# Patient Record
Sex: Male | Born: 1935
Health system: Southern US, Community
[De-identification: ages and names within clinical notes are randomized; demographics above are authoritative.]

## PROBLEM LIST (undated history)

## (undated) DIAGNOSIS — H8110 Benign paroxysmal vertigo, unspecified ear: Secondary | ICD-10-CM

## (undated) DIAGNOSIS — Z8719 Personal history of other diseases of the digestive system: Secondary | ICD-10-CM

## (undated) DIAGNOSIS — C801 Malignant (primary) neoplasm, unspecified: Secondary | ICD-10-CM

## (undated) DIAGNOSIS — E785 Hyperlipidemia, unspecified: Secondary | ICD-10-CM

## (undated) DIAGNOSIS — I10 Essential (primary) hypertension: Secondary | ICD-10-CM

## (undated) DIAGNOSIS — E119 Type 2 diabetes mellitus without complications: Secondary | ICD-10-CM

## (undated) DIAGNOSIS — K5909 Other constipation: Secondary | ICD-10-CM

## (undated) HISTORY — DX: Other constipation: K59.09

## (undated) HISTORY — PX: EYE SURGERY: SHX253

## (undated) HISTORY — PX: COLONOSCOPY: SHX174

## (undated) HISTORY — DX: Essential (primary) hypertension: I10

## (undated) HISTORY — PX: ESOPHAGOGASTRODUODENOSCOPY: SHX1529

## (undated) HISTORY — DX: Benign paroxysmal vertigo, unspecified ear: H81.10

## (undated) HISTORY — PX: CATARACT EXTRACTION, BILATERAL: SHX1313

## (undated) HISTORY — DX: Hyperlipidemia, unspecified: E78.5

## (undated) HISTORY — DX: Type 2 diabetes mellitus without complications: E11.9

---

## 1957-01-25 DIAGNOSIS — J9819 Other pulmonary collapse: Secondary | ICD-10-CM

## 1957-01-25 HISTORY — DX: Other pulmonary collapse: J98.19

## 2004-08-27 ENCOUNTER — Ambulatory Visit: Payer: Self-pay | Admitting: Family Medicine

## 2007-02-16 ENCOUNTER — Ambulatory Visit: Payer: Self-pay | Admitting: Family Medicine

## 2007-08-08 ENCOUNTER — Emergency Department: Payer: Self-pay | Admitting: Emergency Medicine

## 2008-05-22 ENCOUNTER — Ambulatory Visit: Payer: Self-pay | Admitting: Family Medicine

## 2009-09-19 ENCOUNTER — Ambulatory Visit: Payer: Self-pay | Admitting: Family Medicine

## 2012-03-16 ENCOUNTER — Ambulatory Visit: Payer: Self-pay | Admitting: Nurse Practitioner

## 2012-05-17 ENCOUNTER — Ambulatory Visit: Payer: Self-pay | Admitting: Gastroenterology

## 2012-09-16 ENCOUNTER — Emergency Department: Payer: Self-pay | Admitting: Emergency Medicine

## 2012-09-16 LAB — URINALYSIS, COMPLETE
Bacteria: NONE SEEN
Ketone: NEGATIVE
Leukocyte Esterase: NEGATIVE
Nitrite: NEGATIVE
Ph: 7 (ref 4.5–8.0)
Protein: NEGATIVE
RBC,UR: 3 /HPF (ref 0–5)
Specific Gravity: 1.005 (ref 1.003–1.030)
Squamous Epithelial: NONE SEEN
WBC UR: <1 /HPF (ref 0–5)

## 2012-09-16 LAB — COMPREHENSIVE METABOLIC PANEL
Albumin: 4.1 g/dL (ref 3.4–5.0)
Anion Gap: 6 — ABNORMAL LOW (ref 7–16)
BUN: 17 mg/dL (ref 7–18)
Bilirubin,Total: 0.7 mg/dL (ref 0.2–1.0)
Calcium, Total: 9.6 mg/dL (ref 8.5–10.1)
Chloride: 99 mmol/L (ref 98–107)
Co2: 32 mmol/L (ref 21–32)
Creatinine: 1.1 mg/dL (ref 0.60–1.30)
EGFR (African American): >60
Glucose: 163 mg/dL — ABNORMAL HIGH (ref 65–99)
Osmolality: 279 (ref 275–301)
Potassium: 3.7 mmol/L (ref 3.5–5.1)
SGOT(AST): 19 U/L (ref 15–37)
Sodium: 137 mmol/L (ref 136–145)
Total Protein: 7.9 g/dL (ref 6.4–8.2)

## 2012-09-16 LAB — CBC
HCT: 45 % (ref 40.0–52.0)
HGB: 15.7 g/dL (ref 13.0–18.0)
MCHC: 35 g/dL (ref 32.0–36.0)
MCV: 95 fL (ref 80–100)
Platelet: 130 10*3/uL — ABNORMAL LOW (ref 150–440)
RDW: 13.2 % (ref 11.5–14.5)
WBC: 7.6 10*3/uL (ref 3.8–10.6)

## 2012-09-16 LAB — LIPASE, BLOOD: Lipase: 153 U/L (ref 73–393)

## 2014-05-06 ENCOUNTER — Emergency Department
Admit: 2014-05-06 | Disposition: A | Discharge status: Home / Self Care | Payer: Self-pay | Admitting: Emergency Medicine

## 2014-05-06 DIAGNOSIS — F419 Anxiety disorder, unspecified: Secondary | ICD-10-CM | POA: Diagnosis not present

## 2014-05-06 DIAGNOSIS — I10 Essential (primary) hypertension: Secondary | ICD-10-CM | POA: Diagnosis not present

## 2014-05-06 DIAGNOSIS — K5641 Fecal impaction: Secondary | ICD-10-CM | POA: Diagnosis not present

## 2014-05-06 DIAGNOSIS — Z87891 Personal history of nicotine dependence: Secondary | ICD-10-CM | POA: Diagnosis not present

## 2014-05-06 DIAGNOSIS — Z79899 Other long term (current) drug therapy: Secondary | ICD-10-CM | POA: Diagnosis not present

## 2014-05-06 DIAGNOSIS — R000 Tachycardia, unspecified: Secondary | ICD-10-CM | POA: Diagnosis not present

## 2014-05-14 DIAGNOSIS — K5900 Constipation, unspecified: Secondary | ICD-10-CM | POA: Diagnosis not present

## 2014-05-14 DIAGNOSIS — E785 Hyperlipidemia, unspecified: Secondary | ICD-10-CM | POA: Diagnosis not present

## 2014-05-14 DIAGNOSIS — E119 Type 2 diabetes mellitus without complications: Secondary | ICD-10-CM | POA: Diagnosis not present

## 2014-05-14 DIAGNOSIS — R42 Dizziness and giddiness: Secondary | ICD-10-CM | POA: Diagnosis not present

## 2014-05-14 DIAGNOSIS — I10 Essential (primary) hypertension: Secondary | ICD-10-CM | POA: Diagnosis not present

## 2014-09-19 DIAGNOSIS — E785 Hyperlipidemia, unspecified: Secondary | ICD-10-CM | POA: Diagnosis not present

## 2014-09-19 DIAGNOSIS — E119 Type 2 diabetes mellitus without complications: Secondary | ICD-10-CM | POA: Diagnosis not present

## 2014-09-19 DIAGNOSIS — K5909 Other constipation: Secondary | ICD-10-CM | POA: Diagnosis not present

## 2014-09-19 DIAGNOSIS — Z0000 Encounter for general adult medical examination without abnormal findings: Secondary | ICD-10-CM | POA: Diagnosis not present

## 2015-01-15 DIAGNOSIS — E119 Type 2 diabetes mellitus without complications: Secondary | ICD-10-CM | POA: Diagnosis not present

## 2015-01-15 DIAGNOSIS — E785 Hyperlipidemia, unspecified: Secondary | ICD-10-CM | POA: Diagnosis not present

## 2015-01-15 DIAGNOSIS — Z125 Encounter for screening for malignant neoplasm of prostate: Secondary | ICD-10-CM | POA: Diagnosis not present

## 2015-01-22 DIAGNOSIS — I10 Essential (primary) hypertension: Secondary | ICD-10-CM | POA: Diagnosis not present

## 2015-01-22 DIAGNOSIS — E119 Type 2 diabetes mellitus without complications: Secondary | ICD-10-CM | POA: Diagnosis not present

## 2015-01-22 DIAGNOSIS — E785 Hyperlipidemia, unspecified: Secondary | ICD-10-CM | POA: Diagnosis not present

## 2015-01-22 DIAGNOSIS — K5909 Other constipation: Secondary | ICD-10-CM | POA: Diagnosis not present

## 2015-10-09 ENCOUNTER — Encounter: Payer: Self-pay | Admitting: Physical Therapy

## 2015-10-09 ENCOUNTER — Ambulatory Visit: Payer: Medicare HMO | Attending: Family Medicine | Admitting: Physical Therapy

## 2015-10-09 DIAGNOSIS — E119 Type 2 diabetes mellitus without complications: Secondary | ICD-10-CM | POA: Insufficient documentation

## 2015-10-09 DIAGNOSIS — R42 Dizziness and giddiness: Secondary | ICD-10-CM | POA: Insufficient documentation
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
## 2015-10-16 ENCOUNTER — Encounter: Payer: Self-pay | Admitting: Physical Therapy

## 2015-10-16 ENCOUNTER — Ambulatory Visit: Payer: Medicare HMO | Admitting: Physical Therapy

## 2015-10-16 DIAGNOSIS — R42 Dizziness and giddiness: Secondary | ICD-10-CM | POA: Diagnosis not present
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
## 2015-10-17 ENCOUNTER — Ambulatory Visit: Payer: Medicare HMO | Admitting: Physical Therapy

## 2015-10-24 ENCOUNTER — Encounter: Payer: Self-pay | Admitting: Physical Therapy

## 2015-10-24 ENCOUNTER — Ambulatory Visit: Payer: Medicare HMO | Admitting: Physical Therapy

## 2015-10-24 DIAGNOSIS — R42 Dizziness and giddiness: Secondary | ICD-10-CM
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
## 2015-10-31 ENCOUNTER — Ambulatory Visit: Payer: Medicare HMO | Attending: Family Medicine | Admitting: Physical Therapy

## 2015-10-31 ENCOUNTER — Encounter: Payer: Self-pay | Admitting: Physical Therapy

## 2015-10-31 DIAGNOSIS — R42 Dizziness and giddiness: Secondary | ICD-10-CM | POA: Diagnosis not present
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
## 2015-11-04 ENCOUNTER — Encounter: Payer: Self-pay | Admitting: Physical Therapy

## 2015-11-04 ENCOUNTER — Ambulatory Visit: Payer: Medicare HMO | Admitting: Physical Therapy

## 2015-11-04 DIAGNOSIS — R42 Dizziness and giddiness: Secondary | ICD-10-CM | POA: Diagnosis not present
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
## 2015-11-11 ENCOUNTER — Encounter: Payer: Medicare HMO | Admitting: Physical Therapy

## 2015-11-18 ENCOUNTER — Encounter: Payer: Medicare HMO | Admitting: Physical Therapy

## 2015-11-25 ENCOUNTER — Encounter: Payer: Medicare HMO | Admitting: Physical Therapy

## 2018-08-22 ENCOUNTER — Emergency Department
Admission: EM | Admit: 2018-08-22 | Discharge: 2018-08-22 | Disposition: A | Discharge status: Home / Self Care | Payer: Medicare HMO | Attending: Emergency Medicine | Admitting: Emergency Medicine

## 2018-08-22 ENCOUNTER — Encounter: Payer: Self-pay | Admitting: *Deleted

## 2018-08-22 ENCOUNTER — Other Ambulatory Visit: Payer: Self-pay

## 2018-08-22 DIAGNOSIS — Z87891 Personal history of nicotine dependence: Secondary | ICD-10-CM | POA: Diagnosis not present

## 2018-08-22 DIAGNOSIS — I10 Essential (primary) hypertension: Secondary | ICD-10-CM | POA: Diagnosis not present

## 2018-08-22 DIAGNOSIS — Z7982 Long term (current) use of aspirin: Secondary | ICD-10-CM | POA: Insufficient documentation

## 2018-08-22 DIAGNOSIS — R410 Disorientation, unspecified: Secondary | ICD-10-CM | POA: Diagnosis not present

## 2018-08-22 DIAGNOSIS — Z79899 Other long term (current) drug therapy: Secondary | ICD-10-CM | POA: Insufficient documentation

## 2018-08-22 DIAGNOSIS — E119 Type 2 diabetes mellitus without complications: Secondary | ICD-10-CM | POA: Diagnosis not present

## 2018-08-22 LAB — URINALYSIS, COMPLETE (UACMP) WITH MICROSCOPIC
Bacteria, UA: NONE SEEN
Bilirubin Urine: NEGATIVE
Glucose, UA: NEGATIVE mg/dL
Ketones, ur: NEGATIVE mg/dL
Leukocytes,Ua: NEGATIVE
Nitrite: NEGATIVE
Protein, ur: NEGATIVE mg/dL
Specific Gravity, Urine: 1.012 (ref 1.005–1.030)
Squamous Epithelial / HPF: NONE SEEN (ref 0–5)
pH: 6 (ref 5.0–8.0)

## 2018-08-22 LAB — COMPREHENSIVE METABOLIC PANEL
ALT: 19 U/L (ref 0–44)
AST: 21 U/L (ref 15–41)
Albumin: 4.2 g/dL (ref 3.5–5.0)
Alkaline Phosphatase: 71 U/L (ref 38–126)
Anion gap: 11 (ref 5–15)
BUN: 26 mg/dL — ABNORMAL HIGH (ref 8–23)
CO2: 29 mmol/L (ref 22–32)
Calcium: 9.3 mg/dL (ref 8.9–10.3)
Chloride: 98 mmol/L (ref 98–111)
Creatinine, Ser: 1.47 mg/dL — ABNORMAL HIGH (ref 0.61–1.24)
GFR calc Af Amer: 50 mL/min — ABNORMAL LOW (ref >60)
GFR calc non Af Amer: 43 mL/min — ABNORMAL LOW (ref >60)
Glucose, Bld: 128 mg/dL — ABNORMAL HIGH (ref 70–99)
Potassium: 3.7 mmol/L (ref 3.5–5.1)
Sodium: 138 mmol/L (ref 135–145)
Total Bilirubin: 0.5 mg/dL (ref 0.3–1.2)
Total Protein: 8 g/dL (ref 6.5–8.1)

## 2018-08-22 LAB — CBC
HCT: 43.8 % (ref 39.0–52.0)
Hemoglobin: 14.7 g/dL (ref 13.0–17.0)
MCH: 31.3 pg (ref 26.0–34.0)
MCHC: 33.6 g/dL (ref 30.0–36.0)
MCV: 93.2 fL (ref 80.0–100.0)
Platelets: 177 10*3/uL (ref 150–400)
RBC: 4.7 MIL/uL (ref 4.22–5.81)
RDW: 12.1 % (ref 11.5–15.5)
WBC: 6.9 10*3/uL (ref 4.0–10.5)
nRBC: 0 % (ref 0.0–0.2)

## 2018-08-22 LAB — LIPASE, BLOOD: Lipase: 24 U/L (ref 11–51)

## 2018-08-22 NOTE — ED Notes (Signed)
md at bedside to discuss discharge plan and follow up care.

## 2018-08-22 NOTE — Discharge Instructions (Addendum)
Please seek medical attention for any high fevers, chest pain, shortness of breath, change in behavior, persistent vomiting, bloody stool or any other new or concerning symptoms.  

## 2018-08-22 NOTE — ED Triage Notes (Signed)
Pt to ED reporting increased fatigue over the past 2 weeks. Per pts wife he has been sleeping most day and not eating well. Pt has been more confused than normal and forgetting more. Pt reporting he has been feeling like it is hard to get his words out appropriately for the past 2 weeks.   Pt also reporting new onset of right sided abd and rib pain. No NVD but pt reports constipation. Pt took a suppository this morning and had a small BM.

## 2018-08-22 NOTE — ED Notes (Signed)
Patient bought into ed for change in mental status, reports patient not sleeping at night, moving a lot and saying things that are inappropriate. Patient denies pain of sob. Awaiting md eval and plan of care.

## 2018-08-22 NOTE — ED Provider Notes (Signed)
[facility] Emergency Department Provider Note   ____________________________________________   I have reviewed the triage vital signs and the nursing notes.   HISTORY  Chief Complaint Confusion   History limited by and level 5 caveat due to: Confusion   HPI Andres Hebert is a 85 y.o. male who presents to the emergency department today because of concerns for increasing confusion.  Wife states that the confusion has been going on for 4 weeks.  Is gradually gotten worse.  She states that he will recall things that have been a long time ago confused.  No trauma.  No fevers.  Did see primary care doctor yesterday with normal blood work.  Records reviewed. Per medical record review patient has a history of diabetes, hyperlipidemia, hypertension  Past Medical History:  Diagnosis Date  . BPV (benign positional vertigo)   . Chronic constipation   . Diabetes mellitus without complication ([place])   . Hyperlipidemia   . Hypertension     Patient Active Problem List   Diagnosis Date Noted  . Diabetes mellitus type 2, uncomplicated ([place]) 16/10/9602    History reviewed. No pertinent surgical history.  Prior to Admission medications   Medication Sig Start Date End Date Taking? Authorizing Provider  amLODipine (NORVASC) 5 MG tablet Take 5 mg by mouth daily.    [provider]  aspirin EC 81 MG tablet Take 81 mg by mouth daily.    [provider]  potassium chloride (K-DUR,KLOR-CON) 10 MEQ tablet Take 10 mEq by mouth 2 (two) times daily.    [provider]  pravastatin (PRAVACHOL) 40 MG tablet Take 40 mg by mouth daily.    [provider]  triamterene-hydrochlorothiazide (MAXZIDE-25) 37.5-25 MG tablet Take 1 tablet by mouth daily.    [provider]    Allergies Accupril [quinapril hcl]  History reviewed. No pertinent family history.  Social History Social History   Tobacco Use  . Smoking status: Former Smoker     Packs/day: 1.00    Years: 25.00    Pack years: 25.00    Types: Cigarettes    Quit date: 1976    Years since quitting: 44.6  . Smokeless tobacco: Never Used  Substance Use Topics  . Alcohol use: Not on file  . Drug use: Not on file    Review of Systems Unable to obtain reliable review of symptoms from patient given confusion. ____________________________________________   PHYSICAL EXAM:  VITAL SIGNS: ED Triage Vitals  Enc Vitals Group     BP 08/22/18 1840 (!) 142/67     Pulse Rate 08/22/18 1840 73     Resp 08/22/18 1840 16     Temp 08/22/18 1840 98.5 F (36.9 C)     Temp Source 08/22/18 1840 Oral     SpO2 08/22/18 1840 96 %     Weight 08/22/18 1837 168 lb (76.2 kg)     Height 08/22/18 1837 5\' 6"  (1.676 m)     Head Circumference --      Peak Flow --      Pain Score 08/22/18 1836 7   Constitutional: Awake and alert, not oriented to events or situation Eyes: Conjunctivae are normal.  ENT      Head: Normocephalic and atraumatic.      Nose: No congestion/rhinnorhea.      Mouth/Throat: Mucous membranes are moist.      Neck: No stridor. Hematological/Lymphatic/Immunilogical: No cervical lymphadenopathy. Cardiovascular: Normal rate, regular rhythm.  No murmurs, rubs, or gallops.  Respiratory:  Normal respiratory effort without tachypnea nor retractions. Breath sounds are clear and equal bilaterally. No wheezes/rales/rhonchi. Gastrointestinal: Soft and non tender. No rebound. No guarding.  Genitourinary: Deferred Musculoskeletal: Normal range of motion in all extremities. No lower extremity edema. Neurologic: Not oriented to events or situation.  Moving all extremities. Skin:  Skin is warm, dry and intact. No rash noted. Psychiatric: Mood and affect are normal. Speech and behavior are normal. Patient exhibits appropriate insight and judgment.  ____________________________________________    LABS (pertinent positives/negatives)  Lipase 24 CMP wnl except glu 128, bun  26, cr 1.47 CBC wbc 6.9, hgb 14.7,   ____________________________________________   EKG  I, Nance Pear, attending physician, personally viewed and interpreted this EKG  EKG Time: 1935 Rate: 68 Rhythm: normal sinus rhythm Axis: left axis deviation Intervals: qtc 433 QRS: narrow ST changes: no st elevation Impression: abnormal ekg   ____________________________________________    RADIOLOGY  None  ____________________________________________   PROCEDURES  Procedures  ____________________________________________   INITIAL IMPRESSION / ASSESSMENT AND PLAN / ED COURSE  Pertinent labs & imaging results that were available during my care of the patient were reviewed by me and considered in my medical decision making (see chart for details).   Patient presented to the emergency department today because of concerns for increased confusion over the past 4 weeks.  Blood work and urine without concerning findings here.  I did have a discussion with the wife.  At this point I would have some concerns for possible dementia over delirium.  And discuss importance of primary care follow-up.  ____________________________________________   FINAL CLINICAL IMPRESSION(S) / ED DIAGNOSES  Final diagnoses:  Confusion     Note: This dictation was prepared with Dragon dictation. Any transcriptional errors that result from this process are unintentional     Nance Pear, MD 08/22/18 2316

## 2018-08-23 LAB — TROPONIN I (HIGH SENSITIVITY): Troponin I (High Sensitivity): 5 ng/L (ref <18)

## 2018-10-05 ENCOUNTER — Encounter: Payer: Self-pay | Admitting: Emergency Medicine

## 2018-10-05 ENCOUNTER — Emergency Department: Payer: Medicare HMO

## 2018-10-05 ENCOUNTER — Other Ambulatory Visit: Payer: Self-pay

## 2018-10-05 ENCOUNTER — Inpatient Hospital Stay
Admission: EM | Admit: 2018-10-05 | Discharge: 2018-10-08 | DRG: 180 | Disposition: A | Discharge status: Home / Self Care | Payer: Medicare HMO | Attending: Internal Medicine | Admitting: Internal Medicine

## 2018-10-05 DIAGNOSIS — I10 Essential (primary) hypertension: Secondary | ICD-10-CM | POA: Diagnosis present

## 2018-10-05 DIAGNOSIS — Z9181 History of falling: Secondary | ICD-10-CM

## 2018-10-05 DIAGNOSIS — Z8639 Personal history of other endocrine, nutritional and metabolic disease: Secondary | ICD-10-CM | POA: Diagnosis not present

## 2018-10-05 DIAGNOSIS — R441 Visual hallucinations: Secondary | ICD-10-CM | POA: Diagnosis present

## 2018-10-05 DIAGNOSIS — Z20828 Contact with and (suspected) exposure to other viral communicable diseases: Secondary | ICD-10-CM | POA: Diagnosis present

## 2018-10-05 DIAGNOSIS — Z823 Family history of stroke: Secondary | ICD-10-CM | POA: Diagnosis not present

## 2018-10-05 DIAGNOSIS — J90 Pleural effusion, not elsewhere classified: Secondary | ICD-10-CM

## 2018-10-05 DIAGNOSIS — Z888 Allergy status to other drugs, medicaments and biological substances status: Secondary | ICD-10-CM | POA: Diagnosis not present

## 2018-10-05 DIAGNOSIS — J9601 Acute respiratory failure with hypoxia: Secondary | ICD-10-CM

## 2018-10-05 DIAGNOSIS — Z9889 Other specified postprocedural states: Secondary | ICD-10-CM

## 2018-10-05 DIAGNOSIS — Z7982 Long term (current) use of aspirin: Secondary | ICD-10-CM | POA: Diagnosis not present

## 2018-10-05 DIAGNOSIS — Z79899 Other long term (current) drug therapy: Secondary | ICD-10-CM | POA: Diagnosis not present

## 2018-10-05 DIAGNOSIS — Z66 Do not resuscitate: Secondary | ICD-10-CM | POA: Diagnosis not present

## 2018-10-05 DIAGNOSIS — E785 Hyperlipidemia, unspecified: Secondary | ICD-10-CM | POA: Diagnosis present

## 2018-10-05 DIAGNOSIS — H8110 Benign paroxysmal vertigo, unspecified ear: Secondary | ICD-10-CM | POA: Diagnosis present

## 2018-10-05 DIAGNOSIS — Z87891 Personal history of nicotine dependence: Secondary | ICD-10-CM

## 2018-10-05 DIAGNOSIS — C3431 Malignant neoplasm of lower lobe, right bronchus or lung: Principal | ICD-10-CM | POA: Diagnosis present

## 2018-10-05 DIAGNOSIS — J910 Malignant pleural effusion: Secondary | ICD-10-CM | POA: Diagnosis present

## 2018-10-05 DIAGNOSIS — C782 Secondary malignant neoplasm of pleura: Secondary | ICD-10-CM | POA: Diagnosis not present

## 2018-10-05 DIAGNOSIS — C7951 Secondary malignant neoplasm of bone: Secondary | ICD-10-CM | POA: Diagnosis present

## 2018-10-05 DIAGNOSIS — Z8042 Family history of malignant neoplasm of prostate: Secondary | ICD-10-CM

## 2018-10-05 DIAGNOSIS — K5909 Other constipation: Secondary | ICD-10-CM | POA: Diagnosis present

## 2018-10-05 DIAGNOSIS — I672 Cerebral atherosclerosis: Secondary | ICD-10-CM | POA: Diagnosis present

## 2018-10-05 DIAGNOSIS — E876 Hypokalemia: Secondary | ICD-10-CM | POA: Diagnosis not present

## 2018-10-05 LAB — BASIC METABOLIC PANEL
Anion gap: 12 (ref 5–15)
BUN: 24 mg/dL — ABNORMAL HIGH (ref 8–23)
CO2: 29 mmol/L (ref 22–32)
Calcium: 9.7 mg/dL (ref 8.9–10.3)
Chloride: 98 mmol/L (ref 98–111)
Creatinine, Ser: 1.16 mg/dL (ref 0.61–1.24)
GFR calc Af Amer: >60 mL/min (ref >60)
GFR calc non Af Amer: 57 mL/min — ABNORMAL LOW (ref >60)
Glucose, Bld: 156 mg/dL — ABNORMAL HIGH (ref 70–99)
Potassium: 3.7 mmol/L (ref 3.5–5.1)
Sodium: 139 mmol/L (ref 135–145)

## 2018-10-05 LAB — SARS CORONAVIRUS 2 BY RT PCR (HOSPITAL ORDER, PERFORMED IN ~~LOC~~ HOSPITAL LAB): SARS Coronavirus 2: NEGATIVE

## 2018-10-05 LAB — TROPONIN I (HIGH SENSITIVITY)
Troponin I (High Sensitivity): 6 ng/L (ref <18)
Troponin I (High Sensitivity): 6 ng/L (ref <18)

## 2018-10-05 LAB — CBC
HCT: 45.6 % (ref 39.0–52.0)
Hemoglobin: 15 g/dL (ref 13.0–17.0)
MCH: 30.7 pg (ref 26.0–34.0)
MCHC: 32.9 g/dL (ref 30.0–36.0)
MCV: 93.3 fL (ref 80.0–100.0)
Platelets: 180 10*3/uL (ref 150–400)
RBC: 4.89 MIL/uL (ref 4.22–5.81)
RDW: 12.2 % (ref 11.5–15.5)
WBC: 8.9 10*3/uL (ref 4.0–10.5)
nRBC: 0 % (ref 0.0–0.2)

## 2018-10-05 MED ORDER — ONDANSETRON HCL 4 MG PO TABS: 4.0000 mg | ORAL_TABLET | Freq: Four times a day (QID) | ORAL | Status: DC | PRN

## 2018-10-05 MED ORDER — ACETAMINOPHEN 650 MG RE SUPP: 650.0000 mg | Freq: Four times a day (QID) | RECTAL | Status: DC | PRN

## 2018-10-05 MED ORDER — SODIUM CHLORIDE 0.9% FLUSH: 3.0000 mL | Freq: Once | INTRAVENOUS | Status: DC

## 2018-10-05 MED ORDER — ONDANSETRON HCL 4 MG/2ML IJ SOLN: 4.0000 mg | Freq: Four times a day (QID) | INTRAMUSCULAR | Status: DC | PRN

## 2018-10-05 MED ORDER — FENTANYL CITRATE (PF) 100 MCG/2ML IJ SOLN
50.0000 ug | Freq: Once | INTRAMUSCULAR | Status: AC
  Administered 2018-10-05: 50 ug via INTRAVENOUS
  Filled 2018-10-05: qty 2

## 2018-10-05 MED ORDER — AMLODIPINE BESYLATE 5 MG PO TABS
5.0000 mg | ORAL_TABLET | Freq: Every day | ORAL | Status: DC
  Administered 2018-10-06 – 2018-10-08 (×3): 5 mg via ORAL
  Filled 2018-10-05 (×3): qty 1

## 2018-10-05 MED ORDER — KETOROLAC TROMETHAMINE 30 MG/ML IJ SOLN
15.0000 mg | Freq: Four times a day (QID) | INTRAMUSCULAR | Status: DC | PRN
  Administered 2018-10-06 – 2018-10-07 (×3): 15 mg via INTRAVENOUS
  Filled 2018-10-05 (×3): qty 1

## 2018-10-05 MED ORDER — PRAVASTATIN SODIUM 20 MG PO TABS
40.0000 mg | ORAL_TABLET | Freq: Every day | ORAL | Status: DC
  Administered 2018-10-06 – 2018-10-07 (×2): 40 mg via ORAL
  Filled 2018-10-05 (×2): qty 2

## 2018-10-05 MED ORDER — MORPHINE SULFATE (PF) 2 MG/ML IV SOLN
2.0000 mg | INTRAVENOUS | Status: DC | PRN
  Administered 2018-10-06 – 2018-10-08 (×5): 2 mg via INTRAVENOUS
  Filled 2018-10-05 (×5): qty 1

## 2018-10-05 MED ORDER — IOHEXOL 350 MG/ML SOLN
75.0000 mL | Freq: Once | INTRAVENOUS | Status: AC | PRN
  Administered 2018-10-05: 21:00:00 75 mL via INTRAVENOUS

## 2018-10-05 MED ORDER — ENOXAPARIN SODIUM 40 MG/0.4ML ~~LOC~~ SOLN
40.0000 mg | SUBCUTANEOUS | Status: DC
  Administered 2018-10-06 – 2018-10-07 (×2): 40 mg via SUBCUTANEOUS
  Filled 2018-10-05 (×2): qty 0.4

## 2018-10-05 MED ORDER — ACETAMINOPHEN 325 MG PO TABS: 650.0000 mg | ORAL_TABLET | Freq: Four times a day (QID) | ORAL | Status: DC | PRN

## 2018-10-05 MED ORDER — POTASSIUM CHLORIDE CRYS ER 10 MEQ PO TBCR
10.0000 meq | EXTENDED_RELEASE_TABLET | Freq: Two times a day (BID) | ORAL | Status: DC
  Administered 2018-10-06 – 2018-10-08 (×5): 10 meq via ORAL
  Filled 2018-10-05 (×5): qty 1

## 2018-10-05 NOTE — ED Notes (Signed)
primedoc in with pt for admission.  

## 2018-10-05 NOTE — ED Notes (Signed)
Family in with pt.  Pt reports falling off the porch 2 weeks ago and falling on to the ground.  Pt has pain in right rib area.  Pt states worse sx for past week.  Pt has sob.  Pt placed on 4 liters oxygen nasal cannula.  Iv started and pain meds given.

## 2018-10-05 NOTE — ED Provider Notes (Signed)
[facility] Emergency Department Provider Note  ____________________________________________   First MD Initiated Contact with Patient 10/05/18 1955     (approximate)  I have reviewed the triage vital signs and the nursing notes.   HISTORY  Chief Complaint SOB   HPI Andres Hebert is a 85 y.o. male with diabetes, HTN, HLD presents with shortness of breath.  Patient had a fall 2 weeks ago that was mechanical in nature.  Patient denies any pain initially.  However a few days ago he developed pain on his right side of his flank region and increased shortness of breath.  Shortness of breath, constant, nothing makes it better, nothing makes it worse.          Past Medical History:  Diagnosis Date  . BPV (benign positional vertigo)   . Chronic constipation   . Diabetes mellitus without complication ([place])   . Hyperlipidemia   . Hypertension     Patient Active Problem List   Diagnosis Date Noted  . Diabetes mellitus type 2, uncomplicated ([place]) 23/55/7322    History reviewed. No pertinent surgical history.  Prior to Admission medications   Medication Sig Start Date End Date Taking? Authorizing Provider  amLODipine (NORVASC) 5 MG tablet Take 5 mg by mouth daily.    [provider]  aspirin EC 81 MG tablet Take 81 mg by mouth daily.    [provider]  potassium chloride (K-DUR,KLOR-CON) 10 MEQ tablet Take 10 mEq by mouth 2 (two) times daily.    [provider]  pravastatin (PRAVACHOL) 40 MG tablet Take 40 mg by mouth daily.    [provider]  triamterene-hydrochlorothiazide (MAXZIDE-25) 37.5-25 MG tablet Take 1 tablet by mouth daily.    [provider]    Allergies Accupril [quinapril hcl]  No family history on file.  Social History Social History   Tobacco Use  . Smoking status: Former Smoker    Packs/day: 1.00    Years: 25.00    Pack years: 25.00    Types: Cigarettes    Quit date: 1976   Years since quitting: 44.7  . Smokeless tobacco: Never Used  Substance Use Topics  . Alcohol use: Not on file  . Drug use: Not on file      Review of Systems Constitutional: No fever/chills Eyes: No visual changes. ENT: No sore throat. Cardiovascular: No chest pain Respiratory: Positive for SOB, positive right rib pain Gastrointestinal: No abdominal pain.  No nausea, no vomiting.  No diarrhea.  No constipation. Genitourinary: Negative for dysuria. Musculoskeletal: Negative for back pain. Skin: Negative for rash. Neurological: Negative for headaches, focal weakness or numbness. All other ROS negative ____________________________________________   PHYSICAL EXAM:  VITAL SIGNS: ED Triage Vitals  Enc Vitals Group     BP 10/05/18 1816 123/81     Pulse Rate 10/05/18 1816 98     Resp 10/05/18 1816 (!) 24     Temp 10/05/18 1816 98.5 F (36.9 C)     Temp Source 10/05/18 1816 Oral     SpO2 10/05/18 1816 92 %     Weight --      Height 10/05/18 1816 5\' 6"  (1.676 m)     Head Circumference --      Peak Flow --      Pain Score 10/05/18 1819 8     Pain Loc --      Pain Edu? --      Excl. in [place]? --     Constitutional:  Alert and oriented. Well appearing and in no acute distress. Eyes: Conjunctivae are normal. EOMI. Head: Atraumatic. Nose: No congestion/rhinnorhea. Mouth/Throat: Mucous membranes are moist.   Neck: No stridor. Trachea Midline. FROM Cardiovascular: Normal rate, regular rhythm. Grossly normal heart sounds.  Good peripheral circulation. Respiratory: Increased work of breathing, bilateral lung sounds Gastrointestinal: Soft and nontender. No distention. No abdominal bruits.  Musculoskeletal: No lower extremity tenderness nor edema.  No joint effusions. Neurologic:  Normal speech and language. No gross focal neurologic deficits are appreciated.  Skin:  Skin is warm, dry and intact. No rash noted. Psychiatric: Mood and affect are normal. Speech and behavior are normal.  GU: Deferred   ____________________________________________   LABS (all labs ordered are listed, but only abnormal results are displayed)  Labs Reviewed  BASIC METABOLIC PANEL - Abnormal; Notable for the following components:      Result Value   Glucose, Bld 156 (*)    BUN 24 (*)    GFR calc non Af Amer 57 (*)    All other components within normal limits  SARS CORONAVIRUS 2 (HOSPITAL ORDER, [facility])  CBC  TROPONIN I (HIGH SENSITIVITY)  TROPONIN I (HIGH SENSITIVITY)   ____________________________________________   ED ECG REPORT I, Vanessa [place], the attending physician, personally viewed and interpreted this ECG.  EKG is normal sinus rate of 98, little ST elevation in aVR and V1, this looks new from prior, no t wave inversion, normal intervals. ____________________________________________  [place], personally viewed and evaluated these images (plain radiographs) as part of my medical decision making, as well as reviewing the written report by the radiologist.  ED MD interpretation: Small pleural effusion on the right  Official radiology report(s): Dg Chest 2 View  Result Date: 10/05/2018 CLINICAL DATA:  Shortness of breath EXAM: CHEST - 2 VIEW COMPARISON:  None. FINDINGS: Small right pleural effusion with lateral loculated component versus pleural nodularity. The lungs are hyperexpanded with increased interstitial markings. Heart size is normal. There is calcific aortic atherosclerosis. IMPRESSION: Small right pleural effusion with lateral loculated component versus pleural nodularity. In the setting of recent trauma, this may indicate nondisplaced or minimally displaced rib fractures with a small hemothorax. CT may be helpful for further investigation. Electronically Signed   By: Ulyses Jarred M.D.   On: 10/05/2018 19:10    ____________________________________________   PROCEDURES  Procedure(s) performed (including Critical  Care):  .Critical Care Performed by: Vanessa [place], MD Authorized by: Vanessa , MD   Critical care provider statement:    Critical care time (minutes):  30   Critical care was necessary to treat or prevent imminent or life-threatening deterioration of the following conditions:  Respiratory failure   Critical care was time spent personally by me on the following activities:  Discussions with consultants, evaluation of patient's response to treatment, examination of patient, ordering and performing treatments and interventions, ordering and review of laboratory studies, ordering and review of radiographic studies, pulse oximetry, re-evaluation of patient's condition, obtaining history from patient or surrogate and review of old charts     ____________________________________________   INITIAL IMPRESSION / ASSESSMENT AND PLAN / ED COURSE   Andres Hebert was evaluated in Emergency Department on 10/05/2018 for the symptoms described in the history of present illness. He was evaluated in the context of the global COVID-19 pandemic, which necessitated consideration that the patient might be at risk for infection with the SARS-CoV-2 virus that causes COVID-19. Institutional protocols  and algorithms that pertain to the evaluation of patients at risk for COVID-19 are in a state of rapid change based on information released by regulatory bodies including the CDC and federal and state organizations. These policies and algorithms were followed during the patient's care in the ED.     Pt presents with SOB. Differential includes: PNA-will get xray to evaluation Anemia-CBC to evaluate ACS- will get trops Arrhythmia-Will get EKG and keep on monitor.  COVID- will get testing per algorithm.  Given patient's new pleural effusion will get CT scan to evaluate for pulmonary embolism, rib fractures, hemothorax.  CT is concerning for new right lower lobe mass consistent with lung cancer with a large  right pleural effusion there is some destructive lytic lesions on the ribs.  No evidence of pulmonary embolism.  Looks more comfortable with the pain medication.  Patient is on 2 L.  We will discussed the hospital team for admission.    ____________________________________________   FINAL CLINICAL IMPRESSION(S) / ED DIAGNOSES   Final diagnoses:  Acute respiratory failure with hypoxia ([place])     MEDICATIONS GIVEN DURING THIS VISIT:  Medications  sodium chloride flush (NS) 0.9 % injection 3 mL (has no administration in time range)  fentaNYL (SUBLIMAZE) injection 50 mcg (50 mcg Intravenous Given 10/05/18 2044)  iohexol (OMNIPAQUE) 350 MG/ML injection 75 mL (75 mLs Intravenous Contrast Given 10/05/18 2052)     ED Discharge Orders    None       Note:  This document was prepared using Dragon voice recognition software and may include unintentional dictation errors.   Vanessa [place], MD 10/05/18 2124

## 2018-10-05 NOTE — Progress Notes (Signed)
   [place] [facility] Day: 0 days Andres Hebert is a 85 y.o. male  With known history of essential hypertension, hyperlipidemia, diabetes, chronic constipation, history of benign positional vertigo who presents to the hospital complaining of right-sided chest pain along with some shortness of breath.  Patient CT chest was highly suggestive of a lung mass with a right-sided pleural effusion and lytic rib lesions which is consistent with suspected malignancy.  Given patient's advanced age and multiple comorbidities I had a discussion with the patient and patient's wife at bedside about difference between full code and DNR.  Patient would like to further discuss with his wife regarding his CODE STATUS.  For now patient remains a full code.  Patient would benefit from palliative care consult.  Advance care planning discussed with patient  with additional Family at bedside. All questions in regards to overall condition and expected prognosis answered. The decision was made to continue current code status  CODE STATUS: full Time spent: 16 minutes

## 2018-10-05 NOTE — ED Notes (Signed)
Patient transported to CT 

## 2018-10-05 NOTE — H&P (Signed)
[facility] [place] NAME: Andres Hebert    MR#:  [id number]  DATE OF BIRTH:  12-09-1931  DATE OF ADMISSION:  10/05/2018  PRIMARY CARE PHYSICIAN: [name], MD   REQUESTING/REFERRING PHYSICIAN: Dr. Marjean Donna  CHIEF COMPLAINT:  No chief complaint on file. Right sided pleuritic Chest pain. Shortness of breath.   HISTORY OF PRESENT ILLNESS:  Andres Hebert  is a 85 y.o. male with a known history of essential hypertension, hyperlipidemia, diabetes, chronic constipation, history of benign positional vertigo who presents to the hospital complaining of right-sided chest pain along with some shortness of breath.  Patient says she has developed worsening pain on the right side of his chest over the past few days to a week progressively getting worse.  He also admits to worsening exertional dyspnea.  He also admits to a 20 pound weight loss over the past 3 months and admits to intermittent hemoptysis.  He presents to the hospital as his symptoms were not improving and underwent a CT of the chest which showed a right-sided lung mass, with mets to the ribs with some lytic lesions and also noted to have extensive right-sided pleural effusion. Patient denies any fevers, chills, nausea, vomiting, abdominal pain, melena, hematochezia dysuria hematuria or any other associated symptoms.  Given his abnormal CT scan findings hospital services were contacted for admission.  PAST MEDICAL HISTORY:   Past Medical History:  Diagnosis Date  . BPV (benign positional vertigo)   . Chronic constipation   . Diabetes mellitus without complication ([place])   . Hyperlipidemia   . Hypertension     PAST SURGICAL HISTORY:  History reviewed. No pertinent surgical history.  SOCIAL HISTORY:   Social History   Tobacco Use  . Smoking status: Former Smoker    Packs/day: 1.00    Years: 25.00    Pack years: 25.00    Types: Cigarettes    Quit date: 1976    Years since  quitting: 44.7  . Smokeless tobacco: Never Used  Substance Use Topics  . Alcohol use: Not Currently    FAMILY HISTORY:   Family History  Problem Relation Age of Onset  . Stroke Mother   . Prostate cancer Father     DRUG ALLERGIES:   Allergies  Allergen Reactions  . Accupril [Quinapril Hcl] Other (See Comments)    Reaction:dizziness per MR    REVIEW OF SYSTEMS:   Review of Systems  Constitutional: Positive for weight loss (20 lbs. ). Negative for fever.  HENT: Negative for congestion, nosebleeds and tinnitus.   Eyes: Negative for blurred vision, double vision and redness.  Respiratory: Positive for shortness of breath. Negative for cough and hemoptysis.   Cardiovascular: Positive for chest pain (Pleuritic on right side. ). Negative for orthopnea, leg swelling and PND.  Gastrointestinal: Negative for abdominal pain, diarrhea, melena, nausea and vomiting.  Genitourinary: Negative for dysuria, hematuria and urgency.  Musculoskeletal: Negative for falls and joint pain.  Neurological: Negative for dizziness, tingling, sensory change, focal weakness, seizures, weakness and headaches.  Endo/Heme/Allergies: Negative for polydipsia. Does not bruise/bleed easily.  Psychiatric/Behavioral: Negative for depression and memory loss. The patient is not nervous/anxious.     MEDICATIONS AT HOME:   Prior to Admission medications   Medication Sig Start Date End Date Taking? Authorizing Provider  amLODipine (NORVASC) 5 MG tablet Take 5 mg by mouth daily.    [provider]  aspirin EC 81 MG tablet Take 81 mg by mouth  daily.    [provider]  potassium chloride (K-DUR,KLOR-CON) 10 MEQ tablet Take 10 mEq by mouth 2 (two) times daily.    [provider]  pravastatin (PRAVACHOL) 40 MG tablet Take 40 mg by mouth daily.    [provider]  triamterene-hydrochlorothiazide (MAXZIDE-25) 37.5-25 MG tablet Take 1 tablet by mouth daily.    [provider]       VITAL SIGNS:  Blood pressure (!) 122/39, pulse 88, temperature 98.5 F (36.9 C), temperature source Oral, resp. rate (!) 24, height 5\' 6"  (1.676 m), SpO2 98 %.  PHYSICAL EXAMINATION:  Physical Exam  GENERAL:  85 y.o.-year-old patient lying in the bed in no acute distress.  EYES: Pupils equal, round, reactive to light and accommodation. No scleral icterus. Extraocular muscles intact.  HEENT: Head atraumatic, normocephalic. Oropharynx and nasopharynx clear. No oropharyngeal erythema, moist oral mucosa  NECK:  Supple, no jugular venous distention. No thyroid enlargement, no tenderness.  LUNGS: Prolonged inspiratory and expiratory phase, poor air entry on the right side, no rails, minimal end expiratory wheezing bilaterally, negative use of accessory muscles. CARDIOVASCULAR: S1, S2 RRR. No murmurs, rubs, gallops, clicks.  ABDOMEN: Soft, nontender, nondistended. Bowel sounds present. No organomegaly or mass.  EXTREMITIES: No pedal edema, cyanosis, or clubbing. + 2 pedal & radial pulses b/l.   NEUROLOGIC: Cranial nerves II through XII are intact. No focal Motor or sensory deficits appreciated b/l. Globally weak.  PSYCHIATRIC: The patient is alert and oriented x 3.  SKIN: No obvious rash, lesion, or ulcer.   LABORATORY PANEL:   CBC Recent Labs  Lab 10/05/18 1821  WBC 8.9  HGB 15.0  HCT 45.6  PLT 180   ------------------------------------------------------------------------------------------------------------------  Chemistries  Recent Labs  Lab 10/05/18 1821  NA 139  K 3.7  CL 98  CO2 29  GLUCOSE 156*  BUN 24*  CREATININE 1.16  CALCIUM 9.7   ------------------------------------------------------------------------------------------------------------------  Cardiac Enzymes No results for input(s): TROPONINI in the last 168 hours. ------------------------------------------------------------------------------------------------------------------  RADIOLOGY:  Dg Chest 2  View  Result Date: 10/05/2018 CLINICAL DATA:  Shortness of breath EXAM: CHEST - 2 VIEW COMPARISON:  None. FINDINGS: Small right pleural effusion with lateral loculated component versus pleural nodularity. The lungs are hyperexpanded with increased interstitial markings. Heart size is normal. There is calcific aortic atherosclerosis. IMPRESSION: Small right pleural effusion with lateral loculated component versus pleural nodularity. In the setting of recent trauma, this may indicate nondisplaced or minimally displaced rib fractures with a small hemothorax. CT may be helpful for further investigation. Electronically Signed   By: Ulyses Jarred M.D.   On: 10/05/2018 19:10   Ct Angio Chest Pe W And/or Wo Contrast  Result Date: 10/05/2018 CLINICAL DATA:  Chest pain EXAM: CT ANGIOGRAPHY CHEST WITH CONTRAST TECHNIQUE: Multidetector CT imaging of the chest was performed using the standard protocol during bolus administration of intravenous contrast. Multiplanar CT image reconstructions and MIPs were obtained to evaluate the vascular anatomy. CONTRAST:  70mL OMNIPAQUE IOHEXOL 350 MG/ML SOLN COMPARISON:  Chest x-ray today FINDINGS: Cardiovascular: No filling defects in the pulmonary arteries to suggest pulmonary emboli. Heart is normal size. Aorta is normal caliber. Coronary artery and aortic atherosclerosis. Mediastinum/Nodes: Enlarged right hilar lymph nodes with index node having a short axis diameter of 10 mm. No mediastinal, left hilar or axillary adenopathy. Lungs/Pleura: There is concern for right lower lobe mass measuring approximately 8.4 x 6.9 cm. Associated large right joint effusion with pleural nodules concerning for metastatic pleural involvement. Compressive atelectasis in  the right lower lobe. No focal opacity on the left. Moderate to advanced emphysema. Upper Abdomen: Imaging into the upper abdomen shows no acute findings. Musculoskeletal: Destructive mass noted in the lateral right 7th rib. Bone  destruction also in the lateral right 8th rib. Diffuse degenerative changes in the thoracic spine. Review of the MIP images confirms the above findings. IMPRESSION: Large right lower lobe mass measures up to 8.4 cm most compatible with lung cancer. This is associated with numerous pleural nodules, large right pleural effusion compatible with pleural metastatic disease. There also bony destructive lytic lesions in the right lateral 7th and 8th ribs. Moderate to advanced emphysema. No evidence of pulmonary embolus. Aortic Atherosclerosis (ICD10-I70.0) and Emphysema (ICD10-J43.9). Electronically Signed   By: Rolm Baptise M.D.   On: 10/05/2018 21:10     IMPRESSION AND PLAN:   85 y.o. male with a known history of essential hypertension, hyperlipidemia, diabetes, chronic constipation, history of benign positional vertigo who presents to the hospital complaining of right-sided chest pain along with some shortness of breath.  1.  Acute respiratory failure with hypoxia-secondary to the lung mass with a large right-sided pleural effusion is seen. - Continue O2 supplementation, will plan for a right-sided therapeutic and diagnostic thoracentesis for tomorrow.  2.  Pleural effusion-source of patient's worsening shortness of breath and hypoxia. - Suspect this is a malignant pleural effusion given the patient's right lung mass and lytic lesions on the right lateral ribs. -I have ordered a diagnostic and therapeutic thoracentesis for tomorrow.  3.  Chest pain- this is on the right side and pleuritic in nature secondary to the metastatic rib lesions. -Continue supportive care with pain control with IV morphine, IV Toradol.  4.  Lung mass- noted on the CT chest as mentioned.  Suspicious to be lung cancer given patient's intermittent hemoptysis weight loss and history of tobacco abuse. - Await cytology from the right-sided pleural effusion.  We will also get a oncology consult and sent message to Dr. Mike Gip by  Raeanne Gathers.  5.  Essential hypertension-continue Norvasc  6.  Hyperlipidemia-continue Pravachol.    All the records are reviewed and case discussed with ED provider. Management plans discussed with the patient, family and they are in agreement.  CODE STATUS: Full code  TOTAL TIME TAKING CARE OF THIS PATIENT: 45 minutes.    Henreitta Leber M.D on 10/05/2018 at 9:56 PM  Between 7am to 6pm - Pager - [phone]  After 6pm go to [email or web address] - password [email or web address] [facility]  [facility]  Office  [phone]  CC: Primary care physician; [name], MD

## 2018-10-05 NOTE — ED Triage Notes (Signed)
PT c/o intermit SOB x2-3wks, states he had mechanical fall x2wks ago , c/o RT sided rib cage pain. Pt c/o pain with breathing. Pt 92% on RA, placed on 2L for comfort

## 2018-10-05 NOTE — ED Notes (Signed)
Report called to brandy rn floor nurse

## 2018-10-05 NOTE — ED Notes (Signed)
ED TO INPATIENT HANDOFF REPORT  ED Nurse Name and Phone #: Dineen Conradt  S Name/Age/Gender Andres Hebert 85 y.o. male Room/Bed: ED02A/ED02A  Code Status   Code Status: Not on file  Home/SNF/Other Home Patient oriented to: self, place, time and situation Is this baseline? Yes   Triage Complete: Triage complete  Chief Complaint Shob   Triage Note PT c/o intermit SOB x2-3wks, states he had mechanical fall x2wks ago , c/o RT sided rib cage pain. Pt c/o pain with breathing. Pt 92% on RA, placed on 2L for comfort    Allergies Allergies  Allergen Reactions  . Accupril [Quinapril Hcl] Other (See Comments)    Reaction:dizziness per MR    Level of Care/Admitting Diagnosis ED Disposition    ED Disposition Condition [facility]: [facility] [[id number]]  Level of Care: Med-Surg [16]  Covid Evaluation: Asymptomatic Screening Protocol (No Symptoms)  Diagnosis: Pleural effusion [242230]  Admitting Physician: Henreitta Leber [[id number]]  Attending Physician: Henreitta Leber [[id number]]  Estimated length of stay: past midnight tomorrow  Certification:: I certify this patient will need inpatient services for at least 2 midnights  PT Class (Do Not Modify): Inpatient [101]  PT Acc Code (Do Not Modify): Private [1]       B Medical/Surgery History Past Medical History:  Diagnosis Date  . BPV (benign positional vertigo)   . Chronic constipation   . Diabetes mellitus without complication ([place])   . Hyperlipidemia   . Hypertension    History reviewed. No pertinent surgical history.   A IV Location/Drains/Wounds Patient Lines/Drains/Airways Status   Active Line/Drains/Airways    Name:   Placement date:   Placement time:   Site:   Days:   Peripheral IV 10/05/18 Right Forearm   10/05/18    2041    Forearm   less than 1          Intake/Output Last 24 hours No intake or output data in the 24 hours ending 10/05/18 2310  Labs/Imaging Results for  orders placed or performed during the hospital encounter of 10/05/18 (from the past 48 hour(s))  Basic metabolic panel     Status: Abnormal   Collection Time: 10/05/18  6:21 PM  Result Value Ref Range   Sodium 139 135 - 145 mmol/L   Potassium 3.7 3.5 - 5.1 mmol/L   Chloride 98 98 - 111 mmol/L   CO2 29 22 - 32 mmol/L   Glucose, Bld 156 (H) 70 - 99 mg/dL   BUN 24 (H) 8 - 23 mg/dL   Creatinine, Ser 1.16 0.61 - 1.24 mg/dL   Calcium 9.7 8.9 - 10.3 mg/dL   GFR calc non Af Amer 57 (L) >60 mL/min   GFR calc Af Amer >60 >60 mL/min   Anion gap 12 5 - 15    Comment: Performed at [facility], [address] [place], [place] [address]  CBC     Status: None   Collection Time: 10/05/18  6:21 PM  Result Value Ref Range   WBC 8.9 4.0 - 10.5 K/uL   RBC 4.89 4.22 - 5.81 MIL/uL   Hemoglobin 15.0 13.0 - 17.0 g/dL   HCT 45.6 39.0 - 52.0 %   MCV 93.3 80.0 - 100.0 fL   MCH 30.7 26.0 - 34.0 pg   MCHC 32.9 30.0 - 36.0 g/dL   RDW 12.2 11.5 - 15.5 %   Platelets 180 150 - 400 K/uL   nRBC 0.0 0.0 -  0.2 %    Comment: Performed at [facility], [place], [place] [address]  Troponin I (High Sensitivity)     Status: None   Collection Time: 10/05/18  6:21 PM  Result Value Ref Range   Troponin I (High Sensitivity) 6 <18 ng/L    Comment: (NOTE) Elevated high sensitivity troponin I (hsTnI) values and significant  changes across serial measurements may suggest ACS but many other  chronic and acute conditions are known to elevate hsTnI results.  Refer to the "Links" section for chest pain algorithms and additional  guidance. Performed at [facility], [address] [place], [place] [address]   SARS Coronavirus 2 [facility] order, Performed in [facility] hospital lab) Nasopharyngeal Nasopharyngeal Swab     Status: None   Collection Time: 10/05/18  7:59 PM   Specimen: Nasopharyngeal Swab  Result Value Ref Range   SARS Coronavirus 2 NEGATIVE NEGATIVE    Comment:  (NOTE) If result is NEGATIVE SARS-CoV-2 target nucleic acids are NOT DETECTED. The SARS-CoV-2 RNA is generally detectable in upper and lower  respiratory specimens during the acute phase of infection. The lowest  concentration of SARS-CoV-2 viral copies this assay can detect is 250  copies / mL. A negative result does not preclude SARS-CoV-2 infection  and should not be used as the sole basis for treatment or other  patient management decisions.  A negative result may occur with  improper specimen collection / handling, submission of specimen other  than nasopharyngeal swab, presence of viral mutation(s) within the  areas targeted by this assay, and inadequate number of viral copies  (<250 copies / mL). A negative result must be combined with clinical  observations, patient history, and epidemiological information. If result is POSITIVE SARS-CoV-2 target nucleic acids are DETECTED. The SARS-CoV-2 RNA is generally detectable in upper and lower  respiratory specimens dur ing the acute phase of infection.  Positive  results are indicative of active infection with SARS-CoV-2.  Clinical  correlation with patient history and other diagnostic information is  necessary to determine patient infection status.  Positive results do  not rule out bacterial infection or co-infection with other viruses. If result is PRESUMPTIVE POSTIVE SARS-CoV-2 nucleic acids MAY BE PRESENT.   A presumptive positive result was obtained on the submitted specimen  and confirmed on repeat testing.  While 2019 novel coronavirus  (SARS-CoV-2) nucleic acids may be present in the submitted sample  additional confirmatory testing may be necessary for epidemiological  and / or clinical management purposes  to differentiate between  SARS-CoV-2 and other Sarbecovirus currently known to infect humans.  If clinically indicated additional testing with an alternate test  methodology [phone]) is advised. The SARS-CoV-2 RNA is  generally  detectable in upper and lower respiratory sp ecimens during the acute  phase of infection. The expected result is Negative. Fact Sheet for Patients:  StrictlyIdeas.no Fact Sheet for Healthcare Providers: BankingDealers.co.za This test is not yet approved or cleared by the [place] FDA and has been authorized for detection and/or diagnosis of SARS-CoV-2 by FDA under an Emergency Use Authorization (EUA).  This EUA will remain in effect (meaning this test can be used) for the duration of the COVID-19 declaration under Section 564(b)(1) of the Act, 21 U.S.C. section 360bbb-3(b)(1), unless the authorization is terminated or revoked sooner. Performed at [facility], [address], [place], [place] [address]   Troponin I (High Sensitivity)     Status: None   Collection Time:  10/05/18  8:21 PM  Result Value Ref Range   Troponin I (High Sensitivity) 6 <18 ng/L    Comment: (NOTE) Elevated high sensitivity troponin I (hsTnI) values and significant  changes across serial measurements may suggest ACS but many other  chronic and acute conditions are known to elevate hsTnI results.  Refer to the "Links" section for chest pain algorithms and additional  guidance. Performed at [facility], [address] [place], [place] [address]    Dg Chest 2 View  Result Date: 10/05/2018 CLINICAL DATA:  Shortness of breath EXAM: CHEST - 2 VIEW COMPARISON:  None. FINDINGS: Small right pleural effusion with lateral loculated component versus pleural nodularity. The lungs are hyperexpanded with increased interstitial markings. Heart size is normal. There is calcific aortic atherosclerosis. IMPRESSION: Small right pleural effusion with lateral loculated component versus pleural nodularity. In the setting of recent trauma, this may indicate nondisplaced or minimally displaced rib fractures with a small hemothorax. CT may be helpful  for further investigation. Electronically Signed   By: Ulyses Jarred M.D.   On: 10/05/2018 19:10   Ct Angio Chest Pe W And/or Wo Contrast  Result Date: 10/05/2018 CLINICAL DATA:  Chest pain EXAM: CT ANGIOGRAPHY CHEST WITH CONTRAST TECHNIQUE: Multidetector CT imaging of the chest was performed using the standard protocol during bolus administration of intravenous contrast. Multiplanar CT image reconstructions and MIPs were obtained to evaluate the vascular anatomy. CONTRAST:  17mL OMNIPAQUE IOHEXOL 350 MG/ML SOLN COMPARISON:  Chest x-ray today FINDINGS: Cardiovascular: No filling defects in the pulmonary arteries to suggest pulmonary emboli. Heart is normal size. Aorta is normal caliber. Coronary artery and aortic atherosclerosis. Mediastinum/Nodes: Enlarged right hilar lymph nodes with index node having a short axis diameter of 10 mm. No mediastinal, left hilar or axillary adenopathy. Lungs/Pleura: There is concern for right lower lobe mass measuring approximately 8.4 x 6.9 cm. Associated large right joint effusion with pleural nodules concerning for metastatic pleural involvement. Compressive atelectasis in the right lower lobe. No focal opacity on the left. Moderate to advanced emphysema. Upper Abdomen: Imaging into the upper abdomen shows no acute findings. Musculoskeletal: Destructive mass noted in the lateral right 7th rib. Bone destruction also in the lateral right 8th rib. Diffuse degenerative changes in the thoracic spine. Review of the MIP images confirms the above findings. IMPRESSION: Large right lower lobe mass measures up to 8.4 cm most compatible with lung cancer. This is associated with numerous pleural nodules, large right pleural effusion compatible with pleural metastatic disease. There also bony destructive lytic lesions in the right lateral 7th and 8th ribs. Moderate to advanced emphysema. No evidence of pulmonary embolus. Aortic Atherosclerosis (ICD10-I70.0) and Emphysema (ICD10-J43.9).  Electronically Signed   By: Rolm Baptise M.D.   On: 10/05/2018 21:10    Pending Labs [employer] (From admission, onward)    Start     Ordered   Signed and Occupational hygienist morning,   R     Signed and Held   Signed and Held  CBC  Tomorrow morning,   R     Signed and Held   Signed and Held  CBC  (enoxaparin (LOVENOX)    CrCl >/= 30 ml/min)  Once,   R    Comments: Baseline for enoxaparin therapy IF NOT ALREADY DRAWN.  Notify MD if PLT < 100 K.    Signed and Held   Signed and Held  Creatinine, serum  (enoxaparin (LOVENOX)    CrCl >/= 30  ml/min)  Once,   R    Comments: Baseline for enoxaparin therapy IF NOT ALREADY DRAWN.    Signed and Held   Signed and Held  Creatinine, serum  (enoxaparin (LOVENOX)    CrCl >/= 30 ml/min)  Weekly,   R    Comments: while on enoxaparin therapy    Signed and Held          Vitals/Pain Today's Vitals   10/05/18 2130 10/05/18 2200 10/05/18 2215 10/05/18 2230  BP: (!) 122/39 137/83  (!) 142/75  Pulse: 88 87 88   Resp: (!) 24 (!) 23 (!) 28 (!) 25  Temp:      TempSrc:      SpO2: 98% 100% 99%   Height:      PainSc:        Isolation Precautions No active isolations  Medications Medications  sodium chloride flush (NS) 0.9 % injection 3 mL (has no administration in time range)  fentaNYL (SUBLIMAZE) injection 50 mcg (50 mcg Intravenous Given 10/05/18 2044)  iohexol (OMNIPAQUE) 350 MG/ML injection 75 mL (75 mLs Intravenous Contrast Given 10/05/18 2052)    Mobility walks Low fall risk   Focused Assessments Cardiac Assessment Handoff:    No results found for: CKTOTAL, CKMB, CKMBINDEX, TROPONINI No results found for: DDIMER Does the Patient currently have chest pain? No      R Recommendations: See Admitting Provider Note  Report given to:   Additional Notes: none

## 2018-10-06 ENCOUNTER — Inpatient Hospital Stay: Payer: Medicare HMO

## 2018-10-06 LAB — BASIC METABOLIC PANEL
Anion gap: 9 (ref 5–15)
BUN: 23 mg/dL (ref 8–23)
CO2: 28 mmol/L (ref 22–32)
Calcium: 9.3 mg/dL (ref 8.9–10.3)
Chloride: 101 mmol/L (ref 98–111)
Creatinine, Ser: 1.06 mg/dL (ref 0.61–1.24)
GFR calc Af Amer: >60 mL/min (ref >60)
GFR calc non Af Amer: >60 mL/min (ref >60)
Glucose, Bld: 183 mg/dL — ABNORMAL HIGH (ref 70–99)
Potassium: 3.4 mmol/L — ABNORMAL LOW (ref 3.5–5.1)
Sodium: 138 mmol/L (ref 135–145)

## 2018-10-06 LAB — BODY FLUID CELL COUNT WITH DIFFERENTIAL
Eos, Fluid: 0 %
Lymphs, Fluid: 79 %
Monocyte-Macrophage-Serous Fluid: 15 %
Neutrophil Count, Fluid: 6 %
Total Nucleated Cell Count, Fluid: 387 cu mm

## 2018-10-06 LAB — CBC
HCT: 42.6 % (ref 39.0–52.0)
Hemoglobin: 14 g/dL (ref 13.0–17.0)
MCH: 30.4 pg (ref 26.0–34.0)
MCHC: 32.9 g/dL (ref 30.0–36.0)
MCV: 92.4 fL (ref 80.0–100.0)
Platelets: 165 10*3/uL (ref 150–400)
RBC: 4.61 MIL/uL (ref 4.22–5.81)
RDW: 11.9 % (ref 11.5–15.5)
WBC: 8.9 10*3/uL (ref 4.0–10.5)
nRBC: 0 % (ref 0.0–0.2)

## 2018-10-06 LAB — AMYLASE, PLEURAL OR PERITONEAL FLUID: Amylase, Fluid: 56 U/L

## 2018-10-06 LAB — PROTEIN, PLEURAL OR PERITONEAL FLUID: Total protein, fluid: 5.1 g/dL

## 2018-10-06 LAB — GLUCOSE, PLEURAL OR PERITONEAL FLUID: Glucose, Fluid: 160 mg/dL

## 2018-10-06 LAB — MAGNESIUM: Magnesium: 2 mg/dL (ref 1.7–2.4)

## 2018-10-06 MED ORDER — BISACODYL 5 MG PO TBEC
10.0000 mg | DELAYED_RELEASE_TABLET | Freq: Every day | ORAL | Status: DC | PRN
  Administered 2018-10-08: 10 mg via ORAL
  Filled 2018-10-06: qty 2

## 2018-10-06 MED ORDER — ENSURE ENLIVE PO LIQD
237.0000 mL | Freq: Three times a day (TID) | ORAL | Status: DC
  Administered 2018-10-06 – 2018-10-08 (×7): 237 mL via ORAL

## 2018-10-06 MED ORDER — SENNA 8.6 MG PO TABS
1.0000 | ORAL_TABLET | Freq: Every day | ORAL | Status: DC | PRN
  Administered 2018-10-08: 8.6 mg via ORAL
  Filled 2018-10-06: qty 1

## 2018-10-06 MED ORDER — CITALOPRAM HYDROBROMIDE 20 MG PO TABS
10.0000 mg | ORAL_TABLET | Freq: Every day | ORAL | Status: DC
  Administered 2018-10-06 – 2018-10-08 (×3): 10 mg via ORAL
  Filled 2018-10-06 (×3): qty 1

## 2018-10-06 MED ORDER — ADULT MULTIVITAMIN W/MINERALS CH
1.0000 | ORAL_TABLET | Freq: Every day | ORAL | Status: DC
  Administered 2018-10-07 – 2018-10-08 (×2): 1 via ORAL
  Filled 2018-10-06 (×2): qty 1

## 2018-10-06 NOTE — Progress Notes (Signed)
Initial Nutrition Assessment  DOCUMENTATION CODES:   Not applicable  INTERVENTION:   Ensure Enlive po TID, each supplement provides 350 kcal and 20 grams of protein  MVI daily   NUTRITION DIAGNOSIS:   Increased nutrient needs related to acute illness(new lung mass) as evidenced by increased estimated needs.  GOAL:   Patient will meet greater than or equal to 90% of their needs  MONITOR:   PO intake, Supplement acceptance, Labs, Weight trends, Skin, I & O's  REASON FOR ASSESSMENT:   Malnutrition Screening Tool    ASSESSMENT:   85 y.o. male with a known history of essential hypertension, hyperlipidemia, diabetes, chronic constipation, history of benign positional vertigo who presents to the hospital complaining of right-sided chest pain along with some shortness of breath. Pt found to have new lung mass with bone mets and plueral effusion  RD working remotely.  Spoke with pt's wife via phone. Wife reports pt with decreased appetite and oral intake over the past few months. Pt does not drink any supplements at home. Wife reports that patient ate <50% of his breakfast this morning; pt ate some sausage and eggs. Pt is willing to try Ensure. Pt reports that he has lost ~20lbs over the past 3 months. Per chart, pt with 16lb(10%) weight loss since 11/2017. Pt's UBW is ~165-175lbs. RD will add supplements and MVI to help pt meet his estimated needs.   Medications reviewed and include: lovenox, KCl  Labs reviewed: K 3.4(L)  Unable to complete Nutrition-Focused physical exam at this time.   Diet Order:   Diet Order            Diet regular Room service appropriate? Yes; Fluid consistency: Thin  Diet effective now             EDUCATION NEEDS:   Education needs have been addressed  Skin:  Skin Assessment: Reviewed RN Assessment  Last BM:  pta  Height:   Ht Readings from Last 1 Encounters:  10/05/18 5\' 6"  (1.676 m)    Weight:   Wt Readings from Last 1 Encounters:   10/06/18 68.3 kg    Ideal Body Weight:  64.5 kg  BMI:  Body mass index is 24.3 kg/m.  Estimated Nutritional Needs:   Kcal:  1700-2000kcal/day  Protein:  85-100g/day  Fluid:  >1.7L/day  Koleen Distance MS, RD, LDN Pager #- [phone] Office#- [phone] After Hours Pager: [phone]

## 2018-10-06 NOTE — Progress Notes (Signed)
[facility] [place] NAME: Andres Hebert    MR#:  [id number]  DATE OF BIRTH:  08-02-1931  SUBJECTIVE:  CHIEF COMPLAINT:  No chief complaint on file.  The patient has better chest pain and shortness of breath, hemoptysis during thoracentesis today. REVIEW OF SYSTEMS:  Review of Systems  Constitutional: Positive for weight loss. Negative for chills, fever and malaise/fatigue.  HENT: Negative for sore throat.   Eyes: Negative for blurred vision and double vision.  Respiratory: Positive for cough, hemoptysis, sputum production and shortness of breath. Negative for wheezing and stridor.   Cardiovascular: Positive for chest pain. Negative for palpitations, orthopnea and leg swelling.  Gastrointestinal: Negative for abdominal pain, blood in stool, diarrhea, melena, nausea and vomiting.  Genitourinary: Negative for dysuria, flank pain and hematuria.  Musculoskeletal: Negative for back pain and joint pain.  Skin: Negative for rash.  Neurological: Negative for dizziness, sensory change, focal weakness, seizures, loss of consciousness, weakness and headaches.  Endo/Heme/Allergies: Negative for polydipsia.  Psychiatric/Behavioral: Negative for depression. The patient is not nervous/anxious.     DRUG ALLERGIES:   Allergies  Allergen Reactions   Accupril [Quinapril Hcl] Other (See Comments)    Reaction:dizziness per MR   VITALS:  Blood pressure 117/78, pulse 94, temperature 98.3 F (36.8 C), temperature source Oral, resp. rate 17, height 5\' 6"  (1.676 m), weight 68.3 kg, SpO2 92 %. PHYSICAL EXAMINATION:  Physical Exam Constitutional:      General: He is not in acute distress. HENT:     Head: Normocephalic.     Mouth/Throat:     Mouth: Mucous membranes are moist.  Eyes:     General: No scleral icterus.    Conjunctiva/sclera: Conjunctivae normal.     Pupils: Pupils are equal, round, and reactive to light.  Neck:     Musculoskeletal:  Normal range of motion and neck supple.     Vascular: No JVD.     Trachea: No tracheal deviation.  Cardiovascular:     Rate and Rhythm: Normal rate and regular rhythm.     Heart sounds: Normal heart sounds. No murmur. No gallop.   Pulmonary:     Effort: Pulmonary effort is normal. No respiratory distress.     Breath sounds: Normal breath sounds. No wheezing or rales.  Abdominal:     General: Bowel sounds are normal. There is no distension.     Palpations: Abdomen is soft.     Tenderness: There is no abdominal tenderness. There is no rebound.  Musculoskeletal: Normal range of motion.        General: No tenderness.     Right lower leg: No edema.     Left lower leg: No edema.  Skin:    Findings: No erythema or rash.  Neurological:     General: No focal deficit present.     Mental Status: He is alert and oriented to person, place, and time.     Cranial Nerves: No cranial nerve deficit.  Psychiatric:        Mood and Affect: Mood normal.    LABORATORY PANEL:  Male CBC Recent Labs  Lab 10/06/18 0532  WBC 8.9  HGB 14.0  HCT 42.6  PLT 165   ------------------------------------------------------------------------------------------------------------------ Chemistries  Recent Labs  Lab 10/06/18 0532  NA 138  K 3.4*  CL 101  CO2 28  GLUCOSE 183*  BUN 23  CREATININE 1.06  CALCIUM 9.3   RADIOLOGY:  Dg Chest 2 View  Result  Date: 10/05/2018 CLINICAL DATA:  Shortness of breath EXAM: CHEST - 2 VIEW COMPARISON:  None. FINDINGS: Small right pleural effusion with lateral loculated component versus pleural nodularity. The lungs are hyperexpanded with increased interstitial markings. Heart size is normal. There is calcific aortic atherosclerosis. IMPRESSION: Small right pleural effusion with lateral loculated component versus pleural nodularity. In the setting of recent trauma, this may indicate nondisplaced or minimally displaced rib fractures with a small hemothorax. CT may be  helpful for further investigation. Electronically Signed   By: Ulyses Jarred M.D.   On: 10/05/2018 19:10   Ct Angio Chest Pe W And/or Wo Contrast  Result Date: 10/05/2018 CLINICAL DATA:  Chest pain EXAM: CT ANGIOGRAPHY CHEST WITH CONTRAST TECHNIQUE: Multidetector CT imaging of the chest was performed using the standard protocol during bolus administration of intravenous contrast. Multiplanar CT image reconstructions and MIPs were obtained to evaluate the vascular anatomy. CONTRAST:  59mL OMNIPAQUE IOHEXOL 350 MG/ML SOLN COMPARISON:  Chest x-ray today FINDINGS: Cardiovascular: No filling defects in the pulmonary arteries to suggest pulmonary emboli. Heart is normal size. Aorta is normal caliber. Coronary artery and aortic atherosclerosis. Mediastinum/Nodes: Enlarged right hilar lymph nodes with index node having a short axis diameter of 10 mm. No mediastinal, left hilar or axillary adenopathy. Lungs/Pleura: There is concern for right lower lobe mass measuring approximately 8.4 x 6.9 cm. Associated large right joint effusion with pleural nodules concerning for metastatic pleural involvement. Compressive atelectasis in the right lower lobe. No focal opacity on the left. Moderate to advanced emphysema. Upper Abdomen: Imaging into the upper abdomen shows no acute findings. Musculoskeletal: Destructive mass noted in the lateral right 7th rib. Bone destruction also in the lateral right 8th rib. Diffuse degenerative changes in the thoracic spine. Review of the MIP images confirms the above findings. IMPRESSION: Large right lower lobe mass measures up to 8.4 cm most compatible with lung cancer. This is associated with numerous pleural nodules, large right pleural effusion compatible with pleural metastatic disease. There also bony destructive lytic lesions in the right lateral 7th and 8th ribs. Moderate to advanced emphysema. No evidence of pulmonary embolus. Aortic Atherosclerosis (ICD10-I70.0) and Emphysema  (ICD10-J43.9). Electronically Signed   By: Rolm Baptise M.D.   On: 10/05/2018 21:10   Dg Chest Port 1 View  Result Date: 10/06/2018 CLINICAL DATA:  Post right-sided thoracentesis EXAM: PORTABLE CHEST 1 VIEW COMPARISON:  Chest radiograph-10/05/2018; chest CT-10/05/2018 FINDINGS: Grossly unchanged cardiac silhouette and mediastinal contours with atherosclerotic plaque within the thoracic aorta. Interval reduction/near resolution of right-sided pleural effusion post thoracentesis. No pneumothorax. Improved aeration of the right lung base with persistent masslike consolidative opacities and partial atelectasis/collapse. There is persistent pleuroparenchymal thickening about the peripheral aspect of the right mid lung. The left hemithorax remains well aerated. Similar findings of lung hyperexpansion. No evidence of edema. No acute osseous abnormalities. Degenerative change the right glenohumeral joint. IMPRESSION: 1. Interval reduction/near resolution of right-sided pleural effusion post thoracentesis. No pneumothorax. 2. Improved aeration of the right lower lung with persistent masslike consolidative opacities and partial atelectasis/collapse. 3. Grossly unchanged pleuroparenchymal thickening along the right lateral chest wall compatible with suspected pleural metastasis demonstrated on preceding chest CT. 4. Aortic Atherosclerosis (ICD10-I70.0) and Emphysema (ICD10-J43.9). Electronically Signed   By: Sandi Mariscal M.D.   On: 10/06/2018 10:04   US Thoracentesis Asp Pleural Space W/img Guide  Result Date: 10/06/2018 INDICATION: No known primary, now with concern for metastatic lung cancer with symptomatic right-sided pleural effusion. Please perform ultrasound-guided thoracentesis for diagnostic and  therapeutic purposes. EXAM: US THORACENTESIS ASP PLEURAL SPACE W/IMG GUIDE COMPARISON:  Chest radiograph-10/05/2018; chest CT-10/05/2018 MEDICATIONS: None. COMPLICATIONS: None immediate. TECHNIQUE: Informed written  consent was obtained from the patient after a discussion of the risks, benefits and alternatives to treatment. A timeout was performed prior to the initiation of the procedure. Initial ultrasound scanning demonstrates a moderate sized right-sided anechoic pleural effusion. The lower chest was prepped and draped in the usual sterile fashion. 1% lidocaine was used for local anesthesia. An ultrasound image was saved for documentation purposes. An 8 Fr Safe-T-Centesis catheter was introduced. The thoracentesis was performed. The catheter was removed and a dressing was applied. The patient tolerated the procedure well without immediate post procedural complication. The patient was escorted to have an upright chest radiograph. FINDINGS: A total of approximately 1.2 liters of serous fluid was removed. Requested samples were sent to the laboratory. IMPRESSION: Successful ultrasound-guided right sided thoracentesis yielding 1.2 liters of pleural fluid. Electronically Signed   By: Sandi Mariscal M.D.   On: 10/06/2018 12:19   ASSESSMENT AND PLAN:   85 y.o. male with a known history of essential hypertension, hyperlipidemia, diabetes, chronic constipation, history of benign positional vertigo who presents to the hospital complaining of right-sided chest pain along with some shortness of breath.  1.  Acute respiratory failure with hypoxia-secondary to the lung mass with a large right-sided pleural effusion is seen. Off oxygen.  NEB PRN.  2.  Pleural effusion-source of patient's worsening shortness of breath and hypoxia. - Suspect this is a malignant pleural effusion given the patient's right lung mass and lytic lesions on the right lateral ribs. S/p diagnostic and therapeutic thoracentesis, yielding 1.2 liters of pleural fluid this morning.  3.  Chest pain- this is on the right side and pleuritic in nature secondary to the metastatic rib lesions. -Continue supportive care with pain control with IV morphine, IV  Toradol.  4.  Lung mass- noted on the CT chest as mentioned.  Suspicious to be lung cancer given patient's intermittent hemoptysis weight loss and history of tobacco abuse. Follow-up oncology consult from Dr. Mike Gip.  5.  Essential hypertension-continue Norvasc  6.  Hyperlipidemia-continue Pravachol.  Hypokalemia.  Potassium supplement.    All the records are reviewed and case discussed with Care Management/Social Worker. Management plans discussed with the patient, his wife and they are in agreement.  CODE STATUS: DNR  TOTAL TIME TAKING CARE OF THIS PATIENT: 28 minutes.   More than 50% of the time was spent in counseling/coordination of care: YES  POSSIBLE D/C IN 2 DAYS, DEPENDING ON CLINICAL CONDITION.   Demetrios Loll M.D on 10/06/2018 at 12:36 PM  Between 7am to 6pm - Pager - [phone]  After 6pm go to [email or web address] - [facility]

## 2018-10-06 NOTE — Progress Notes (Signed)
PT Cancellation Note  Patient Details Name: Andres Hebert MRN: [id number] DOB: 1931-07-30   Cancelled Treatment:    Reason Eval/Treat Not Completed: Patient at procedure or test/unavailable(Patietn consult received and reviewed. Patient in process of being transported for procedure/test at time of PT attempt. Will attempt again at later time/date.)  Janna Arch, PT, DPT   10/06/2018, 9:09 AM

## 2018-10-06 NOTE — Procedures (Signed)
Pre procedural Dx: Symptomatic Pleural effusion Post procedural Dx: Same  Successful US guided right sided thoracentesis yielding 1.2 L of serous pleural fluid.   Samples sent to lab for analysis.  EBL: None  Complications: None immediate.  Ronny Bacon, MD Pager #: [phone]

## 2018-10-07 ENCOUNTER — Inpatient Hospital Stay: Payer: Medicare HMO

## 2018-10-07 DIAGNOSIS — C782 Secondary malignant neoplasm of pleura: Secondary | ICD-10-CM

## 2018-10-07 DIAGNOSIS — Z87891 Personal history of nicotine dependence: Secondary | ICD-10-CM

## 2018-10-07 DIAGNOSIS — C3431 Malignant neoplasm of lower lobe, right bronchus or lung: Principal | ICD-10-CM

## 2018-10-07 LAB — BASIC METABOLIC PANEL
Anion gap: 10 (ref 5–15)
BUN: 35 mg/dL — ABNORMAL HIGH (ref 8–23)
CO2: 30 mmol/L (ref 22–32)
Calcium: 9.3 mg/dL (ref 8.9–10.3)
Chloride: 101 mmol/L (ref 98–111)
Creatinine, Ser: 1.07 mg/dL (ref 0.61–1.24)
GFR calc Af Amer: >60 mL/min (ref >60)
GFR calc non Af Amer: >60 mL/min (ref >60)
Glucose, Bld: 146 mg/dL — ABNORMAL HIGH (ref 70–99)
Potassium: 3.5 mmol/L (ref 3.5–5.1)
Sodium: 141 mmol/L (ref 135–145)

## 2018-10-07 LAB — HEMOGLOBIN: Hemoglobin: 13.1 g/dL (ref 13.0–17.0)

## 2018-10-07 MED ORDER — GADOBUTROL 1 MMOL/ML IV SOLN
7.0000 mL | Freq: Once | INTRAVENOUS | Status: AC | PRN
  Administered 2018-10-07: 7 mL via INTRAVENOUS

## 2018-10-07 MED ORDER — ENOXAPARIN SODIUM 40 MG/0.4ML ~~LOC~~ SOLN
40.0000 mg | SUBCUTANEOUS | Status: DC
  Administered 2018-10-07: 22:00:00 40 mg via SUBCUTANEOUS
  Filled 2018-10-07: qty 0.4

## 2018-10-07 NOTE — Progress Notes (Signed)
Physical Therapy Evaluation Patient Details Name: Andres Hebert MRN: [id number] DOB: 28-Jun-1931 Today's Date: 10/07/2018   History of Present Illness  Andres Hebert  is a 85 y.o. male with a known history of essential hypertension, hyperlipidemia, diabetes, chronic constipation, history of benign positional vertigo who presents to the hospital complaining of right-sided chest pain along with some shortness of breath.   Clinical Impression  Patient performs bed mobility, transfers and gait 300 feet with RW and MI. He has WFL strength BLE and will benefit from HHPT at home to practice steps, assess needs in the home and improve strength. He has no skilled PT needs at this time at the hospital.     Follow Up Recommendations Home health PT    Equipment Recommendations  None recommended by PT    Recommendations for Other Services       Precautions / Restrictions Restrictions Weight Bearing Restrictions: No      Mobility  Bed Mobility Overal bed mobility: Modified Independent                Transfers Overall transfer level: Modified independent                  Ambulation/Gait Ambulation/Gait assistance: Modified independent (Device/Increase time) Gait Distance (Feet): 300 Feet Assistive device: Rolling walker (2 wheeled) Gait Pattern/deviations: Step-through pattern        Stairs            Wheelchair Mobility    Modified Rankin (Stroke Patients Only)       Balance Overall balance assessment: Mild deficits observed, not formally tested                                           Pertinent Vitals/Pain Pain Assessment: No/denies pain Pain Score: 0-No pain    Home Living Family/patient expects to be discharged to:: Private residence Living Arrangements: Spouse/significant other Available Help at Discharge: Family Type of Home: House Home Access: Stairs to enter Entrance Stairs-Rails: Right;Left;Can reach both Engineer, site of Steps: 2          Prior Function Level of Independence: Independent with assistive device(s)               Hand Dominance        Extremity/Trunk Assessment        Lower Extremity Assessment Lower Extremity Assessment: Overall WFL for tasks assessed       Communication   Communication: No difficulties  Cognition Arousal/Alertness: Awake/alert Behavior During Therapy: WFL for tasks assessed/performed Overall Cognitive Status: Within Functional Limits for tasks assessed                                        General Comments      Exercises     Assessment/Plan    PT Assessment Patent does not need any further PT services  PT Problem List         PT Treatment Interventions      PT Goals (Current goals can be found in the Care Plan section)  Acute Rehab PT Goals Patient Stated Goal: to go hoome PT Goal Formulation: With patient/family Time For Goal Achievement: 10/21/18 Potential to Achieve Goals: Good    Frequency     Barriers to discharge  Co-evaluation               AM-PAC PT "6 Clicks" Mobility  Outcome Measure Help needed turning from your back to your side while in a flat bed without using bedrails?: None Help needed moving from lying on your back to sitting on the side of a flat bed without using bedrails?: None Help needed moving to and from a bed to a chair (including a wheelchair)?: None Help needed standing up from a chair using your arms (e.g., wheelchair or bedside chair)?: None Help needed to walk in hospital room?: None Help needed climbing 3-5 steps with a railing? : A Little 6 Click Score: 23    End of Session Equipment Utilized During Treatment: Gait belt Activity Tolerance: Patient tolerated treatment well;Patient limited by fatigue Patient left: in bed;with call bell/phone within reach;with bed alarm set        Time: 1050-1115 PT Time Calculation (min) (ACUTE ONLY): 25  min   Charges:   PT Evaluation $PT Eval Low Complexity: 1 Low PT Treatments $Gait Training: 8-22 mins        Alanson Puls , PT DPT 10/07/2018, 12:26 PM

## 2018-10-07 NOTE — Consult Note (Signed)
[facility]  Date of admission:  10/05/2018  Inpatient day:  10/06/2018  Consulting physician: Dr Abel Presto  Reason for Consultation:  Lung mass, pleural effusion  Chief Complaint: Andres Hebert is a 85 y.o. male with a distant smoking history who was admitted through the emergency room with hypoxia, right sided pleural effusion and a large right lung mass.  HPI: The patient has a 25-30 pack-year smoking history.  He stopped smoking about 45 years ago.  His wife states that he was in his usual state of good health until about 3-4 months ago.  Over this period of time, he has had a poor appetite.  He has lost about 25-30 pounds.    He has had some dizzy spells and balance issues.  He fell about 3 weeks ago.  He was prescribed meclizine without improvement.  His wife also notes confusion which is started 4 months ago.  He has had some visual hallucinations.  He has said "off-the-wall things".  He has had no seizures.  He has a cough.  Over the past few weeks, he has had hemoptysis.  He has complained of his ribs hurting on the right side.  He also notes pain in his shoulder.  Because of progressive symptoms, he presented to the emergency room.  CXR on 10/05/2018 revealed a small right pleural effusion with lateral loculated component versus pleural nodularity.  Chest CT angiogram on 10/05/2018 revealed a 8.4 x 6.9 cm right lower lobe mass with numerous pleural nodules and a large right pleural effusion.  There was bony destruction of the right lateral 7th and 8th ribs,  There was no pulmonary embolism.Marland Kitchen  He underwent ultrasound guided right thoracentesis on 10/06/2018.  1.2 liters of serous fluid was removed.  Symptomatically, he states that he is breathing better.   Past Medical History:  Diagnosis Date  . BPV (benign positional vertigo)   . Chronic constipation   . Diabetes mellitus without complication ([place])   . Hyperlipidemia   . Hypertension      History reviewed. No pertinent surgical history.  Family History  Problem Relation Age of Onset  . Stroke Mother   . Prostate cancer Father     Social History:  reports that he quit smoking about 44 years ago. His smoking use included cigarettes. He has a 25.00 pack-year smoking history. He has never used smokeless tobacco. He reports previous alcohol use. He reports that he does not use drugs. He has a 25-30 pack-year smoking history.  He stopped smoking about 45 years ago. He denies any exposure to asbestos, radiation or toxins.  He lives in [place].  He is accompanied by his wife, [name], today.  Allergies:  Allergies  Allergen Reactions  . Accupril [Quinapril Hcl] Other (See Comments)    Reaction:dizziness per MR    Medications Prior to Admission  Medication Sig Dispense Refill  . amLODipine (NORVASC) 5 MG tablet Take 5 mg by mouth daily.    Marland Kitchen aspirin EC 81 MG tablet Take 81 mg by mouth daily.    . citalopram (CELEXA) 10 MG tablet Take 1 tablet by mouth daily.    . potassium chloride (K-DUR,KLOR-CON) 10 MEQ tablet Take 10 mEq by mouth 2 (two) times daily.    . pravastatin (PRAVACHOL) 40 MG tablet Take 40 mg by mouth daily.    Marland Kitchen triamterene-hydrochlorothiazide (MAXZIDE-25) 37.5-25 MG tablet Take 1 tablet by mouth daily.      Review of Systems: GENERAL:  Fatigue.  Sleeps  12= hours/day at home. No fevers, sweats.  Weight loss of 25-30 pounds in 3-4 months. PERFORMANCE STATUS (ECOG):  2 HEENT:  Runny nose.  No visual changes, sore throat, mouth sores or tenderness. Lungs: Shortness of breath (improved after thoracentesis).  Cough. Hemoptysis. Cardiac:  No chest pain, palpitations, orthopnea, or PND. GI:  Poor appetite x 3 months.  No nausea, vomiting, diarrhea, constipation, melena or hematochezia. GU:  No urgency, frequency, dysuria, or hematuria. Musculoskeletal:  Right rib pain.  No back pain.  No muscle tenderness. Extremities:  No pain or swelling. Skin:  No rashes or  skin changes. Neuro:  Confusion at times.  Visual hallucinations.  Balance issues.  No headache, focal numbness or weakness. Endocrine:  No diabetes, thyroid issues, hot flashes or night sweats. Psych:  No mood changes, depression or anxiety. Pain: Rib pain. Review of systems:  All other systems reviewed and found to be negative.  Physical Exam:  Blood pressure 120/62, pulse 73, temperature 97.8 F (36.6 C), temperature source Oral, resp. rate 18, height 5\' 6"  (1.676 m), weight 150 lb 9.2 oz (68.3 kg), SpO2 93 %.  GENERAL:  Chronically fatigued elderly gentleman lying comfortably on the medical unit in no acute distress. MENTAL STATUS:  Alert and oriented to person, place and time. HEAD:  Pearline Cables hair and beard.  Normocephalic, atraumatic, face symmetric, no Cushingoid features. EYES:  Pupils equal round and reactive to light and accomodation.  No conjunctivitis or scleral icterus. ENT:  Oropharynx clear without lesion.  Tongue normal. Mucous membranes moist.  RESPIRATORY:  Decreased breath sounds right base anteriorly.  No rales, wheezes or rhonchi. CARDIOVASCULAR:  Regular rate and rhythm without murmur, rub or gallop. CHEST WALL:  Right lower ribs tender to palpation. ABDOMEN:  Soft, non-tender, with active bowel sounds, and no hepatosplenomegaly.  No masses. SKIN:  No rashes, ulcers or lesions. EXTREMITIES: No edema, no skin discoloration or tenderness.  No palpable cords. LYMPH NODES: No palpable cervical, supraclavicular, axillary or inguinal adenopathy  NEUROLOGICAL: Alert & oriented, cranial nerves II-XII intact; remembers 2 of 3 words immediately and 0 of 3 after 5 minutes (with prompting "guesses" 2 of them.  He knows president when prompted; states 4 nickles in a quarter.  Motor strength 5/5 throughout; sensation intact; poor finger to nose and RAM on the left; gait not tested; no clonus or Babinski.  PSYCH:  Appropriate.   Results for orders placed or performed during the hospital  encounter of 10/05/18 (from the past 48 hour(s))  Basic metabolic panel     Status: Abnormal   Collection Time: 10/05/18  6:21 PM  Result Value Ref Range   Sodium 139 135 - 145 mmol/L   Potassium 3.7 3.5 - 5.1 mmol/L   Chloride 98 98 - 111 mmol/L   CO2 29 22 - 32 mmol/L   Glucose, Bld 156 (H) 70 - 99 mg/dL   BUN 24 (H) 8 - 23 mg/dL   Creatinine, Ser 1.16 0.61 - 1.24 mg/dL   Calcium 9.7 8.9 - 10.3 mg/dL   GFR calc non Af Amer 57 (L) >60 mL/min   GFR calc Af Amer >60 >60 mL/min   Anion gap 12 5 - 15    Comment: Performed at [facility], [address] [place], [place] [address]  CBC     Status: None   Collection Time: 10/05/18  6:21 PM  Result Value Ref Range   WBC 8.9 4.0 - 10.5 K/uL   RBC 4.89 4.22 - 5.81  MIL/uL   Hemoglobin 15.0 13.0 - 17.0 g/dL   HCT 45.6 39.0 - 52.0 %   MCV 93.3 80.0 - 100.0 fL   MCH 30.7 26.0 - 34.0 pg   MCHC 32.9 30.0 - 36.0 g/dL   RDW 12.2 11.5 - 15.5 %   Platelets 180 150 - 400 K/uL   nRBC 0.0 0.0 - 0.2 %    Comment: Performed at [facility], [place], [place] [address]  Troponin I (High Sensitivity)     Status: None   Collection Time: 10/05/18  6:21 PM  Result Value Ref Range   Troponin I (High Sensitivity) 6 <18 ng/L    Comment: (NOTE) Elevated high sensitivity troponin I (hsTnI) values and significant  changes across serial measurements may suggest ACS but many other  chronic and acute conditions are known to elevate hsTnI results.  Refer to the "Links" section for chest pain algorithms and additional  guidance. Performed at [facility], [address] [place], [place] [address]   SARS Coronavirus 2 [facility] order, Performed in [facility] hospital lab) Nasopharyngeal Nasopharyngeal Swab     Status: None   Collection Time: 10/05/18  7:59 PM   Specimen: Nasopharyngeal Swab  Result Value Ref Range   SARS Coronavirus 2 NEGATIVE NEGATIVE    Comment: (NOTE) If result is NEGATIVE SARS-CoV-2  target nucleic acids are NOT DETECTED. The SARS-CoV-2 RNA is generally detectable in upper and lower  respiratory specimens during the acute phase of infection. The lowest  concentration of SARS-CoV-2 viral copies this assay can detect is 250  copies / mL. A negative result does not preclude SARS-CoV-2 infection  and should not be used as the sole basis for treatment or other  patient management decisions.  A negative result may occur with  improper specimen collection / handling, submission of specimen other  than nasopharyngeal swab, presence of viral mutation(s) within the  areas targeted by this assay, and inadequate number of viral copies  (<250 copies / mL). A negative result must be combined with clinical  observations, patient history, and epidemiological information. If result is POSITIVE SARS-CoV-2 target nucleic acids are DETECTED. The SARS-CoV-2 RNA is generally detectable in upper and lower  respiratory specimens dur ing the acute phase of infection.  Positive  results are indicative of active infection with SARS-CoV-2.  Clinical  correlation with patient history and other diagnostic information is  necessary to determine patient infection status.  Positive results do  not rule out bacterial infection or co-infection with other viruses. If result is PRESUMPTIVE POSTIVE SARS-CoV-2 nucleic acids MAY BE PRESENT.   A presumptive positive result was obtained on the submitted specimen  and confirmed on repeat testing.  While 2019 novel coronavirus  (SARS-CoV-2) nucleic acids may be present in the submitted sample  additional confirmatory testing may be necessary for epidemiological  and / or clinical management purposes  to differentiate between  SARS-CoV-2 and other Sarbecovirus currently known to infect humans.  If clinically indicated additional testing with an alternate test  methodology [id number]) is advised. The SARS-CoV-2 RNA is generally  detectable in upper and lower  respiratory sp ecimens during the acute  phase of infection. The expected result is Negative. Fact Sheet for Patients:  StrictlyIdeas.no Fact Sheet for Healthcare Providers: BankingDealers.co.za This test is not yet approved or cleared by the [place] FDA and has been authorized for detection and/or diagnosis of SARS-CoV-2 by FDA under an Emergency Use Authorization (EUA).  This EUA  will remain in effect (meaning this test can be used) for the duration of the COVID-19 declaration under Section 564(b)(1) of the Act, 21 U.S.C. section 360bbb-3(b)(1), unless the authorization is terminated or revoked sooner. Performed at [facility], [place], [place] [address]   Troponin I (High Sensitivity)     Status: None   Collection Time: 10/05/18  8:21 PM  Result Value Ref Range   Troponin I (High Sensitivity) 6 <18 ng/L    Comment: (NOTE) Elevated high sensitivity troponin I (hsTnI) values and significant  changes across serial measurements may suggest ACS but many other  chronic and acute conditions are known to elevate hsTnI results.  Refer to the "Links" section for chest pain algorithms and additional  guidance. Performed at [facility], [address] [place], [place] [address]   Basic metabolic panel     Status: Abnormal   Collection Time: 10/06/18  5:32 AM  Result Value Ref Range   Sodium 138 135 - 145 mmol/L   Potassium 3.4 (L) 3.5 - 5.1 mmol/L   Chloride 101 98 - 111 mmol/L   CO2 28 22 - 32 mmol/L   Glucose, Bld 183 (H) 70 - 99 mg/dL   BUN 23 8 - 23 mg/dL   Creatinine, Ser 1.06 0.61 - 1.24 mg/dL   Calcium 9.3 8.9 - 10.3 mg/dL   GFR calc non Af Amer >60 >60 mL/min   GFR calc Af Amer >60 >60 mL/min   Anion gap 9 5 - 15    Comment: Performed at [facility], [address] [place], [place] [address]  CBC     Status: None   Collection Time: 10/06/18  5:32 AM  Result Value Ref  Range   WBC 8.9 4.0 - 10.5 K/uL   RBC 4.61 4.22 - 5.81 MIL/uL   Hemoglobin 14.0 13.0 - 17.0 g/dL   HCT 42.6 39.0 - 52.0 %   MCV 92.4 80.0 - 100.0 fL   MCH 30.4 26.0 - 34.0 pg   MCHC 32.9 30.0 - 36.0 g/dL   RDW 11.9 11.5 - 15.5 %   Platelets 165 150 - 400 K/uL   nRBC 0.0 0.0 - 0.2 %    Comment: Performed at [facility], [address] [place], [place] [address]  Magnesium     Status: None   Collection Time: 10/06/18  5:32 AM  Result Value Ref Range   Magnesium 2.0 1.7 - 2.4 mg/dL    Comment: Performed at [facility], [address] [place], [place] [address]  Body fluid cell count with differential     Status: Abnormal   Collection Time: 10/06/18 10:00 AM  Result Value Ref Range   Fluid Type-FCT CYTOPLEU    Color, Fluid YELLOW (A) YELLOW   Appearance, Fluid HAZY (A) CLEAR   Total Nucleated Cell Count, Fluid 387 cu mm   Neutrophil Count, Fluid 6 %   Lymphs, Fluid 79 %   Monocyte-Macrophage-Serous Fluid 15 %   Eos, Fluid 0 %    Comment: Performed at [facility], [address] [place], [place] [address]  Amylase, pleural or peritoneal fluid     Status: None   Collection Time: 10/06/18 10:00 AM  Result Value Ref Range   Amylase, Fluid 56 U/L    Comment: NO NORMAL RANGE ESTABLISHED FOR THIS TEST   Fluid Type-FAMY CYTOPLEU     Comment: Performed at [facility], [address], [place], [place] [address]  Protein, pleural or peritoneal fluid     Status: None   Collection Time: 10/06/18 10:00 AM  Result Value Ref Range   Total protein, fluid 5.1 g/dL    Comment: (NOTE) No normal range established for this test Results should be evaluated in conjunction with serum values    Fluid Type-FTP CYTOPLEU     Comment: Performed at [facility], [address] [place], [place] [address]  Glucose, pleural or peritoneal fluid     Status: None   Collection Time: 10/06/18 10:00 AM  Result Value Ref Range   Glucose, Fluid 160  mg/dL    Comment: (NOTE) No normal range established for this test Results should be evaluated in conjunction with serum values    Fluid Type-FGLU CYTOPLEU     Comment: Performed at [facility], [address], [place], [place] [address]   Dg Chest 2 View  Result Date: 10/05/2018 CLINICAL DATA:  Shortness of breath EXAM: CHEST - 2 VIEW COMPARISON:  None. FINDINGS: Small right pleural effusion with lateral loculated component versus pleural nodularity. The lungs are hyperexpanded with increased interstitial markings. Heart size is normal. There is calcific aortic atherosclerosis. IMPRESSION: Small right pleural effusion with lateral loculated component versus pleural nodularity. In the setting of recent trauma, this may indicate nondisplaced or minimally displaced rib fractures with a small hemothorax. CT may be helpful for further investigation. Electronically Signed   By: Ulyses Jarred M.D.   On: 10/05/2018 19:10   Ct Angio Chest Pe W And/or Wo Contrast  Result Date: 10/05/2018 CLINICAL DATA:  Chest pain EXAM: CT ANGIOGRAPHY CHEST WITH CONTRAST TECHNIQUE: Multidetector CT imaging of the chest was performed using the standard protocol during bolus administration of intravenous contrast. Multiplanar CT image reconstructions and MIPs were obtained to evaluate the vascular anatomy. CONTRAST:  71mL OMNIPAQUE IOHEXOL 350 MG/ML SOLN COMPARISON:  Chest x-ray today FINDINGS: Cardiovascular: No filling defects in the pulmonary arteries to suggest pulmonary emboli. Heart is normal size. Aorta is normal caliber. Coronary artery and aortic atherosclerosis. Mediastinum/Nodes: Enlarged right hilar lymph nodes with index node having a short axis diameter of 10 mm. No mediastinal, left hilar or axillary adenopathy. Lungs/Pleura: There is concern for right lower lobe mass measuring approximately 8.4 x 6.9 cm. Associated large right joint effusion with pleural nodules concerning for metastatic pleural  involvement. Compressive atelectasis in the right lower lobe. No focal opacity on the left. Moderate to advanced emphysema. Upper Abdomen: Imaging into the upper abdomen shows no acute findings. Musculoskeletal: Destructive mass noted in the lateral right 7th rib. Bone destruction also in the lateral right 8th rib. Diffuse degenerative changes in the thoracic spine. Review of the MIP images confirms the above findings. IMPRESSION: Large right lower lobe mass measures up to 8.4 cm most compatible with lung cancer. This is associated with numerous pleural nodules, large right pleural effusion compatible with pleural metastatic disease. There also bony destructive lytic lesions in the right lateral 7th and 8th ribs. Moderate to advanced emphysema. No evidence of pulmonary embolus. Aortic Atherosclerosis (ICD10-I70.0) and Emphysema (ICD10-J43.9). Electronically Signed   By: Rolm Baptise M.D.   On: 10/05/2018 21:10   Dg Chest Port 1 View  Result Date: 10/06/2018 CLINICAL DATA:  Post right-sided thoracentesis EXAM: PORTABLE CHEST 1 VIEW COMPARISON:  Chest radiograph-10/05/2018; chest CT-10/05/2018 FINDINGS: Grossly unchanged cardiac silhouette and mediastinal contours with atherosclerotic plaque within the thoracic aorta. Interval reduction/near resolution of right-sided pleural effusion post thoracentesis. No pneumothorax. Improved aeration of the right  lung base with persistent masslike consolidative opacities and partial atelectasis/collapse. There is persistent pleuroparenchymal thickening about the peripheral aspect of the right mid lung. The left hemithorax remains well aerated. Similar findings of lung hyperexpansion. No evidence of edema. No acute osseous abnormalities. Degenerative change the right glenohumeral joint. IMPRESSION: 1. Interval reduction/near resolution of right-sided pleural effusion post thoracentesis. No pneumothorax. 2. Improved aeration of the right lower lung with persistent masslike  consolidative opacities and partial atelectasis/collapse. 3. Grossly unchanged pleuroparenchymal thickening along the right lateral chest wall compatible with suspected pleural metastasis demonstrated on preceding chest CT. 4. Aortic Atherosclerosis (ICD10-I70.0) and Emphysema (ICD10-J43.9). Electronically Signed   By: Sandi Mariscal M.D.   On: 10/06/2018 10:04   US Thoracentesis Asp Pleural Space W/img Guide  Result Date: 10/06/2018 INDICATION: No known primary, now with concern for metastatic lung cancer with symptomatic right-sided pleural effusion. Please perform ultrasound-guided thoracentesis for diagnostic and therapeutic purposes. EXAM: US THORACENTESIS ASP PLEURAL SPACE W/IMG GUIDE COMPARISON:  Chest radiograph-10/05/2018; chest CT-10/05/2018 MEDICATIONS: None. COMPLICATIONS: None immediate. TECHNIQUE: Informed written consent was obtained from the patient after a discussion of the risks, benefits and alternatives to treatment. A timeout was performed prior to the initiation of the procedure. Initial ultrasound scanning demonstrates a moderate sized right-sided anechoic pleural effusion. The lower chest was prepped and draped in the usual sterile fashion. 1% lidocaine was used for local anesthesia. An ultrasound image was saved for documentation purposes. An 8 Fr Safe-T-Centesis catheter was introduced. The thoracentesis was performed. The catheter was removed and a dressing was applied. The patient tolerated the procedure well without immediate post procedural complication. The patient was escorted to have an upright chest radiograph. FINDINGS: A total of approximately 1.2 liters of serous fluid was removed. Requested samples were sent to the laboratory. IMPRESSION: Successful ultrasound-guided right sided thoracentesis yielding 1.2 liters of pleural fluid. Electronically Signed   By: Sandi Mariscal M.D.   On: 10/06/2018 12:19    Assessment:  The patient is a 85 y.o. gentleman with probable metastatic  lung cancer.  He has a 25-30 pack year smoking history.  Chest CT angiogram on 10/05/2018 revealed a 8.4 x 6.9 cm right lower lobe mass with numerous pleural nodules and a large right pleural effusion.  There was bony destruction of the right lateral 7th and 8th ribs.  There was no pulmonary embolism.  Ultrasound guided right thoracentesis on 10/06/2018 revealed 1.2 liters of serous fluid.  Cytology is pending.  Symptomatically, he states that he is breathing better.  He has lost 25-30 pounds.  He has a 3-4 month history of dizzy spells, falls x 1, and confusion.  Neuro exam reveals short term memory issues as well as difficulty with finger to nose and RAM.  Plan:   1.   Metastatic lung cancer  Review concerns for metastatic lung cancer.  Await cytology for confirmation.  If cytology does not provide a diagnosis, discuss plan for biopsy.  Discuss plan for outpatient PET scan.  Preliminary discussion regarding palliative treatment.  Patient is interested in pursuing treatment. 2.   Neurologic  Patient has has issues with dizziness, balance and coordination x 3-4 months.  He has also developed periodic confusion over 3 months rather than gradually over time.  Discuss plans for head MRI with and without contrast to r/o CNS metastasis.  Thank you for allowing me to participate in KIJUAN GALLICCHIO 's care.  I will follow him closely with you while hospitalized and after discharge in the outpatient  department.   Lequita Asal, MD  10/07/2018, 4:31 AM

## 2018-10-07 NOTE — Progress Notes (Signed)
Andres Hebert at [place] NAME: Andres Hebert    MR#:  [id number]  DATE OF BIRTH:  December 09, 1931  SUBJECTIVE:  CHIEF COMPLAINT: Chest pain and shortness of breath  No new complaint this morning.  No chest pain this morning.  No shortness of breath.  Patient had thoracentesis done yesterday.  REVIEW OF SYSTEMS:  Review of Systems  Constitutional: Negative for chills and fever.  HENT: Negative for hearing loss and tinnitus.   Eyes: Negative for blurred vision and double vision.  Respiratory: Negative for cough and hemoptysis.   Cardiovascular: Negative for chest pain and palpitations.  Gastrointestinal: Negative for heartburn and nausea.  Genitourinary: Negative for dysuria and urgency.  Musculoskeletal: Negative for back pain and myalgias.  Skin: Negative for itching and rash.  Neurological: Negative for dizziness and focal weakness.  Psychiatric/Behavioral: Negative for depression and hallucinations.    DRUG ALLERGIES:   Allergies  Allergen Reactions  . Accupril [Quinapril Hcl] Other (See Comments)    Reaction:dizziness per MR   VITALS:  Blood pressure (!) 117/50, pulse 68, temperature 98.6 F (37 C), temperature source Oral, resp. rate 18, height 5\' 6"  (1.676 m), weight 68.3 kg, SpO2 96 %. PHYSICAL EXAMINATION:   Physical Exam  Constitutional: He is oriented to person, place, and time. He appears well-developed and well-nourished.  HENT:  Head: Normocephalic and atraumatic.  Right Ear: External ear normal.  Eyes: Pupils are equal, round, and reactive to light. Conjunctivae are normal.  Neck: Normal range of motion. Neck supple. No thyromegaly present.  Cardiovascular: Normal rate, regular rhythm and normal heart sounds.  Respiratory: Effort normal and breath sounds normal.  GI: Soft. Bowel sounds are normal. There is no abdominal tenderness.  Musculoskeletal: Normal range of motion.        General: No edema.  Neurological: He  is alert and oriented to person, place, and time.  Skin: Skin is warm. No erythema.  Psychiatric: He has a normal mood and affect. His behavior is normal.   LABORATORY PANEL:  Male CBC Recent Labs  Lab 10/06/18 0532 10/07/18 0536  WBC 8.9  --   HGB 14.0 13.1  HCT 42.6  --   PLT 165  --    ------------------------------------------------------------------------------------------------------------------ Chemistries  Recent Labs  Lab 10/06/18 0532 10/07/18 0536  NA 138 141  K 3.4* 3.5  CL 101 101  CO2 28 30  GLUCOSE 183* 146*  BUN 23 35*  CREATININE 1.06 1.07  CALCIUM 9.3 9.3  MG 2.0  --    RADIOLOGY:  No results found. ASSESSMENT AND PLAN:   85 y.o.malewith a known history of essential hypertension, hyperlipidemia, diabetes, chronic constipation, history of benign positional vertigo who presents to the hospital complaining of right-sided chest pain along with some shortness of breath.  1. Acute respiratory failure with hypoxia-secondary to the lung mass with a large right-sided pleural effusion  Off oxygen with oxygen saturation of 96% on room air..  NEB PRN.  2. Pleural effusion-source of patient's worsening shortness of breath and hypoxia. -Suspect this is a malignant pleural effusion given the patient's right lung mass and lytic lesions on the right lateral ribs. S/p diagnostic and therapeutic thoracentesis, yielding 1.2 liters of pleural fluid  on 10/06/2018 Follow-up with oncologist post discharge for cytology results. Hemoglobin remained stable  3. Chest pain-this is on the right side and pleuritic in nature secondary to the metastatic rib lesions.  Pain is controlled.  4. Lung mass-noted on the CT  chest as mentioned. Suspicious to be lung cancer given patient's intermittent hemoptysis weight loss and history of tobacco abuse. Patient seen by oncologist Dr. Mike Gip.  I discussed case with her.  She recommended having MRI of the brain with and  without contrast done today to rule out metastasis since patient has been having balance and coordination issues over the last 3 to 4 months. Physical therapist also to work with patient today.  5.Essential hypertension-continue Norvasc  6. Hyperlipidemia-continue Pravachol.  Hypokalemia.  Potassium supplement.    DVT prophylaxis; patient on Lovenox   All the records are reviewed and case discussed with Care Management/Social Worker. Management plans discussed with the patient, family and they are in agreement.  I have updated wife over the phone on treatment plans this morning. Disposition; plans for discharge tomorrow if MRI of the brain with and without contrast negative and also pending recommendations from physical therapist  CODE STATUS: DNR  TOTAL TIME TAKING CARE OF THIS PATIENT: 35 minutes.   More than 50% of the time was spent in counseling/coordination of care: YES  POSSIBLE D/C IN  1-2 DAYS, DEPENDING ON CLINICAL CONDITION.   Yashika Mask M.D on 10/07/2018 at 12:20 PM  Between 7am to 6pm - Pager - [phone]  After 6pm go to [email or web address] - Proofreader  [facility]  Office  [phone]  CC: Primary care physician; [name], MD  Note: This dictation was prepared with Dragon dictation along with smaller phrase technology. Any transcriptional errors that result from this process are unintentional.

## 2018-10-08 ENCOUNTER — Inpatient Hospital Stay: Payer: Medicare HMO

## 2018-10-08 ENCOUNTER — Encounter: Payer: Self-pay | Admitting: Radiology

## 2018-10-08 LAB — CBC
HCT: 39.6 % (ref 39.0–52.0)
Hemoglobin: 13.2 g/dL (ref 13.0–17.0)
MCH: 30.8 pg (ref 26.0–34.0)
MCHC: 33.3 g/dL (ref 30.0–36.0)
MCV: 92.5 fL (ref 80.0–100.0)
Platelets: 165 10*3/uL (ref 150–400)
RBC: 4.28 MIL/uL (ref 4.22–5.81)
RDW: 12.2 % (ref 11.5–15.5)
WBC: 7.5 10*3/uL (ref 4.0–10.5)
nRBC: 0 % (ref 0.0–0.2)

## 2018-10-08 LAB — BASIC METABOLIC PANEL
Anion gap: 8 (ref 5–15)
BUN: 34 mg/dL — ABNORMAL HIGH (ref 8–23)
CO2: 31 mmol/L (ref 22–32)
Calcium: 9.3 mg/dL (ref 8.9–10.3)
Chloride: 99 mmol/L (ref 98–111)
Creatinine, Ser: 0.93 mg/dL (ref 0.61–1.24)
GFR calc Af Amer: >60 mL/min (ref >60)
GFR calc non Af Amer: >60 mL/min (ref >60)
Glucose, Bld: 157 mg/dL — ABNORMAL HIGH (ref 70–99)
Potassium: 4.1 mmol/L (ref 3.5–5.1)
Sodium: 138 mmol/L (ref 135–145)

## 2018-10-08 LAB — MAGNESIUM: Magnesium: 2.2 mg/dL (ref 1.7–2.4)

## 2018-10-08 MED ORDER — OXYCODONE-ACETAMINOPHEN 5-325 MG PO TABS: 1.0000 | ORAL_TABLET | Freq: Four times a day (QID) | ORAL | 0 refills | Status: DC | PRN

## 2018-10-08 MED ORDER — IOHEXOL 350 MG/ML SOLN
75.0000 mL | Freq: Once | INTRAVENOUS | Status: AC | PRN
  Administered 2018-10-08: 75 mL via INTRAVENOUS

## 2018-10-08 MED ORDER — ENSURE ENLIVE PO LIQD: 237.0000 mL | Freq: Three times a day (TID) | ORAL | 0 refills | Status: AC

## 2018-10-08 MED ORDER — SODIUM CHLORIDE 0.9 % IV SOLN
Freq: Once | INTRAVENOUS | Status: AC
  Administered 2018-10-08: 12:00:00 via INTRAVENOUS

## 2018-10-08 MED ORDER — OXYCODONE-ACETAMINOPHEN 5-325 MG PO TABS: 1.0000 | ORAL_TABLET | Freq: Four times a day (QID) | ORAL | Status: DC | PRN

## 2018-10-08 MED ORDER — SENNA 8.6 MG PO TABS: 1.0000 | ORAL_TABLET | Freq: Every day | ORAL | 0 refills | Status: DC | PRN

## 2018-10-08 NOTE — Plan of Care (Signed)
  Problem: Education: Goal: Knowledge of the prescribed therapeutic regimen will improve Outcome: Adequate for Discharge   Problem: Activity: Goal: Ability to implement measures to reduce episodes of fatigue will improve Outcome: Adequate for Discharge   Problem: Bowel/Gastric: Goal: Will not experience complications related to bowel motility Outcome: Adequate for Discharge   Problem: Coping: Goal: Ability to identify and develop effective coping behavior will improve Outcome: Adequate for Discharge   Problem: Nutritional: Goal: Maintenance of adequate nutrition will improve Outcome: Adequate for Discharge   Problem: Education: Goal: Knowledge of General Education information will improve Description: Including pain rating scale, medication(s)/side effects and non-pharmacologic comfort measures Outcome: Adequate for Discharge   Problem: Health Behavior/Discharge Planning: Goal: Ability to manage health-related needs will improve Outcome: Adequate for Discharge   Problem: Clinical Measurements: Goal: Ability to maintain clinical measurements within normal limits will improve Outcome: Adequate for Discharge Goal: Will remain free from infection Outcome: Adequate for Discharge Goal: Diagnostic test results will improve Outcome: Adequate for Discharge Goal: Respiratory complications will improve Outcome: Adequate for Discharge Goal: Cardiovascular complication will be avoided Outcome: Adequate for Discharge   Problem: Activity: Goal: Risk for activity intolerance will decrease Outcome: Adequate for Discharge   Problem: Nutrition: Goal: Adequate nutrition will be maintained Outcome: Adequate for Discharge   Problem: Coping: Goal: Level of anxiety will decrease Outcome: Adequate for Discharge   Problem: Elimination: Goal: Will not experience complications related to bowel motility Outcome: Adequate for Discharge Goal: Will not experience complications related to  urinary retention Outcome: Adequate for Discharge   Problem: Pain Managment: Goal: General experience of comfort will improve Outcome: Adequate for Discharge   Problem: Safety: Goal: Ability to remain free from injury will improve Outcome: Adequate for Discharge   Problem: Skin Integrity: Goal: Risk for impaired skin integrity will decrease Outcome: Adequate for Discharge

## 2018-10-08 NOTE — Progress Notes (Signed)
Clarified via telephone call to Dr Stark Jock that the pt will be discharged home after the IV order for NS at 100 ml/hr x 3 hours is complete; pt needs IVF NS due to h/o MRI and CT scans in the last 2 days

## 2018-10-08 NOTE — TOC Transition Note (Addendum)
Transition of Care [facility]) - CM/SW Discharge Note   Patient Details  Name: Andres Hebert MRN: [id number] Date of Birth: [birth year]  Transition of Care [facility]) CM/SW Contact:  Latanya Maudlin, RN Phone Number: 10/08/2018, 8:21 AM   Clinical Narrative:  [facility] team consulted to as PT has recommended home health. Patient tells me he lives with his wife and is very independent at home. He reports that he walks a couple miles each day. Patient was able to ambulate 300 feet here. Patient denies need for home health. Patient has access to rolling walker and a cane but says he does not need it. He acknowledges that with his new "lung issues" his tolerance for walking long distances is likely to change but none the less feels home health is not needed.  TOC team will sign off.   Update- after speaking to patients wife they are now agreeable for home health. CMS Medicare.gov Compare Post Acute Care list reviewed with patient and they have no preference of agency. Referral placed with [facility] .    Final next level of care: Home/Self Care Barriers to Discharge: No Barriers Identified   Patient Goals and CMS Choice        Discharge Placement                       Discharge Plan and Services     Post Acute Care Choice: Home Health                    HH Arranged: Patient Refused HH, Refused HH          Social Determinants of Health (SDOH) Interventions     Readmission Risk Interventions Readmission Risk Prevention Plan 10/08/2018  Post Dischage Appt Complete  Medication Screening Complete  Transportation Screening Complete  Some recent data might be hidden

## 2018-10-08 NOTE — Progress Notes (Signed)
MD order received in [facility] to discharge pt home today; verbally reviewed AVS with pt and his spouse, Rxs escribed to [facility] on [address] in [place], [place]; no questions voiced at this time; pt to get dressed in order to be discharged home

## 2018-10-08 NOTE — Discharge Summary (Signed)
[facility] [place] NAME: Andres Hebert    MR#:  [id number]  DATE OF BIRTH:  1931/11/13  DATE OF ADMISSION:  10/05/2018   ADMITTING PHYSICIAN: Andres Leber, MD  DATE OF DISCHARGE: 10/08/2018  PRIMARY CARE PHYSICIAN: Andres Pink, MD   ADMISSION DIAGNOSIS:  Acute respiratory failure with hypoxia ([place]) [J96.01] DISCHARGE DIAGNOSIS:  Active Problems:   Pleural effusion  SECONDARY DIAGNOSIS:   Past Medical History:  Diagnosis Date   BPV (benign positional vertigo)    Chronic constipation    Diabetes mellitus without complication ([place])    Hyperlipidemia    Hypertension    HOSPITAL COURSE:  Chief complaint; right-sided pleuritic chest pain and shortness of breath.  History of presenting complaint; Andres Hebert  is a 85 y.o. male with a known history of essential hypertension, hyperlipidemia, diabetes, chronic constipation, history of benign positional vertigo who presented to the hospital complaining of right-sided chest pain along with some shortness of breath. CT of the chest which showed a right-sided lung mass, with mets to the ribs with some lytic lesions and also noted to have extensive right-sided pleural effusion.  Patient was admitted for further evaluation   Hospital course; 1. Acute respiratory failure with hypoxia-secondary to the lung mass with a large right-sided pleural effusion  Off oxygen with oxygen saturation of 93% on room air.. Clinically and hemodynamically stable for discharge.  2. Pleural effusion-source of patient's worsening shortness of breath and hypoxia. -Suspect this is a malignant pleural effusion given the patient's right lung mass and lytic lesions on the right lateral ribs. S/pdiagnostic and therapeutic thoracentesis,yielding 1.2 liters of pleural fluid on 10/06/2018 Follow-up with oncologist post discharge for cytology results. Hemoglobin remained stable  3. Chest pain-this  is on the right side and pleuritic in nature secondary to the metastatic rib lesions.  Pain is controlled.  Prescription for as needed Percocet given.  4. Lung mass-noted on the CT chest as mentioned. Suspicious to be lung cancer given patient's intermittent hemoptysis weight loss and history of tobacco abuse. Patient seen by oncologist Dr. Mike Hebert.  I discussed case with her.  She recommended having MRI of the brain with and without contrast done during this admission which was negative for brain mets but revealed  distal left vertebral artery with poor flow or occlusion, most likely due to atherosclerosis. CTA or MRA of the neck and head recommended.  Patient subsequently had CTA head and neck done today prior to discharge which revealed ordinary aortic atherosclerosis. Ordinary atherosclerosis at the carotid bifurcations without stenosis. No intracranial anterior circulation finding of significance. Patient has the congenital variation of asymmetric vertebral anatomy. Dominant normal appearing right vertebral artery widely patent to the basilar. Tiny left vertebral artery arising directly from the arch, but patent through the neck, supplying left PICA and with a rudimentary contribution to the basilar. This is not likely to be of any clinical relevance.  Patient hydrated with IV fluids over a several hours to flush out the contrast prior to discharge from the hospital Patient clinically and hemodynamically stable.  Already evaluated by physical therapist and recommended home health with physical therapy on discharge.  Case manager to assist with setting up on discharge. 5.Essential hypertension-continue Norvasc 6. Hyperlipidemia-continue Pravachol.  Patient clinically hemodynamically stable.  Wishes to be discharged home today.  Follow-up with oncologist Dr. Mike Hebert as outpatient.  DISCHARGE CONDITIONS:  Stable CONSULTS OBTAINED:   DRUG ALLERGIES:   Allergies  Allergen Reactions  Accupril [Quinapril Hcl] Other (See Comments)    Reaction:dizziness per MR   DISCHARGE MEDICATIONS:   Allergies as of 10/08/2018      Reactions   Accupril [quinapril Hcl] Other (See Comments)   Reaction:dizziness per MR      Medication List    STOP taking these medications   aspirin EC 81 MG tablet     TAKE these medications   amLODipine 5 MG tablet Commonly known as: NORVASC Take 5 mg by mouth daily.   citalopram 10 MG tablet Commonly known as: CELEXA Take 1 tablet by mouth daily.   feeding supplement (ENSURE ENLIVE) Liqd Take 237 mLs by mouth 3 (three) times daily between meals.   oxyCODONE-acetaminophen 5-325 MG tablet Commonly known as: PERCOCET/ROXICET Take 1 tablet by mouth every 6 (six) hours as needed for moderate pain.   potassium chloride 10 MEQ tablet Commonly known as: K-DUR Take 10 mEq by mouth 2 (two) times daily.   pravastatin 40 MG tablet Commonly known as: PRAVACHOL Take 40 mg by mouth daily.   senna 8.6 MG Tabs tablet Commonly known as: SENOKOT Take 1 tablet (8.6 mg total) by mouth daily as needed for mild constipation.   triamterene-hydrochlorothiazide 37.5-25 MG tablet Commonly known as: MAXZIDE-25 Take 1 tablet by mouth daily.        DISCHARGE INSTRUCTIONS:   DIET:  Cardiac diet DISCHARGE CONDITION:  Stable ACTIVITY:  Activity as tolerated OXYGEN:  Home Oxygen: No.  Oxygen Delivery: room air DISCHARGE LOCATION:  home   If you experience worsening of your admission symptoms, develop shortness of breath, life threatening emergency, suicidal or homicidal thoughts you must seek medical attention immediately by calling 911 or calling your MD immediately  if symptoms less severe.  You Must read complete instructions/literature along with all the possible adverse reactions/side effects for all the Medicines you take and that have been prescribed to you. Take any new Medicines after you have completely understood and accpet all the  possible adverse reactions/side effects.   Please note  You were cared for by a hospitalist during your hospital stay. If you have any questions about your discharge medications or the care you received while you were in the hospital after you are discharged, you can call the unit and asked to speak with the hospitalist on call if the hospitalist that took care of you is not available. Once you are discharged, your primary care physician will handle any further medical issues. Please note that NO REFILLS for any discharge medications will be authorized once you are discharged, as it is imperative that you return to your primary care physician (or establish a relationship with a primary care physician if you do not have one) for your aftercare needs so that they can reassess your need for medications and monitor your lab values.    On the day of Discharge:  VITAL SIGNS:  Blood pressure (!) 117/53, pulse 67, temperature 98.1 F (36.7 C), temperature source Oral, resp. rate 18, height 5\' 6"  (1.676 m), weight 68.3 kg, SpO2 93 %. PHYSICAL EXAMINATION:  GENERAL:  85 y.o.-year-old patient lying in the bed with no acute distress.  EYES: Pupils equal, round, reactive to light and accommodation. No scleral icterus. Extraocular muscles intact.  HEENT: Head atraumatic, normocephalic. Oropharynx and nasopharynx clear.  NECK:  Supple, no jugular venous distention. No thyroid enlargement, no tenderness.  LUNGS: Normal breath sounds bilaterally, no wheezing, rales,rhonchi or crepitation. No use of accessory muscles of respiration.  CARDIOVASCULAR: S1, S2 normal.  No murmurs, rubs, or gallops.  ABDOMEN: Soft, non-tender, non-distended. Bowel sounds present. No organomegaly or mass.  EXTREMITIES: No pedal edema, cyanosis, or clubbing.  NEUROLOGIC: Cranial nerves II through XII are intact. Muscle strength 5/5 in all extremities. Sensation intact. Gait not checked.  PSYCHIATRIC: The patient is alert and oriented x  3.  SKIN: No obvious rash, lesion, or ulcer.  DATA REVIEW:   CBC Recent Labs  Lab 10/08/18 0541  WBC 7.5  HGB 13.2  HCT 39.6  PLT 165    Chemistries  Recent Labs  Lab 10/08/18 0541  NA 138  K 4.1  CL 99  CO2 31  GLUCOSE 157*  BUN 34*  CREATININE 0.93  CALCIUM 9.3  MG 2.2     Microbiology Results  Results for orders placed or performed during the hospital encounter of 10/05/18  SARS Coronavirus 2 [facility] order, Performed in [facility] hospital lab) Nasopharyngeal Nasopharyngeal Swab     Status: None   Collection Time: 10/05/18  7:59 PM   Specimen: Nasopharyngeal Swab  Result Value Ref Range Status   SARS Coronavirus 2 NEGATIVE NEGATIVE Final    Comment: (NOTE) If result is NEGATIVE SARS-CoV-2 target nucleic acids are NOT DETECTED. The SARS-CoV-2 RNA is generally detectable in upper and lower  respiratory specimens during the acute phase of infection. The lowest  concentration of SARS-CoV-2 viral copies this assay can detect is 250  copies / mL. A negative result does not preclude SARS-CoV-2 infection  and should not be used as the sole basis for treatment or other  patient management decisions.  A negative result may occur with  improper specimen collection / handling, submission of specimen other  than nasopharyngeal swab, presence of viral mutation(s) within the  areas targeted by this assay, and inadequate number of viral copies  (<250 copies / mL). A negative result must be combined with clinical  observations, patient history, and epidemiological information. If result is POSITIVE SARS-CoV-2 target nucleic acids are DETECTED. The SARS-CoV-2 RNA is generally detectable in upper and lower  respiratory specimens dur ing the acute phase of infection.  Positive  results are indicative of active infection with SARS-CoV-2.  Clinical  correlation with patient history and other diagnostic information is  necessary to determine patient infection status.   Positive results do  not rule out bacterial infection or co-infection with other viruses. If result is PRESUMPTIVE POSTIVE SARS-CoV-2 nucleic acids MAY BE PRESENT.   A presumptive positive result was obtained on the submitted specimen  and confirmed on repeat testing.  While 2019 novel coronavirus  (SARS-CoV-2) nucleic acids may be present in the submitted sample  additional confirmatory testing may be necessary for epidemiological  and / or clinical management purposes  to differentiate between  SARS-CoV-2 and other Sarbecovirus currently known to infect humans.  If clinically indicated additional testing with an alternate test  methodology [phone]) is advised. The SARS-CoV-2 RNA is generally  detectable in upper and lower respiratory sp ecimens during the acute  phase of infection. The expected result is Negative. Fact Sheet for Patients:  StrictlyIdeas.no Fact Sheet for Healthcare Providers: BankingDealers.co.za This test is not yet approved or cleared by the [place] FDA and has been authorized for detection and/or diagnosis of SARS-CoV-2 by FDA under an Emergency Use Authorization (EUA).  This EUA will remain in effect (meaning this test can be used) for the duration of the COVID-19 declaration under Section 564(b)(1) of the Act, 21 U.S.C. section 360bbb-3(b)(1), unless the authorization is  terminated or revoked sooner. Performed at [facility], [address] [place], [place] [address]   Body fluid culture     Status: None (Preliminary result)   Collection Time: 10/06/18 10:00 AM   Specimen: [facility] Cytology Pleural fluid  Result Value Ref Range Status   Specimen Description   Final    PLEURAL Performed at [facility], [address] [place], [place] [address]    Special Requests   Final    NONE Performed at [facility], [place], [place] [address]    Gram Stain NO WBC  SEEN NO ORGANISMS SEEN   Final   Culture   Final    NO GROWTH 2 DAYS Performed at [facility], [place] [address], [place], [place] [address]    Report Status PENDING  Incomplete    RADIOLOGY:  Ct Angio Head W Or Wo Contrast  Result Date: 10/08/2018 CLINICAL DATA:  Lung cancer. Balance disturbance. Abnormal flow left vertebral artery seen by MRI. EXAM: CT ANGIOGRAPHY HEAD AND NECK TECHNIQUE: Multidetector CT imaging of the head and neck was performed using the standard protocol during bolus administration of intravenous contrast. Multiplanar CT image reconstructions and MIPs were obtained to evaluate the vascular anatomy. Carotid stenosis measurements (when applicable) are obtained utilizing NASCET criteria, using the distal internal carotid diameter as the denominator. CONTRAST:  86mL OMNIPAQUE IOHEXOL 350 MG/ML SOLN COMPARISON:  MRI 10/07/2018 FINDINGS: CT HEAD FINDINGS Brain: Mild age related volume loss. Mild chronic appearing small vessel change of the hemispheric white matter. No sign of acute infarction, mass lesion, hemorrhage, hydrocephalus or extra-axial collection. Vascular: Negative noncontrast appearance. Skull: Normal Sinuses: Clear Orbits: Normal Review of the MIP images confirms the above findings CTA NECK FINDINGS Aortic arch: Aortic atherosclerosis. No aneurysm or dissection. Branching pattern shows the congenital variation of a small left vertebral artery arising from the arch. Right carotid system: Common carotid artery widely patent to the bifurcation. Ordinary atherosclerotic plaque at the carotid bifurcation and ICA bulb but no stenosis. Cervical ICA widely patent. Left carotid system: Common carotid artery widely patent to the bifurcation. Ordinary calcified plaque at the carotid bifurcation and ICA bulb but no stenosis. Cervical ICA widely patent. Vertebral arteries: Dominant left vertebral artery is widely patent at its origin and through the cervical region to the foramen  magnum. Non dominant small left vertebral artery arises directly from the arch and is patent through the cervical region to the foramen magnum. Skeleton: Ordinary cervical spondylosis. Other neck: No mass or lymphadenopathy. Upper chest: See results recent chest evaluations. Scarring and emphysema at the lung apices. Layering pleural fluid on the right. Review of the MIP images confirms the above findings CTA HEAD FINDINGS Anterior circulation: Both internal carotid arteries are patent through the skull base and siphon regions. No siphon stenosis. Anterior and middle cerebral vessels are patent without proximal stenosis, aneurysm or vascular malformation. Posterior circulation: Dominant right vertebral artery is widely patent through the foramen magnum to the basilar. Congenital small left vertebral artery is patent through the foramen magnum, supplying left PICA, with a very rudimentary connection to the basilar. No basilar stenosis. Superior cerebellar and posterior cerebral arteries show flow. No stenosis or aneurysm. Venous sinuses: Patent and normal. Anatomic variants: None other significant. Review of the MIP images confirms the above findings IMPRESSION: Ordinary aortic atherosclerosis. Ordinary atherosclerosis at the carotid bifurcations without stenosis. No intracranial anterior circulation finding of significance. Patient has the congenital variation of asymmetric vertebral anatomy.  Dominant normal appearing right vertebral artery widely patent to the basilar. Tiny left vertebral artery arising directly from the arch, but patent through the neck, supplying left PICA and with a rudimentary contribution to the basilar. This is not likely to be of any clinical relevance. Electronically Signed   By: Nelson Chimes M.D.   On: 10/08/2018 10:22   Ct Angio Neck W Or Wo Contrast  Result Date: 10/08/2018 CLINICAL DATA:  Lung cancer. Balance disturbance. Abnormal flow left vertebral artery seen by MRI. EXAM: CT  ANGIOGRAPHY HEAD AND NECK TECHNIQUE: Multidetector CT imaging of the head and neck was performed using the standard protocol during bolus administration of intravenous contrast. Multiplanar CT image reconstructions and MIPs were obtained to evaluate the vascular anatomy. Carotid stenosis measurements (when applicable) are obtained utilizing NASCET criteria, using the distal internal carotid diameter as the denominator. CONTRAST:  48mL OMNIPAQUE IOHEXOL 350 MG/ML SOLN COMPARISON:  MRI 10/07/2018 FINDINGS: CT HEAD FINDINGS Brain: Mild age related volume loss. Mild chronic appearing small vessel change of the hemispheric white matter. No sign of acute infarction, mass lesion, hemorrhage, hydrocephalus or extra-axial collection. Vascular: Negative noncontrast appearance. Skull: Normal Sinuses: Clear Orbits: Normal Review of the MIP images confirms the above findings CTA NECK FINDINGS Aortic arch: Aortic atherosclerosis. No aneurysm or dissection. Branching pattern shows the congenital variation of a small left vertebral artery arising from the arch. Right carotid system: Common carotid artery widely patent to the bifurcation. Ordinary atherosclerotic plaque at the carotid bifurcation and ICA bulb but no stenosis. Cervical ICA widely patent. Left carotid system: Common carotid artery widely patent to the bifurcation. Ordinary calcified plaque at the carotid bifurcation and ICA bulb but no stenosis. Cervical ICA widely patent. Vertebral arteries: Dominant left vertebral artery is widely patent at its origin and through the cervical region to the foramen magnum. Non dominant small left vertebral artery arises directly from the arch and is patent through the cervical region to the foramen magnum. Skeleton: Ordinary cervical spondylosis. Other neck: No mass or lymphadenopathy. Upper chest: See results recent chest evaluations. Scarring and emphysema at the lung apices. Layering pleural fluid on the right. Review of the MIP  images confirms the above findings CTA HEAD FINDINGS Anterior circulation: Both internal carotid arteries are patent through the skull base and siphon regions. No siphon stenosis. Anterior and middle cerebral vessels are patent without proximal stenosis, aneurysm or vascular malformation. Posterior circulation: Dominant right vertebral artery is widely patent through the foramen magnum to the basilar. Congenital small left vertebral artery is patent through the foramen magnum, supplying left PICA, with a very rudimentary connection to the basilar. No basilar stenosis. Superior cerebellar and posterior cerebral arteries show flow. No stenosis or aneurysm. Venous sinuses: Patent and normal. Anatomic variants: None other significant. Review of the MIP images confirms the above findings IMPRESSION: Ordinary aortic atherosclerosis. Ordinary atherosclerosis at the carotid bifurcations without stenosis. No intracranial anterior circulation finding of significance. Patient has the congenital variation of asymmetric vertebral anatomy. Dominant normal appearing right vertebral artery widely patent to the basilar. Tiny left vertebral artery arising directly from the arch, but patent through the neck, supplying left PICA and with a rudimentary contribution to the basilar. This is not likely to be of any clinical relevance. Electronically Signed   By: Nelson Chimes M.D.   On: 10/08/2018 10:22   Mr Jeri Cos VQ Contrast  Result Date: 10/07/2018 CLINICAL DATA:  85 year old male with large right lung mass. Loss of balance over the past  3-4 months. Staging. EXAM: MRI HEAD WITHOUT AND WITH CONTRAST TECHNIQUE: Multiplanar, multiecho pulse sequences of the brain and surrounding structures were obtained without and with intravenous contrast. CONTRAST:  74mL GADAVIST GADOBUTROL 1 MMOL/ML IV SOLN COMPARISON:  None. FINDINGS: Brain: Mild motion artifact on post-contrast images. No abnormal enhancement identified. No midline shift, mass  effect, or evidence of intracranial mass lesion. Cerebral volume is within normal limits for age. No restricted diffusion to suggest acute infarction. No ventriculomegaly, extra-axial collection or acute intracranial hemorrhage. Cervicomedullary junction and pituitary are within normal limits. Minimal to mild for age scattered nonspecific cerebral white matter T2 and FLAIR hyperintensity. No cortical encephalomalacia or chronic cerebral blood products. There is a possible small chronic subcortical white matter lacune in the left anterior frontal lobe on series 10, image 15. Vascular: Loss of the distal left vertebral artery flow void at the skull base although there does appear to be some left V4 segment collateral flow. Other Major intracranial vascular flow voids are preserved. The major dural venous sinuses are enhancing and appear to be patent. Skull and upper cervical spine: Negative visible cervical spine and spinal cord. Visible bone marrow signal is normal. Sinuses/Orbits: Postoperative changes to the globes, otherwise negative orbits. Paranasal sinuses are clear. Other: Mastoids are clear. Visible internal auditory structures appear normal. Scalp and face soft tissues appear negative. IMPRESSION: 1. No metastatic disease or acute intracranial abnormality identified. 2. Distal left vertebral artery with poor flow or occlusion, most likely due to atherosclerosis. CTA or MRA of the neck and head could evaluate further as necessary. Electronically Signed   By: Genevie Ann M.D.   On: 10/07/2018 16:50     Management plans discussed with the patient, family and they are in agreement.  CODE STATUS: DNR   TOTAL TIME TAKING CARE OF THIS PATIENT: 38 minutes.    Dylann Gallier M.D on 10/08/2018 at 11:41 AM  Between 7am to 6pm - Pager - [phone]  After 6pm go to [email or web address] - Proofreader  [facility]  Office  [phone]  CC: Primary care physician; Andres Pink,  MD   Note: This dictation was prepared with Dragon dictation along with smaller phrase technology. Any transcriptional errors that result from this process are unintentional.

## 2018-10-08 NOTE — Progress Notes (Signed)
Pt discharged via wheelchair by nursing to the visitor's entrance

## 2018-10-10 ENCOUNTER — Other Ambulatory Visit: Payer: Self-pay | Admitting: Anatomic Pathology & Clinical Pathology

## 2018-10-10 LAB — BODY FLUID CULTURE
Culture: NO GROWTH
Gram Stain: NONE SEEN

## 2018-10-10 LAB — CYTOLOGY - NON PAP

## 2018-10-13 ENCOUNTER — Other Ambulatory Visit: Payer: Self-pay | Admitting: Hematology and Oncology

## 2018-10-13 ENCOUNTER — Inpatient Hospital Stay: Payer: Medicare HMO

## 2018-10-13 ENCOUNTER — Inpatient Hospital Stay: Payer: Medicare HMO | Attending: Hematology and Oncology | Admitting: Hematology and Oncology

## 2018-10-13 ENCOUNTER — Encounter: Payer: Self-pay | Admitting: Hematology and Oncology

## 2018-10-13 ENCOUNTER — Inpatient Hospital Stay: Payer: Medicare HMO | Admitting: Hematology and Oncology

## 2018-10-13 ENCOUNTER — Ambulatory Visit: Payer: Medicare HMO | Admitting: Oncology

## 2018-10-13 ENCOUNTER — Other Ambulatory Visit: Payer: Self-pay

## 2018-10-13 VITALS — BP 130/59 | HR 70 | Temp 97.7°F | Resp 18 | Ht 66.0 in | Wt 153.8 lb

## 2018-10-13 DIAGNOSIS — C3431 Malignant neoplasm of lower lobe, right bronchus or lung: Secondary | ICD-10-CM

## 2018-10-13 DIAGNOSIS — Z79899 Other long term (current) drug therapy: Secondary | ICD-10-CM | POA: Insufficient documentation

## 2018-10-13 DIAGNOSIS — Z23 Encounter for immunization: Secondary | ICD-10-CM | POA: Insufficient documentation

## 2018-10-13 DIAGNOSIS — E119 Type 2 diabetes mellitus without complications: Secondary | ICD-10-CM

## 2018-10-13 DIAGNOSIS — R0602 Shortness of breath: Secondary | ICD-10-CM | POA: Insufficient documentation

## 2018-10-13 DIAGNOSIS — Z87891 Personal history of nicotine dependence: Secondary | ICD-10-CM | POA: Diagnosis not present

## 2018-10-13 DIAGNOSIS — K5900 Constipation, unspecified: Secondary | ICD-10-CM

## 2018-10-13 DIAGNOSIS — M25519 Pain in unspecified shoulder: Secondary | ICD-10-CM | POA: Insufficient documentation

## 2018-10-13 DIAGNOSIS — R634 Abnormal weight loss: Secondary | ICD-10-CM | POA: Insufficient documentation

## 2018-10-13 DIAGNOSIS — Z7189 Other specified counseling: Secondary | ICD-10-CM

## 2018-10-13 DIAGNOSIS — I700 Atherosclerosis of aorta: Secondary | ICD-10-CM | POA: Diagnosis not present

## 2018-10-13 DIAGNOSIS — C7951 Secondary malignant neoplasm of bone: Secondary | ICD-10-CM | POA: Diagnosis not present

## 2018-10-13 DIAGNOSIS — R0781 Pleurodynia: Secondary | ICD-10-CM | POA: Insufficient documentation

## 2018-10-13 DIAGNOSIS — J90 Pleural effusion, not elsewhere classified: Secondary | ICD-10-CM

## 2018-10-13 DIAGNOSIS — G893 Neoplasm related pain (acute) (chronic): Secondary | ICD-10-CM | POA: Diagnosis not present

## 2018-10-13 DIAGNOSIS — R05 Cough: Secondary | ICD-10-CM | POA: Insufficient documentation

## 2018-10-13 MED ORDER — INFLUENZA VAC SPLIT QUAD 0.5 ML IM SUSY: 0.5000 mL | PREFILLED_SYRINGE | Freq: Once | INTRAMUSCULAR | Status: DC

## 2018-10-13 MED ORDER — INFLUENZA VAC A&B SA ADJ QUAD 0.5 ML IM PRSY
0.5000 mL | PREFILLED_SYRINGE | INTRAMUSCULAR | Status: AC
  Administered 2018-10-13: 11:00:00 0.5 mL via INTRAMUSCULAR

## 2018-10-13 NOTE — Patient Instructions (Signed)
  Metastatic adenocarcinoma of the lung.

## 2018-10-13 NOTE — Progress Notes (Signed)
[facility]  [address], [address] [place], [place] [address] Phone: [phone]  Fax: [phone]   Clinic day:  10/13/2018  Chief Complaint: Andres Hebert is a 85 y.o. male with stage IV adenocarcinoma of the right lung who is seen for assessment after interval hospitalization.  HPI:  The patient has a 25-30 pack-year smoking history.  He stopped smoking about 45 years ago.  His wife states that he was in his usual state of good health until about 3-4 months ago.  Over this period of time, he has had a poor appetite.  He has lost about 25-30 pounds.    His wife described some dizzy spells and issues with his balance.  He fell about 3 weeks ago.  He was prescribed meclizine without improvement.  His wife notes confusion which started 4 months ago.  He has had some visual hallucinations.  He has said "off-the-wall things".  He has said "I am tired and want to go home" when he is actually home.  He has had no seizures.  He has a cough.  Over the past few weeks, he has had hemoptysis.  He has complained of his ribs hurting on the right side.  He also notes pain in his shoulder.  Because of progressive symptoms, he presented to the emergency room.  He was admitted to [facility] from 10/05/2018 - 10/08/2018 with acute respiratory failure and hypoxia.  CXR on 10/05/2018 revealed a small right pleural effusion with lateral loculated component versus pleural nodularity.  Chest CT angiogram on 10/05/2018 revealed a 8.4 x 6.9 cm right lower lobe mass with numerous pleural nodules and a large right pleural effusion.  There was bony destruction of the right lateral 7th and 8th ribs.  There was no pulmonary embolism.  He underwent ultrasound guided right thoracentesis on 10/06/2018.  1.2 liters of serous fluid was removed.  Cytology confirmed non-small cell carcinoma, favor adenocarcinoma of the lung.  Head MRI on 10/07/2018 revealed no metastatic disease or acute  intracranial abnormality.  There was distal left vertebral artery with poor flow or occlusion, most likely due to atherosclerosis.  CT angio of the head and neck on 10/08/2018 revealed ordinary aortic atherosclerosis and ordinary atherosclerosis at the carotid bifurcations without stenosis.  There was no intracranial anterior circulation finding of significance.  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 Dominant normal appearing right vertebral artery widely patent to the basilar. Tiny left vertebral artery arising directly from the arch, but patent through the neck, supplying left PICA and with a rudimentary contribution to the basilar. This is not likely to be of any clinical relevance.  Symptomatically, he notes that his side hurts at night.  He denies any shortness of breath or pleuritic chest pain.  Appetite remains poor.  He likes chicken noodle soup.  He is drinking 3 Ensure a day.  He has had problems with constipation; stool is hard  He is using Dulcolax.   He is able to take care of his ADLs; he does light housework (washing dishes).    Past Medical History:  Diagnosis Date  . BPV (benign positional vertigo)   . Chronic constipation   . Diabetes mellitus without complication ([place])   . Hyperlipidemia   . Hypertension     History reviewed. No pertinent surgical history.  Family History  Problem Relation Age of Onset  . Stroke Mother   . Prostate cancer Father     Social History:  reports  that he quit smoking about 44 years ago. His smoking use included cigarettes. He has a 25.00 pack-year smoking history. He has never used smokeless tobacco. He reports previous alcohol use. He reports that he does not use drugs.  He has a 25-30 pack-year smoking history.  He stopped smoking about 45 years ago. He denies any exposure to asbestos, radiation or toxins.  He worked for [employer].  He lives in [place].  His wife's name is [name].  The patient is accompanied by his  wife via [place] today.  Allergies:  Allergies  Allergen Reactions  . Accupril [Quinapril Hcl] Other (See Comments)    Reaction:dizziness per MR    Current Medications: Current Outpatient Medications  Medication Sig Dispense Refill  . amLODipine (NORVASC) 5 MG tablet Take 5 mg by mouth daily.    . citalopram (CELEXA) 10 MG tablet Take 1 tablet by mouth daily.    . feeding supplement, ENSURE ENLIVE, (ENSURE ENLIVE) LIQD Take 237 mLs by mouth 3 (three) times daily between meals. 237 mL 0  . oxyCODONE-acetaminophen (PERCOCET/ROXICET) 5-325 MG tablet Take 1 tablet by mouth every 6 (six) hours as needed for moderate pain. 14 tablet 0  . potassium chloride (K-DUR,KLOR-CON) 10 MEQ tablet Take 10 mEq by mouth 2 (two) times daily.    . pravastatin (PRAVACHOL) 40 MG tablet Take 40 mg by mouth daily.    Marland Kitchen senna (SENOKOT) 8.6 MG TABS tablet Take 1 tablet (8.6 mg total) by mouth daily as needed for mild constipation. 30 tablet 0  . triamterene-hydrochlorothiazide (MAXZIDE-25) 37.5-25 MG tablet Take 1 tablet by mouth daily.     No current facility-administered medications for this visit.     Review of Systems:  GENERAL:  Feels better.  Sleeps 12/hrs/day.  No fevers, sweats.  Weight loss of 25-30 pounds in past 3-4 months. PERFORMANCE STATUS (ECOG):  1-2 HEENT:  Runny nose.  No visual changes,sore throat, mouth sores or tenderness. Lungs: Shortness of breath, improved.  Cough.  History of hemoptysis. Cardiac:  No chest pain, palpitations, orthopnea, or PND. GI:  Appetite remains poor.  Constipation.  No nausea, vomiting, diarrhea, melena or hematochezia. GU:  No urgency, frequency, dysuria, or hematuria. Musculoskeletal:  Right rib pain.  No back pain or joint pain.  No muscle tenderness. Extremities:  No pain or swelling. Skin:  No rashes or skin changes. Neuro:  Confusion at times.  No headache, focal numbness or weakness, balance or coordination issues. Endocrine:  No diabetes, thyroid issues,  hot flashes or night sweats. Psych:  No mood changes, depression or anxiety. Pain:  Right sided rib pain. Review of systems:  All other systems reviewed and found to be negative.  Physical Exam: Blood pressure (!) 130/59, pulse 70, temperature 97.7 F (36.5 C), temperature source Tympanic, resp. rate 18, height '5\' 6"'$  (1.676 m), weight 153 lb 12.3 oz (69.7 kg), SpO2 95 %. GENERAL:  Elderly gentleman sitting comfortably in the exam room in no acute distress.  Needs some assistance onto table. MENTAL STATUS:  Alert and oriented to person, place and time.  Knows President and recent events. HEAD:  Pearline Cables hair and beard.  Normocephalic, atraumatic, face symmetric, no Cushingoid features. EYES:  Glasses.  Blue eyes.  Pupils equal round and reactive to light and accomodation.  No conjunctivitis or scleral icterus. ENT:  Oropharynx clear without lesion.  Tongue normal.  Dentures.  Mucous membranes moist.  RESPIRATORY:  Clear to auscultation without rales, wheezes or rhonchi. CARDIOVASCULAR:  Regular rate  and rhythm without murmur, rub or gallop. ABDOMEN:  Soft, non-tender, with active bowel sounds, and no hepatosplenomegaly.  No masses. SKIN:  No rashes, ulcers or lesions. EXTREMITIES: No edema, no skin discoloration or tenderness.  No palpable cords. LYMPH NODES: No palpable cervical, supraclavicular, axillary or inguinal adenopathy  NEUROLOGICAL: Appropriate. PSYCH:  Appropriate.   No visits with results within 3 Day(s) from this visit.  Latest known visit with results is:  Admission on 10/05/2018, Discharged on 10/08/2018  Component Date Value Ref Range Status  . Sodium 10/05/2018 139  135 - 145 mmol/L Final  . Potassium 10/05/2018 3.7  3.5 - 5.1 mmol/L Final  . Chloride 10/05/2018 98  98 - 111 mmol/L Final  . CO2 10/05/2018 29  22 - 32 mmol/L Final  . Glucose, Bld 10/05/2018 156* 70 - 99 mg/dL Final  . BUN 10/05/2018 24* 8 - 23 mg/dL Final  . Creatinine, Ser 10/05/2018 1.16  0.61 - 1.24  mg/dL Final  . Calcium 10/05/2018 9.7  8.9 - 10.3 mg/dL Final  . GFR calc non Af Amer 10/05/2018 57* >60 mL/min Final  . GFR calc Af Amer 10/05/2018 >60  >60 mL/min Final  . Anion gap 10/05/2018 12  5 - 15 Final   Performed at [facility], [address], [place], [place] [address]  . WBC 10/05/2018 8.9  4.0 - 10.5 K/uL Final  . RBC 10/05/2018 4.89  4.22 - 5.81 MIL/uL Final  . Hemoglobin 10/05/2018 15.0  13.0 - 17.0 g/dL Final  . HCT 10/05/2018 45.6  39.0 - 52.0 % Final  . MCV 10/05/2018 93.3  80.0 - 100.0 fL Final  . MCH 10/05/2018 30.7  26.0 - 34.0 pg Final  . MCHC 10/05/2018 32.9  30.0 - 36.0 g/dL Final  . RDW 10/05/2018 12.2  11.5 - 15.5 % Final  . Platelets 10/05/2018 180  150 - 400 K/uL Final  . nRBC 10/05/2018 0.0  0.0 - 0.2 % Final   Performed at [facility], [address] [place], [place] [address]  . Troponin I (High Sensitivity) 10/05/2018 6  <18 ng/L Final   Comment: (NOTE) Elevated high sensitivity troponin I (hsTnI) values and significant  changes across serial measurements may suggest ACS but many other  chronic and acute conditions are known to elevate hsTnI results.  Refer to the "Links" section for chest pain algorithms and additional  guidance. Performed at [facility], [address] [place], [place] [address]   . Troponin I (High Sensitivity) 10/05/2018 6  <18 ng/L Final   Comment: (NOTE) Elevated high sensitivity troponin I (hsTnI) values and significant  changes across serial measurements may suggest ACS but many other  chronic and acute conditions are known to elevate hsTnI results.  Refer to the "Links" section for chest pain algorithms and additional  guidance. Performed at [facility], [address] [place],  [address]   . SARS Coronavirus 2 10/05/2018 NEGATIVE  NEGATIVE Final   Comment: (NOTE) If result is NEGATIVE SARS-CoV-2 target nucleic acids are NOT DETECTED. The SARS-CoV-2 RNA is  generally detectable in upper and lower  respiratory specimens during the acute phase of infection. The lowest  concentration of SARS-CoV-2 viral copies this assay can detect is 250  copies / mL. A negative result does not preclude SARS-CoV-2 infection  and should not be used as the sole basis for treatment or other  patient management decisions.  A negative result may occur with  improper specimen collection /  handling, submission of specimen other  than nasopharyngeal swab, presence of viral mutation(s) within the  areas targeted by this assay, and inadequate number of viral copies  (<250 copies / mL). A negative result must be combined with clinical  observations, patient history, and epidemiological information. If result is POSITIVE SARS-CoV-2 target nucleic acids are DETECTED. The SARS-CoV-2 RNA is generally detectable in upper and lower  respiratory specimens dur                          ing the acute phase of infection.  Positive  results are indicative of active infection with SARS-CoV-2.  Clinical  correlation with patient history and other diagnostic information is  necessary to determine patient infection status.  Positive results do  not rule out bacterial infection or co-infection with other viruses. If result is PRESUMPTIVE POSTIVE SARS-CoV-2 nucleic acids MAY BE PRESENT.   A presumptive positive result was obtained on the submitted specimen  and confirmed on repeat testing.  While 2019 novel coronavirus  (SARS-CoV-2) nucleic acids may be present in the submitted sample  additional confirmatory testing may be necessary for epidemiological  and / or clinical management purposes  to differentiate between  SARS-CoV-2 and other Sarbecovirus currently known to infect humans.  If clinically indicated additional testing with an alternate test  methodology [phone]) is advised. The SARS-CoV-2 RNA is generally  detectable in upper and lower respiratory sp                           ecimens during the acute  phase of infection. The expected result is Negative. Fact Sheet for Patients:  StrictlyIdeas.no Fact Sheet for Healthcare Providers: BankingDealers.co.za This test is not yet approved or cleared by the [place] FDA and has been authorized for detection and/or diagnosis of SARS-CoV-2 by FDA under an Emergency Use Authorization (EUA).  This EUA will remain in effect (meaning this test can be used) for the duration of the COVID-19 declaration under Section 564(b)(1) of the Act, 21 U.S.C. section 360bbb-3(b)(1), unless the authorization is terminated or revoked sooner. Performed at [facility], [address] [place], [place] [address]   . Sodium 10/06/2018 138  135 - 145 mmol/L Final  . Potassium 10/06/2018 3.4* 3.5 - 5.1 mmol/L Final  . Chloride 10/06/2018 101  98 - 111 mmol/L Final  . CO2 10/06/2018 28  22 - 32 mmol/L Final  . Glucose, Bld 10/06/2018 183* 70 - 99 mg/dL Final  . BUN 10/06/2018 23  8 - 23 mg/dL Final  . Creatinine, Ser 10/06/2018 1.06  0.61 - 1.24 mg/dL Final  . Calcium 10/06/2018 9.3  8.9 - 10.3 mg/dL Final  . GFR calc non Af Amer 10/06/2018 >60  >60 mL/min Final  . GFR calc Af Amer 10/06/2018 >60  >60 mL/min Final  . Anion gap 10/06/2018 9  5 - 15 Final   Performed at [facility], [address], [place], [place] [address]  . WBC 10/06/2018 8.9  4.0 - 10.5 K/uL Final  . RBC 10/06/2018 4.61  4.22 - 5.81 MIL/uL Final  . Hemoglobin 10/06/2018 14.0  13.0 - 17.0 g/dL Final  . HCT 10/06/2018 42.6  39.0 - 52.0 % Final  . MCV 10/06/2018 92.4  80.0 - 100.0 fL Final  . MCH 10/06/2018 30.4  26.0 - 34.0 pg Final  . MCHC 10/06/2018 32.9  30.0 - 36.0 g/dL Final  .  RDW 10/06/2018 11.9  11.5 - 15.5 % Final  . Platelets 10/06/2018 165  150 - 400 K/uL Final  . nRBC 10/06/2018 0.0  0.0 - 0.2 % Final   Performed at [facility], [address] [place], [place] [address]  .  CYTOLOGY - NON GYN 10/06/2018    Final-Edited                   Value:CYTOLOGY - NON PAP CASE: [id number] PATIENT: Eloy Schamp Non-Gyn Cytology Report  SPECIMEN SUBMITTED: A. Pleural fluid, right  CLINICAL HISTORY: None provided  PRE-OPERATIVE DIAGNOSIS: None provided  POST-OPERATIVE DIAGNOSIS: None provided.  DIAGNOSIS: A. PLEURAL FLUID, RIGHT; ULTRASOUND-GUIDED THORACENTESIS: - MALIGNANT CELLS PRESENT. - NON-SMALL CELL CARCINOMA, FAVOR ADENOCARCINOMA OF THE LUNG.  Comments: Immunohistochemical studies with appropriately reactive controls were performed. Malignant cells are positive for cytokeratin 7 and TTF-1, and negative for p40. These findings support the above diagnosis.  There is insufficient tissue in the cell block for ancillary studies.  Slides reviewed: 1 ThinPrep, 1 cell block, 1 cytospin  GROSS DESCRIPTION: A. Labeled: Right pleural fluid Received: Fresh Volume: 1000 mL Description: Dark yellow, cloudy fluid in a 1000 mL glass evacuated container Submitted for cell block and ThinPrep.                            Final Diagnosis performed by Allena Napoleon, MD.   Electronically signed 10/10/2018 3:52:24PM The electronic signature indicates that the named Attending Pathologist has evaluated the specimen Technical component performed at [facility], [address] [place], [place] [address] Lab: [phone] Dir: Rush Farmer, MD, MMM  Professional component performed at [facility], [facility], [place], [place], [place] [address] Lab: [phone] Dir: Dellia Nims. Rubinas, MD   . Fluid Type-FCT 10/06/2018 CYTOPLEU   Final  . Color, Fluid 10/06/2018 YELLOW* YELLOW Final  . Appearance, Fluid 10/06/2018 HAZY* CLEAR Final  . Total Nucleated Cell Count, Fluid 10/06/2018 387  cu mm Final  . Neutrophil Count, Fluid 10/06/2018 6  % Final  . Lymphs, Fluid 10/06/2018 79  % Final  . Monocyte-Macrophage-Serous Fluid 10/06/2018 15  % Final  .  Eos, Fluid 10/06/2018 0  % Final   Performed at [facility], [address] [place], [place] [address]  . Specimen Description 10/06/2018    Final                   Value:PLEURAL Performed at [facility], [address] [place], [place] [address]   . Special Requests 10/06/2018    Final                   Value:NONE Performed at [facility], [place], [address] [place], [place] [address]   . Gram Stain 10/06/2018    Final                   Value:NO WBC SEEN NO ORGANISMS SEEN   . Culture 10/06/2018    Final                   Value:NO GROWTH 3 DAYS Performed at [facility], [place] [address], [place], [place] [address]   . Report Status 10/06/2018 10/10/2018 FINAL   Final  . Amylase, Fluid 10/06/2018 56  U/L Final   NO NORMAL RANGE ESTABLISHED FOR THIS TEST  . Fluid Type-FAMY 10/06/2018 CYTOPLEU   Final   Performed at [facility], [address] [place], [place] [address]  . Total protein, fluid 10/06/2018 5.1  g/dL Final   Comment: (NOTE) No normal range established for this test Results should be evaluated in conjunction with serum values   . Fluid Type-FTP 10/06/2018 CYTOPLEU   Final   Performed at [facility], [address] [place], [place] [address]  . Glucose, Fluid 10/06/2018 160  mg/dL Final   Comment: (NOTE) No normal range established for this test Results should be evaluated in conjunction with serum values   . Fluid Type-FGLU 10/06/2018 CYTOPLEU   Final   Performed at [facility], [address] [place], [place] [address]  . Magnesium 10/06/2018 2.0  1.7 - 2.4 mg/dL Final   Performed at [facility], [address] [place], [place] [address]  . Sodium 10/07/2018 141  135 - 145 mmol/L Final  . Potassium 10/07/2018 3.5  3.5 - 5.1 mmol/L Final  . Chloride 10/07/2018 101  98 - 111 mmol/L Final  . CO2 10/07/2018 30  22 - 32 mmol/L Final  . Glucose, Bld 10/07/2018 146* 70 - 99 mg/dL Final   . BUN 10/07/2018 35* 8 - 23 mg/dL Final  . Creatinine, Ser 10/07/2018 1.07  0.61 - 1.24 mg/dL Final  . Calcium 10/07/2018 9.3  8.9 - 10.3 mg/dL Final  . GFR calc non Af Amer 10/07/2018 >60  >60 mL/min Final  . GFR calc Af Amer 10/07/2018 >60  >60 mL/min Final  . Anion gap 10/07/2018 10  5 - 15 Final   Performed at [facility], [address], [place], [place] [address]  . Hemoglobin 10/07/2018 13.1  13.0 - 17.0 g/dL Final   Performed at [facility], [address] [place], [place] [address]  . Sodium 10/08/2018 138  135 - 145 mmol/L Final  . Potassium 10/08/2018 4.1  3.5 - 5.1 mmol/L Final  . Chloride 10/08/2018 99  98 - 111 mmol/L Final  . CO2 10/08/2018 31  22 - 32 mmol/L Final  . Glucose, Bld 10/08/2018 157* 70 - 99 mg/dL Final  . BUN 10/08/2018 34* 8 - 23 mg/dL Final  . Creatinine, Ser 10/08/2018 0.93  0.61 - 1.24 mg/dL Final  . Calcium 10/08/2018 9.3  8.9 - 10.3 mg/dL Final  . GFR calc non Af Amer 10/08/2018 >60  >60 mL/min Final  . GFR calc Af Amer 10/08/2018 >60  >60 mL/min Final  . Anion gap 10/08/2018 8  5 - 15 Final   Performed at [facility], [address], [place], [place] [address]  . WBC 10/08/2018 7.5  4.0 - 10.5 K/uL Final  . RBC 10/08/2018 4.28  4.22 - 5.81 MIL/uL Final  . Hemoglobin 10/08/2018 13.2  13.0 - 17.0 g/dL Final  . HCT 10/08/2018 39.6  39.0 - 52.0 % Final  . MCV 10/08/2018 92.5  80.0 - 100.0 fL Final  . MCH 10/08/2018 30.8  26.0 - 34.0 pg Final  . MCHC 10/08/2018 33.3  30.0 - 36.0 g/dL Final  . RDW 10/08/2018 12.2  11.5 - 15.5 % Final  . Platelets 10/08/2018 165  150 - 400 K/uL Final  . nRBC 10/08/2018 0.0  0.0 - 0.2 % Final   Performed at [facility], [address] [place], [place] [address]  . Magnesium 10/08/2018 2.2  1.7 - 2.4 mg/dL Final   Performed at [facility], [address] [place], [place] [address]    Assessment:  Loretta Kluender [facility]  is a 85 y.o. male with metastatic adenocarcinoma  of the lung s/p right sided thoracentesis on 10/06/2018.  Cytology confirmed non-small cell carcinoma, favor adenocarcinoma of the lung.  He has a 25-30 pack year smoking history.  He has clinical stage T4NxM1b.  Chest CT angiogram on 10/05/2018 revealed a 8.4 x 6.9 cm right lower lobe mass with numerous pleural nodules and a large right pleural effusion.  There was bony destruction of the right lateral 7th and 8th ribs.  There was no pulmonary embolism.  Head MRI on 10/07/2018 revealed no metastatic disease or acute intracranial abnormality.  There was distal left vertebral artery with poor flow or occlusion, most likely due to atherosclerosis.  Symptomatically, he is breathing comfortably.  Appetite remains poor, although he is drinking Ensure 3/day.  He is sleeping poorly and resting in a chair during the day.  He has some right rib discomfort.  Exam is stable.  Plan:   1.   Stage IV adenocarcinoma of the lung             Review initial chest CT angiogram.   Imaging personally reviewed with patient and his wife.  Review pathology from right sided pleural effusion.  Discuss staging.  Patient has stage IV disease.   Treatment is palliative.  Contact pathology regarding ancillary studies- [facility].   Not enough tissue for PDL-1 or [facility].  As there was insufficient material for testing, consider sending blood for NGS and assessing other potential biopsy sites on PET scan.   Patient and his wife are agreeable to biopsy.  Treatment options based on mutational analaysis (EGFR, ALK, ROS1, BRAF, MET, RET, PDL-1).   If targetable mutation- directed therapy.   If no targetable mutation and PDL-1 > 50% consider pembrolizumab alone.   If no targetable mutation and PDL-1 < 50% or unknown    consider carboplatin + Alimta + pembrolizumab.             Schedule PET scan on 10/17/2018 to assess extent of disease and potential biopsy site.  Present at tumor board on 10/19/2018. 2.  Weight  loss  Patient's appetite is poor, but is drinking Ensure.  Consult nutrition.  Ensure/Boost samples at next visit.  Consider appetite stimulant in future if he continues to lose weight. 3.   Bone metastasis  Patient with rib metastasis seen on chest CT.  Assess for other sites of metastasis with bone scan.  Consider Xgeva to prevent bone related events. 4.   Cancer-related pain  Pain appears well controlled.  Patient currently taking Percocet 5-325 1 tablet every 6 hours prn pain. 5.   Constipation  Discuss management of constipation.  Discuss stool softeners, laxatives, Miralax, MOM, fruits and juices that begin with P (peaches, plums, pineapples, prunes).  Improvement in constipation should help with appetite. 6.   Health maintenance  Patient's wife requests influenza vaccine today.  Patient in agreement. 7.   MD to schedule biopsy based on PET scan. 8.   RTC in 7-10 days for MD assessment and review of work-up to date.  I discussed the assessment and treatment plan with the patient.  The patient was provided an opportunity to ask questions and all were answered.  The patient agreed with the plan and demonstrated an understanding of the instructions.  The patient was advised to call back or seek an in person evaluation if the symptoms worsen or if the condition fails to improve as anticipated.   Lequita Asal, MD, PhD  10/13/2018, 9:49 AM

## 2018-10-18 ENCOUNTER — Encounter
Admission: RE | Admit: 2018-10-18 | Discharge: 2018-10-18 | Disposition: A | Discharge status: Home / Self Care | Payer: Medicare HMO | Source: Ambulatory Visit | Attending: Hematology and Oncology | Admitting: Hematology and Oncology

## 2018-10-18 ENCOUNTER — Other Ambulatory Visit: Payer: Self-pay

## 2018-10-18 DIAGNOSIS — C3431 Malignant neoplasm of lower lobe, right bronchus or lung: Secondary | ICD-10-CM | POA: Diagnosis not present

## 2018-10-18 DIAGNOSIS — E119 Type 2 diabetes mellitus without complications: Secondary | ICD-10-CM | POA: Insufficient documentation

## 2018-10-18 DIAGNOSIS — C782 Secondary malignant neoplasm of pleura: Secondary | ICD-10-CM | POA: Insufficient documentation

## 2018-10-18 DIAGNOSIS — E785 Hyperlipidemia, unspecified: Secondary | ICD-10-CM | POA: Insufficient documentation

## 2018-10-18 DIAGNOSIS — Z79899 Other long term (current) drug therapy: Secondary | ICD-10-CM | POA: Insufficient documentation

## 2018-10-18 DIAGNOSIS — Z7982 Long term (current) use of aspirin: Secondary | ICD-10-CM | POA: Insufficient documentation

## 2018-10-18 DIAGNOSIS — I10 Essential (primary) hypertension: Secondary | ICD-10-CM | POA: Insufficient documentation

## 2018-10-18 DIAGNOSIS — Z87891 Personal history of nicotine dependence: Secondary | ICD-10-CM | POA: Insufficient documentation

## 2018-10-18 LAB — GLUCOSE, CAPILLARY: Glucose-Capillary: 150 mg/dL — ABNORMAL HIGH (ref 70–99)

## 2018-10-18 MED ORDER — FLUDEOXYGLUCOSE F - 18 (FDG) INJECTION
8.0000 | Freq: Once | INTRAVENOUS | Status: AC | PRN
  Administered 2018-10-18: 10:00:00 8 via INTRAVENOUS

## 2018-10-19 ENCOUNTER — Other Ambulatory Visit: Payer: Medicare HMO

## 2018-10-19 NOTE — Progress Notes (Signed)
[facility]  [address], [address] [place], [place] [address] Phone: [phone]  Fax: [phone]   Clinic Day:  10/24/2018  Referring physician: [name], MD  Chief Complaint: Andres Hebert is a 85 y.o. male with stage IV adenocarcinoma of the right lung who is seen for review of work-up and discussion regarding direction of therapy.   HPI: The patient was last seen in the medical oncology clinic after interval hospitalization on 10/13/2018. At that time, he was breathing comfortably. Appetite remained poor; he was drinking Ensure 3/day.  He was sleeping poorly and resting in a chair during the day. He described right rib discomfort.  He was taking Percocet every 6 hours prn pain.  Exam was stable. He received the influenza vaccine.   PET scan on 10/18/2018 revealed a large hypermetabolic RIGHT lower lobe mass consistent with bronchogenic carcinoma. There was multifocal pleural metastasis within the RIGHT hemithorax. There was one pleural metastatic lesion invading adjacent RIGHT seventh rib, and moderate RIGHT effusion. There was probable RIGHT hilar metastatic adenopathy, and no mediastinal or supraclavicular metastatic adenopathy.    As there was insufficient material for mutational testing on the pleural fluid, we discussed a biopsy based on PET scan. Patient was presented at tumor board on 10/19/2018. Biopsy of a pleural based nodule was recommended.  The patient's wife was contacted on 10/20/2018.   During the interim, the patient stated "I'm in pain". He has had RUQ abdominal pain for 4 weeks. Pain is increased with deep breathing.  He denies any shortness of breath or chest pain.  He is taking oxycodone at bed time only for pain.  He was encouraged to take pain medications during the day and to keep a pain dairy. Patient and his wife declined consult with Altha Harm, NP. He continues to have some blood streaked phlegm.   He notes his wife has to  assist him when getting up or walking around the house.  He feels that he could fall if he is not careful.  He has shortness of breath on exertion. He has a poor appetite. The patient and the wife are interested in speaking with a nutritionist.    Past Medical History:  Diagnosis Date  . BPV (benign positional vertigo)   . Chronic constipation   . Diabetes mellitus without complication ([place])   . Hyperlipidemia   . Hypertension     History reviewed. No pertinent surgical history.  Family History  Problem Relation Age of Onset  . Stroke Mother   . Prostate cancer Father     Social History:  reports that he quit smoking about 44 years ago. His smoking use included cigarettes. He has a 25.00 pack-year smoking history. He has never used smokeless tobacco. He reports previous alcohol use. He reports that he does not use drugs. Hehas a 25-30 pack-year smoking history.He stoppedsmoking about 45 years ago.He denies any exposure to asbestos, radiation or toxins. He worked for [employer].  He lives in [place]. His wife's name is [name]. The patient is accompanied by his wife via Facetime today.  Allergies:  Allergies  Allergen Reactions  . Accupril [Quinapril Hcl] Other (See Comments)    Reaction:dizziness per MR    Current Medications: Current Outpatient Medications  Medication Sig Dispense Refill  . amLODipine (NORVASC) 5 MG tablet Take 5 mg by mouth daily.    . citalopram (CELEXA) 10 MG tablet Take 1 tablet by mouth daily.    . feeding supplement, ENSURE  ENLIVE, (ENSURE ENLIVE) LIQD Take 237 mLs by mouth 3 (three) times daily between meals. 237 mL 0  . oxyCODONE-acetaminophen (PERCOCET/ROXICET) 5-325 MG tablet Take 1 tablet by mouth every 6 (six) hours as needed for moderate pain. 14 tablet 0  . potassium chloride (K-DUR,KLOR-CON) 10 MEQ tablet Take 10 mEq by mouth 2 (two) times daily.    . pravastatin (PRAVACHOL) 40 MG tablet Take 40 mg by mouth daily.    Marland Kitchen senna (SENOKOT) 8.6  MG TABS tablet Take 1 tablet (8.6 mg total) by mouth daily as needed for mild constipation. 30 tablet 0  . triamterene-hydrochlorothiazide (MAXZIDE-25) 37.5-25 MG tablet Take 1 tablet by mouth daily.     No current facility-administered medications for this visit.     Review of Systems  Constitutional: Positive for weight loss (4 pounds). Negative for chills, diaphoresis, fever and malaise/fatigue.       "I'm in pain".  HENT: Negative.  Negative for congestion, ear pain, hearing loss, nosebleeds, sinus pain and sore throat.   Eyes: Negative.  Negative for blurred vision, double vision and photophobia.  Respiratory: Positive for cough, hemoptysis (x 2-3 months), sputum production (phlegm x 3-4 months) and shortness of breath (on exertion).   Cardiovascular: Negative.  Negative for chest pain, palpitations and leg swelling.  Gastrointestinal: Positive for abdominal pain (RUQ x 4 weeks). Negative for blood in stool, constipation, diarrhea, melena, nausea and vomiting.       Poor appetite.  Genitourinary: Negative.  Negative for dysuria, frequency, hematuria and urgency.  Musculoskeletal: Negative for back pain, joint pain, myalgias and neck pain.       Right lower rib pain.  Skin: Negative.  Negative for itching and rash.  Neurological: Negative for dizziness, tingling, sensory change, speech change, focal weakness, seizures, weakness and headaches.       Confusion at times.  Endo/Heme/Allergies: Negative.  Does not bruise/bleed easily.  Psychiatric/Behavioral: Negative for depression and memory loss. The patient has insomnia. The patient is not nervous/anxious.   All other systems reviewed and are negative.  Performance status (ECOG):  1-2  Vitals Blood pressure 139/64, pulse 79, temperature 97.8 F (36.6 C), temperature source Tympanic, height _0  (1.676 m), weight 149 lb 14.6 oz (68 kg), SpO2 94 %.  Physical Exam  Constitutional: He is oriented to person, place, and time. He appears  well-developed and well-nourished. No distress.  Elderly gentleman requires assistance onto table.  HENT:  Head: Normocephalic and atraumatic.  Mouth/Throat: Oropharynx is clear and moist. No oropharyngeal exudate.  Gray hair and beard. Dentures.  Eyes: Pupils are equal, round, and reactive to light. Conjunctivae and EOM are normal. No scleral icterus.  Glasses. Blue eyes.  Neck: Normal range of motion. Neck supple. No JVD present.  Cardiovascular: Normal rate, regular rhythm and normal heart sounds.  No murmur heard. Pulmonary/Chest: Effort normal and breath sounds normal. No respiratory distress. He has no wheezes. He has no rales. He exhibits no tenderness.  Decreased breath sounds 1/3 way up on the right.  Abdominal: Soft. Bowel sounds are normal. He exhibits no distension. There is abdominal tenderness (mild RUQ).  Musculoskeletal: Normal range of motion.        General: No tenderness or edema.     Comments: Pain in right lower ribs increased with position change.  Lymphadenopathy:    He has no cervical adenopathy.    He has no axillary adenopathy.       Right: No supraclavicular adenopathy present.  Left: No supraclavicular adenopathy present.  Neurological: He is alert and oriented to person, place, and time.  Skin: Skin is warm and dry. He is not diaphoretic.  Psychiatric: He has a normal mood and affect. His behavior is normal. Judgment and thought content normal.  Nursing note and vitals reviewed.    No visits with results within 3 Day(s) from this visit.  Latest known visit with results is:  Hospital Outpatient Visit on 10/18/2018  Component Date Value Ref Range Status  . Glucose-Capillary 10/18/2018 150* 70 - 99 mg/dL Final    Assessment:  Andres Hebert is a 85 y.o. male with metastatic adenocarcinoma of the lung s/p right sided thoracentesis on 10/06/2018.  Cytology confirmed non-small cell carcinoma, favor adenocarcinoma of the lung.  He has a 25-30 pack year  smoking history.  He has clinical stage T4NxM1b.  Chest CT angiogramon 10/05/2018 revealed a 8.4 x 6.9 cm right lower lobe mass with numerous pleural nodules and a large right pleural effusion. There was bony destruction of the right lateral 7th and 8th ribs. There was no pulmonary embolism.  Head MRI on 10/07/2018 revealed no metastatic disease or acute intracranial abnormality.  There was distal left vertebral artery with poor flow or occlusion, most likely due to atherosclerosis.  PET scan on 10/18/2018 revealed a large hypermetabolic RIGHT lower lobe mass consistent with bronchogenic carcinoma. There was multifocal pleural metastasis within the RIGHT hemithorax. There was one pleural metastatic lesion invading adjacent RIGHT seventh rib, and moderate RIGHT effusion. There was probable RIGHT hilar metastatic adenopathy, and no mediastinal or supraclavicular metastatic adenopathy.    He received the influenza vaccine on 10/13/2018.  Symptomatically, has mild RUQ pain and mild/moderate right lower rib pain.  He has shortness of breath on exertion.  He continues to have hemoptysis.  Plan: 1.   Stage IV adenocarcinoma of the lung  Review PET scan.  Imaging personally reviewed.   He has a large right lower lobe mass with multi-focal pleural based nodules.   He has tumor invading the right 7th rib.   He has a malignant pleural effusion.  Patient has stage IV disease.  Treatment is palliative.  Head MRI is negative.  Discuss tumor board conversation.  Patient agreeable to pleural based nodule biopsy (order placed last week).  Discuss awaiting Foundation One biopsy results or proceeding with one cycle of chemotherapy.   If decision to proceed with chemotherapy, obtain biopsy first.   Discuss potential switch in therapy after first cycle of chemotherapy if actionable mutation.  Patient states he wishes to proceed with treatment.    Discuss carboplatin, Alimta, and pembrolizumab.   Potential  side effects reviewed.   Information provided.   B12 today and begin daily folic acid in anticipation of Alimta.   Rx:  folic acid 1 mg a day.             Future treatment options based on mutational analaysis (EGFR, ALK, ROS1, BRAF, MET, RET, PDL-1).                         If targetable mutation- directed therapy.                         If no targetable mutation and PDL-1 > 50% consider pembrolizumab alone.                         If  no targetable mutation and PDL-1 < 50% or unknown                                     consider carboplatin + Alimta + pembrolizumab. Discuss port-a-cath placement.   Nurses to assess veins.   Port-a-cath would be needed for IV treatment.  Discuss referral to Altha Harm, NP, palliative care.   Patient wishes to postpone.  Schedule chemotherapy class.  Preauth carboplatin, Alimta, pembrolizumab. 3.  Weight loss             Patient's appetite remains poor.             Increase Ensure from 2 cans/day to 3 cans/day.  Discuss appetite stimulant.              Patient wishes to postpone Megace.  Nutrition consult.  Ensure/Boost samples. 4.   Bone metastasis             Patient with rib metastasis seen on chest CT.             Assess for other sites of metastasis with bone scan.             Consider Xgeva to prevent bone related events. 5.   Cancer-related pain             Pain poorly controlled during the day as he is only taking pain medications at night.  Encourage Percocet 5-325 1 tablet every 4 hours prn pain.  Rx:  Percocet (dis :#30). 6.   Constipation             Discuss management of constipation.             Discuss stool softeners, laxatives, Miralax, MOM, fruits and juices that begin with P (peaches, plums, pineapples, prunes).             Avoid suppositories secondary to the risk of bacteremia when neutropenic. 7.   Pleural effusion  Clinically, pleural fluid appears stable.  CXR today to assess pleural fluid.  Discuss  potential thoracentesis for symptomatic shortness of breath. 8.   RTC next week for MD assessment, labs (CBC with diff, CMP, Mg, LDH, TSH, free T4), and cycle #1 carbo, Alimta and pembrolizumab.  I discussed the assessment and treatment plan with the patient.  The patient was provided an opportunity to ask questions and all were answered.  The patient agreed with the plan and demonstrated an understanding of the instructions.  The patient was advised to call back if the symptoms worsen or if the condition fails to improve as anticipated.  I provided 24 minutes of face-to-face time during this this encounter and > 50% was spent counseling as documented under my assessment and plan.    Lequita Asal, MD, PhD    10/24/2018, 9:27 AM  I, Selena Batten, am acting as scribe for [employer]. Mike Gip, MD, PhD.  I, Melissa C. Mike Gip, MD, have reviewed the above documentation for accuracy and completeness, and I agree with the above.

## 2018-10-20 ENCOUNTER — Telehealth: Payer: Self-pay | Admitting: Hematology and Oncology

## 2018-10-20 ENCOUNTER — Other Ambulatory Visit: Payer: Self-pay | Admitting: Hematology and Oncology

## 2018-10-20 DIAGNOSIS — R222 Localized swelling, mass and lump, trunk: Secondary | ICD-10-CM

## 2018-10-20 DIAGNOSIS — C3431 Malignant neoplasm of lower lobe, right bronchus or lung: Secondary | ICD-10-CM

## 2018-10-20 NOTE — Telephone Encounter (Signed)
Re:  Tumor board discussions  Today I spoke with the patient's wife regarding our tumor board discussions from yesterday.    Not enough tissue was available from his thoracentesis for PDL-1 or Foundation One.  PET scan reveals the pleural-based chest wall nodule appears to be the best candidate for biopsy.  This was discussed with the patient's wife who was agreeable to the biopsy for her husband.  Biopsy will be scheduled.   Lequita Asal, MD

## 2018-10-20 NOTE — Progress Notes (Signed)
Tumor Board Documentation  Andres Hebert was presented by Dr Mike Gip at our Tumor Board on 10/19/2018, which included representatives from medical oncology, radiation oncology, pathology, radiology, surgical, internal medicine, navigation, genetics, pulmonology, palliative care, research.  Andres Hebert currently presents as a new patient, for [place], for new positive pathology with history of the following treatments: surgical intervention(s).  Additionally, we reviewed previous medical and familial history, history of present illness, and recent lab results along with all available histopathologic and imaging studies. The tumor board considered available treatment options and made the following recommendations: Additional screening, Biopsy Get another biopsy for addtional tissue to send for further testing  The following procedures/referrals were also placed: No orders of the defined types were placed in this encounter.   Clinical Trial Status: not discussed   Staging used: AJCC Stage Group  AJCC Staging: T: 4 N: X M: 1b Group: Stage 4 Lung Cancer   National site-specific guidelines NCCN were discussed with respect to the case.  Tumor board is a meeting of clinicians from various specialty areas who evaluate and discuss patients for whom a multidisciplinary approach is being considered. Final determinations in the plan of care are those of the provider(s). The responsibility for follow up of recommendations given during tumor board is that of the provider.   Today's extended care, comprehensive team conference, Andres Hebert was not present for the discussion and was not examined.   Multidisciplinary Tumor Board is a multidisciplinary case peer review process.  Decisions discussed in the Multidisciplinary Tumor Board reflect the opinions of the specialists present at the conference without having examined the patient.  Ultimately, treatment and diagnostic decisions rest with the primary  provider(s) and the patient.

## 2018-10-23 NOTE — Progress Notes (Signed)
Confirmed Name, DOB, and Address. Completed assessment with assistance of wife. Denies any concerns. Reports he continues to cough up bright red blood with phlegm x 3-4 months.

## 2018-10-24 ENCOUNTER — Inpatient Hospital Stay: Payer: Medicare HMO

## 2018-10-24 ENCOUNTER — Encounter: Payer: Self-pay | Admitting: Hematology and Oncology

## 2018-10-24 ENCOUNTER — Ambulatory Visit
Admission: RE | Admit: 2018-10-24 | Discharge: 2018-10-24 | Disposition: A | Discharge status: Home / Self Care | Payer: Medicare HMO | Source: Ambulatory Visit | Attending: Hematology and Oncology | Admitting: Hematology and Oncology

## 2018-10-24 ENCOUNTER — Ambulatory Visit
Admission: RE | Admit: 2018-10-24 | Discharge: 2018-10-24 | Disposition: A | Discharge status: Home / Self Care | Payer: Medicare HMO | Attending: Hematology and Oncology | Admitting: Hematology and Oncology

## 2018-10-24 ENCOUNTER — Other Ambulatory Visit: Payer: Self-pay

## 2018-10-24 ENCOUNTER — Telehealth: Payer: Self-pay

## 2018-10-24 ENCOUNTER — Inpatient Hospital Stay (HOSPITAL_BASED_OUTPATIENT_CLINIC_OR_DEPARTMENT_OTHER): Payer: Medicare HMO | Admitting: Hematology and Oncology

## 2018-10-24 VITALS — BP 139/64 | HR 79 | Temp 97.8°F | Ht 66.0 in | Wt 149.9 lb

## 2018-10-24 DIAGNOSIS — C7951 Secondary malignant neoplasm of bone: Secondary | ICD-10-CM

## 2018-10-24 DIAGNOSIS — Z7189 Other specified counseling: Secondary | ICD-10-CM

## 2018-10-24 DIAGNOSIS — C3431 Malignant neoplasm of lower lobe, right bronchus or lung: Secondary | ICD-10-CM | POA: Insufficient documentation

## 2018-10-24 DIAGNOSIS — J90 Pleural effusion, not elsewhere classified: Secondary | ICD-10-CM

## 2018-10-24 DIAGNOSIS — R634 Abnormal weight loss: Secondary | ICD-10-CM

## 2018-10-24 DIAGNOSIS — G893 Neoplasm related pain (acute) (chronic): Secondary | ICD-10-CM | POA: Diagnosis not present

## 2018-10-24 MED ORDER — CYANOCOBALAMIN 1000 MCG/ML IJ SOLN
1000.0000 ug | Freq: Once | INTRAMUSCULAR | Status: AC
  Administered 2018-10-24: 10:00:00 1000 ug via INTRAMUSCULAR

## 2018-10-24 MED ORDER — OXYCODONE-ACETAMINOPHEN 5-325 MG PO TABS: 1.0000 | ORAL_TABLET | ORAL | 0 refills | Status: DC | PRN

## 2018-10-24 MED ORDER — FOLIC ACID 1 MG PO TABS: 1.0000 mg | ORAL_TABLET | Freq: Every day | ORAL | 1 refills | Status: DC

## 2018-10-24 NOTE — Progress Notes (Signed)
With increase pain noted to right side of abdomen / patient reports pain with taking a deep breathe

## 2018-10-24 NOTE — Patient Instructions (Signed)

## 2018-10-24 NOTE — Patient Instructions (Signed)
Carboplatin injection What is this medicine? CARBOPLATIN (KAR boe pla tin) is a chemotherapy drug. It targets fast dividing cells, like cancer cells, and causes these cells to die. This medicine is used to treat ovarian cancer and many other cancers. This medicine may be used for other purposes; ask your health care provider or pharmacist if you have questions. COMMON BRAND NAME(S): Paraplatin What should I tell my health care provider before I take this medicine? They need to know if you have any of these conditions:  blood disorders  hearing problems  kidney disease  recent or ongoing radiation therapy  an unusual or allergic reaction to carboplatin, cisplatin, other chemotherapy, other medicines, foods, dyes, or preservatives  pregnant or trying to get pregnant  breast-feeding How should I use this medicine? This drug is usually given as an infusion into a vein. It is administered in a hospital or clinic by a specially trained health care professional. Talk to your pediatrician regarding the use of this medicine in children. Special care may be needed. Overdosage: If you think you have taken too much of this medicine contact a poison control center or emergency room at once. NOTE: This medicine is only for you. Do not share this medicine with others. What if I miss a dose? It is important not to miss a dose. Call your doctor or health care professional if you are unable to keep an appointment. What may interact with this medicine?  medicines for seizures  medicines to increase blood counts like filgrastim, pegfilgrastim, sargramostim  some antibiotics like amikacin, gentamicin, neomycin, streptomycin, tobramycin  vaccines Talk to your doctor or health care professional before taking any of these medicines:  acetaminophen  aspirin  ibuprofen  ketoprofen  naproxen This list may not describe all possible interactions. Give your health care provider a list of all the  medicines, herbs, non-prescription drugs, or dietary supplements you use. Also tell them if you smoke, drink alcohol, or use illegal drugs. Some items may interact with your medicine. What should I watch for while using this medicine? Your condition will be monitored carefully while you are receiving this medicine. You will need important blood work done while you are taking this medicine. This drug may make you feel generally unwell. This is not uncommon, as chemotherapy can affect healthy cells as well as cancer cells. Report any side effects. Continue your course of treatment even though you feel ill unless your doctor tells you to stop. In some cases, you may be given additional medicines to help with side effects. Follow all directions for their use. Call your doctor or health care professional for advice if you get a fever, chills or sore throat, or other symptoms of a cold or flu. Do not treat yourself. This drug decreases your body's ability to fight infections. Try to avoid being around people who are sick. This medicine may increase your risk to bruise or bleed. Call your doctor or health care professional if you notice any unusual bleeding. Be careful brushing and flossing your teeth or using a toothpick because you may get an infection or bleed more easily. If you have any dental work done, tell your dentist you are receiving this medicine. Avoid taking products that contain aspirin, acetaminophen, ibuprofen, naproxen, or ketoprofen unless instructed by your doctor. These medicines may hide a fever. Do not become pregnant while taking this medicine. Women should inform their doctor if they wish to become pregnant or think they might be pregnant. There is a  potential for serious side effects to an unborn child. Talk to your health care professional or pharmacist for more information. Do not breast-feed an infant while taking this medicine. What side effects may I notice from receiving this  medicine? Side effects that you should report to your doctor or health care professional as soon as possible:  allergic reactions like skin rash, itching or hives, swelling of the face, lips, or tongue  signs of infection - fever or chills, cough, sore throat, pain or difficulty passing urine  signs of decreased platelets or bleeding - bruising, pinpoint red spots on the skin, black, tarry stools, nosebleeds  signs of decreased red blood cells - unusually weak or tired, fainting spells, lightheadedness  breathing problems  changes in hearing  changes in vision  chest pain  high blood pressure  low blood counts - This drug may decrease the number of white blood cells, red blood cells and platelets. You may be at increased risk for infections and bleeding.  nausea and vomiting  pain, swelling, redness or irritation at the injection site  pain, tingling, numbness in the hands or feet  problems with balance, talking, walking  trouble passing urine or change in the amount of urine Side effects that usually do not require medical attention (report to your doctor or health care professional if they continue or are bothersome):  hair loss  loss of appetite  metallic taste in the mouth or changes in taste This list may not describe all possible side effects. Call your doctor for medical advice about side effects. You may report side effects to FDA at 1-800-FDA-1088. Where should I keep my medicine? This drug is given in a hospital or clinic and will not be stored at home. NOTE: This sheet is a summary. It may not cover all possible information. If you have questions about this medicine, talk to your doctor, pharmacist, or health care provider.  2020 [employer] Standard (2007-04-18 14:38:05)  Pemetrexed injection What is this medicine? PEMETREXED (PEM e TREX ed) is a chemotherapy drug used to treat lung cancers like non-small cell lung cancer and mesothelioma. It may also be  used to treat other cancers. This medicine may be used for other purposes; ask your health care provider or pharmacist if you have questions. COMMON BRAND NAME(S): Alimta What should I tell my health care provider before I take this medicine? They need to know if you have any of these conditions:  infection (especially a virus infection such as chickenpox, cold sores, or herpes)  kidney disease  low blood counts, like low white cell, platelet, or red cell counts  lung or breathing disease, like asthma  radiation therapy  an unusual or allergic reaction to pemetrexed, other medicines, foods, dyes, or preservative  pregnant or trying to get pregnant  breast-feeding How should I use this medicine? This drug is given as an infusion into a vein. It is administered in a hospital or clinic by a specially trained health care professional. Talk to your pediatrician regarding the use of this medicine in children. Special care may be needed. Overdosage: If you think you have taken too much of this medicine contact a poison control center or emergency room at once. NOTE: This medicine is only for you. Do not share this medicine with others. What if I miss a dose? It is important not to miss your dose. Call your doctor or health care professional if you are unable to keep an appointment. What may interact with this  medicine? This medicine may interact with the following medications:  Ibuprofen This list may not describe all possible interactions. Give your health care provider a list of all the medicines, herbs, non-prescription drugs, or dietary supplements you use. Also tell them if you smoke, drink alcohol, or use illegal drugs. Some items may interact with your medicine. What should I watch for while using this medicine? Visit your doctor for checks on your progress. This drug may make you feel generally unwell. This is not uncommon, as chemotherapy can affect healthy cells as well as cancer  cells. Report any side effects. Continue your course of treatment even though you feel ill unless your doctor tells you to stop. In some cases, you may be given additional medicines to help with side effects. Follow all directions for their use. Call your doctor or health care professional for advice if you get a fever, chills or sore throat, or other symptoms of a cold or flu. Do not treat yourself. This drug decreases your body's ability to fight infections. Try to avoid being around people who are sick. This medicine may increase your risk to bruise or bleed. Call your doctor or health care professional if you notice any unusual bleeding. Be careful brushing and flossing your teeth or using a toothpick because you may get an infection or bleed more easily. If you have any dental work done, tell your dentist you are receiving this medicine. Avoid taking products that contain aspirin, acetaminophen, ibuprofen, naproxen, or ketoprofen unless instructed by your doctor. These medicines may hide a fever. Call your doctor or health care professional if you get diarrhea or mouth sores. Do not treat yourself. To protect your kidneys, drink water or other fluids as directed while you are taking this medicine. Do not become pregnant while taking this medicine or for 6 months after stopping it. Women should inform their doctor if they wish to become pregnant or think they might be pregnant. Men should not father a child while taking this medicine and for 3 months after stopping it. This may interfere with the ability to father a child. You should talk to your doctor or health care professional if you are concerned about your fertility. There is a potential for serious side effects to an unborn child. Talk to your health care professional or pharmacist for more information. Do not breast-feed an infant while taking this medicine or for 1 week after stopping it. What side effects may I notice from receiving this  medicine? Side effects that you should report to your doctor or health care professional as soon as possible:  allergic reactions like skin rash, itching or hives, swelling of the face, lips, or tongue  breathing problems  redness, blistering, peeling or loosening of the skin, including inside the mouth  signs and symptoms of bleeding such as bloody or black, tarry stools; red or dark-brown urine; spitting up blood or brown material that looks like coffee grounds; red spots on the skin; unusual bruising or bleeding from the eye, gums, or nose  signs and symptoms of infection like fever or chills; cough; sore throat; pain or trouble passing urine  signs and symptoms of kidney injury like trouble passing urine or change in the amount of urine  signs and symptoms of liver injury like dark yellow or brown urine; general ill feeling or flu-like symptoms; light-colored stools; loss of appetite; nausea; right upper belly pain; unusually weak or tired; yellowing of the eyes or skin Side effects that usually  do not require medical attention (report to your doctor or health care professional if they continue or are bothersome):  constipation  mouth sores  nausea, vomiting  unusually weak or tired This list may not describe all possible side effects. Call your doctor for medical advice about side effects. You may report side effects to FDA at 1-800-FDA-1088. Where should I keep my medicine? This drug is given in a hospital or clinic and will not be stored at home. NOTE: This sheet is a summary. It may not cover all possible information. If you have questions about this medicine, talk to your doctor, pharmacist, or health care provider.  2020 [employer]/Gold Standard (2017-03-02 16:11:33)  Pembrolizumab injection What is this medicine? PEMBROLIZUMAB (pem broe liz ue mab) is a monoclonal antibody. It is used to treat bladder cancer, cervical cancer, endometrial cancer, esophageal cancer, head and  neck cancer, hepatocellular cancer, Hodgkin lymphoma, kidney cancer, lymphoma, melanoma, Merkel cell carcinoma, lung cancer, stomach cancer, urothelial cancer, and cancers that have a certain genetic condition. This medicine may be used for other purposes; ask your health care provider or pharmacist if you have questions. COMMON BRAND NAME(S): Keytruda What should I tell my health care provider before I take this medicine? They need to know if you have any of these conditions:  diabetes  immune system problems  inflammatory bowel disease  liver disease  lung or breathing disease  lupus  received or scheduled to receive an organ transplant or a stem-cell transplant that uses donor stem cells  an unusual or allergic reaction to pembrolizumab, other medicines, foods, dyes, or preservatives  pregnant or trying to get pregnant  breast-feeding How should I use this medicine? This medicine is for infusion into a vein. It is given by a health care professional in a hospital or clinic setting. A special MedGuide will be given to you before each treatment. Be sure to read this information carefully each time. Talk to your pediatrician regarding the use of this medicine in children. While this drug may be prescribed for selected conditions, precautions do apply. Overdosage: If you think you have taken too much of this medicine contact a poison control center or emergency room at once. NOTE: This medicine is only for you. Do not share this medicine with others. What if I miss a dose? It is important not to miss your dose. Call your doctor or health care professional if you are unable to keep an appointment. What may interact with this medicine? Interactions have not been studied. Give your health care provider a list of all the medicines, herbs, non-prescription drugs, or dietary supplements you use. Also tell them if you smoke, drink alcohol, or use illegal drugs. Some items may interact with  your medicine. This list may not describe all possible interactions. Give your health care provider a list of all the medicines, herbs, non-prescription drugs, or dietary supplements you use. Also tell them if you smoke, drink alcohol, or use illegal drugs. Some items may interact with your medicine. What should I watch for while using this medicine? Your condition will be monitored carefully while you are receiving this medicine. You may need blood work done while you are taking this medicine. Do not become pregnant while taking this medicine or for 4 months after stopping it. Women should inform their doctor if they wish to become pregnant or think they might be pregnant. There is a potential for serious side effects to an unborn child. Talk to your health care professional  or pharmacist for more information. Do not breast-feed an infant while taking this medicine or for 4 months after the last dose. What side effects may I notice from receiving this medicine? Side effects that you should report to your doctor or health care professional as soon as possible:  allergic reactions like skin rash, itching or hives, swelling of the face, lips, or tongue  bloody or black, tarry  breathing problems  changes in vision  chest pain  chills  confusion  constipation  cough  diarrhea  dizziness or feeling faint or lightheaded  fast or irregular heartbeat  fever  flushing  hair loss  joint pain  low blood counts - this medicine may decrease the number of white blood cells, red blood cells and platelets. You may be at increased risk for infections and bleeding.  muscle pain  muscle weakness  persistent headache  redness, blistering, peeling or loosening of the skin, including inside the mouth  signs and symptoms of high blood sugar such as dizziness; dry mouth; dry skin; fruity breath; nausea; stomach pain; increased hunger or thirst; increased urination  signs and symptoms of  kidney injury like trouble passing urine or change in the amount of urine  signs and symptoms of liver injury like dark urine, light-colored stools, loss of appetite, nausea, right upper belly pain, yellowing of the eyes or skin  sweating  swollen lymph nodes  weight loss Side effects that usually do not require medical attention (report to your doctor or health care professional if they continue or are bothersome):  decreased appetite  muscle pain  tiredness This list may not describe all possible side effects. Call your doctor for medical advice about side effects. You may report side effects to FDA at 1-800-FDA-1088. Where should I keep my medicine? This drug is given in a hospital or clinic and will not be stored at home. NOTE: This sheet is a summary. It may not cover all possible information. If you have questions about this medicine, talk to your doctor, pharmacist, or health care provider.  2020 [employer]/Gold Standard (2018-02-07 13:46:58)

## 2018-10-25 ENCOUNTER — Telehealth: Payer: Self-pay | Admitting: *Deleted

## 2018-10-25 NOTE — Patient Instructions (Signed)
Pembrolizumab injection What is this medicine? PEMBROLIZUMAB (pem broe liz ue mab) is a monoclonal antibody. It is used to treat bladder cancer, cervical cancer, endometrial cancer, esophageal cancer, head and neck cancer, hepatocellular cancer, Hodgkin lymphoma, kidney cancer, lymphoma, melanoma, Merkel cell carcinoma, lung cancer, stomach cancer, urothelial cancer, and cancers that have a certain genetic condition. This medicine may be used for other purposes; ask your health care provider or pharmacist if you have questions. COMMON BRAND NAME(S): Keytruda What should I tell my health care provider before I take this medicine? They need to know if you have any of these conditions:  diabetes  immune system problems  inflammatory bowel disease  liver disease  lung or breathing disease  lupus  received or scheduled to receive an organ transplant or a stem-cell transplant that uses donor stem cells  an unusual or allergic reaction to pembrolizumab, other medicines, foods, dyes, or preservatives  pregnant or trying to get pregnant  breast-feeding How should I use this medicine? This medicine is for infusion into a vein. It is given by a health care professional in a hospital or clinic setting. A special MedGuide will be given to you before each treatment. Be sure to read this information carefully each time. Talk to your pediatrician regarding the use of this medicine in children. While this drug may be prescribed for selected conditions, precautions do apply. Overdosage: If you think you have taken too much of this medicine contact a poison control center or emergency room at once. NOTE: This medicine is only for you. Do not share this medicine with others. What if I miss a dose? It is important not to miss your dose. Call your doctor or health care professional if you are unable to keep an appointment. What may interact with this medicine? Interactions have not been studied. Give  your health care provider a list of all the medicines, herbs, non-prescription drugs, or dietary supplements you use. Also tell them if you smoke, drink alcohol, or use illegal drugs. Some items may interact with your medicine. This list may not describe all possible interactions. Give your health care provider a list of all the medicines, herbs, non-prescription drugs, or dietary supplements you use. Also tell them if you smoke, drink alcohol, or use illegal drugs. Some items may interact with your medicine. What should I watch for while using this medicine? Your condition will be monitored carefully while you are receiving this medicine. You may need blood work done while you are taking this medicine. Do not become pregnant while taking this medicine or for 4 months after stopping it. Women should inform their doctor if they wish to become pregnant or think they might be pregnant. There is a potential for serious side effects to an unborn child. Talk to your health care professional or pharmacist for more information. Do not breast-feed an infant while taking this medicine or for 4 months after the last dose. What side effects may I notice from receiving this medicine? Side effects that you should report to your doctor or health care professional as soon as possible:  allergic reactions like skin rash, itching or hives, swelling of the face, lips, or tongue  bloody or black, tarry  breathing problems  changes in vision  chest pain  chills  confusion  constipation  cough  diarrhea  dizziness or feeling faint or lightheaded  fast or irregular heartbeat  fever  flushing  hair loss  joint pain  low blood counts - this  medicine may decrease the number of white blood cells, red blood cells and platelets. You may be at increased risk for infections and bleeding.  muscle pain  muscle weakness  persistent headache  redness, blistering, peeling or loosening of the skin,  including inside the mouth  signs and symptoms of high blood sugar such as dizziness; dry mouth; dry skin; fruity breath; nausea; stomach pain; increased hunger or thirst; increased urination  signs and symptoms of kidney injury like trouble passing urine or change in the amount of urine  signs and symptoms of liver injury like dark urine, light-colored stools, loss of appetite, nausea, right upper belly pain, yellowing of the eyes or skin  sweating  swollen lymph nodes  weight loss Side effects that usually do not require medical attention (report to your doctor or health care professional if they continue or are bothersome):  decreased appetite  muscle pain  tiredness This list may not describe all possible side effects. Call your doctor for medical advice about side effects. You may report side effects to FDA at 1-800-FDA-1088. Where should I keep my medicine? This drug is given in a hospital or clinic and will not be stored at home. NOTE: This sheet is a summary. It may not cover all possible information. If you have questions about this medicine, talk to your doctor, pharmacist, or health care provider.  2020 [employer]/Gold Standard (2018-02-07 13:46:58) Pemetrexed injection What is this medicine? PEMETREXED (PEM e TREX ed) is a chemotherapy drug used to treat lung cancers like non-small cell lung cancer and mesothelioma. It may also be used to treat other cancers. This medicine may be used for other purposes; ask your health care provider or pharmacist if you have questions. COMMON BRAND NAME(S): Alimta What should I tell my health care provider before I take this medicine? They need to know if you have any of these conditions:  infection (especially a virus infection such as chickenpox, cold sores, or herpes)  kidney disease  low blood counts, like low white cell, platelet, or red cell counts  lung or breathing disease, like asthma  radiation therapy  an unusual or  allergic reaction to pemetrexed, other medicines, foods, dyes, or preservative  pregnant or trying to get pregnant  breast-feeding How should I use this medicine? This drug is given as an infusion into a vein. It is administered in a hospital or clinic by a specially trained health care professional. Talk to your pediatrician regarding the use of this medicine in children. Special care may be needed. Overdosage: If you think you have taken too much of this medicine contact a poison control center or emergency room at once. NOTE: This medicine is only for you. Do not share this medicine with others. What if I miss a dose? It is important not to miss your dose. Call your doctor or health care professional if you are unable to keep an appointment. What may interact with this medicine? This medicine may interact with the following medications:  Ibuprofen This list may not describe all possible interactions. Give your health care provider a list of all the medicines, herbs, non-prescription drugs, or dietary supplements you use. Also tell them if you smoke, drink alcohol, or use illegal drugs. Some items may interact with your medicine. What should I watch for while using this medicine? Visit your doctor for checks on your progress. This drug may make you feel generally unwell. This is not uncommon, as chemotherapy can affect healthy cells as well as  cancer cells. Report any side effects. Continue your course of treatment even though you feel ill unless your doctor tells you to stop. In some cases, you may be given additional medicines to help with side effects. Follow all directions for their use. Call your doctor or health care professional for advice if you get a fever, chills or sore throat, or other symptoms of a cold or flu. Do not treat yourself. This drug decreases your body's ability to fight infections. Try to avoid being around people who are sick. This medicine may increase your risk to  bruise or bleed. Call your doctor or health care professional if you notice any unusual bleeding. Be careful brushing and flossing your teeth or using a toothpick because you may get an infection or bleed more easily. If you have any dental work done, tell your dentist you are receiving this medicine. Avoid taking products that contain aspirin, acetaminophen, ibuprofen, naproxen, or ketoprofen unless instructed by your doctor. These medicines may hide a fever. Call your doctor or health care professional if you get diarrhea or mouth sores. Do not treat yourself. To protect your kidneys, drink water or other fluids as directed while you are taking this medicine. Do not become pregnant while taking this medicine or for 6 months after stopping it. Women should inform their doctor if they wish to become pregnant or think they might be pregnant. Men should not father a child while taking this medicine and for 3 months after stopping it. This may interfere with the ability to father a child. You should talk to your doctor or health care professional if you are concerned about your fertility. There is a potential for serious side effects to an unborn child. Talk to your health care professional or pharmacist for more information. Do not breast-feed an infant while taking this medicine or for 1 week after stopping it. What side effects may I notice from receiving this medicine? Side effects that you should report to your doctor or health care professional as soon as possible:  allergic reactions like skin rash, itching or hives, swelling of the face, lips, or tongue  breathing problems  redness, blistering, peeling or loosening of the skin, including inside the mouth  signs and symptoms of bleeding such as bloody or black, tarry stools; red or dark-brown urine; spitting up blood or brown material that looks like coffee grounds; red spots on the skin; unusual bruising or bleeding from the eye, gums, or  nose  signs and symptoms of infection like fever or chills; cough; sore throat; pain or trouble passing urine  signs and symptoms of kidney injury like trouble passing urine or change in the amount of urine  signs and symptoms of liver injury like dark yellow or brown urine; general ill feeling or flu-like symptoms; light-colored stools; loss of appetite; nausea; right upper belly pain; unusually weak or tired; yellowing of the eyes or skin Side effects that usually do not require medical attention (report to your doctor or health care professional if they continue or are bothersome):  constipation  mouth sores  nausea, vomiting  unusually weak or tired This list may not describe all possible side effects. Call your doctor for medical advice about side effects. You may report side effects to FDA at 1-800-FDA-1088. Where should I keep my medicine? This drug is given in a hospital or clinic and will not be stored at home. NOTE: This sheet is a summary. It may not cover all possible information. If  you have questions about this medicine, talk to your doctor, pharmacist, or health care provider.  2020 [employer]/Gold Standard (2017-03-02 16:11:33) Carboplatin injection What is this medicine? CARBOPLATIN (KAR boe pla tin) is a chemotherapy drug. It targets fast dividing cells, like cancer cells, and causes these cells to die. This medicine is used to treat ovarian cancer and many other cancers. This medicine may be used for other purposes; ask your health care provider or pharmacist if you have questions. COMMON BRAND NAME(S): Paraplatin What should I tell my health care provider before I take this medicine? They need to know if you have any of these conditions:  blood disorders  hearing problems  kidney disease  recent or ongoing radiation therapy  an unusual or allergic reaction to carboplatin, cisplatin, other chemotherapy, other medicines, foods, dyes, or preservatives  pregnant  or trying to get pregnant  breast-feeding How should I use this medicine? This drug is usually given as an infusion into a vein. It is administered in a hospital or clinic by a specially trained health care professional. Talk to your pediatrician regarding the use of this medicine in children. Special care may be needed. Overdosage: If you think you have taken too much of this medicine contact a poison control center or emergency room at once. NOTE: This medicine is only for you. Do not share this medicine with others. What if I miss a dose? It is important not to miss a dose. Call your doctor or health care professional if you are unable to keep an appointment. What may interact with this medicine?  medicines for seizures  medicines to increase blood counts like filgrastim, pegfilgrastim, sargramostim  some antibiotics like amikacin, gentamicin, neomycin, streptomycin, tobramycin  vaccines Talk to your doctor or health care professional before taking any of these medicines:  acetaminophen  aspirin  ibuprofen  ketoprofen  naproxen This list may not describe all possible interactions. Give your health care provider a list of all the medicines, herbs, non-prescription drugs, or dietary supplements you use. Also tell them if you smoke, drink alcohol, or use illegal drugs. Some items may interact with your medicine. What should I watch for while using this medicine? Your condition will be monitored carefully while you are receiving this medicine. You will need important blood work done while you are taking this medicine. This drug may make you feel generally unwell. This is not uncommon, as chemotherapy can affect healthy cells as well as cancer cells. Report any side effects. Continue your course of treatment even though you feel ill unless your doctor tells you to stop. In some cases, you may be given additional medicines to help with side effects. Follow all directions for their  use. Call your doctor or health care professional for advice if you get a fever, chills or sore throat, or other symptoms of a cold or flu. Do not treat yourself. This drug decreases your body's ability to fight infections. Try to avoid being around people who are sick. This medicine may increase your risk to bruise or bleed. Call your doctor or health care professional if you notice any unusual bleeding. Be careful brushing and flossing your teeth or using a toothpick because you may get an infection or bleed more easily. If you have any dental work done, tell your dentist you are receiving this medicine. Avoid taking products that contain aspirin, acetaminophen, ibuprofen, naproxen, or ketoprofen unless instructed by your doctor. These medicines may hide a fever. Do not become pregnant while taking  this medicine. Women should inform their doctor if they wish to become pregnant or think they might be pregnant. There is a potential for serious side effects to an unborn child. Talk to your health care professional or pharmacist for more information. Do not breast-feed an infant while taking this medicine. What side effects may I notice from receiving this medicine? Side effects that you should report to your doctor or health care professional as soon as possible:  allergic reactions like skin rash, itching or hives, swelling of the face, lips, or tongue  signs of infection - fever or chills, cough, sore throat, pain or difficulty passing urine  signs of decreased platelets or bleeding - bruising, pinpoint red spots on the skin, black, tarry stools, nosebleeds  signs of decreased red blood cells - unusually weak or tired, fainting spells, lightheadedness  breathing problems  changes in hearing  changes in vision  chest pain  high blood pressure  low blood counts - This drug may decrease the number of white blood cells, red blood cells and platelets. You may be at increased risk for  infections and bleeding.  nausea and vomiting  pain, swelling, redness or irritation at the injection site  pain, tingling, numbness in the hands or feet  problems with balance, talking, walking  trouble passing urine or change in the amount of urine Side effects that usually do not require medical attention (report to your doctor or health care professional if they continue or are bothersome):  hair loss  loss of appetite  metallic taste in the mouth or changes in taste This list may not describe all possible side effects. Call your doctor for medical advice about side effects. You may report side effects to FDA at 1-800-FDA-1088. Where should I keep my medicine? This drug is given in a hospital or clinic and will not be stored at home. NOTE: This sheet is a summary. It may not cover all possible information. If you have questions about this medicine, talk to your doctor, pharmacist, or health care provider.  2020 Elsevier/Gold Standard (2007-04-18 14:38:05)

## 2018-10-25 NOTE — Telephone Encounter (Signed)
Nurse also informed wife that a nurse would call a few days prior to procedure to go over specific instructions.  Wife verbalized understanding.

## 2018-10-26 ENCOUNTER — Inpatient Hospital Stay: Payer: Medicare HMO | Admitting: Nurse Practitioner

## 2018-10-26 ENCOUNTER — Other Ambulatory Visit: Payer: Self-pay

## 2018-10-26 ENCOUNTER — Inpatient Hospital Stay: Payer: Medicare HMO | Attending: Hematology and Oncology

## 2018-10-27 ENCOUNTER — Ambulatory Visit
Admission: RE | Admit: 2018-10-27 | Discharge: 2018-10-27 | Disposition: A | Discharge status: Home / Self Care | Payer: Medicare HMO | Source: Ambulatory Visit | Attending: Nurse Practitioner | Admitting: Nurse Practitioner

## 2018-10-27 ENCOUNTER — Other Ambulatory Visit: Payer: Self-pay

## 2018-10-27 ENCOUNTER — Inpatient Hospital Stay (HOSPITAL_BASED_OUTPATIENT_CLINIC_OR_DEPARTMENT_OTHER): Payer: Medicare HMO | Admitting: Nurse Practitioner

## 2018-10-27 DIAGNOSIS — C3431 Malignant neoplasm of lower lobe, right bronchus or lung: Secondary | ICD-10-CM | POA: Insufficient documentation

## 2018-10-27 DIAGNOSIS — J90 Pleural effusion, not elsewhere classified: Secondary | ICD-10-CM | POA: Diagnosis not present

## 2018-10-27 DIAGNOSIS — C7951 Secondary malignant neoplasm of bone: Secondary | ICD-10-CM

## 2018-10-27 DIAGNOSIS — G893 Neoplasm related pain (acute) (chronic): Secondary | ICD-10-CM

## 2018-10-27 DIAGNOSIS — J910 Malignant pleural effusion: Secondary | ICD-10-CM

## 2018-10-27 DIAGNOSIS — K5900 Constipation, unspecified: Secondary | ICD-10-CM

## 2018-10-27 NOTE — Progress Notes (Signed)
START OFF PATHWAY REGIMEN - Non-Small Cell Lung   [id number]:Pembrolizumab 200 mg  IV D1 + Pemetrexed 500 mg/m2 IV D1 + Carboplatin AUC=5 IV D1 q21 Days:   A cycle is every 21 days:     Pembrolizumab      Pemetrexed      Carboplatin   **Always confirm dose/schedule in your pharmacy ordering system**  Patient Characteristics: Stage IV Metastatic, Nonsquamous, Initial Chemotherapy/Immunotherapy, PS = 2, ALK or EGFR or ROS1 or NTRK or MET or RET Genomic Alterations - Did Not Order Test/Quantity Not Sufficient AJCC T Category: T4 Current Disease Status: Distant Metastases AJCC N Category: N1 AJCC M Category: M1 AJCC 8 Stage Grouping: IV Histology: Nonsquamous Cell ROS1 Rearrangement Status: Quantity Not Sufficient [id number] Mutation Status: Not Applicable - EGFR Mutation Negative/Unknown Other Mutations/Biomarkers: No Other Actionable Mutations Chemotherapy/Immunotherapy LOT: Initial Chemotherapy/Immunotherapy Molecular Targeted Therapy: Not Appropriate MET Exon 14 Mutation Status: Quantity Not Sufficient RET Gene Fusion Status: Quantity Not Sufficient EGFR Mutation Status: Quantity Not Sufficient NTRK Gene Fusion Status: Quantity Not Sufficient PD-L1 Expression Status: Quantity Not Sufficient ALK Rearrangement Status: Quantity Not Sufficient BRAF V600E Mutation Status: Quantity Not Sufficient ECOG Performance Status: 2 Intent of Therapy: Non-Curative / Palliative Intent, Discussed with Patient

## 2018-10-27 NOTE — Progress Notes (Signed)
[facility]  [phone] [phone]  Patient Care Team: [name], MD as PCP - General (Family Medicine) Lequita Asal, MD as Referring Physician (Hematology and Oncology)   Name of the patient: Andres Hebert  [id number]  1931/08/25   Date of visit: 10/27/18  Diagnosis-stage IV adenocarcinoma of the right lung  Chief complaint/Reason for visit- Initial Meeting for [facility], preparing for starting chemotherapy  Heme/Onc history:  Andres Hebert, initially presented as 85 year old male newly diagnosed with lung cancer.  PET scan on 10/18/2018 revealed a large hypermetabolic right lower lobe lung mass consistent with bronchogenic carcinoma, multifocal pleural metastasis within the right hemithorax, one pleural metastatic lesion invading adjacent right seventh rib and moderate right effusion, probable right hilar metastatic adenopathy, no mediastinal or supraclavicular metastatic adenopathy. Managed by Dr. Mike Gip.  Plan to proceed with cycle 1 of carbo-Alimta-pembrolizumab  Oncology History   No history exists.    Interval history-  Andres Hebert, 85 year old male with above history of stage IV adenocarcinoma of the right lung, who presents to chemo care clinic today for initial meeting in preparation for starting chemotherapy. I introduced the chemo care clinic and we discussed that the role of the clinic is to assist those who are at an increased risk of emergency room visits and/or complications during the course of chemotherapy treatment. We discussed that the increased risk takes into account factors such as age, performance status, and co-morbidities. We also discussed that for some, this might include barriers to care such as not having a primary care provider, lack of insurance/transportation, or not being able to afford medications. We discussed that the goal of the program is to help  prevent unplanned ER visits and help reduce complications during chemotherapy. We do this by discussing specific risk factors to each individual and identifying ways that we can help improve these risk factors and reduce barriers to care.  We discussed that he was identified as high risk of ER/hospitalization based on prior ER utilization and prior hospitalizations, Medicare status, COPD, depression, diabetes mellitus.  ECOG FS:3 - Symptomatic, >50% confined to bed  Review of systems- Review of Systems  Constitutional: Positive for malaise/fatigue. Negative for chills, fever and weight loss.  Respiratory: Positive for shortness of breath (With exertion). Negative for cough.   Cardiovascular: Negative for chest pain and palpitations.  Gastrointestinal: Positive for constipation. Negative for abdominal pain and diarrhea.  Genitourinary: Negative for dysuria and hematuria.  Musculoskeletal: Positive for back pain and myalgias. Negative for falls.  Neurological: Positive for weakness. Negative for dizziness and headaches.  Endo/Heme/Allergies: Negative for environmental allergies. Does not bruise/bleed easily.  Psychiatric/Behavioral: Positive for depression. Negative for suicidal ideas. The patient has insomnia (Due to pain). The patient is not nervous/anxious.      Allergies  Allergen Reactions   Accupril [Quinapril Hcl] Other (See Comments)    Reaction:dizziness per MR    Past Medical History:  Diagnosis Date   BPV (benign positional vertigo)    Chronic constipation    Diabetes mellitus without complication ([place])    Hyperlipidemia    Hypertension     No past surgical history on file.  Social History   Socioeconomic History   Marital status: Married    Spouse name: Not on file   Number of children: Not on file   Years of education: Not on file   Highest education level: Not on file  Occupational History   Not on file  Social Designer, fashion/clothing strain:  Not on file   Food insecurity    Worry: Not on file    Inability: Not on file   Transportation needs    Medical: Not on file    Non-medical: Not on file  Tobacco Use   Smoking status: Former Smoker    Packs/day: 1.00    Years: 25.00    Pack years: 25.00    Types: Cigarettes    Quit date: 47    Years since quitting: 44.7   Smokeless tobacco: Never Used  Substance and Sexual Activity   Alcohol use: Not Currently   Drug use: Never   Sexual activity: Not on file  Lifestyle   Physical activity    Days per week: Not on file    Minutes per session: Not on file   Stress: Not on file  Relationships   Social connections    Talks on phone: Not on file    Gets together: Not on file    Attends religious service: Not on file    Active member of club or organization: Not on file    Attends meetings of clubs or organizations: Not on file    Relationship status: Not on file   Intimate partner violence    Fear of current or ex partner: Not on file    Emotionally abused: Not on file    Physically abused: Not on file    Forced sexual activity: Not on file  Other Topics Concern   Not on file  Social History Narrative   Not on file    Family History  Problem Relation Age of Onset   Stroke Mother    Prostate cancer Father      Current Outpatient Medications:    amLODipine (NORVASC) 5 MG tablet, Take 5 mg by mouth daily., Disp: , Rfl:    citalopram (CELEXA) 10 MG tablet, Take 1 tablet by mouth daily., Disp: , Rfl:    feeding supplement, ENSURE ENLIVE, (ENSURE ENLIVE) LIQD, Take 237 mLs by mouth 3 (three) times daily between meals., Disp: 970 mL, Rfl: 0   folic acid (FOLVITE) 1 MG tablet, Take 1 tablet (1 mg total) by mouth daily., Disp: 30 tablet, Rfl: 1   oxyCODONE-acetaminophen (PERCOCET/ROXICET) 5-325 MG tablet, Take 1 tablet by mouth every 4 (four) hours as needed for moderate pain., Disp: 30 tablet, Rfl: 0   potassium chloride (K-DUR,KLOR-CON) 10 MEQ  tablet, Take 10 mEq by mouth 2 (two) times daily., Disp: , Rfl:    pravastatin (PRAVACHOL) 40 MG tablet, Take 40 mg by mouth daily., Disp: , Rfl:    senna (SENOKOT) 8.6 MG TABS tablet, Take 1 tablet (8.6 mg total) by mouth daily as needed for mild constipation., Disp: 30 tablet, Rfl: 0   triamterene-hydrochlorothiazide (MAXZIDE-25) 37.5-25 MG tablet, Take 1 tablet by mouth daily., Disp: , Rfl:   Physical exam: There were no vitals filed for this visit. Physical Exam Constitutional:      General: He is not in acute distress.    Comments: Accompanied by wife.  Wearing mask.  HENT:     Head: Normocephalic.  Eyes:     General: No scleral icterus.    Conjunctiva/sclera: Conjunctivae normal.  Cardiovascular:     Rate and Rhythm: Normal rate and regular rhythm.  Pulmonary:     Effort: No respiratory distress.     Breath sounds: No wheezing.  Skin:    General: Skin is warm and dry.  Neurological:  Mental Status: He is alert and oriented to person, place, and time.     Comments: Per wife, confusion at times  Psychiatric:        Mood and Affect: Affect is flat.        Behavior: Behavior is cooperative.      CMP Latest Ref Rng & Units 10/08/2018  Glucose 70 - 99 mg/dL 157(H)  BUN 8 - 23 mg/dL 34(H)  Creatinine 0.61 - 1.24 mg/dL 0.93  Sodium 135 - 145 mmol/L 138  Potassium 3.5 - 5.1 mmol/L 4.1  Chloride 98 - 111 mmol/L 99  CO2 22 - 32 mmol/L 31  Calcium 8.9 - 10.3 mg/dL 9.3  Total Protein 6.5 - 8.1 g/dL -  Total Bilirubin 0.3 - 1.2 mg/dL -  Alkaline Phos 38 - 126 U/L -  AST 15 - 41 U/L -  ALT 0 - 44 U/L -   CBC Latest Ref Rng & Units 10/08/2018  WBC 4.0 - 10.5 K/uL 7.5  Hemoglobin 13.0 - 17.0 g/dL 13.2  Hematocrit 39.0 - 52.0 % 39.6  Platelets 150 - 400 K/uL 165    No images are attached to the encounter.  Ct Angio Head W Or Wo Contrast  Result Date: 10/08/2018 CLINICAL DATA:  Lung cancer. Balance disturbance. Abnormal flow left vertebral artery seen by MRI. EXAM: CT  ANGIOGRAPHY HEAD AND NECK TECHNIQUE: Multidetector CT imaging of the head and neck was performed using the standard protocol during bolus administration of intravenous contrast. Multiplanar CT image reconstructions and MIPs were obtained to evaluate the vascular anatomy. Carotid stenosis measurements (when applicable) are obtained utilizing NASCET criteria, using the distal internal carotid diameter as the denominator. CONTRAST:  71mL OMNIPAQUE IOHEXOL 350 MG/ML SOLN COMPARISON:  MRI 10/07/2018 FINDINGS: CT HEAD FINDINGS Brain: Mild age related volume loss. Mild chronic appearing small vessel change of the hemispheric white matter. No sign of acute infarction, mass lesion, hemorrhage, hydrocephalus or extra-axial collection. Vascular: Negative noncontrast appearance. Skull: Normal Sinuses: Clear Orbits: Normal Review of the MIP images confirms the above findings CTA NECK FINDINGS Aortic arch: Aortic atherosclerosis. No aneurysm or dissection. Branching pattern shows the congenital variation of a small left vertebral artery arising from the arch. Right carotid system: Common carotid artery widely patent to the bifurcation. Ordinary atherosclerotic plaque at the carotid bifurcation and ICA bulb but no stenosis. Cervical ICA widely patent. Left carotid system: Common carotid artery widely patent to the bifurcation. Ordinary calcified plaque at the carotid bifurcation and ICA bulb but no stenosis. Cervical ICA widely patent. Vertebral arteries: Dominant left vertebral artery is widely patent at its origin and through the cervical region to the foramen magnum. Non dominant small left vertebral artery arises directly from the arch and is patent through the cervical region to the foramen magnum. Skeleton: Ordinary cervical spondylosis. Other neck: No mass or lymphadenopathy. Upper chest: See results recent chest evaluations. Scarring and emphysema at the lung apices. Layering pleural fluid on the right. Review of the MIP  images confirms the above findings CTA HEAD FINDINGS Anterior circulation: Both internal carotid arteries are patent through the skull base and siphon regions. No siphon stenosis. Anterior and middle cerebral vessels are patent without proximal stenosis, aneurysm or vascular malformation. Posterior circulation: Dominant right vertebral artery is widely patent through the foramen magnum to the basilar. Congenital small left vertebral artery is patent through the foramen magnum, supplying left PICA, with a very rudimentary connection to the basilar. No basilar stenosis. Superior cerebellar and posterior cerebral arteries  show flow. No stenosis or aneurysm. Venous sinuses: Patent and normal. Anatomic variants: None other significant. Review of the MIP images confirms the above findings IMPRESSION: Ordinary aortic atherosclerosis. Ordinary atherosclerosis at the carotid bifurcations without stenosis. No intracranial anterior circulation finding of significance. Patient has the congenital variation of asymmetric vertebral anatomy. Dominant normal appearing right vertebral artery widely patent to the basilar. Tiny left vertebral artery arising directly from the arch, but patent through the neck, supplying left PICA and with a rudimentary contribution to the basilar. This is not likely to be of any clinical relevance. Electronically Signed   By: Nelson Chimes M.D.   On: 10/08/2018 10:22   Dg Chest 2 View  Result Date: 10/24/2018 CLINICAL DATA:  Assess pleural fluid EXAM: CHEST - 2 VIEW COMPARISON:  10/06/2018, CT chest, 10/05/2018 FINDINGS: Interval increase in a right-sided pleural effusion, now moderate, with associated atelectasis or consolidation and multiple pleural based nodules, better characterized by prior CT. Emphysema. The heart mediastinum are unremarkable. Disc degenerative disease of the thoracic spine IMPRESSION: 1. Interval increase in a right-sided pleural effusion, now moderate, with associated  atelectasis or consolidation and multiple pleural based nodules, better characterized by prior CT. 2.  Emphysema. Electronically Signed   By: Eddie Candle M.D.   On: 10/24/2018 10:53   Dg Chest 2 View  Result Date: 10/05/2018 CLINICAL DATA:  Shortness of breath EXAM: CHEST - 2 VIEW COMPARISON:  None. FINDINGS: Small right pleural effusion with lateral loculated component versus pleural nodularity. The lungs are hyperexpanded with increased interstitial markings. Heart size is normal. There is calcific aortic atherosclerosis. IMPRESSION: Small right pleural effusion with lateral loculated component versus pleural nodularity. In the setting of recent trauma, this may indicate nondisplaced or minimally displaced rib fractures with a small hemothorax. CT may be helpful for further investigation. Electronically Signed   By: Ulyses Jarred M.D.   On: 10/05/2018 19:10   Ct Angio Neck W Or Wo Contrast  Result Date: 10/08/2018 CLINICAL DATA:  Lung cancer. Balance disturbance. Abnormal flow left vertebral artery seen by MRI. EXAM: CT ANGIOGRAPHY HEAD AND NECK TECHNIQUE: Multidetector CT imaging of the head and neck was performed using the standard protocol during bolus administration of intravenous contrast. Multiplanar CT image reconstructions and MIPs were obtained to evaluate the vascular anatomy. Carotid stenosis measurements (when applicable) are obtained utilizing NASCET criteria, using the distal internal carotid diameter as the denominator. CONTRAST:  38mL OMNIPAQUE IOHEXOL 350 MG/ML SOLN COMPARISON:  MRI 10/07/2018 FINDINGS: CT HEAD FINDINGS Brain: Mild age related volume loss. Mild chronic appearing small vessel change of the hemispheric white matter. No sign of acute infarction, mass lesion, hemorrhage, hydrocephalus or extra-axial collection. Vascular: Negative noncontrast appearance. Skull: Normal Sinuses: Clear Orbits: Normal Review of the MIP images confirms the above findings CTA NECK FINDINGS Aortic  arch: Aortic atherosclerosis. No aneurysm or dissection. Branching pattern shows the congenital variation of a small left vertebral artery arising from the arch. Right carotid system: Common carotid artery widely patent to the bifurcation. Ordinary atherosclerotic plaque at the carotid bifurcation and ICA bulb but no stenosis. Cervical ICA widely patent. Left carotid system: Common carotid artery widely patent to the bifurcation. Ordinary calcified plaque at the carotid bifurcation and ICA bulb but no stenosis. Cervical ICA widely patent. Vertebral arteries: Dominant left vertebral artery is widely patent at its origin and through the cervical region to the foramen magnum. Non dominant small left vertebral artery arises directly from the arch and is patent through the cervical  region to the foramen magnum. Skeleton: Ordinary cervical spondylosis. Other neck: No mass or lymphadenopathy. Upper chest: See results recent chest evaluations. Scarring and emphysema at the lung apices. Layering pleural fluid on the right. Review of the MIP images confirms the above findings CTA HEAD FINDINGS Anterior circulation: Both internal carotid arteries are patent through the skull base and siphon regions. No siphon stenosis. Anterior and middle cerebral vessels are patent without proximal stenosis, aneurysm or vascular malformation. Posterior circulation: Dominant right vertebral artery is widely patent through the foramen magnum to the basilar. Congenital small left vertebral artery is patent through the foramen magnum, supplying left PICA, with a very rudimentary connection to the basilar. No basilar stenosis. Superior cerebellar and posterior cerebral arteries show flow. No stenosis or aneurysm. Venous sinuses: Patent and normal. Anatomic variants: None other significant. Review of the MIP images confirms the above findings IMPRESSION: Ordinary aortic atherosclerosis. Ordinary atherosclerosis at the carotid bifurcations without  stenosis. No intracranial anterior circulation finding of significance. Patient has the congenital variation of asymmetric vertebral anatomy. Dominant normal appearing right vertebral artery widely patent to the basilar. Tiny left vertebral artery arising directly from the arch, but patent through the neck, supplying left PICA and with a rudimentary contribution to the basilar. This is not likely to be of any clinical relevance. Electronically Signed   By: Nelson Chimes M.D.   On: 10/08/2018 10:22   Ct Angio Chest Pe W And/or Wo Contrast  Result Date: 10/05/2018 CLINICAL DATA:  Chest pain EXAM: CT ANGIOGRAPHY CHEST WITH CONTRAST TECHNIQUE: Multidetector CT imaging of the chest was performed using the standard protocol during bolus administration of intravenous contrast. Multiplanar CT image reconstructions and MIPs were obtained to evaluate the vascular anatomy. CONTRAST:  54mL OMNIPAQUE IOHEXOL 350 MG/ML SOLN COMPARISON:  Chest x-ray today FINDINGS: Cardiovascular: No filling defects in the pulmonary arteries to suggest pulmonary emboli. Heart is normal size. Aorta is normal caliber. Coronary artery and aortic atherosclerosis. Mediastinum/Nodes: Enlarged right hilar lymph nodes with index node having a short axis diameter of 10 mm. No mediastinal, left hilar or axillary adenopathy. Lungs/Pleura: There is concern for right lower lobe mass measuring approximately 8.4 x 6.9 cm. Associated large right joint effusion with pleural nodules concerning for metastatic pleural involvement. Compressive atelectasis in the right lower lobe. No focal opacity on the left. Moderate to advanced emphysema. Upper Abdomen: Imaging into the upper abdomen shows no acute findings. Musculoskeletal: Destructive mass noted in the lateral right 7th rib. Bone destruction also in the lateral right 8th rib. Diffuse degenerative changes in the thoracic spine. Review of the MIP images confirms the above findings. IMPRESSION: Large right lower  lobe mass measures up to 8.4 cm most compatible with lung cancer. This is associated with numerous pleural nodules, large right pleural effusion compatible with pleural metastatic disease. There also bony destructive lytic lesions in the right lateral 7th and 8th ribs. Moderate to advanced emphysema. No evidence of pulmonary embolus. Aortic Atherosclerosis (ICD10-I70.0) and Emphysema (ICD10-J43.9). Electronically Signed   By: Rolm Baptise M.D.   On: 10/05/2018 21:10   Mr Jeri Cos VV Contrast  Result Date: 10/07/2018 CLINICAL DATA:  85 year old male with large right lung mass. Loss of balance over the past 3-4 months. Staging. EXAM: MRI HEAD WITHOUT AND WITH CONTRAST TECHNIQUE: Multiplanar, multiecho pulse sequences of the brain and surrounding structures were obtained without and with intravenous contrast. CONTRAST:  5mL GADAVIST GADOBUTROL 1 MMOL/ML IV SOLN COMPARISON:  None. FINDINGS: Brain: Mild motion artifact on  post-contrast images. No abnormal enhancement identified. No midline shift, mass effect, or evidence of intracranial mass lesion. Cerebral volume is within normal limits for age. No restricted diffusion to suggest acute infarction. No ventriculomegaly, extra-axial collection or acute intracranial hemorrhage. Cervicomedullary junction and pituitary are within normal limits. Minimal to mild for age scattered nonspecific cerebral white matter T2 and FLAIR hyperintensity. No cortical encephalomalacia or chronic cerebral blood products. There is a possible small chronic subcortical white matter lacune in the left anterior frontal lobe on series 10, image 15. Vascular: Loss of the distal left vertebral artery flow void at the skull base although there does appear to be some left V4 segment collateral flow. Other Major intracranial vascular flow voids are preserved. The major dural venous sinuses are enhancing and appear to be patent. Skull and upper cervical spine: Negative visible cervical spine and  spinal cord. Visible bone marrow signal is normal. Sinuses/Orbits: Postoperative changes to the globes, otherwise negative orbits. Paranasal sinuses are clear. Other: Mastoids are clear. Visible internal auditory structures appear normal. Scalp and face soft tissues appear negative. IMPRESSION: 1. No metastatic disease or acute intracranial abnormality identified. 2. Distal left vertebral artery with poor flow or occlusion, most likely due to atherosclerosis. CTA or MRA of the neck and head could evaluate further as necessary. Electronically Signed   By: Genevie Ann M.D.   On: 10/07/2018 16:50   Nm Pet Image Initial (pi) Skull Base To Thigh  Result Date: 10/18/2018 CLINICAL DATA:  Initial treatment strategy for lung adenocarcinoma. RIGHT lower lobe mass. EXAM: NUCLEAR MEDICINE PET SKULL BASE TO THIGH TECHNIQUE: 8.0 mCi F-18 FDG was injected intravenously. Full-ring PET imaging was performed from the skull base to thigh after the radiotracer. CT data was obtained and used for attenuation correction and anatomic localization. Fasting blood glucose: 150 mg/dl COMPARISON:  Chest CT October 05, 2018 FINDINGS: Mediastinal blood pool activity: SUV max 2.64 Liver activity: SUV max NA NECK: No hypermetabolic cervical lymph nodes. Incidental CT findings: none Chest: Hypermetabolic mass in the RIGHT lower lobe measures 7.5 x 5.7 cm with intense metabolic activity (SUV max equal 17.8). There is hypermetabolic pleural thickening in the RIGHT upper lobe and RIGHT lower lobe. For example, RIGHT upper lobe pleural nodule measuring 2.9 x 1.4 cm witth SUV max equal 7.7. There is a moderate RIGHT pleural effusion. Within the fusion is a hypermetabolic pleural nodule SUV max equal 4.9. Approximately 12 hypermetabolic pleural nodules in the RIGHT lung. One of these pleural nodules invades the cortex of the adjacent 7th rib (image 218 of the fused data set). Hypermetabolic RIGHT hilar lymph node. No central hypermetabolic mediastinal  lymph nodes. No hypermetabolic supraclavicular adenopathy. Incidental CT findings: none ABDOMEN/PELVIS: Adjacent to the RIGHT adrenal gland is hypermetabolic nodular thickening along the diaphragm. Adrenal glands are normal. No abnormal activity within the liver. No hypermetabolic upper abdominal lymph nodes. No hypermetabolic pelvic lymph nodes. Incidental CT findings: Prostate enlarged. Atherosclerotic calcification of the aorta. SKELETON: Cortical invasion of the RIGHT seventh rib as described in the chest section no additional evidence skeletal metastasis Incidental CT findings: none IMPRESSION: 1. Large hypermetabolic RIGHT lower lobe mass consistent bronchogenic carcinoma. 2. Multifocal pleural metastasis within the RIGHT hemithorax. 3. One pleural metastatic lesion invades adjacent RIGHT seventh rib. 4. Moderate RIGHT effusion. 5. Probable RIGHT hilar metastatic adenopathy. No mediastinal or supraclavicular metastatic adenopathy. Electronically Signed   By: Suzy Bouchard M.D.   On: 10/18/2018 12:28   Dg Chest Port 1 View  Result Date:  10/06/2018 CLINICAL DATA:  Post right-sided thoracentesis EXAM: PORTABLE CHEST 1 VIEW COMPARISON:  Chest radiograph-10/05/2018; chest CT-10/05/2018 FINDINGS: Grossly unchanged cardiac silhouette and mediastinal contours with atherosclerotic plaque within the thoracic aorta. Interval reduction/near resolution of right-sided pleural effusion post thoracentesis. No pneumothorax. Improved aeration of the right lung base with persistent masslike consolidative opacities and partial atelectasis/collapse. There is persistent pleuroparenchymal thickening about the peripheral aspect of the right mid lung. The left hemithorax remains well aerated. Similar findings of lung hyperexpansion. No evidence of edema. No acute osseous abnormalities. Degenerative change the right glenohumeral joint. IMPRESSION: 1. Interval reduction/near resolution of right-sided pleural effusion post  thoracentesis. No pneumothorax. 2. Improved aeration of the right lower lung with persistent masslike consolidative opacities and partial atelectasis/collapse. 3. Grossly unchanged pleuroparenchymal thickening along the right lateral chest wall compatible with suspected pleural metastasis demonstrated on preceding chest CT. 4. Aortic Atherosclerosis (ICD10-I70.0) and Emphysema (ICD10-J43.9). Electronically Signed   By: Sandi Mariscal M.D.   On: 10/06/2018 10:04   US Thoracentesis Asp Pleural Space W/img Guide  Result Date: 10/06/2018 INDICATION: No known primary, now with concern for metastatic lung cancer with symptomatic right-sided pleural effusion. Please perform ultrasound-guided thoracentesis for diagnostic and therapeutic purposes. EXAM: US THORACENTESIS ASP PLEURAL SPACE W/IMG GUIDE COMPARISON:  Chest radiograph-10/05/2018; chest CT-10/05/2018 MEDICATIONS: None. COMPLICATIONS: None immediate. TECHNIQUE: Informed written consent was obtained from the patient after a discussion of the risks, benefits and alternatives to treatment. A timeout was performed prior to the initiation of the procedure. Initial ultrasound scanning demonstrates a moderate sized right-sided anechoic pleural effusion. The lower chest was prepped and draped in the usual sterile fashion. 1% lidocaine was used for local anesthesia. An ultrasound image was saved for documentation purposes. An 8 Fr Safe-T-Centesis catheter was introduced. The thoracentesis was performed. The catheter was removed and a dressing was applied. The patient tolerated the procedure well without immediate post procedural complication. The patient was escorted to have an upright chest radiograph. FINDINGS: A total of approximately 1.2 liters of serous fluid was removed. Requested samples were sent to the laboratory. IMPRESSION: Successful ultrasound-guided right sided thoracentesis yielding 1.2 liters of pleural fluid. Electronically Signed   By: Sandi Mariscal M.D.    On: 10/06/2018 12:19     Assessment and plan- Patient is a 85 y.o. male who presents to [facility] for initial meeting in preparation for starting chemotherapy for the treatment of lung cancer.   1. Stage IV adenocarcinoma of the lung-per Dr. Mike Gip, plan for carbo-pemetrexed-pembrolizumab.  Treatment given with a palliative/noncurative intent.  2. Chemo Care Clinic/High Risk for ER/Hospitalization during chemotherapy- We discussed the role of the chemo care clinic and identified patient specific risk factors. I discussed that patient was identified as high risk primarily based on: Prior hospitalization and ER utilization Medicare status, COPD, history of depression, and history of diabetes.  3. Social Determinants of Health- we discussed that social determinants of health may have significant impacts on health and outcomes for cancer patients.  Today we discussed specific social determinants of performance status, alcohol use, depression, financial needs, food insecurity, housing, interpersonal violence, social connections, stress, tobacco use, and transportation.  After lengthy discussion patient and his wife declined specific interventions but we had discussion of variety of programs available through the cancer center including care program, outpatient rehabilitative services, nutritionist, counseling services, psychiatry, palliative care, symptom management, financial assistance, food pantry, social work, support groups, and transportation services.  4. Palliative Care- based on stage of cancer and/or identified  needs today, we discussed referral to palliative care for ongoing symptom management and management of pain secondary to metastatic disease.  He is accepting of referral.  Given ER referral, patient may need to be seen in hospital.  5.  Shortness of breath-worse. patient says that he feels like he has fluid on his chest again. He says this is happened previously and fluid had to  be drawn off.  Chest x-ray ordered today shows reaccumulation of fluid.  Discussed with Dr. Tasia Catchings who recommends referral to ER for evaluation and consideration of palliative thoracentesis.    6.  Constipation-decreased frequency of bowel movements.  Likely secondary to poor oral intake, low fiber diet, and poor fluid hydration.  Complicated by pain medication.  Bowel regimen discussed in detail today.  We discussed using MiraLAX and senna one to two times a day with goal of 1 bowel movement every day to every other day without pain or straining.  7.  Pain secondary to metastatic disease-continue pain medication as prescribed.  Alternate with Tylenol not to exceed 3000 mg in 24-hour period.   We also discussed the role of the Symptom Management Clinic at [facility] for acute issues and methods of contacting clinic/provider. He denies needing specific assistance at this time and He will be followed by Telford Nab, RN (Nurse Navigator).   Disposition:  Chest x-ray ER for evaluation & possible thoracentesis Referral to palliative care Follow up with Dr. Mike Gip as scheduled  Visit Diagnosis 1. Primary adenocarcinoma of lower lobe of right lung ([place])   2. Malignant pleural effusion   3. Pain due to malignant neoplasm metastatic to bone (H[facility])   4. Constipation, unspecified constipation type    Patient expressed understanding and was in agreement with this plan. He also understands that He can call clinic at any time with any questions, concerns, or complaints.   A total of (45) minutes of face-to-face time was spent with this patient with greater than 50% of that time in counseling and care-coordination.  Beckey Rutter, DNP, AGNP-C [facility]

## 2018-10-28 ENCOUNTER — Other Ambulatory Visit: Payer: Self-pay

## 2018-10-28 ENCOUNTER — Inpatient Hospital Stay
Admission: EM | Admit: 2018-10-28 | Discharge: 2018-10-31 | DRG: 180 | Disposition: A | Discharge status: Home Health | Payer: Medicare HMO | Attending: Internal Medicine | Admitting: Internal Medicine

## 2018-10-28 ENCOUNTER — Encounter: Payer: Self-pay | Admitting: Emergency Medicine

## 2018-10-28 DIAGNOSIS — Z6825 Body mass index (BMI) 25.0-25.9, adult: Secondary | ICD-10-CM

## 2018-10-28 DIAGNOSIS — J209 Acute bronchitis, unspecified: Secondary | ICD-10-CM | POA: Diagnosis present

## 2018-10-28 DIAGNOSIS — J9601 Acute respiratory failure with hypoxia: Secondary | ICD-10-CM | POA: Diagnosis present

## 2018-10-28 DIAGNOSIS — R627 Adult failure to thrive: Secondary | ICD-10-CM | POA: Diagnosis present

## 2018-10-28 DIAGNOSIS — E785 Hyperlipidemia, unspecified: Secondary | ICD-10-CM | POA: Diagnosis present

## 2018-10-28 DIAGNOSIS — Z87891 Personal history of nicotine dependence: Secondary | ICD-10-CM | POA: Diagnosis not present

## 2018-10-28 DIAGNOSIS — Z515 Encounter for palliative care: Secondary | ICD-10-CM

## 2018-10-28 DIAGNOSIS — R042 Hemoptysis: Secondary | ICD-10-CM | POA: Diagnosis present

## 2018-10-28 DIAGNOSIS — R531 Weakness: Secondary | ICD-10-CM | POA: Diagnosis present

## 2018-10-28 DIAGNOSIS — C3431 Malignant neoplasm of lower lobe, right bronchus or lung: Principal | ICD-10-CM | POA: Diagnosis present

## 2018-10-28 DIAGNOSIS — C801 Malignant (primary) neoplasm, unspecified: Secondary | ICD-10-CM | POA: Diagnosis not present

## 2018-10-28 DIAGNOSIS — I10 Essential (primary) hypertension: Secondary | ICD-10-CM | POA: Diagnosis present

## 2018-10-28 DIAGNOSIS — G3184 Mild cognitive impairment, so stated: Secondary | ICD-10-CM | POA: Diagnosis present

## 2018-10-28 DIAGNOSIS — Z8042 Family history of malignant neoplasm of prostate: Secondary | ICD-10-CM | POA: Diagnosis not present

## 2018-10-28 DIAGNOSIS — R2681 Unsteadiness on feet: Secondary | ICD-10-CM | POA: Diagnosis present

## 2018-10-28 DIAGNOSIS — Z888 Allergy status to other drugs, medicaments and biological substances status: Secondary | ICD-10-CM

## 2018-10-28 DIAGNOSIS — Z79899 Other long term (current) drug therapy: Secondary | ICD-10-CM

## 2018-10-28 DIAGNOSIS — Z20828 Contact with and (suspected) exposure to other viral communicable diseases: Secondary | ICD-10-CM | POA: Diagnosis present

## 2018-10-28 DIAGNOSIS — E119 Type 2 diabetes mellitus without complications: Secondary | ICD-10-CM | POA: Diagnosis present

## 2018-10-28 DIAGNOSIS — J90 Pleural effusion, not elsewhere classified: Secondary | ICD-10-CM | POA: Diagnosis present

## 2018-10-28 DIAGNOSIS — J910 Malignant pleural effusion: Secondary | ICD-10-CM | POA: Diagnosis present

## 2018-10-28 DIAGNOSIS — K5909 Other constipation: Secondary | ICD-10-CM | POA: Diagnosis present

## 2018-10-28 DIAGNOSIS — C3490 Malignant neoplasm of unspecified part of unspecified bronchus or lung: Secondary | ICD-10-CM

## 2018-10-28 DIAGNOSIS — R0602 Shortness of breath: Secondary | ICD-10-CM

## 2018-10-28 DIAGNOSIS — Z66 Do not resuscitate: Secondary | ICD-10-CM | POA: Diagnosis present

## 2018-10-28 DIAGNOSIS — Z9889 Other specified postprocedural states: Secondary | ICD-10-CM

## 2018-10-28 DIAGNOSIS — R109 Unspecified abdominal pain: Secondary | ICD-10-CM | POA: Diagnosis present

## 2018-10-28 DIAGNOSIS — J441 Chronic obstructive pulmonary disease with (acute) exacerbation: Secondary | ICD-10-CM | POA: Diagnosis present

## 2018-10-28 DIAGNOSIS — Z823 Family history of stroke: Secondary | ICD-10-CM | POA: Diagnosis not present

## 2018-10-28 DIAGNOSIS — J440 Chronic obstructive pulmonary disease with acute lower respiratory infection: Secondary | ICD-10-CM | POA: Diagnosis present

## 2018-10-28 DIAGNOSIS — J984 Other disorders of lung: Secondary | ICD-10-CM

## 2018-10-28 LAB — URINALYSIS, COMPLETE (UACMP) WITH MICROSCOPIC
Bacteria, UA: NONE SEEN
Bilirubin Urine: NEGATIVE
Glucose, UA: NEGATIVE mg/dL
Hgb urine dipstick: NEGATIVE
Ketones, ur: NEGATIVE mg/dL
Leukocytes,Ua: NEGATIVE
Nitrite: NEGATIVE
Protein, ur: NEGATIVE mg/dL
Specific Gravity, Urine: 1.013 (ref 1.005–1.030)
Squamous Epithelial / HPF: NONE SEEN (ref 0–5)
pH: 6 (ref 5.0–8.0)

## 2018-10-28 LAB — COMPREHENSIVE METABOLIC PANEL
ALT: 20 U/L (ref 0–44)
AST: 17 U/L (ref 15–41)
Albumin: 3.8 g/dL (ref 3.5–5.0)
Alkaline Phosphatase: 61 U/L (ref 38–126)
Anion gap: 13 (ref 5–15)
BUN: 21 mg/dL (ref 8–23)
CO2: 29 mmol/L (ref 22–32)
Calcium: 9.6 mg/dL (ref 8.9–10.3)
Chloride: 93 mmol/L — ABNORMAL LOW (ref 98–111)
Creatinine, Ser: 0.98 mg/dL (ref 0.61–1.24)
GFR calc Af Amer: >60 mL/min (ref >60)
GFR calc non Af Amer: >60 mL/min (ref >60)
Glucose, Bld: 152 mg/dL — ABNORMAL HIGH (ref 70–99)
Potassium: 3.7 mmol/L (ref 3.5–5.1)
Sodium: 135 mmol/L (ref 135–145)
Total Bilirubin: 0.8 mg/dL (ref 0.3–1.2)
Total Protein: 7.7 g/dL (ref 6.5–8.1)

## 2018-10-28 LAB — SARS CORONAVIRUS 2 BY RT PCR (HOSPITAL ORDER, PERFORMED IN ~~LOC~~ HOSPITAL LAB): SARS Coronavirus 2: NEGATIVE

## 2018-10-28 LAB — LIPASE, BLOOD: Lipase: 13 U/L (ref 11–51)

## 2018-10-28 LAB — PROTIME-INR
INR: 1.1 (ref 0.8–1.2)
Prothrombin Time: 14.2 seconds (ref 11.4–15.2)

## 2018-10-28 LAB — CBC
HCT: 41 % (ref 39.0–52.0)
Hemoglobin: 13.5 g/dL (ref 13.0–17.0)
MCH: 30.8 pg (ref 26.0–34.0)
MCHC: 32.9 g/dL (ref 30.0–36.0)
MCV: 93.6 fL (ref 80.0–100.0)
Platelets: 149 10*3/uL — ABNORMAL LOW (ref 150–400)
RBC: 4.38 MIL/uL (ref 4.22–5.81)
RDW: 12.6 % (ref 11.5–15.5)
WBC: 7.9 10*3/uL (ref 4.0–10.5)
nRBC: 0 % (ref 0.0–0.2)

## 2018-10-28 LAB — GLUCOSE, CAPILLARY
Glucose-Capillary: 201 mg/dL — ABNORMAL HIGH (ref 70–99)
Glucose-Capillary: 209 mg/dL — ABNORMAL HIGH (ref 70–99)

## 2018-10-28 LAB — HEMOGLOBIN A1C
Hgb A1c MFr Bld: 6.7 % — ABNORMAL HIGH (ref 4.8–5.6)
Mean Plasma Glucose: 145.59 mg/dL

## 2018-10-28 MED ORDER — OXYCODONE-ACETAMINOPHEN 7.5-325 MG PO TABS
1.0000 | ORAL_TABLET | ORAL | Status: DC | PRN
  Administered 2018-10-28 – 2018-10-31 (×4): 1 via ORAL
  Filled 2018-10-28 (×3): qty 1

## 2018-10-28 MED ORDER — POTASSIUM CHLORIDE CRYS ER 10 MEQ PO TBCR
10.0000 meq | EXTENDED_RELEASE_TABLET | Freq: Two times a day (BID) | ORAL | Status: DC
  Administered 2018-10-28 – 2018-10-30 (×6): 10 meq via ORAL
  Filled 2018-10-28 (×7): qty 1

## 2018-10-28 MED ORDER — ACETAMINOPHEN 650 MG RE SUPP: 650.0000 mg | Freq: Four times a day (QID) | RECTAL | Status: DC | PRN

## 2018-10-28 MED ORDER — CITALOPRAM HYDROBROMIDE 10 MG PO TABS
10.0000 mg | ORAL_TABLET | Freq: Every day | ORAL | Status: DC
  Administered 2018-10-28 – 2018-10-30 (×3): 10 mg via ORAL
  Filled 2018-10-28 (×4): qty 1

## 2018-10-28 MED ORDER — POLYETHYLENE GLYCOL 3350 17 G PO PACK
17.0000 g | PACK | Freq: Two times a day (BID) | ORAL | Status: DC
  Administered 2018-10-28 – 2018-10-30 (×6): 17 g via ORAL
  Filled 2018-10-28 (×6): qty 1

## 2018-10-28 MED ORDER — METHYLPREDNISOLONE SODIUM SUCC 125 MG IJ SOLR
60.0000 mg | Freq: Two times a day (BID) | INTRAMUSCULAR | Status: DC
  Administered 2018-10-28 – 2018-10-31 (×7): 60 mg via INTRAVENOUS
  Filled 2018-10-28 (×6): qty 2

## 2018-10-28 MED ORDER — INSULIN ASPART 100 UNIT/ML ~~LOC~~ SOLN
0.0000 [IU] | Freq: Three times a day (TID) | SUBCUTANEOUS | Status: DC
  Administered 2018-10-28: 5 [IU] via SUBCUTANEOUS
  Administered 2018-10-29 (×3): 3 [IU] via SUBCUTANEOUS
  Administered 2018-10-30: 5 [IU] via SUBCUTANEOUS
  Administered 2018-10-30: 3 [IU] via SUBCUTANEOUS
  Administered 2018-10-30: 2 [IU] via SUBCUTANEOUS
  Filled 2018-10-28 (×7): qty 1

## 2018-10-28 MED ORDER — SENNA 8.6 MG PO TABS
1.0000 | ORAL_TABLET | Freq: Two times a day (BID) | ORAL | Status: DC
  Administered 2018-10-28 – 2018-10-30 (×6): 8.6 mg via ORAL
  Filled 2018-10-28 (×7): qty 1

## 2018-10-28 MED ORDER — PRAVASTATIN SODIUM 40 MG PO TABS
40.0000 mg | ORAL_TABLET | Freq: Every day | ORAL | Status: DC
  Administered 2018-10-28 – 2018-10-30 (×3): 40 mg via ORAL
  Filled 2018-10-28 (×2): qty 1
  Filled 2018-10-28: qty 2
  Filled 2018-10-28: qty 1
  Filled 2018-10-28: qty 2
  Filled 2018-10-28: qty 1
  Filled 2018-10-28: qty 2

## 2018-10-28 MED ORDER — INSULIN ASPART 100 UNIT/ML ~~LOC~~ SOLN
0.0000 [IU] | Freq: Every day | SUBCUTANEOUS | Status: DC
  Administered 2018-10-28 – 2018-10-30 (×2): 2 [IU] via SUBCUTANEOUS
  Filled 2018-10-28 (×2): qty 1

## 2018-10-28 MED ORDER — IPRATROPIUM-ALBUTEROL 0.5-2.5 (3) MG/3ML IN SOLN
3.0000 mL | Freq: Once | RESPIRATORY_TRACT | Status: AC
  Administered 2018-10-28: 12:00:00 3 mL via RESPIRATORY_TRACT
  Filled 2018-10-28: qty 3

## 2018-10-28 MED ORDER — BISACODYL 10 MG RE SUPP
10.0000 mg | Freq: Every day | RECTAL | Status: DC | PRN
  Administered 2018-10-29: 10 mg via RECTAL
  Filled 2018-10-28 (×2): qty 1

## 2018-10-28 MED ORDER — ENSURE ENLIVE PO LIQD
237.0000 mL | Freq: Three times a day (TID) | ORAL | Status: DC
  Administered 2018-10-28 – 2018-10-29 (×3): 237 mL via ORAL

## 2018-10-28 MED ORDER — IPRATROPIUM-ALBUTEROL 0.5-2.5 (3) MG/3ML IN SOLN
3.0000 mL | Freq: Once | RESPIRATORY_TRACT | Status: AC
  Administered 2018-10-28: 3 mL via RESPIRATORY_TRACT
  Filled 2018-10-28: qty 3

## 2018-10-28 MED ORDER — ONDANSETRON HCL 4 MG PO TABS: 4.0000 mg | ORAL_TABLET | Freq: Four times a day (QID) | ORAL | Status: DC | PRN

## 2018-10-28 MED ORDER — MAGNESIUM HYDROXIDE 400 MG/5ML PO SUSP
30.0000 mL | Freq: Every day | ORAL | Status: DC | PRN
  Administered 2018-10-29: 30 mL via ORAL
  Filled 2018-10-28: qty 30

## 2018-10-28 MED ORDER — TRIAMTERENE-HCTZ 37.5-25 MG PO TABS
1.0000 | ORAL_TABLET | Freq: Every day | ORAL | Status: DC
  Administered 2018-10-30: 09:00:00 1 via ORAL
  Filled 2018-10-28 (×4): qty 1

## 2018-10-28 MED ORDER — SODIUM CHLORIDE 0.9% FLUSH
3.0000 mL | Freq: Once | INTRAVENOUS | Status: AC
  Administered 2018-10-28: 3 mL via INTRAVENOUS

## 2018-10-28 MED ORDER — ACETAMINOPHEN 325 MG PO TABS: 650.0000 mg | ORAL_TABLET | Freq: Four times a day (QID) | ORAL | Status: DC | PRN

## 2018-10-28 MED ORDER — ONDANSETRON HCL 4 MG/2ML IJ SOLN: 4.0000 mg | Freq: Four times a day (QID) | INTRAMUSCULAR | Status: DC | PRN

## 2018-10-28 MED ORDER — FOLIC ACID 1 MG PO TABS
1.0000 mg | ORAL_TABLET | Freq: Every day | ORAL | Status: DC
  Administered 2018-10-28 – 2018-10-30 (×3): 1 mg via ORAL
  Filled 2018-10-28 (×4): qty 1

## 2018-10-28 MED ORDER — METOPROLOL TARTRATE 25 MG PO TABS
25.0000 mg | ORAL_TABLET | Freq: Two times a day (BID) | ORAL | Status: DC
  Administered 2018-10-28 – 2018-10-30 (×5): 25 mg via ORAL
  Filled 2018-10-28 (×7): qty 1

## 2018-10-28 MED ORDER — AMLODIPINE BESYLATE 5 MG PO TABS
5.0000 mg | ORAL_TABLET | Freq: Every day | ORAL | Status: DC
  Administered 2018-10-30: 09:00:00 5 mg via ORAL
  Filled 2018-10-28 (×3): qty 1

## 2018-10-28 NOTE — Progress Notes (Signed)
Advance care planning  Purpose of Encounter Acute bronchitis, adenocarcinoma of the lung, right pleural effusion  Parties in Attendance Patient, wife at bedside  Patients Decisional capacity Alert and oriented.  Able to make medical decisions.  His documented healthcare power of attorney is his wife.  Discussed in detail regarding Acute bronchitis, adenocarcinoma of the lung, right pleural effusion.  Treatment plan , prognosis discussed.  All questions answered  CODE STATUS discussed.  Patient and wife tells me they have decided to continue chemotherapy and hope for the best but do not want any CPR/defibrillation or ventilatory support.  Orders entered and CODE STATUS changed  DNR/DNI  Time spent - 17 minutes

## 2018-10-28 NOTE — ED Notes (Signed)
Pt reporting he is aware of the urine sample but does not have to urinate at this time. Pt resting in bed in NAD at this time.

## 2018-10-28 NOTE — ED Notes (Signed)
Admitting MD at bedside.

## 2018-10-28 NOTE — ED Provider Notes (Signed)
[facility] Emergency Department Provider Note  ____________________________________________   First MD Initiated Contact with Patient 10/28/18 1101     (approximate)  I have reviewed the triage vital signs and the nursing notes.   HISTORY  Chief Complaint Shortness of Breath and Abdominal Pain    HPI Andres Hebert is a 85 y.o. male who was seen by myself on 9/10 for shortness of breath with CT PE showing new lung mass and pleural effusion.  Patient was admitted and had thoracentesis done.  He was diagnosed with adenocarcinoma.  He is followed by oncology.  Patient was sent in today from oncology for shortness of breath and his chest x-ray showed reaccumulation of his effusion.  His shortness of breath has been gradual getting worse since discharge with it be worse last few days, moderate, nothing makes it better and worse with exertion.  He also has had decreased bowel movements but no abdominal pain.          Past Medical History:  Diagnosis Date  . BPV (benign positional vertigo)   . Chronic constipation   . Diabetes mellitus without complication ([place])   . Hyperlipidemia   . Hypertension     Patient Active Problem List   Diagnosis Date Noted  . Primary adenocarcinoma of lower lobe of right lung ([place]) 10/13/2018  . Bone metastasis ([place]) 10/13/2018  . Goals of care, counseling/discussion 10/13/2018  . Cancer-related pain 10/13/2018  . Pleural effusion 10/05/2018  . Diabetes mellitus type 2, uncomplicated ([place]) 60/73/7106    History reviewed. No pertinent surgical history.  Prior to Admission medications   Medication Sig Start Date End Date Taking? Authorizing Provider  amLODipine (NORVASC) 5 MG tablet Take 5 mg by mouth daily.    [provider]  citalopram (CELEXA) 10 MG tablet Take 1 tablet by mouth daily. 11/14/17 11/14/18  [provider]  feeding supplement, ENSURE ENLIVE, (ENSURE ENLIVE) LIQD Take 237 mLs by mouth  3 (three) times daily between meals. 10/08/18   Stark Jock Jude, MD  folic acid (FOLVITE) 1 MG tablet Take 1 tablet (1 mg total) by mouth daily. 10/24/18   Lequita Asal, MD  oxyCODONE-acetaminophen (PERCOCET/ROXICET) 5-325 MG tablet Take 1 tablet by mouth every 4 (four) hours as needed for moderate pain. 10/24/18   Lequita Asal, MD  potassium chloride (K-DUR,KLOR-CON) 10 MEQ tablet Take 10 mEq by mouth 2 (two) times daily.    [provider]  pravastatin (PRAVACHOL) 40 MG tablet Take 40 mg by mouth daily.    [provider]  senna (SENOKOT) 8.6 MG TABS tablet Take 1 tablet (8.6 mg total) by mouth daily as needed for mild constipation. 10/08/18   Stark Jock Jude, MD  triamterene-hydrochlorothiazide (MAXZIDE-25) 37.5-25 MG tablet Take 1 tablet by mouth daily.    [provider]    Allergies Accupril [quinapril hcl]  Family History  Problem Relation Age of Onset  . Stroke Mother   . Prostate cancer Father     Social History Social History   Tobacco Use  . Smoking status: Former Smoker    Packs/day: 1.00    Years: 25.00    Pack years: 25.00    Types: Cigarettes    Quit date: 1976    Years since quitting: 44.7  . Smokeless tobacco: Never Used  Substance Use Topics  . Alcohol use: Not Currently  . Drug use: Never      Review of Systems Constitutional: No fever/chills Eyes: No visual changes. ENT:  No sore throat. Cardiovascular: No chest pain Respiratory: Positive for SOB Gastrointestinal: No abdominal pain.  No nausea, no vomiting.  Positive constipation Genitourinary: Negative for dysuria. Musculoskeletal: Negative for back pain. Skin: Negative for rash. Neurological: Negative for headaches, focal weakness or numbness. All other ROS negative ____________________________________________   PHYSICAL EXAM:  VITAL SIGNS: ED Triage Vitals  Enc Vitals Group     BP 10/28/18 0808 129/67     Pulse Rate 10/28/18 0808 72     Resp 10/28/18 0808 (!)  22     Temp 10/28/18 0808 98.2 F (36.8 C)     Temp Source 10/28/18 0808 Oral     SpO2 10/28/18 0808 92 %     Weight 10/28/18 0809 160 lb (72.6 kg)     Height 10/28/18 0809 5\' 6"  (1.676 m)     Head Circumference --      Peak Flow --      Pain Score 10/28/18 0809 7     Pain Loc --      Pain Edu? --      Excl. in [place]? --     Constitutional: Alert and oriented. Well appearing and in no acute distress. Eyes: Conjunctivae are normal. EOMI. Head: Atraumatic. Nose: No congestion/rhinnorhea. Mouth/Throat: Mucous membranes are moist.   Neck: No stridor. Trachea Midline. FROM Cardiovascular: Normal rate, regular rhythm. Grossly normal heart sounds.  Good peripheral circulation. Respiratory: Decreased breath sounds on the right, mild increased work of breathing, on 2 L nasal cannula Gastrointestinal: Soft and nontender. No distention. No abdominal bruits.  Musculoskeletal: No lower extremity tenderness nor edema.  No joint effusions. Neurologic:  Normal speech and language. No gross focal neurologic deficits are appreciated.  Skin:  Skin is warm, dry and intact. No rash noted. Psychiatric: Mood and affect are normal. Speech and behavior are normal. GU: Deferred   ____________________________________________   LABS (all labs ordered are listed, but only abnormal results are displayed)  Labs Reviewed  COMPREHENSIVE METABOLIC PANEL - Abnormal; Notable for the following components:      Result Value   Chloride 93 (*)    Glucose, Bld 152 (*)    All other components within normal limits  CBC - Abnormal; Notable for the following components:   Platelets 149 (*)    All other components within normal limits  LIPASE, BLOOD  URINALYSIS, COMPLETE (UACMP) WITH MICROSCOPIC   ____________________________________________  RADIOLOGY Robert Bellow, personally viewed and evaluated these images (plain radiographs) as part of my medical decision making, as well as reviewing the written report by  the radiologist.  ED MD interpretation: Reviewed x-ray from yesterday and there is a moderate right pleural effusion  Official radiology report(s): Dg Chest 2 View  Result Date: 10/27/2018 CLINICAL DATA:  Right lung malignancy. EXAM: CHEST - 2 VIEW COMPARISON:  Chest radiograph dated 10/24/2018 and CT chest dated 10/05/2018 FINDINGS: A moderate right pleural effusion with associated atelectasis has increased since prior exam. Right lateral nodular pleural thickening is not significantly changed. The left lung is clear. There is no pneumothorax. Emphysematous changes are noted. The cardiomediastinal silhouette is unchanged. Calcifications are seen in the aortic arch. IMPRESSION: Increased moderate right malignant pleural effusion. Aortic Atherosclerosis (ICD10-I70.0) and Emphysema (ICD10-J43.9). Electronically Signed   By: Zerita Boers M.D.   On: 10/27/2018 13:09   Dg Abd 2 Views  Result Date: 10/27/2018 CLINICAL DATA:  Abdominal pain EXAM: ABDOMEN - 2 VIEW COMPARISON:  CT abdomen pelvis dated 09/16/2012. PET/CT dated 10/18/2018. FINDINGS: The  bowel gas pattern is nonobstructive. There is no evidence of free air. No radio-opaque calculi or other significant radiographic abnormality is seen. A right pleural effusion with associated mass representing the patient's known malignancy is partially imaged. IMPRESSION: Nonobstructive bowel gas pattern. Electronically Signed   By: Zerita Boers M.D.   On: 10/27/2018 13:06    ____________________________________________   PROCEDURES  Procedure(s) performed (including Critical Care):  Procedures   ____________________________________________   INITIAL IMPRESSION / ASSESSMENT AND PLAN / ED COURSE   Andres Hebert was evaluated in Emergency Department on 10/28/2018 for the symptoms described in the history of present illness. He was evaluated in the context of the global COVID-19 pandemic, which necessitated consideration that the patient might be  at risk for infection with the SARS-CoV-2 virus that causes COVID-19. Institutional protocols and algorithms that pertain to the evaluation of patients at risk for COVID-19 are in a state of rapid change based on information released by regulatory bodies including the CDC and federal and state organizations. These policies and algorithms were followed during the patient's care in the ED.     Pt presents with SOB.  This is most likely secondary to the pleural effusion given his recent admission for similar. PNA-will get xray to evaluation Anemia-CBC to evaluate ACS-denies chest pain given this feels very similar to when he presented last time will hold off on cardiac markers. Arrhythmia-Will get EKG and keep on monitor.  COVID- will get testing per algorithm. PE-lower suspicion given no risk factors and other cause more likely and recent CT PE was negative.  Chest x-ray from yesterday shows pleural effusion.  Discussed with hospital team and they wanted [place] to give some breathing treatments given little bit of wheezing as well.  Patient does not have any history of COPD but will do some breathing treatments to see if it helps.  We will add on rapid COVID.  Discussed the hospital team for admission for his shortness of breath and pleural effusion  ____________________________________________   FINAL CLINICAL IMPRESSION(S) / ED DIAGNOSES   Final diagnoses:  Pleural effusion  Malignant neoplasm of lung, unspecified laterality, unspecified part of lung ([place])     MEDICATIONS GIVEN DURING THIS VISIT:  Medications  sodium chloride flush (NS) 0.9 % injection 3 mL (has no administration in time range)  ipratropium-albuterol (DUONEB) 0.5-2.5 (3) MG/3ML nebulizer solution 3 mL (has no administration in time range)  ipratropium-albuterol (DUONEB) 0.5-2.5 (3) MG/3ML nebulizer solution 3 mL (has no administration in time range)  ipratropium-albuterol (DUONEB) 0.5-2.5 (3) MG/3ML nebulizer solution  3 mL (has no administration in time range)     ED Discharge Orders    None       Note:  This document was prepared using Dragon voice recognition software and may include unintentional dictation errors.   Vanessa [place], MD 10/28/18 [phone]

## 2018-10-28 NOTE — Consult Note (Signed)
Pulmonary Medicine          Date: 10/28/2018,   MRN# [id number] Andres Hebert [facility] 06-Jul-1931     AdmissionWeight: 72.6 kg                 CurrentWeight: 72.6 kg      CHIEF COMPLAINT:   Shortness of breath due to right-sided pleural effusion   HISTORY OF PRESENT ILLNESS   This is a pleasant 85 year old male with a history of BPV, mild cognitive impairment with signs of dementia as per wife, dyslipidemia, essential hypertension, recent diagnosis of stage IV lung CA with malignant pleural effusion currently being worked up by oncology with plans for CT-guided biopsy to evaluate for potential immunotherapy options.  Patient has a malignant pleural effusion on the right which appears to be worsening based on interval changes on chest x-ray.  Yesterday x-ray showed moderate effusion.  I discussed this with wife and patient and they wish to have fluid drained so that they can go home as this helped patient's symptoms previously.  I have also recommended a permanent Pleurx catheter due to high likelihood of recurrence in the context of malignancy.   PAST MEDICAL HISTORY   Past Medical History:  Diagnosis Date   BPV (benign positional vertigo)    Chronic constipation    Diabetes mellitus without complication ([place])    Hyperlipidemia    Hypertension      SURGICAL HISTORY   History reviewed. No pertinent surgical history.   FAMILY HISTORY   Family History  Problem Relation Age of Onset   Stroke Mother    Prostate cancer Father      SOCIAL HISTORY   Social History   Tobacco Use   Smoking status: Former Smoker    Packs/day: 1.00    Years: 25.00    Pack years: 25.00    Types: Cigarettes    Quit date: 1976    Years since quitting: 44.7   Smokeless tobacco: Never Used  Substance Use Topics   Alcohol use: Not Currently   Drug use: Never     MEDICATIONS    Home Medication:    Current Medication:  Current Facility-Administered Medications:     acetaminophen (TYLENOL) tablet 650 mg, 650 mg, Oral, Q6H PRN **OR** acetaminophen (TYLENOL) suppository 650 mg, 650 mg, Rectal, Q6H PRN, Sudini, Srikar, MD   amLODipine (NORVASC) tablet 5 mg, 5 mg, Oral, Daily, Sudini, Srikar, MD   bisacodyl (DULCOLAX) suppository 10 mg, 10 mg, Rectal, Daily PRN, Sudini, Srikar, MD   citalopram (CELEXA) tablet 10 mg, 10 mg, Oral, Daily, Sudini, Artist, MD   feeding supplement (ENSURE ENLIVE) (ENSURE ENLIVE) liquid 237 mL, 237 mL, Oral, TID BM, Sudini, Srikar, MD   folic acid (FOLVITE) tablet 1 mg, 1 mg, Oral, Daily, Sudini, Srikar, MD   insulin aspart (novoLOG) injection 0-15 Units, 0-15 Units, Subcutaneous, TID WC, Sudini, Srikar, MD   insulin aspart (novoLOG) injection 0-5 Units, 0-5 Units, Subcutaneous, QHS, Sudini, Srikar, MD   magnesium hydroxide (MILK OF MAGNESIA) suspension 30 mL, 30 mL, Oral, Daily PRN, Sudini, Srikar, MD   methylPREDNISolone sodium succinate (SOLU-MEDROL) 125 mg/2 mL injection 60 mg, 60 mg, Intravenous, BID, Sudini, Srikar, MD, 60 mg at 10/28/18 1331   ondansetron (ZOFRAN) tablet 4 mg, 4 mg, Oral, Q6H PRN **OR** ondansetron (ZOFRAN) injection 4 mg, 4 mg, Intravenous, Q6H PRN, Sudini, Srikar, MD   oxyCODONE-acetaminophen (PERCOCET) 7.5-325 MG per tablet 1 tablet, 1 tablet, Oral, Q4H PRN, Hillary Bow, MD, 1 tablet  at 10/28/18 1433   polyethylene glycol (MIRALAX / GLYCOLAX) packet 17 g, 17 g, Oral, BID, Sudini, Srikar, MD   potassium chloride SA (KLOR-CON) CR tablet 10 mEq, 10 mEq, Oral, BID, Sudini, Srikar, MD   pravastatin (PRAVACHOL) tablet 40 mg, 40 mg, Oral, Daily, Sudini, Artist, MD   senna (SENOKOT) tablet 8.6 mg, 1 tablet, Oral, BID, Sudini, Srikar, MD   triamterene-hydrochlorothiazide (MAXZIDE-25) 37.5-25 MG per tablet 1 tablet, 1 tablet, Oral, Daily, Sudini, Artist, MD    ALLERGIES   Accupril [quinapril hcl]     REVIEW OF SYSTEMS    Review of Systems:  Gen:  Denies  fever, sweats, chills weigh  loss  HEENT: Denies blurred vision, double vision, ear pain, eye pain, hearing loss, nose bleeds, sore throat Cardiac:  No dizziness, chest pain or heaviness, chest tightness,edema Resp:   Denies cough or sputum porduction, shortness of breath,wheezing, hemoptysis,  Gi: Denies swallowing difficulty, stomach pain, nausea or vomiting, diarrhea, constipation, bowel incontinence Gu:  Denies bladder incontinence, burning urine Ext:   Denies Joint pain, stiffness or swelling Skin: Denies  skin rash, easy bruising or bleeding or hives Endoc:  Denies polyuria, polydipsia , polyphagia or weight change Psych:   Denies depression, insomnia or hallucinations   Other:  All other systems negative   VS: BP 94/66    Pulse (!) 103    Temp 98.1 F (36.7 C) (Oral)    Resp (!) 22    Ht 5\' 6"  (1.676 m)    Wt 72.6 kg    SpO2 95%    BMI 25.82 kg/m      PHYSICAL EXAM    GENERAL:NAD, no fevers, chills, no weakness no fatigue HEAD: Normocephalic, atraumatic.  EYES: Pupils equal, round, reactive to light. Extraocular muscles intact. No scleral icterus.  MOUTH: Moist mucosal membrane. Dentition intact. No abscess noted.  EAR, NOSE, THROAT: Clear without exudates. No external lesions.  NECK: Supple. No thyromegaly. No nodules. No JVD.  PULMONARY: Decreased breath sounds on right CARDIOVASCULAR: S1 and S2. Regular rate and rhythm. No murmurs, rubs, or gallops. No edema. Pedal pulses 2+ bilaterally.  GASTROINTESTINAL: Soft, nontender, nondistended. No masses. Positive bowel sounds. No hepatosplenomegaly.  MUSCULOSKELETAL: No swelling, clubbing, or edema. Range of motion full in all extremities.  NEUROLOGIC: Cranial nerves II through XII are intact. No gross focal neurological deficits. Sensation intact. Reflexes intact.  SKIN: No ulceration, lesions, rashes, or cyanosis. Skin warm and dry. Turgor intact.  PSYCHIATRIC: Mood, affect within normal limits. The patient is awake, alert and oriented x 3. Insight,  judgment intact.       IMAGING    Ct Angio Head W Or Wo Contrast  Result Date: 10/08/2018 CLINICAL DATA:  Lung cancer. Balance disturbance. Abnormal flow left vertebral artery seen by MRI. EXAM: CT ANGIOGRAPHY HEAD AND NECK TECHNIQUE: Multidetector CT imaging of the head and neck was performed using the standard protocol during bolus administration of intravenous contrast. Multiplanar CT image reconstructions and MIPs were obtained to evaluate the vascular anatomy. Carotid stenosis measurements (when applicable) are obtained utilizing NASCET criteria, using the distal internal carotid diameter as the denominator. CONTRAST:  48mL OMNIPAQUE IOHEXOL 350 MG/ML SOLN COMPARISON:  MRI 10/07/2018 FINDINGS: CT HEAD FINDINGS Brain: Mild age related volume loss. Mild chronic appearing small vessel change of the hemispheric white matter. No sign of acute infarction, mass lesion, hemorrhage, hydrocephalus or extra-axial collection. Vascular: Negative noncontrast appearance. Skull: Normal Sinuses: Clear Orbits: Normal Review of the MIP images confirms the above findings CTA  NECK FINDINGS Aortic arch: Aortic atherosclerosis. No aneurysm or dissection. Branching pattern shows the congenital variation of a small left vertebral artery arising from the arch. Right carotid system: Common carotid artery widely patent to the bifurcation. Ordinary atherosclerotic plaque at the carotid bifurcation and ICA bulb but no stenosis. Cervical ICA widely patent. Left carotid system: Common carotid artery widely patent to the bifurcation. Ordinary calcified plaque at the carotid bifurcation and ICA bulb but no stenosis. Cervical ICA widely patent. Vertebral arteries: Dominant left vertebral artery is widely patent at its origin and through the cervical region to the foramen magnum. Non dominant small left vertebral artery arises directly from the arch and is patent through the cervical region to the foramen magnum. Skeleton: Ordinary  cervical spondylosis. Other neck: No mass or lymphadenopathy. Upper chest: See results recent chest evaluations. Scarring and emphysema at the lung apices. Layering pleural fluid on the right. Review of the MIP images confirms the above findings CTA HEAD FINDINGS Anterior circulation: Both internal carotid arteries are patent through the skull base and siphon regions. No siphon stenosis. Anterior and middle cerebral vessels are patent without proximal stenosis, aneurysm or vascular malformation. Posterior circulation: Dominant right vertebral artery is widely patent through the foramen magnum to the basilar. Congenital small left vertebral artery is patent through the foramen magnum, supplying left PICA, with a very rudimentary connection to the basilar. No basilar stenosis. Superior cerebellar and posterior cerebral arteries show flow. No stenosis or aneurysm. Venous sinuses: Patent and normal. Anatomic variants: None other significant. Review of the MIP images confirms the above findings IMPRESSION: Ordinary aortic atherosclerosis. Ordinary atherosclerosis at the carotid bifurcations without stenosis. No intracranial anterior circulation finding of significance. Patient has the congenital variation of asymmetric vertebral anatomy. Dominant normal appearing right vertebral artery widely patent to the basilar. Tiny left vertebral artery arising directly from the arch, but patent through the neck, supplying left PICA and with a rudimentary contribution to the basilar. This is not likely to be of any clinical relevance. Electronically Signed   By: Nelson Chimes M.D.   On: 10/08/2018 10:22   Dg Chest 2 View  Result Date: 10/27/2018 CLINICAL DATA:  Right lung malignancy. EXAM: CHEST - 2 VIEW COMPARISON:  Chest radiograph dated 10/24/2018 and CT chest dated 10/05/2018 FINDINGS: A moderate right pleural effusion with associated atelectasis has increased since prior exam. Right lateral nodular pleural thickening is not  significantly changed. The left lung is clear. There is no pneumothorax. Emphysematous changes are noted. The cardiomediastinal silhouette is unchanged. Calcifications are seen in the aortic arch. IMPRESSION: Increased moderate right malignant pleural effusion. Aortic Atherosclerosis (ICD10-I70.0) and Emphysema (ICD10-J43.9). Electronically Signed   By: Zerita Boers M.D.   On: 10/27/2018 13:09   Dg Chest 2 View  Result Date: 10/24/2018 CLINICAL DATA:  Assess pleural fluid EXAM: CHEST - 2 VIEW COMPARISON:  10/06/2018, CT chest, 10/05/2018 FINDINGS: Interval increase in a right-sided pleural effusion, now moderate, with associated atelectasis or consolidation and multiple pleural based nodules, better characterized by prior CT. Emphysema. The heart mediastinum are unremarkable. Disc degenerative disease of the thoracic spine IMPRESSION: 1. Interval increase in a right-sided pleural effusion, now moderate, with associated atelectasis or consolidation and multiple pleural based nodules, better characterized by prior CT. 2.  Emphysema. Electronically Signed   By: Eddie Candle M.D.   On: 10/24/2018 10:53   Dg Chest 2 View  Result Date: 10/05/2018 CLINICAL DATA:  Shortness of breath EXAM: CHEST - 2 VIEW COMPARISON:  None.  FINDINGS: Small right pleural effusion with lateral loculated component versus pleural nodularity. The lungs are hyperexpanded with increased interstitial markings. Heart size is normal. There is calcific aortic atherosclerosis. IMPRESSION: Small right pleural effusion with lateral loculated component versus pleural nodularity. In the setting of recent trauma, this may indicate nondisplaced or minimally displaced rib fractures with a small hemothorax. CT may be helpful for further investigation. Electronically Signed   By: Ulyses Jarred M.D.   On: 10/05/2018 19:10   Ct Angio Neck W Or Wo Contrast  Result Date: 10/08/2018 CLINICAL DATA:  Lung cancer. Balance disturbance. Abnormal flow left  vertebral artery seen by MRI. EXAM: CT ANGIOGRAPHY HEAD AND NECK TECHNIQUE: Multidetector CT imaging of the head and neck was performed using the standard protocol during bolus administration of intravenous contrast. Multiplanar CT image reconstructions and MIPs were obtained to evaluate the vascular anatomy. Carotid stenosis measurements (when applicable) are obtained utilizing NASCET criteria, using the distal internal carotid diameter as the denominator. CONTRAST:  89mL OMNIPAQUE IOHEXOL 350 MG/ML SOLN COMPARISON:  MRI 10/07/2018 FINDINGS: CT HEAD FINDINGS Brain: Mild age related volume loss. Mild chronic appearing small vessel change of the hemispheric white matter. No sign of acute infarction, mass lesion, hemorrhage, hydrocephalus or extra-axial collection. Vascular: Negative noncontrast appearance. Skull: Normal Sinuses: Clear Orbits: Normal Review of the MIP images confirms the above findings CTA NECK FINDINGS Aortic arch: Aortic atherosclerosis. No aneurysm or dissection. Branching pattern shows the congenital variation of a small left vertebral artery arising from the arch. Right carotid system: Common carotid artery widely patent to the bifurcation. Ordinary atherosclerotic plaque at the carotid bifurcation and ICA bulb but no stenosis. Cervical ICA widely patent. Left carotid system: Common carotid artery widely patent to the bifurcation. Ordinary calcified plaque at the carotid bifurcation and ICA bulb but no stenosis. Cervical ICA widely patent. Vertebral arteries: Dominant left vertebral artery is widely patent at its origin and through the cervical region to the foramen magnum. Non dominant small left vertebral artery arises directly from the arch and is patent through the cervical region to the foramen magnum. Skeleton: Ordinary cervical spondylosis. Other neck: No mass or lymphadenopathy. Upper chest: See results recent chest evaluations. Scarring and emphysema at the lung apices. Layering pleural  fluid on the right. Review of the MIP images confirms the above findings CTA HEAD FINDINGS Anterior circulation: Both internal carotid arteries are patent through the skull base and siphon regions. No siphon stenosis. Anterior and middle cerebral vessels are patent without proximal stenosis, aneurysm or vascular malformation. Posterior circulation: Dominant right vertebral artery is widely patent through the foramen magnum to the basilar. Congenital small left vertebral artery is patent through the foramen magnum, supplying left PICA, with a very rudimentary connection to the basilar. No basilar stenosis. Superior cerebellar and posterior cerebral arteries show flow. No stenosis or aneurysm. Venous sinuses: Patent and normal. Anatomic variants: None other significant. Review of the MIP images confirms the above findings IMPRESSION: Ordinary aortic atherosclerosis. Ordinary atherosclerosis at the carotid bifurcations without stenosis. No intracranial anterior circulation finding of significance. Patient has the congenital variation of asymmetric vertebral anatomy. Dominant normal appearing right vertebral artery widely patent to the basilar. Tiny left vertebral artery arising directly from the arch, but patent through the neck, supplying left PICA and with a rudimentary contribution to the basilar. This is not likely to be of any clinical relevance. Electronically Signed   By: Nelson Chimes M.D.   On: 10/08/2018 10:22   Ct Angio Chest Pe  W And/or Wo Contrast  Result Date: 10/05/2018 CLINICAL DATA:  Chest pain EXAM: CT ANGIOGRAPHY CHEST WITH CONTRAST TECHNIQUE: Multidetector CT imaging of the chest was performed using the standard protocol during bolus administration of intravenous contrast. Multiplanar CT image reconstructions and MIPs were obtained to evaluate the vascular anatomy. CONTRAST:  11mL OMNIPAQUE IOHEXOL 350 MG/ML SOLN COMPARISON:  Chest x-ray today FINDINGS: Cardiovascular: No filling defects in the  pulmonary arteries to suggest pulmonary emboli. Heart is normal size. Aorta is normal caliber. Coronary artery and aortic atherosclerosis. Mediastinum/Nodes: Enlarged right hilar lymph nodes with index node having a short axis diameter of 10 mm. No mediastinal, left hilar or axillary adenopathy. Lungs/Pleura: There is concern for right lower lobe mass measuring approximately 8.4 x 6.9 cm. Associated large right joint effusion with pleural nodules concerning for metastatic pleural involvement. Compressive atelectasis in the right lower lobe. No focal opacity on the left. Moderate to advanced emphysema. Upper Abdomen: Imaging into the upper abdomen shows no acute findings. Musculoskeletal: Destructive mass noted in the lateral right 7th rib. Bone destruction also in the lateral right 8th rib. Diffuse degenerative changes in the thoracic spine. Review of the MIP images confirms the above findings. IMPRESSION: Large right lower lobe mass measures up to 8.4 cm most compatible with lung cancer. This is associated with numerous pleural nodules, large right pleural effusion compatible with pleural metastatic disease. There also bony destructive lytic lesions in the right lateral 7th and 8th ribs. Moderate to advanced emphysema. No evidence of pulmonary embolus. Aortic Atherosclerosis (ICD10-I70.0) and Emphysema (ICD10-J43.9). Electronically Signed   By: Rolm Baptise M.D.   On: 10/05/2018 21:10   Mr Jeri Cos PN Contrast  Result Date: 10/07/2018 CLINICAL DATA:  85 year old male with large right lung mass. Loss of balance over the past 3-4 months. Staging. EXAM: MRI HEAD WITHOUT AND WITH CONTRAST TECHNIQUE: Multiplanar, multiecho pulse sequences of the brain and surrounding structures were obtained without and with intravenous contrast. CONTRAST:  1mL GADAVIST GADOBUTROL 1 MMOL/ML IV SOLN COMPARISON:  None. FINDINGS: Brain: Mild motion artifact on post-contrast images. No abnormal enhancement identified. No midline shift,  mass effect, or evidence of intracranial mass lesion. Cerebral volume is within normal limits for age. No restricted diffusion to suggest acute infarction. No ventriculomegaly, extra-axial collection or acute intracranial hemorrhage. Cervicomedullary junction and pituitary are within normal limits. Minimal to mild for age scattered nonspecific cerebral white matter T2 and FLAIR hyperintensity. No cortical encephalomalacia or chronic cerebral blood products. There is a possible small chronic subcortical white matter lacune in the left anterior frontal lobe on series 10, image 15. Vascular: Loss of the distal left vertebral artery flow void at the skull base although there does appear to be some left V4 segment collateral flow. Other Major intracranial vascular flow voids are preserved. The major dural venous sinuses are enhancing and appear to be patent. Skull and upper cervical spine: Negative visible cervical spine and spinal cord. Visible bone marrow signal is normal. Sinuses/Orbits: Postoperative changes to the globes, otherwise negative orbits. Paranasal sinuses are clear. Other: Mastoids are clear. Visible internal auditory structures appear normal. Scalp and face soft tissues appear negative. IMPRESSION: 1. No metastatic disease or acute intracranial abnormality identified. 2. Distal left vertebral artery with poor flow or occlusion, most likely due to atherosclerosis. CTA or MRA of the neck and head could evaluate further as necessary. Electronically Signed   By: Genevie Ann M.D.   On: 10/07/2018 16:50   Nm Pet Image Initial (pi) Skull  Base To Thigh  Result Date: 10/18/2018 CLINICAL DATA:  Initial treatment strategy for lung adenocarcinoma. RIGHT lower lobe mass. EXAM: NUCLEAR MEDICINE PET SKULL BASE TO THIGH TECHNIQUE: 8.0 mCi F-18 FDG was injected intravenously. Full-ring PET imaging was performed from the skull base to thigh after the radiotracer. CT data was obtained and used for attenuation correction  and anatomic localization. Fasting blood glucose: 150 mg/dl COMPARISON:  Chest CT October 05, 2018 FINDINGS: Mediastinal blood pool activity: SUV max 2.64 Liver activity: SUV max NA NECK: No hypermetabolic cervical lymph nodes. Incidental CT findings: none Chest: Hypermetabolic mass in the RIGHT lower lobe measures 7.5 x 5.7 cm with intense metabolic activity (SUV max equal 17.8). There is hypermetabolic pleural thickening in the RIGHT upper lobe and RIGHT lower lobe. For example, RIGHT upper lobe pleural nodule measuring 2.9 x 1.4 cm witth SUV max equal 7.7. There is a moderate RIGHT pleural effusion. Within the fusion is a hypermetabolic pleural nodule SUV max equal 4.9. Approximately 12 hypermetabolic pleural nodules in the RIGHT lung. One of these pleural nodules invades the cortex of the adjacent 7th rib (image 218 of the fused data set). Hypermetabolic RIGHT hilar lymph node. No central hypermetabolic mediastinal lymph nodes. No hypermetabolic supraclavicular adenopathy. Incidental CT findings: none ABDOMEN/PELVIS: Adjacent to the RIGHT adrenal gland is hypermetabolic nodular thickening along the diaphragm. Adrenal glands are normal. No abnormal activity within the liver. No hypermetabolic upper abdominal lymph nodes. No hypermetabolic pelvic lymph nodes. Incidental CT findings: Prostate enlarged. Atherosclerotic calcification of the aorta. SKELETON: Cortical invasion of the RIGHT seventh rib as described in the chest section no additional evidence skeletal metastasis Incidental CT findings: none IMPRESSION: 1. Large hypermetabolic RIGHT lower lobe mass consistent bronchogenic carcinoma. 2. Multifocal pleural metastasis within the RIGHT hemithorax. 3. One pleural metastatic lesion invades adjacent RIGHT seventh rib. 4. Moderate RIGHT effusion. 5. Probable RIGHT hilar metastatic adenopathy. No mediastinal or supraclavicular metastatic adenopathy. Electronically Signed   By: Suzy Bouchard M.D.   On:  10/18/2018 12:28   Dg Chest Port 1 View  Result Date: 10/06/2018 CLINICAL DATA:  Post right-sided thoracentesis EXAM: PORTABLE CHEST 1 VIEW COMPARISON:  Chest radiograph-10/05/2018; chest CT-10/05/2018 FINDINGS: Grossly unchanged cardiac silhouette and mediastinal contours with atherosclerotic plaque within the thoracic aorta. Interval reduction/near resolution of right-sided pleural effusion post thoracentesis. No pneumothorax. Improved aeration of the right lung base with persistent masslike consolidative opacities and partial atelectasis/collapse. There is persistent pleuroparenchymal thickening about the peripheral aspect of the right mid lung. The left hemithorax remains well aerated. Similar findings of lung hyperexpansion. No evidence of edema. No acute osseous abnormalities. Degenerative change the right glenohumeral joint. IMPRESSION: 1. Interval reduction/near resolution of right-sided pleural effusion post thoracentesis. No pneumothorax. 2. Improved aeration of the right lower lung with persistent masslike consolidative opacities and partial atelectasis/collapse. 3. Grossly unchanged pleuroparenchymal thickening along the right lateral chest wall compatible with suspected pleural metastasis demonstrated on preceding chest CT. 4. Aortic Atherosclerosis (ICD10-I70.0) and Emphysema (ICD10-J43.9). Electronically Signed   By: Sandi Mariscal M.D.   On: 10/06/2018 10:04   Dg Abd 2 Views  Result Date: 10/27/2018 CLINICAL DATA:  Abdominal pain EXAM: ABDOMEN - 2 VIEW COMPARISON:  CT abdomen pelvis dated 09/16/2012. PET/CT dated 10/18/2018. FINDINGS: The bowel gas pattern is nonobstructive. There is no evidence of free air. No radio-opaque calculi or other significant radiographic abnormality is seen. A right pleural effusion with associated mass representing the patient's known malignancy is partially imaged. IMPRESSION: Nonobstructive bowel gas pattern. Electronically Signed  By: Zerita Boers M.D.   On:  10/27/2018 13:06   US Thoracentesis Asp Pleural Space W/img Guide  Result Date: 10/06/2018 INDICATION: No known primary, now with concern for metastatic lung cancer with symptomatic right-sided pleural effusion. Please perform ultrasound-guided thoracentesis for diagnostic and therapeutic purposes. EXAM: US THORACENTESIS ASP PLEURAL SPACE W/IMG GUIDE COMPARISON:  Chest radiograph-10/05/2018; chest CT-10/05/2018 MEDICATIONS: None. COMPLICATIONS: None immediate. TECHNIQUE: Informed written consent was obtained from the patient after a discussion of the risks, benefits and alternatives to treatment. A timeout was performed prior to the initiation of the procedure. Initial ultrasound scanning demonstrates a moderate sized right-sided anechoic pleural effusion. The lower chest was prepped and draped in the usual sterile fashion. 1% lidocaine was used for local anesthesia. An ultrasound image was saved for documentation purposes. An 8 Fr Safe-T-Centesis catheter was introduced. The thoracentesis was performed. The catheter was removed and a dressing was applied. The patient tolerated the procedure well without immediate post procedural complication. The patient was escorted to have an upright chest radiograph. FINDINGS: A total of approximately 1.2 liters of serous fluid was removed. Requested samples were sent to the laboratory. IMPRESSION: Successful ultrasound-guided right sided thoracentesis yielding 1.2 liters of pleural fluid. Electronically Signed   By: Sandi Mariscal M.D.   On: 10/06/2018 12:19                                ASSESSMENT/PLAN   Shortness of breath due to worsening right pleural effusion -Patient and wife request fluid to be drained to allow them to be discharged home -Patient reports significant improvement in shortness of breath post thoracentesis on previous tap. -will obtain INR and consent for procedure -Currently undergoing work-up with Dr. Mike Gip oncology -Recommend  tunneled pleural catheter for palliation of recurrent malignant effusion     Thank you for allowing me to participate in the care of this patient.     Patient/Family are satisfied with care plan and all questions have been answered.  This document was prepared using Dragon voice recognition software and may include unintentional dictation errors.     Ottie Glazier, M.D.  Division of [facility]

## 2018-10-28 NOTE — H&P (Addendum)
[facility] at [place] NAME: Andres Hebert    MR#:  [id number]  DATE OF BIRTH:  1931-10-17  DATE OF ADMISSION:  10/28/2018  PRIMARY CARE PHYSICIAN: [name], MD   REQUESTING/REFERRING PHYSICIAN: Dr. Jari Pigg  CHIEF COMPLAINT:   Chief Complaint  Patient presents with  . Shortness of Breath  . Abdominal Pain    HISTORY OF PRESENT ILLNESS:  Andres Hebert  is a 85 y.o. male with a known history of attention, hyperlipidemia, diabetes mellitus, recent diagnosis of stage IV right lung adenocarcinoma presents to the emergency room for shortness of breath.  He had a chest x-ray checked by his oncologist yesterday which showed worsening right sided moderate pleural effusion.  He also complains of diffuse wheezing.  Intermittent hemoptysis which has been going on for a few weeks. Patient was admitted a month back and was found to have pleural effusion which was tapped and found to have stage IV lung cancer.  He is supposed to start chemotherapy on 11/02/2018. Patient is being admitted for worsening shortness of breath secondary to acute bronchitis and worsening right pleural effusion. He also complains of diffuse abdominal discomfort.  Has not had a bowel movement in 1 week.  No vomiting.  Passing gas.   PAST MEDICAL HISTORY:   Past Medical History:  Diagnosis Date  . BPV (benign positional vertigo)   . Chronic constipation   . Diabetes mellitus without complication ([place])   . Hyperlipidemia   . Hypertension     PAST SURGICAL HISTORY:  History reviewed. No pertinent surgical history.  SOCIAL HISTORY:   Social History   Tobacco Use  . Smoking status: Former Smoker    Packs/day: 1.00    Years: 25.00    Pack years: 25.00    Types: Cigarettes    Quit date: 1976    Years since quitting: 44.7  . Smokeless tobacco: Never Used  Substance Use Topics  . Alcohol use: Not Currently    FAMILY HISTORY:   Family History  Problem Relation  Age of Onset  . Stroke Mother   . Prostate cancer Father     DRUG ALLERGIES:   Allergies  Allergen Reactions  . Accupril [Quinapril Hcl] Other (See Comments)    Reaction:dizziness per MR    REVIEW OF SYSTEMS:   Review of Systems  Constitutional: Positive for malaise/fatigue. Negative for chills, fever and weight loss.  HENT: Negative for hearing loss and nosebleeds.   Eyes: Negative for blurred vision, double vision and pain.  Respiratory: Positive for cough, hemoptysis, sputum production, shortness of breath and wheezing.   Cardiovascular: Negative for chest pain, palpitations, orthopnea and leg swelling.  Gastrointestinal: Positive for abdominal pain and constipation. Negative for diarrhea, nausea and vomiting.  Genitourinary: Negative for dysuria and hematuria.  Musculoskeletal: Negative for back pain, falls and myalgias.  Skin: Negative for rash.  Neurological: Negative for dizziness, tremors, sensory change, speech change, focal weakness, seizures and headaches.  Endo/Heme/Allergies: Does not bruise/bleed easily.  Psychiatric/Behavioral: Negative for depression and memory loss. The patient is not nervous/anxious.     MEDICATIONS AT HOME:   Prior to Admission medications   Medication Sig Start Date End Date Taking? Authorizing Provider  amLODipine (NORVASC) 5 MG tablet Take 5 mg by mouth daily.    [provider]  citalopram (CELEXA) 10 MG tablet Take 1 tablet by mouth daily. 11/14/17 11/14/18  [provider]  feeding supplement, ENSURE ENLIVE, (ENSURE ENLIVE) LIQD Take 237 mLs by  mouth 3 (three) times daily between meals. 10/08/18   Stark Jock Jude, MD  folic acid (FOLVITE) 1 MG tablet Take 1 tablet (1 mg total) by mouth daily. 10/24/18   Lequita Asal, MD  oxyCODONE-acetaminophen (PERCOCET/ROXICET) 5-325 MG tablet Take 1 tablet by mouth every 4 (four) hours as needed for moderate pain. 10/24/18   Lequita Asal, MD  potassium chloride  (K-DUR,KLOR-CON) 10 MEQ tablet Take 10 mEq by mouth 2 (two) times daily.    [provider]  pravastatin (PRAVACHOL) 40 MG tablet Take 40 mg by mouth daily.    [provider]  senna (SENOKOT) 8.6 MG TABS tablet Take 1 tablet (8.6 mg total) by mouth daily as needed for mild constipation. 10/08/18   Stark Jock Jude, MD  triamterene-hydrochlorothiazide (MAXZIDE-25) 37.5-25 MG tablet Take 1 tablet by mouth daily.    [provider]     VITAL SIGNS:  Blood pressure 127/61, pulse 72, temperature 98.2 F (36.8 C), temperature source Oral, resp. rate 20, height 5\' 6"  (1.676 m), weight 72.6 kg, SpO2 100 %.  PHYSICAL EXAMINATION:  Physical Exam  GENERAL:  85 y.o.-year-old patient lying in the bed with conversational dyspnea EYES: Pupils equal, round, reactive to light and accommodation. No scleral icterus. Extraocular muscles intact.  HEENT: Head atraumatic, normocephalic. Oropharynx and nasopharynx clear. No oropharyngeal erythema, moist oral mucosa  NECK:  Supple, no jugular venous distention. No thyroid enlargement, no tenderness.  LUNGS: Bilateral wheezing.  Decreased air entry on the right side. CARDIOVASCULAR: S1, S2 normal. No murmurs, rubs, or gallops.  ABDOMEN: Soft, nontender, nondistended. Bowel sounds present. No organomegaly or mass.  EXTREMITIES: No pedal edema, cyanosis, or clubbing. + 2 pedal & radial pulses b/l.   NEUROLOGIC: Cranial nerves II through XII are intact. No focal Motor or sensory deficits appreciated b/l PSYCHIATRIC: The patient is alert and oriented x 3.  Anxious SKIN: No obvious rash, lesion, or ulcer.   LABORATORY PANEL:   CBC Recent Labs  Lab 10/28/18 0813  WBC 7.9  HGB 13.5  HCT 41.0  PLT 149*   ------------------------------------------------------------------------------------------------------------------  Chemistries  Recent Labs  Lab 10/28/18 0813  NA 135  K 3.7  CL 93*  CO2 29  GLUCOSE 152*  BUN 21  CREATININE 0.98   CALCIUM 9.6  AST 17  ALT 20  ALKPHOS 61  BILITOT 0.8   ------------------------------------------------------------------------------------------------------------------  Cardiac Enzymes No results for input(s): TROPONINI in the last 168 hours. ------------------------------------------------------------------------------------------------------------------  RADIOLOGY:  Dg Chest 2 View  Result Date: 10/27/2018 CLINICAL DATA:  Right lung malignancy. EXAM: CHEST - 2 VIEW COMPARISON:  Chest radiograph dated 10/24/2018 and CT chest dated 10/05/2018 FINDINGS: A moderate right pleural effusion with associated atelectasis has increased since prior exam. Right lateral nodular pleural thickening is not significantly changed. The left lung is clear. There is no pneumothorax. Emphysematous changes are noted. The cardiomediastinal silhouette is unchanged. Calcifications are seen in the aortic arch. IMPRESSION: Increased moderate right malignant pleural effusion. Aortic Atherosclerosis (ICD10-I70.0) and Emphysema (ICD10-J43.9). Electronically Signed   By: Zerita Boers M.D.   On: 10/27/2018 13:09   Dg Abd 2 Views  Result Date: 10/27/2018 CLINICAL DATA:  Abdominal pain EXAM: ABDOMEN - 2 VIEW COMPARISON:  CT abdomen pelvis dated 09/16/2012. PET/CT dated 10/18/2018. FINDINGS: The bowel gas pattern is nonobstructive. There is no evidence of free air. No radio-opaque calculi or other significant radiographic abnormality is seen. A right pleural effusion with associated mass representing the patient's known malignancy is partially imaged. IMPRESSION: Nonobstructive bowel  gas pattern. Electronically Signed   By: Zerita Boers M.D.   On: 10/27/2018 13:06     IMPRESSION AND PLAN:   *Acute bronchitis with possible underlying COPD.  Patient saturations are 92% on room air and placed on oxygen for comfort.  He is definitely short of breath and has tachypnea.  We will start him on IV Solu-Medrol and  nebulizers.  *Right moderate pleural effusion, malignant.  Thoracentesis done 1 month back and now reaccumulated.  Will consult pulmonary as patient will need outpatient follow-up. We will need to decide between thoracentesis or Pleurx catheter.  *Stage IV adenocarcinoma of the right lung.   Patient scheduled for chemotherapy on 11/02/2018 as outpatient.  *Diabetes mellitus.  Sliding scale insulin ordered.  Patient on IV steroids and blood sugars will likely be uncontrolled.  *Hypertension.  Continue home medications  * Constipation-1 week since last bowel movement Continue senna from home.  Added MiraLAX scheduled twice a day.  *DVT prophylaxis.  SCDs ordered.  No Lovenox secondary to intermittent hemoptysis and also need for thoracentesis or Pleurx catheter  All the records are reviewed and case discussed with ED provider. Management plans discussed with the patient, family and they are in agreement.  CODE STATUS: DNR/DNI  TOTAL TIME TAKING CARE OF THIS PATIENT: 40 minutes.   Leia Alf Lakeyn Dokken M.D on 10/28/2018 at 1:23 PM  Between 7am to 6pm - Pager - [phone]  After 6pm go to [email or web address] - password [email or web address] [facility]  Office  [phone]  CC: Primary care physician; [name], MD  Note: This dictation was prepared with Dragon dictation along with smaller phrase technology. Any transcriptional errors that result from this process are unintentional.

## 2018-10-28 NOTE — ED Triage Notes (Signed)
States was called yesterday due to fluid around R lung. Wife states he has had this before due to lung cancer. (patient having difficulty giving history.) Pain R lower abdomen. Wife states patient has had no BM x 1 week.

## 2018-10-28 NOTE — ED Notes (Signed)
Attempted to call report. Unable to do so at this time.

## 2018-10-28 NOTE — ED Notes (Signed)
Pt ambulatory to bedside commode with RN assistance. Pt grunting with pain in right lower lung and glute but is steady with short steps.

## 2018-10-29 ENCOUNTER — Inpatient Hospital Stay: Payer: Medicare HMO

## 2018-10-29 LAB — CBC
HCT: 36.7 % — ABNORMAL LOW (ref 39.0–52.0)
Hemoglobin: 12.2 g/dL — ABNORMAL LOW (ref 13.0–17.0)
MCH: 30.7 pg (ref 26.0–34.0)
MCHC: 33.2 g/dL (ref 30.0–36.0)
MCV: 92.4 fL (ref 80.0–100.0)
Platelets: 164 10*3/uL (ref 150–400)
RBC: 3.97 MIL/uL — ABNORMAL LOW (ref 4.22–5.81)
RDW: 12.4 % (ref 11.5–15.5)
WBC: 7.4 10*3/uL (ref 4.0–10.5)
nRBC: 0 % (ref 0.0–0.2)

## 2018-10-29 LAB — BASIC METABOLIC PANEL
Anion gap: 10 (ref 5–15)
BUN: 25 mg/dL — ABNORMAL HIGH (ref 8–23)
CO2: 29 mmol/L (ref 22–32)
Calcium: 9.2 mg/dL (ref 8.9–10.3)
Chloride: 94 mmol/L — ABNORMAL LOW (ref 98–111)
Creatinine, Ser: 0.91 mg/dL (ref 0.61–1.24)
GFR calc Af Amer: >60 mL/min (ref >60)
GFR calc non Af Amer: >60 mL/min (ref >60)
Glucose, Bld: 194 mg/dL — ABNORMAL HIGH (ref 70–99)
Potassium: 3.7 mmol/L (ref 3.5–5.1)
Sodium: 133 mmol/L — ABNORMAL LOW (ref 135–145)

## 2018-10-29 LAB — GLUCOSE, CAPILLARY
Glucose-Capillary: 158 mg/dL — ABNORMAL HIGH (ref 70–99)
Glucose-Capillary: 153 mg/dL — ABNORMAL HIGH (ref 70–99)
Glucose-Capillary: 156 mg/dL — ABNORMAL HIGH (ref 70–99)

## 2018-10-29 MED ORDER — LORAZEPAM 2 MG/ML IJ SOLN
1.0000 mg | Freq: Once | INTRAMUSCULAR | Status: AC
  Administered 2018-10-29: 11:00:00 1 mg via INTRAVENOUS
  Filled 2018-10-29: qty 1

## 2018-10-29 MED ORDER — ENSURE MAX PROTEIN PO LIQD
11.0000 [oz_av] | Freq: Two times a day (BID) | ORAL | Status: DC
  Administered 2018-10-29 – 2018-10-30 (×2): 11 [oz_av] via ORAL
  Administered 2018-10-30: 09:00:00 237 mL via ORAL
  Filled 2018-10-29: qty 330

## 2018-10-29 MED ORDER — ALBUTEROL SULFATE (2.5 MG/3ML) 0.083% IN NEBU
2.5000 mg | INHALATION_SOLUTION | RESPIRATORY_TRACT | Status: DC | PRN
  Administered 2018-10-29 – 2018-10-30 (×2): 2.5 mg via RESPIRATORY_TRACT
  Filled 2018-10-29 (×2): qty 3

## 2018-10-29 MED ORDER — IPRATROPIUM-ALBUTEROL 0.5-2.5 (3) MG/3ML IN SOLN
3.0000 mL | Freq: Four times a day (QID) | RESPIRATORY_TRACT | Status: DC
  Administered 2018-10-29: 3 mL via RESPIRATORY_TRACT
  Filled 2018-10-29: qty 3

## 2018-10-29 MED ORDER — IPRATROPIUM-ALBUTEROL 0.5-2.5 (3) MG/3ML IN SOLN
3.0000 mL | Freq: Three times a day (TID) | RESPIRATORY_TRACT | Status: DC
  Administered 2018-10-30 – 2018-10-31 (×4): 3 mL via RESPIRATORY_TRACT
  Filled 2018-10-29 (×6): qty 3

## 2018-10-29 MED ORDER — HALOPERIDOL LACTATE 5 MG/ML IJ SOLN
0.5000 mg | Freq: Two times a day (BID) | INTRAMUSCULAR | Status: DC | PRN
  Administered 2018-10-29: 0.5 mg via INTRAVENOUS
  Filled 2018-10-29: qty 1

## 2018-10-29 NOTE — Progress Notes (Signed)
[facility] at [place] NAME: Andres Hebert    MR#:  [id number]  DATE OF BIRTH:  Jun 20, 1931  SUBJECTIVE:  CHIEF COMPLAINT: Patient with less shortness of breath compared to yesterday.  Wife at bedside.  REVIEW OF SYSTEMS:  CONSTITUTIONAL: No fever, fatigue or weakness.  EYES: No blurred or double vision.  EARS, NOSE, AND THROAT: No tinnitus or ear pain.  RESPIRATORY: No cough, exertional shortness of breath, denies wheezing or hemoptysis.  CARDIOVASCULAR: No chest pain, orthopnea, edema.  GASTROINTESTINAL: No nausea, vomiting, diarrhea or abdominal pain.  GENITOURINARY: No dysuria, hematuria.  ENDOCRINE: No polyuria, nocturia,  HEMATOLOGY: No anemia, easy bruising or bleeding SKIN: No rash or lesion. MUSCULOSKELETAL: No joint pain or arthritis.   NEUROLOGIC: No tingling, numbness, weakness.  PSYCHIATRY: No anxiety or depression.   DRUG ALLERGIES:   Allergies  Allergen Reactions  . Accupril [Quinapril Hcl] Other (See Comments)    Reaction:dizziness per MR    VITALS:  Blood pressure 110/69, pulse 67, temperature 97.6 F (36.4 C), temperature source Oral, resp. rate 18, height 5\' 6"  (1.676 m), weight 72.6 kg, SpO2 92 %.  PHYSICAL EXAMINATION:  GENERAL:  85 y.o.-year-old patient lying in the bed with no acute distress.  EYES: Pupils equal, round, reactive to light and accommodation. No scleral icterus. Extraocular muscles intact.  HEENT: Head atraumatic, normocephalic. Oropharynx and nasopharynx clear.  NECK:  Supple, no jugular venous distention. No thyroid enlargement, no tenderness.  LUNGS: Diminished bronchial breath sounds bilaterally, no wheezing, rales,rhonchi or crepitation. No use of accessory muscles of respiration.  CARDIOVASCULAR: S1, S2 normal. No murmurs, rubs, or gallops.  ABDOMEN: Soft, nontender, nondistended. Bowel sounds present. No organomegaly or mass.  EXTREMITIES: No pedal edema, cyanosis, or clubbing.   NEUROLOGIC: Awake, alert and oriented x3. Sensation intact. Gait not checked.  PSYCHIATRIC: The patient is alert and oriented x 3.  SKIN: No obvious rash, lesion, or ulcer.    LABORATORY PANEL:   CBC Recent Labs  Lab 10/29/18 0416  WBC 7.4  HGB 12.2*  HCT 36.7*  PLT 164   ------------------------------------------------------------------------------------------------------------------  Chemistries  Recent Labs  Lab 10/28/18 0813 10/29/18 0416  NA 135 133*  K 3.7 3.7  CL 93* 94*  CO2 29 29  GLUCOSE 152* 194*  BUN 21 25*  CREATININE 0.98 0.91  CALCIUM 9.6 9.2  AST 17  --   ALT 20  --   ALKPHOS 61  --   BILITOT 0.8  --    ------------------------------------------------------------------------------------------------------------------  Cardiac Enzymes No results for input(s): TROPONINI in the last 168 hours. ------------------------------------------------------------------------------------------------------------------  RADIOLOGY:  No results found.  EKG:   Orders placed or performed during the hospital encounter of 10/28/18  . EKG 12-Lead  . EKG 12-Lead    ASSESSMENT AND PLAN:    *Acute bronchitis with possible underlying COPD.  IV Solu-Medrol and bronchodilator treatment and await clinical improvement Pulmonology is following  *Right moderate pleural effusion, malignant.  Thoracentesis done 1 month back and now reaccumulated.  Pulmonology is following, recommending tunneled pleural catheter for palliation for recurrent malignant effusion we will need to decide between thoracentesis or Pleurx catheter.  PT/INR 14.2/1.1  *Stage IV adenocarcinoma of the right lung.   Patient scheduled for chemotherapy on 11/02/2018 as outpatient. Outpatient follow-up with Dr. Mike Gip as scheduled after discharge  *Diabetes mellitus.  Sliding scale insulin ordered.  Patient on IV steroids and blood sugars will likely be uncontrolled.  *Hypertension.  Continue home  medications  *  Constipation-1 week since last bowel movement Continue senna from home.  Added MiraLAX scheduled twice a day.  *DVT prophylaxis.  SCDs ordered.  No Lovenox secondary to intermittent hemoptysis and also need for thoracentesis or Pleurx catheter    All the records are reviewed and case discussed with Care Management/Social Workerr. Management plans discussed with the patient, family and they are in agreement.  CODE STATUS: DNR  TOTAL TIME TAKING CARE OF THIS PATIENT:35 minutes.   POSSIBLE D/C IN 1-2 DAYS, DEPENDING ON CLINICAL CONDITION.  Note: This dictation was prepared with Dragon dictation along with smaller phrase technology. Any transcriptional errors that result from this process are unintentional.   Nicholes Mango M.D on 10/29/2018 at 1:27 PM  Between 7am to 6pm - Pager - [phone] After 6pm go to [email or web address] - password [email or web address] [facility]  [facility]  Office  [phone]  CC: Primary care physician; [name], MD

## 2018-10-29 NOTE — Progress Notes (Signed)
Initial Nutrition Assessment  DOCUMENTATION CODES:   Not applicable  INTERVENTION:  Will discontinue Ensure Enlive.  Provide Ensure Max Protein po BID, each supplement provides 150 kcal and 30 grams of protein.  NUTRITION DIAGNOSIS:   Increased nutrient needs related to catabolic illness(stage IV adenocarcinoma of right lung) as evidenced by estimated needs.  GOAL:   Patient will meet greater than or equal to 90% of their needs  MONITOR:   PO intake, Supplement acceptance, Labs, Weight trends, I & O's  REASON FOR ASSESSMENT:   Malnutrition Screening Tool    ASSESSMENT:   85 year old male with PMHx of DM, chronic constipation, HTN, HLD, stage IV adenocarcinoma of the right lung admitted with right malignant pleural effusion, acute bronchitis with possible underlying COPD.   Attempted to meet with patient in room for nutrition/weight history, but patient was sleeping soundly. Per chart he ate 100% of his breakfast this morning. He is currently ordered for Ensure Enlive TID. As patient has elevated CBGs will change to Ensure Max protein at this time. Patient is at risk for malnutrition.  Per chart patient was 76.2 kg on 08/22/2018. RD obtained bed scale weight today of 68.7 kg (151.46 lbs). He has lost 7.5 kg (9.8% body weight) over the past ~2 months, which is significant for time frame.  Medications reviewed and include: folic acid 1 mg daily, Novolog 0-15 units TID, Novolog 0-5 units QHS, Solu-Medrol 60 mg BID IV, Miralax, potassium chloride 10 mEq BID, senna 1 tablet BID.  Labs reviewed: CBG 153-209, Sodium 133, Chloride 94, BUN 25.  NUTRITION - FOCUSED PHYSICAL EXAM:  Unable to complete at this time.  Diet Order:   Diet Order            Diet Carb Modified Fluid consistency: Thin; Room service appropriate? Yes  Diet effective now             EDUCATION NEEDS:   No education needs have been identified at this time  Skin:  Skin Assessment: Reviewed RN  Assessment  Last BM:  PTA  Height:   Ht Readings from Last 1 Encounters:  10/28/18 5\' 6"  (1.676 m)   Weight:   Wt Readings from Last 1 Encounters:  10/29/18 68.7 kg   Ideal Body Weight:  64.5 kg  BMI:  Body mass index is 24.45 kg/m.  Estimated Nutritional Needs:   Kcal:  1700-2000  Protein:  85-100 grams  Fluid:  1.7-2 L/day  Willey Blade, MS, RD, LDN Office: [phone] Pager: [phone] After Hours/Weekend Pager: [phone]

## 2018-10-29 NOTE — Progress Notes (Signed)
Pulmonary Medicine          Date: 10/29/2018,   MRN# [id number] Andres Hebert [facility] 31-Jul-1931     AdmissionWeight: 72.6 kg                 CurrentWeight: 68.7 kg(bed scale)      CHIEF COMPLAINT:   Shortness of breath due to right-sided pleural effusion   SUBJECTIVE    Patient was disoriented yesterday and was unable to follow instructions to perform thoracentesis so we canceled it.  Today during evaluation he was very unsteady on his feet and appeared confused, this was discussed with wife at the bedside.  I recommend Pleurx catheter for palliation of malignant effusion.  This was discussed with wife today and she is agreeable, I have asked her to discuss this with Dr. Mike Gip as well.  PAST MEDICAL HISTORY   Past Medical History:  Diagnosis Date   BPV (benign positional vertigo)    Chronic constipation    Diabetes mellitus without complication ([place])    Hyperlipidemia    Hypertension      SURGICAL HISTORY   History reviewed. No pertinent surgical history.   FAMILY HISTORY   Family History  Problem Relation Age of Onset   Stroke Mother    Prostate cancer Father      SOCIAL HISTORY   Social History   Tobacco Use   Smoking status: Former Smoker    Packs/day: 1.00    Years: 25.00    Pack years: 25.00    Types: Cigarettes    Quit date: 1976    Years since quitting: 44.7   Smokeless tobacco: Never Used  Substance Use Topics   Alcohol use: Not Currently   Drug use: Never     MEDICATIONS    Home Medication:    Current Medication:  Current Facility-Administered Medications:    acetaminophen (TYLENOL) tablet 650 mg, 650 mg, Oral, Q6H PRN **OR** acetaminophen (TYLENOL) suppository 650 mg, 650 mg, Rectal, Q6H PRN, Sudini, Srikar, MD   albuterol (PROVENTIL) (2.5 MG/3ML) 0.083% nebulizer solution 2.5 mg, 2.5 mg, Nebulization, Q4H PRN, Gouru, Aruna, MD, 2.5 mg at 10/29/18 2119   amLODipine (NORVASC) tablet 5 mg, 5 mg, Oral,  Daily, Sudini, Srikar, MD   bisacodyl (DULCOLAX) suppository 10 mg, 10 mg, Rectal, Daily PRN, Hillary Bow, MD, 10 mg at 10/29/18 1108   citalopram (CELEXA) tablet 10 mg, 10 mg, Oral, Daily, Sudini, Srikar, MD, 10 mg at 41/74/08 1448   folic acid (FOLVITE) tablet 1 mg, 1 mg, Oral, Daily, Sudini, Srikar, MD, 1 mg at 10/29/18 0834   haloperidol lactate (HALDOL) injection 0.5 mg, 0.5 mg, Intravenous, Q12H PRN, Gouru, Aruna, MD, 0.5 mg at 10/29/18 1147   insulin aspart (novoLOG) injection 0-15 Units, 0-15 Units, Subcutaneous, TID WC, Sudini, Srikar, MD, 3 Units at 10/29/18 1741   insulin aspart (novoLOG) injection 0-5 Units, 0-5 Units, Subcutaneous, QHS, Sudini, Srikar, MD, 2 Units at 10/28/18 2144   ipratropium-albuterol (DUONEB) 0.5-2.5 (3) MG/3ML nebulizer solution 3 mL, 3 mL, Nebulization, TID, Lanney Gins, Xaviera Flaten, MD   magnesium hydroxide (MILK OF MAGNESIA) suspension 30 mL, 30 mL, Oral, Daily PRN, Sudini, Srikar, MD, 30 mL at 10/29/18 0833   methylPREDNISolone sodium succinate (SOLU-MEDROL) 125 mg/2 mL injection 60 mg, 60 mg, Intravenous, BID, Sudini, Srikar, MD, 60 mg at 10/29/18 1103   metoprolol tartrate (LOPRESSOR) tablet 25 mg, 25 mg, Oral, BID, Sudini, Srikar, MD, 25 mg at 10/29/18 0833   ondansetron (ZOFRAN) tablet 4 mg, 4 mg,  Oral, Q6H PRN **OR** ondansetron (ZOFRAN) injection 4 mg, 4 mg, Intravenous, Q6H PRN, Sudini, Srikar, MD   oxyCODONE-acetaminophen (PERCOCET) 7.5-325 MG per tablet 1 tablet, 1 tablet, Oral, Q4H PRN, Hillary Bow, MD, 1 tablet at 10/29/18 0224   polyethylene glycol (MIRALAX / GLYCOLAX) packet 17 g, 17 g, Oral, BID, Sudini, Srikar, MD, 17 g at 10/29/18 5188   potassium chloride SA (KLOR-CON) CR tablet 10 mEq, 10 mEq, Oral, BID, Sudini, Srikar, MD, 10 mEq at 10/29/18 4166   pravastatin (PRAVACHOL) tablet 40 mg, 40 mg, Oral, Daily, Sudini, Srikar, MD, 40 mg at 10/29/18 1731   protein supplement (ENSURE MAX) liquid, 11 oz, Oral, BID BM, Gouru, Aruna, MD, 11  oz at 10/29/18 1730   senna (SENOKOT) tablet 8.6 mg, 1 tablet, Oral, BID, Sudini, Srikar, MD, 8.6 mg at 10/29/18 0630   triamterene-hydrochlorothiazide (MAXZIDE-25) 37.5-25 MG per tablet 1 tablet, 1 tablet, Oral, Daily, Sudini, Srikar, MD    ALLERGIES   Accupril [quinapril hcl]     REVIEW OF SYSTEMS    Review of Systems:  Gen:  Denies  fever, sweats, chills weigh loss  HEENT: Denies blurred vision, double vision, ear pain, eye pain, hearing loss, nose bleeds, sore throat Cardiac:  No dizziness, chest pain or heaviness, chest tightness,edema Resp:   Denies cough or sputum porduction, shortness of breath,wheezing, hemoptysis,  Gi: Denies swallowing difficulty, stomach pain, nausea or vomiting, diarrhea, constipation, bowel incontinence Gu:  Denies bladder incontinence, burning urine Ext:   Denies Joint pain, stiffness or swelling Skin: Denies  skin rash, easy bruising or bleeding or hives Endoc:  Denies polyuria, polydipsia , polyphagia or weight change Psych:   Denies depression, insomnia or hallucinations   Other:  All other systems negative   VS: BP 110/69 (BP Location: Right Arm)    Pulse 67    Temp 97.6 F (36.4 C) (Oral)    Resp 18    Ht 5\' 6"  (1.676 m)    Wt 68.7 kg Comment: bed scale   SpO2 92%    BMI 24.45 kg/m      PHYSICAL EXAM    GENERAL:NAD, no fevers, chills, no weakness no fatigue HEAD: Normocephalic, atraumatic.  EYES: Pupils equal, round, reactive to light. Extraocular muscles intact. No scleral icterus.  MOUTH: Moist mucosal membrane. Dentition intact. No abscess noted.  EAR, NOSE, THROAT: Clear without exudates. No external lesions.  NECK: Supple. No thyromegaly. No nodules. No JVD.  PULMONARY: Decreased breath sounds on right CARDIOVASCULAR: S1 and S2. Regular rate and rhythm. No murmurs, rubs, or gallops. No edema. Pedal pulses 2+ bilaterally.  GASTROINTESTINAL: Soft, nontender, nondistended. No masses. Positive bowel sounds. No  hepatosplenomegaly.  MUSCULOSKELETAL: No swelling, clubbing, or edema. Range of motion full in all extremities.  NEUROLOGIC: Cranial nerves II through XII are intact. No gross focal neurological deficits. Sensation intact. Reflexes intact.  SKIN: No ulceration, lesions, rashes, or cyanosis. Skin warm and dry. Turgor intact.  PSYCHIATRIC: Mood, affect within normal limits. The patient is awake, alert and oriented x 3. Insight, judgment intact.       IMAGING    Ct Angio Head W Or Wo Contrast  Result Date: 10/08/2018 CLINICAL DATA:  Lung cancer. Balance disturbance. Abnormal flow left vertebral artery seen by MRI. EXAM: CT ANGIOGRAPHY HEAD AND NECK TECHNIQUE: Multidetector CT imaging of the head and neck was performed using the standard protocol during bolus administration of intravenous contrast. Multiplanar CT image reconstructions and MIPs were obtained to evaluate the vascular anatomy. Carotid stenosis measurements (  when applicable) are obtained utilizing NASCET criteria, using the distal internal carotid diameter as the denominator. CONTRAST:  68mL OMNIPAQUE IOHEXOL 350 MG/ML SOLN COMPARISON:  MRI 10/07/2018 FINDINGS: CT HEAD FINDINGS Brain: Mild age related volume loss. Mild chronic appearing small vessel change of the hemispheric white matter. No sign of acute infarction, mass lesion, hemorrhage, hydrocephalus or extra-axial collection. Vascular: Negative noncontrast appearance. Skull: Normal Sinuses: Clear Orbits: Normal Review of the MIP images confirms the above findings CTA NECK FINDINGS Aortic arch: Aortic atherosclerosis. No aneurysm or dissection. Branching pattern shows the congenital variation of a small left vertebral artery arising from the arch. Right carotid system: Common carotid artery widely patent to the bifurcation. Ordinary atherosclerotic plaque at the carotid bifurcation and ICA bulb but no stenosis. Cervical ICA widely patent. Left carotid system: Common carotid artery widely  patent to the bifurcation. Ordinary calcified plaque at the carotid bifurcation and ICA bulb but no stenosis. Cervical ICA widely patent. Vertebral arteries: Dominant left vertebral artery is widely patent at its origin and through the cervical region to the foramen magnum. Non dominant small left vertebral artery arises directly from the arch and is patent through the cervical region to the foramen magnum. Skeleton: Ordinary cervical spondylosis. Other neck: No mass or lymphadenopathy. Upper chest: See results recent chest evaluations. Scarring and emphysema at the lung apices. Layering pleural fluid on the right. Review of the MIP images confirms the above findings CTA HEAD FINDINGS Anterior circulation: Both internal carotid arteries are patent through the skull base and siphon regions. No siphon stenosis. Anterior and middle cerebral vessels are patent without proximal stenosis, aneurysm or vascular malformation. Posterior circulation: Dominant right vertebral artery is widely patent through the foramen magnum to the basilar. Congenital small left vertebral artery is patent through the foramen magnum, supplying left PICA, with a very rudimentary connection to the basilar. No basilar stenosis. Superior cerebellar and posterior cerebral arteries show flow. No stenosis or aneurysm. Venous sinuses: Patent and normal. Anatomic variants: None other significant. Review of the MIP images confirms the above findings IMPRESSION: Ordinary aortic atherosclerosis. Ordinary atherosclerosis at the carotid bifurcations without stenosis. No intracranial anterior circulation finding of significance. Patient has the congenital variation of asymmetric vertebral anatomy. Dominant normal appearing right vertebral artery widely patent to the basilar. Tiny left vertebral artery arising directly from the arch, but patent through the neck, supplying left PICA and with a rudimentary contribution to the basilar. This is not likely to be of  any clinical relevance. Electronically Signed   By: Nelson Chimes M.D.   On: 10/08/2018 10:22   Dg Chest 2 View  Result Date: 10/27/2018 CLINICAL DATA:  Right lung malignancy. EXAM: CHEST - 2 VIEW COMPARISON:  Chest radiograph dated 10/24/2018 and CT chest dated 10/05/2018 FINDINGS: A moderate right pleural effusion with associated atelectasis has increased since prior exam. Right lateral nodular pleural thickening is not significantly changed. The left lung is clear. There is no pneumothorax. Emphysematous changes are noted. The cardiomediastinal silhouette is unchanged. Calcifications are seen in the aortic arch. IMPRESSION: Increased moderate right malignant pleural effusion. Aortic Atherosclerosis (ICD10-I70.0) and Emphysema (ICD10-J43.9). Electronically Signed   By: Zerita Boers M.D.   On: 10/27/2018 13:09   Dg Chest 2 View  Result Date: 10/24/2018 CLINICAL DATA:  Assess pleural fluid EXAM: CHEST - 2 VIEW COMPARISON:  10/06/2018, CT chest, 10/05/2018 FINDINGS: Interval increase in a right-sided pleural effusion, now moderate, with associated atelectasis or consolidation and multiple pleural based nodules, better characterized by prior  CT. Emphysema. The heart mediastinum are unremarkable. Disc degenerative disease of the thoracic spine IMPRESSION: 1. Interval increase in a right-sided pleural effusion, now moderate, with associated atelectasis or consolidation and multiple pleural based nodules, better characterized by prior CT. 2.  Emphysema. Electronically Signed   By: Eddie Candle M.D.   On: 10/24/2018 10:53   Dg Chest 2 View  Result Date: 10/05/2018 CLINICAL DATA:  Shortness of breath EXAM: CHEST - 2 VIEW COMPARISON:  None. FINDINGS: Small right pleural effusion with lateral loculated component versus pleural nodularity. The lungs are hyperexpanded with increased interstitial markings. Heart size is normal. There is calcific aortic atherosclerosis. IMPRESSION: Small right pleural effusion with  lateral loculated component versus pleural nodularity. In the setting of recent trauma, this may indicate nondisplaced or minimally displaced rib fractures with a small hemothorax. CT may be helpful for further investigation. Electronically Signed   By: Ulyses Jarred M.D.   On: 10/05/2018 19:10   Ct Angio Neck W Or Wo Contrast  Result Date: 10/08/2018 CLINICAL DATA:  Lung cancer. Balance disturbance. Abnormal flow left vertebral artery seen by MRI. EXAM: CT ANGIOGRAPHY HEAD AND NECK TECHNIQUE: Multidetector CT imaging of the head and neck was performed using the standard protocol during bolus administration of intravenous contrast. Multiplanar CT image reconstructions and MIPs were obtained to evaluate the vascular anatomy. Carotid stenosis measurements (when applicable) are obtained utilizing NASCET criteria, using the distal internal carotid diameter as the denominator. CONTRAST:  30mL OMNIPAQUE IOHEXOL 350 MG/ML SOLN COMPARISON:  MRI 10/07/2018 FINDINGS: CT HEAD FINDINGS Brain: Mild age related volume loss. Mild chronic appearing small vessel change of the hemispheric white matter. No sign of acute infarction, mass lesion, hemorrhage, hydrocephalus or extra-axial collection. Vascular: Negative noncontrast appearance. Skull: Normal Sinuses: Clear Orbits: Normal Review of the MIP images confirms the above findings CTA NECK FINDINGS Aortic arch: Aortic atherosclerosis. No aneurysm or dissection. Branching pattern shows the congenital variation of a small left vertebral artery arising from the arch. Right carotid system: Common carotid artery widely patent to the bifurcation. Ordinary atherosclerotic plaque at the carotid bifurcation and ICA bulb but no stenosis. Cervical ICA widely patent. Left carotid system: Common carotid artery widely patent to the bifurcation. Ordinary calcified plaque at the carotid bifurcation and ICA bulb but no stenosis. Cervical ICA widely patent. Vertebral arteries: Dominant left  vertebral artery is widely patent at its origin and through the cervical region to the foramen magnum. Non dominant small left vertebral artery arises directly from the arch and is patent through the cervical region to the foramen magnum. Skeleton: Ordinary cervical spondylosis. Other neck: No mass or lymphadenopathy. Upper chest: See results recent chest evaluations. Scarring and emphysema at the lung apices. Layering pleural fluid on the right. Review of the MIP images confirms the above findings CTA HEAD FINDINGS Anterior circulation: Both internal carotid arteries are patent through the skull base and siphon regions. No siphon stenosis. Anterior and middle cerebral vessels are patent without proximal stenosis, aneurysm or vascular malformation. Posterior circulation: Dominant right vertebral artery is widely patent through the foramen magnum to the basilar. Congenital small left vertebral artery is patent through the foramen magnum, supplying left PICA, with a very rudimentary connection to the basilar. No basilar stenosis. Superior cerebellar and posterior cerebral arteries show flow. No stenosis or aneurysm. Venous sinuses: Patent and normal. Anatomic variants: None other significant. Review of the MIP images confirms the above findings IMPRESSION: Ordinary aortic atherosclerosis. Ordinary atherosclerosis at the carotid bifurcations without stenosis. No  intracranial anterior circulation finding of significance. Patient has the congenital variation of asymmetric vertebral anatomy. Dominant normal appearing right vertebral artery widely patent to the basilar. Tiny left vertebral artery arising directly from the arch, but patent through the neck, supplying left PICA and with a rudimentary contribution to the basilar. This is not likely to be of any clinical relevance. Electronically Signed   By: Nelson Chimes M.D.   On: 10/08/2018 10:22   Ct Angio Chest Pe W And/or Wo Contrast  Result Date: 10/05/2018 CLINICAL  DATA:  Chest pain EXAM: CT ANGIOGRAPHY CHEST WITH CONTRAST TECHNIQUE: Multidetector CT imaging of the chest was performed using the standard protocol during bolus administration of intravenous contrast. Multiplanar CT image reconstructions and MIPs were obtained to evaluate the vascular anatomy. CONTRAST:  53mL OMNIPAQUE IOHEXOL 350 MG/ML SOLN COMPARISON:  Chest x-ray today FINDINGS: Cardiovascular: No filling defects in the pulmonary arteries to suggest pulmonary emboli. Heart is normal size. Aorta is normal caliber. Coronary artery and aortic atherosclerosis. Mediastinum/Nodes: Enlarged right hilar lymph nodes with index node having a short axis diameter of 10 mm. No mediastinal, left hilar or axillary adenopathy. Lungs/Pleura: There is concern for right lower lobe mass measuring approximately 8.4 x 6.9 cm. Associated large right joint effusion with pleural nodules concerning for metastatic pleural involvement. Compressive atelectasis in the right lower lobe. No focal opacity on the left. Moderate to advanced emphysema. Upper Abdomen: Imaging into the upper abdomen shows no acute findings. Musculoskeletal: Destructive mass noted in the lateral right 7th rib. Bone destruction also in the lateral right 8th rib. Diffuse degenerative changes in the thoracic spine. Review of the MIP images confirms the above findings. IMPRESSION: Large right lower lobe mass measures up to 8.4 cm most compatible with lung cancer. This is associated with numerous pleural nodules, large right pleural effusion compatible with pleural metastatic disease. There also bony destructive lytic lesions in the right lateral 7th and 8th ribs. Moderate to advanced emphysema. No evidence of pulmonary embolus. Aortic Atherosclerosis (ICD10-I70.0) and Emphysema (ICD10-J43.9). Electronically Signed   By: Rolm Baptise M.D.   On: 10/05/2018 21:10   Mr Jeri Cos NT Contrast  Result Date: 10/07/2018 CLINICAL DATA:  85 year old male with large right lung  mass. Loss of balance over the past 3-4 months. Staging. EXAM: MRI HEAD WITHOUT AND WITH CONTRAST TECHNIQUE: Multiplanar, multiecho pulse sequences of the brain and surrounding structures were obtained without and with intravenous contrast. CONTRAST:  48mL GADAVIST GADOBUTROL 1 MMOL/ML IV SOLN COMPARISON:  None. FINDINGS: Brain: Mild motion artifact on post-contrast images. No abnormal enhancement identified. No midline shift, mass effect, or evidence of intracranial mass lesion. Cerebral volume is within normal limits for age. No restricted diffusion to suggest acute infarction. No ventriculomegaly, extra-axial collection or acute intracranial hemorrhage. Cervicomedullary junction and pituitary are within normal limits. Minimal to mild for age scattered nonspecific cerebral white matter T2 and FLAIR hyperintensity. No cortical encephalomalacia or chronic cerebral blood products. There is a possible small chronic subcortical white matter lacune in the left anterior frontal lobe on series 10, image 15. Vascular: Loss of the distal left vertebral artery flow void at the skull base although there does appear to be some left V4 segment collateral flow. Other Major intracranial vascular flow voids are preserved. The major dural venous sinuses are enhancing and appear to be patent. Skull and upper cervical spine: Negative visible cervical spine and spinal cord. Visible bone marrow signal is normal. Sinuses/Orbits: Postoperative changes to the globes, otherwise negative orbits.  Paranasal sinuses are clear. Other: Mastoids are clear. Visible internal auditory structures appear normal. Scalp and face soft tissues appear negative. IMPRESSION: 1. No metastatic disease or acute intracranial abnormality identified. 2. Distal left vertebral artery with poor flow or occlusion, most likely due to atherosclerosis. CTA or MRA of the neck and head could evaluate further as necessary. Electronically Signed   By: Genevie Ann M.D.   On:  10/07/2018 16:50   Nm Pet Image Initial (pi) Skull Base To Thigh  Result Date: 10/18/2018 CLINICAL DATA:  Initial treatment strategy for lung adenocarcinoma. RIGHT lower lobe mass. EXAM: NUCLEAR MEDICINE PET SKULL BASE TO THIGH TECHNIQUE: 8.0 mCi F-18 FDG was injected intravenously. Full-ring PET imaging was performed from the skull base to thigh after the radiotracer. CT data was obtained and used for attenuation correction and anatomic localization. Fasting blood glucose: 150 mg/dl COMPARISON:  Chest CT October 05, 2018 FINDINGS: Mediastinal blood pool activity: SUV max 2.64 Liver activity: SUV max NA NECK: No hypermetabolic cervical lymph nodes. Incidental CT findings: none Chest: Hypermetabolic mass in the RIGHT lower lobe measures 7.5 x 5.7 cm with intense metabolic activity (SUV max equal 17.8). There is hypermetabolic pleural thickening in the RIGHT upper lobe and RIGHT lower lobe. For example, RIGHT upper lobe pleural nodule measuring 2.9 x 1.4 cm witth SUV max equal 7.7. There is a moderate RIGHT pleural effusion. Within the fusion is a hypermetabolic pleural nodule SUV max equal 4.9. Approximately 12 hypermetabolic pleural nodules in the RIGHT lung. One of these pleural nodules invades the cortex of the adjacent 7th rib (image 218 of the fused data set). Hypermetabolic RIGHT hilar lymph node. No central hypermetabolic mediastinal lymph nodes. No hypermetabolic supraclavicular adenopathy. Incidental CT findings: none ABDOMEN/PELVIS: Adjacent to the RIGHT adrenal gland is hypermetabolic nodular thickening along the diaphragm. Adrenal glands are normal. No abnormal activity within the liver. No hypermetabolic upper abdominal lymph nodes. No hypermetabolic pelvic lymph nodes. Incidental CT findings: Prostate enlarged. Atherosclerotic calcification of the aorta. SKELETON: Cortical invasion of the RIGHT seventh rib as described in the chest section no additional evidence skeletal metastasis Incidental CT  findings: none IMPRESSION: 1. Large hypermetabolic RIGHT lower lobe mass consistent bronchogenic carcinoma. 2. Multifocal pleural metastasis within the RIGHT hemithorax. 3. One pleural metastatic lesion invades adjacent RIGHT seventh rib. 4. Moderate RIGHT effusion. 5. Probable RIGHT hilar metastatic adenopathy. No mediastinal or supraclavicular metastatic adenopathy. Electronically Signed   By: Suzy Bouchard M.D.   On: 10/18/2018 12:28   Dg Chest Port 1 View  Result Date: 10/06/2018 CLINICAL DATA:  Post right-sided thoracentesis EXAM: PORTABLE CHEST 1 VIEW COMPARISON:  Chest radiograph-10/05/2018; chest CT-10/05/2018 FINDINGS: Grossly unchanged cardiac silhouette and mediastinal contours with atherosclerotic plaque within the thoracic aorta. Interval reduction/near resolution of right-sided pleural effusion post thoracentesis. No pneumothorax. Improved aeration of the right lung base with persistent masslike consolidative opacities and partial atelectasis/collapse. There is persistent pleuroparenchymal thickening about the peripheral aspect of the right mid lung. The left hemithorax remains well aerated. Similar findings of lung hyperexpansion. No evidence of edema. No acute osseous abnormalities. Degenerative change the right glenohumeral joint. IMPRESSION: 1. Interval reduction/near resolution of right-sided pleural effusion post thoracentesis. No pneumothorax. 2. Improved aeration of the right lower lung with persistent masslike consolidative opacities and partial atelectasis/collapse. 3. Grossly unchanged pleuroparenchymal thickening along the right lateral chest wall compatible with suspected pleural metastasis demonstrated on preceding chest CT. 4. Aortic Atherosclerosis (ICD10-I70.0) and Emphysema (ICD10-J43.9). Electronically Signed   By: Eldridge Abrahams.D.  On: 10/06/2018 10:04   Dg Abd 2 Views  Result Date: 10/27/2018 CLINICAL DATA:  Abdominal pain EXAM: ABDOMEN - 2 VIEW COMPARISON:  CT abdomen  pelvis dated 09/16/2012. PET/CT dated 10/18/2018. FINDINGS: The bowel gas pattern is nonobstructive. There is no evidence of free air. No radio-opaque calculi or other significant radiographic abnormality is seen. A right pleural effusion with associated mass representing the patient's known malignancy is partially imaged. IMPRESSION: Nonobstructive bowel gas pattern. Electronically Signed   By: Zerita Boers M.D.   On: 10/27/2018 13:06   US Thoracentesis Asp Pleural Space W/img Guide  Result Date: 10/06/2018 INDICATION: No known primary, now with concern for metastatic lung cancer with symptomatic right-sided pleural effusion. Please perform ultrasound-guided thoracentesis for diagnostic and therapeutic purposes. EXAM: US THORACENTESIS ASP PLEURAL SPACE W/IMG GUIDE COMPARISON:  Chest radiograph-10/05/2018; chest CT-10/05/2018 MEDICATIONS: None. COMPLICATIONS: None immediate. TECHNIQUE: Informed written consent was obtained from the patient after a discussion of the risks, benefits and alternatives to treatment. A timeout was performed prior to the initiation of the procedure. Initial ultrasound scanning demonstrates a moderate sized right-sided anechoic pleural effusion. The lower chest was prepped and draped in the usual sterile fashion. 1% lidocaine was used for local anesthesia. An ultrasound image was saved for documentation purposes. An 8 Fr Safe-T-Centesis catheter was introduced. The thoracentesis was performed. The catheter was removed and a dressing was applied. The patient tolerated the procedure well without immediate post procedural complication. The patient was escorted to have an upright chest radiograph. FINDINGS: A total of approximately 1.2 liters of serous fluid was removed. Requested samples were sent to the laboratory. IMPRESSION: Successful ultrasound-guided right sided thoracentesis yielding 1.2 liters of pleural fluid. Electronically Signed   By: Sandi Mariscal M.D.   On: 10/06/2018 12:19                                 ASSESSMENT/PLAN   Shortness of breath due to worsening right pleural effusion -Discussed current findings with patient and wife at bedside today. -Patient reports significant improvement in shortness of breath post thoracentesis on previous tap. -will obtain INR and consent for procedure -Currently undergoing work-up with Dr. Mike Gip oncology -Recommend tunneled pleural catheter for palliation of recurrent malignant effusion     Thank you for allowing me to participate in the care of this patient.     Patient/Family are satisfied with care plan and all questions have been answered.  This document was prepared using Dragon voice recognition software and may include unintentional dictation errors.     Ottie Glazier, M.D.  Division of [facility]

## 2018-10-30 ENCOUNTER — Inpatient Hospital Stay: Payer: Medicare HMO

## 2018-10-30 DIAGNOSIS — C801 Malignant (primary) neoplasm, unspecified: Secondary | ICD-10-CM

## 2018-10-30 DIAGNOSIS — Z515 Encounter for palliative care: Secondary | ICD-10-CM

## 2018-10-30 LAB — GLUCOSE, CAPILLARY
Glucose-Capillary: 183 mg/dL — ABNORMAL HIGH (ref 70–99)
Glucose-Capillary: 203 mg/dL — ABNORMAL HIGH (ref 70–99)
Glucose-Capillary: 180 mg/dL — ABNORMAL HIGH (ref 70–99)
Glucose-Capillary: 139 mg/dL — ABNORMAL HIGH (ref 70–99)
Glucose-Capillary: 236 mg/dL — ABNORMAL HIGH (ref 70–99)

## 2018-10-30 NOTE — Progress Notes (Signed)
[facility] NAME: Emmanuelle Coxe    MR#:  [id number]  DATE OF BIRTH:  1931-08-08  SUBJECTIVE:  CHIEF COMPLAINT: Patient with shortness of breath with  Min exertion and hemoptysis intermittently.  Patient is pleasantly confused, and according to the wife patient has baseline delirium even at home, waiting for neurology consult outpatient as they were worried about dementia wife at bedside, agreeable with palliative care consult  REVIEW OF SYSTEMS:  CONSTITUTIONAL: No fever, fatigue or weakness.  EYES: No blurred or double vision.  EARS, NOSE, AND THROAT: No tinnitus or ear pain.  RESPIRATORY: No cough, exertional shortness of breath, denies wheezing or hemoptysis.  CARDIOVASCULAR: No chest pain, orthopnea, edema.  GASTROINTESTINAL: No nausea, vomiting, diarrhea or abdominal pain.  GENITOURINARY: No dysuria, hematuria.  ENDOCRINE: No polyuria, nocturia,  HEMATOLOGY: No anemia, easy bruising or bleeding SKIN: No rash or lesion. MUSCULOSKELETAL: No joint pain or arthritis.   NEUROLOGIC: No tingling, numbness, weakness.  PSYCHIATRY: No anxiety or depression.   DRUG ALLERGIES:   Allergies  Allergen Reactions  . Accupril [Quinapril Hcl] Other (See Comments)    Reaction:dizziness per MR    VITALS:  Blood pressure 131/66, pulse 72, temperature 97.7 F (36.5 C), temperature source Oral, resp. rate 18, height 5\' 6"  (1.676 m), weight 68.7 kg, SpO2 93 %.  PHYSICAL EXAMINATION:  GENERAL:  85 y.o.-year-old patient lying in the bed with no acute distress.  EYES: Pupils equal, round, reactive to light and accommodation. No scleral icterus. Extraocular muscles intact.  HEENT: Head atraumatic, normocephalic. Oropharynx and nasopharynx clear.  NECK:  Supple, no jugular venous distention. No thyroid enlargement, no tenderness.  LUNGS: Diminished bronchial breath sounds bilaterally, no wheezing, rales,rhonchi or crepitation. No use of  accessory muscles of respiration.  CARDIOVASCULAR: S1, S2 normal. No murmurs, rubs, or gallops.  ABDOMEN: Soft, nontender, nondistended. Bowel sounds present. No organomegaly or mass.  EXTREMITIES: No pedal edema, cyanosis, or clubbing.  NEUROLOGIC: Awake, alert and oriented x3. Sensation intact. Gait not checked.  PSYCHIATRIC: The patient is alert and oriented x 3.  SKIN: No obvious rash, lesion, or ulcer.    LABORATORY PANEL:   CBC Recent Labs  Lab 10/29/18 0416  WBC 7.4  HGB 12.2*  HCT 36.7*  PLT 164   ------------------------------------------------------------------------------------------------------------------  Chemistries  Recent Labs  Lab 10/28/18 0813 10/29/18 0416  NA 135 133*  K 3.7 3.7  CL 93* 94*  CO2 29 29  GLUCOSE 152* 194*  BUN 21 25*  CREATININE 0.98 0.91  CALCIUM 9.6 9.2  AST 17  --   ALT 20  --   ALKPHOS 61  --   BILITOT 0.8  --    ------------------------------------------------------------------------------------------------------------------  Cardiac Enzymes No results for input(s): TROPONINI in the last 168 hours. ------------------------------------------------------------------------------------------------------------------  RADIOLOGY:  Dg Chest Port 1 View  Result Date: 10/29/2018 CLINICAL DATA:  85 year old male with history of shortness of breath. Stage IV lung cancer. Follow-up study. EXAM: PORTABLE CHEST 1 VIEW COMPARISON:  Chest x-ray 10/27/2018. FINDINGS: Opacity at the right base compatible with known mass. There is adjacent atelectasis and/or consolidation, as well as a moderate right pleural effusion. Irregularity along the lateral aspect of the right hemithorax compatible with loculated areas of pleural fluid and/or pleural nodularity, better demonstrated on prior PET-CT 10/18/2018. Left lung is clear. No left pleural effusion. No evidence of pulmonary edema. Heart size is normal. Upper mediastinal contours are within normal  limits. Aortic atherosclerosis. IMPRESSION: 1. Allowing  for differences in patient positioning, the radiographic appearance the chest is very similar to the prior study, as above. Electronically Signed   By: Vinnie Langton M.D.   On: 10/29/2018 18:50    EKG:   Orders placed or performed during the hospital encounter of 10/28/18  . EKG 12-Lead  . EKG 12-Lead    ASSESSMENT AND PLAN:    *Acute bronchitis with possible underlying COPD.  IV Solu-Medrol and bronchodilator treatment and await clinical improvement Pulmonology is following  *Right moderate pleural effusion, malignant.  Thoracentesis done 1 month back and now reaccumulated.  Pulmonology is following, recommending tunneled pleural catheter for palliation for recurrent malignant effusion we will need to decide between thoracentesis or Pleurx catheter.  Call placed to interventional radiology-[phone] and awaiting radiologist to call back  PT/INR 14.2/1.1  *Stage IV adenocarcinoma of the right lung.   Patient scheduled for chemotherapy on 11/02/2018 as outpatient. Outpatient follow-up with Dr. Mike Gip as scheduled after discharge Palliative care consulted and Josh PA notified, he is aware of the consult  *Diabetes mellitus.  Sliding scale insulin ordered.  Patient on IV steroids and blood sugars will likely be uncontrolled.  *Hypertension.  Continue home medications  *Adult failure to thrive Palliative care consult  * Constipation- Continue senna from home.  Added MiraLAX scheduled twice a day.  *DVT prophylaxis.  SCDs ordered.  No Lovenox secondary to intermittent hemoptysis and also need for thoracentesis or Pleurx catheter    All the records are reviewed and case discussed with Care Management/Social Workerr. Management plans discussed with the patient, family and they are in agreement.  CODE STATUS: DNR  TOTAL TIME TAKING CARE OF THIS PATIENT:35 minutes.   POSSIBLE D/C IN 1-2 DAYS, DEPENDING ON  CLINICAL CONDITION.  Note: This dictation was prepared with Dragon dictation along with smaller phrase technology. Any transcriptional errors that result from this process are unintentional.   Nicholes Mango M.D on 10/30/2018 at 11:23 AM  Between 7am to 6pm - Pager - [phone] After 6pm go to [email or web address] - password [email or web address] [facility]  [facility]  Office  [phone]  CC: Primary care physician; [name], MD

## 2018-10-30 NOTE — Progress Notes (Signed)
Pulmonary Medicine          Date: 10/30/2018,   MRN# [id number] Andres Hebert Andres Hebert 06-23-1931     AdmissionWeight: 72.6 kg                 CurrentWeight: 68.7 kg(bed scale)      CHIEF COMPLAINT:   Shortness of breath due to right-sided pleural effusion   SUBJECTIVE    Patient is s/p thoracentesis.  He reports clinical improvement in terms of SOB, currently on 2-3L/min O2.  He did have scant "pinkish" expectorate today.  There is plan for Pallliative consult today.    From pulmonary perspective patient is overall weak and chronically ill appearing.  He is cleared for d/c from pulm perspective.  PleurX may be done on outpatient if consensus is to proceed with placement.   PAST MEDICAL HISTORY   Past Medical History:  Diagnosis Date   BPV (benign positional vertigo)    Chronic constipation    Diabetes mellitus without complication ([place])    Hyperlipidemia    Hypertension      SURGICAL HISTORY   History reviewed. No pertinent surgical history.   FAMILY HISTORY   Family History  Problem Relation Age of Onset   Stroke Mother    Prostate cancer Father      SOCIAL HISTORY   Social History   Tobacco Use   Smoking status: Former Smoker    Packs/day: 1.00    Years: 25.00    Pack years: 25.00    Types: Cigarettes    Quit date: 1976    Years since quitting: 44.7   Smokeless tobacco: Never Used  Substance Use Topics   Alcohol use: Not Currently   Drug use: Never     MEDICATIONS    Home Medication:    Current Medication:  Current Facility-Administered Medications:    acetaminophen (TYLENOL) tablet 650 mg, 650 mg, Oral, Q6H PRN **OR** acetaminophen (TYLENOL) suppository 650 mg, 650 mg, Rectal, Q6H PRN, Sudini, Srikar, MD   albuterol (PROVENTIL) (2.5 MG/3ML) 0.083% nebulizer solution 2.5 mg, 2.5 mg, Nebulization, Q4H PRN, Gouru, Aruna, MD, 2.5 mg at 10/30/18 0404   amLODipine (NORVASC) tablet 5 mg, 5 mg, Oral, Daily, Sudini,  Srikar, MD, 5 mg at 10/30/18 2536   bisacodyl (DULCOLAX) suppository 10 mg, 10 mg, Rectal, Daily PRN, Hillary Bow, MD, 10 mg at 10/29/18 1108   citalopram (CELEXA) tablet 10 mg, 10 mg, Oral, Daily, Sudini, Srikar, MD, 10 mg at 64/40/34 7425   folic acid (FOLVITE) tablet 1 mg, 1 mg, Oral, Daily, Sudini, Srikar, MD, 1 mg at 10/30/18 0907   haloperidol lactate (HALDOL) injection 0.5 mg, 0.5 mg, Intravenous, Q12H PRN, Gouru, Aruna, MD, 0.5 mg at 10/29/18 1147   insulin aspart (novoLOG) injection 0-15 Units, 0-15 Units, Subcutaneous, TID WC, Sudini, Srikar, MD, 3 Units at 10/30/18 1352   insulin aspart (novoLOG) injection 0-5 Units, 0-5 Units, Subcutaneous, QHS, Sudini, Srikar, MD, 2 Units at 10/28/18 2144   ipratropium-albuterol (DUONEB) 0.5-2.5 (3) MG/3ML nebulizer solution 3 mL, 3 mL, Nebulization, TID, Lanney Gins, Kiante Petrovich, MD, 3 mL at 10/30/18 0806   magnesium hydroxide (MILK OF MAGNESIA) suspension 30 mL, 30 mL, Oral, Daily PRN, Sudini, Srikar, MD, 30 mL at 10/29/18 0833   methylPREDNISolone sodium succinate (SOLU-MEDROL) 125 mg/2 mL injection 60 mg, 60 mg, Intravenous, BID, Sudini, Srikar, MD, 60 mg at 10/30/18 0907   metoprolol tartrate (LOPRESSOR) tablet 25 mg, 25 mg, Oral, BID, Sudini, Srikar, MD, 25 mg at 10/30/18 [id number]  ondansetron (ZOFRAN) tablet 4 mg, 4 mg, Oral, Q6H PRN **OR** ondansetron (ZOFRAN) injection 4 mg, 4 mg, Intravenous, Q6H PRN, Sudini, Srikar, MD   oxyCODONE-acetaminophen (PERCOCET) 7.5-325 MG per tablet 1 tablet, 1 tablet, Oral, Q4H PRN, Hillary Bow, MD, 1 tablet at 10/29/18 0224   polyethylene glycol (MIRALAX / GLYCOLAX) packet 17 g, 17 g, Oral, BID, Sudini, Srikar, MD, 17 g at 10/30/18 0908   potassium chloride SA (KLOR-CON) CR tablet 10 mEq, 10 mEq, Oral, BID, Sudini, Srikar, MD, 10 mEq at 10/30/18 6761   pravastatin (PRAVACHOL) tablet 40 mg, 40 mg, Oral, Daily, Sudini, Srikar, MD, 40 mg at 10/29/18 1731   protein supplement (ENSURE MAX) liquid, 11 oz,  Oral, BID BM, Gouru, Aruna, MD, 11 oz at 10/30/18 1353   senna (SENOKOT) tablet 8.6 mg, 1 tablet, Oral, BID, Sudini, Srikar, MD, 8.6 mg at 10/30/18 0907   triamterene-hydrochlorothiazide (MAXZIDE-25) 37.5-25 MG per tablet 1 tablet, 1 tablet, Oral, Daily, Sudini, Srikar, MD, 1 tablet at 10/30/18 0908    ALLERGIES   Accupril [quinapril hcl]     REVIEW OF SYSTEMS    Review of Systems:  Gen:  Denies  fever, sweats, chills weigh loss  HEENT: Denies blurred vision, double vision, ear pain, eye pain, hearing loss, nose bleeds, sore throat Cardiac:  No dizziness, chest pain or heaviness, chest tightness,edema Resp:   Denies cough or sputum porduction, shortness of breath,wheezing, hemoptysis,  Gi: Denies swallowing difficulty, stomach pain, nausea or vomiting, diarrhea, constipation, bowel incontinence Gu:  Denies bladder incontinence, burning urine Ext:   Denies Joint pain, stiffness or swelling Skin: Denies  skin rash, easy bruising or bleeding or hives Endoc:  Denies polyuria, polydipsia , polyphagia or weight change Psych:   Denies depression, insomnia or hallucinations   Other:  All other systems negative   VS: BP (!) 109/96 (BP Location: Left Arm)    Pulse 79    Temp 97.7 F (36.5 C) (Oral)    Resp 20    Ht 5\' 6"  (1.676 m)    Wt 68.7 kg Comment: bed scale   SpO2 96%    BMI 24.45 kg/m      PHYSICAL EXAM    GENERAL:NAD, no fevers, chills, no weakness no fatigue HEAD: Normocephalic, atraumatic.  EYES: Pupils equal, round, reactive to light. Extraocular muscles intact. No scleral icterus.  MOUTH: Moist mucosal membrane. Dentition intact. No abscess noted.  EAR, NOSE, THROAT: Clear without exudates. No external lesions.  NECK: Supple. No thyromegaly. No nodules. No JVD.  PULMONARY: Decreased breath sounds on right CARDIOVASCULAR: S1 and S2. Regular rate and rhythm. No murmurs, rubs, or gallops. No edema. Pedal pulses 2+ bilaterally.  GASTROINTESTINAL: Soft, nontender,  nondistended. No masses. Positive bowel sounds. No hepatosplenomegaly.  MUSCULOSKELETAL: No swelling, clubbing, or edema. Range of motion full in all extremities.  NEUROLOGIC: Cranial nerves II through XII are intact. No gross focal neurological deficits. Sensation intact. Reflexes intact.  SKIN: No ulceration, lesions, rashes, or cyanosis. Skin warm and dry. Turgor intact.  PSYCHIATRIC: Mood, affect within normal limits. The patient is awake, alert and oriented x 3. Insight, judgment intact.       IMAGING    Ct Angio Head W Or Wo Contrast  Result Date: 10/08/2018 CLINICAL DATA:  Lung cancer. Balance disturbance. Abnormal flow left vertebral artery seen by MRI. EXAM: CT ANGIOGRAPHY HEAD AND NECK TECHNIQUE: Multidetector CT imaging of the head and neck was performed using the standard protocol during bolus administration of intravenous contrast. Multiplanar CT image  reconstructions and MIPs were obtained to evaluate the vascular anatomy. Carotid stenosis measurements (when applicable) are obtained utilizing NASCET criteria, using the distal internal carotid diameter as the denominator. CONTRAST:  49mL OMNIPAQUE IOHEXOL 350 MG/ML SOLN COMPARISON:  MRI 10/07/2018 FINDINGS: CT HEAD FINDINGS Brain: Mild age related volume loss. Mild chronic appearing small vessel change of the hemispheric white matter. No sign of acute infarction, mass lesion, hemorrhage, hydrocephalus or extra-axial collection. Vascular: Negative noncontrast appearance. Skull: Normal Sinuses: Clear Orbits: Normal Review of the MIP images confirms the above findings CTA NECK FINDINGS Aortic arch: Aortic atherosclerosis. No aneurysm or dissection. Branching pattern shows the congenital variation of a small left vertebral artery arising from the arch. Right carotid system: Common carotid artery widely patent to the bifurcation. Ordinary atherosclerotic plaque at the carotid bifurcation and ICA bulb but no stenosis. Cervical ICA widely patent.  Left carotid system: Common carotid artery widely patent to the bifurcation. Ordinary calcified plaque at the carotid bifurcation and ICA bulb but no stenosis. Cervical ICA widely patent. Vertebral arteries: Dominant left vertebral artery is widely patent at its origin and through the cervical region to the foramen magnum. Non dominant small left vertebral artery arises directly from the arch and is patent through the cervical region to the foramen magnum. Skeleton: Ordinary cervical spondylosis. Other neck: No mass or lymphadenopathy. Upper chest: See results recent chest evaluations. Scarring and emphysema at the lung apices. Layering pleural fluid on the right. Review of the MIP images confirms the above findings CTA HEAD FINDINGS Anterior circulation: Both internal carotid arteries are patent through the skull base and siphon regions. No siphon stenosis. Anterior and middle cerebral vessels are patent without proximal stenosis, aneurysm or vascular malformation. Posterior circulation: Dominant right vertebral artery is widely patent through the foramen magnum to the basilar. Congenital small left vertebral artery is patent through the foramen magnum, supplying left PICA, with a very rudimentary connection to the basilar. No basilar stenosis. Superior cerebellar and posterior cerebral arteries show flow. No stenosis or aneurysm. Venous sinuses: Patent and normal. Anatomic variants: None other significant. Review of the MIP images confirms the above findings IMPRESSION: Ordinary aortic atherosclerosis. Ordinary atherosclerosis at the carotid bifurcations without stenosis. No intracranial anterior circulation finding of significance. Patient has the congenital variation of asymmetric vertebral anatomy. Dominant normal appearing right vertebral artery widely patent to the basilar. Tiny left vertebral artery arising directly from the arch, but patent through the neck, supplying left PICA and with a rudimentary  contribution to the basilar. This is not likely to be of any clinical relevance. Electronically Signed   By: Nelson Chimes M.D.   On: 10/08/2018 10:22   Dg Chest 1 View  Result Date: 10/30/2018 CLINICAL DATA:  Status post right thoracentesis EXAM: CHEST  1 VIEW COMPARISON:  10/29/2018 FINDINGS: Cardiac shadow is stable. Aortic calcifications are again seen. There is been near complete elimination of the right-sided pleural effusion following thoracentesis. No pneumothorax is noted. Pleural nodularity is noted consistent with localized metastatic disease similar to that seen on prior PET-CT. The known right lower lobe mass lesion is again seen. Left lung is hyperinflated but clear. IMPRESSION: No pneumothorax following thoracentesis. Changes consistent with known right lower lobe mass and pleural metastatic disease. Electronically Signed   By: Inez Catalina M.D.   On: 10/30/2018 13:40   Dg Chest 2 View  Result Date: 10/27/2018 CLINICAL DATA:  Right lung malignancy. EXAM: CHEST - 2 VIEW COMPARISON:  Chest radiograph dated 10/24/2018 and CT chest  dated 10/05/2018 FINDINGS: A moderate right pleural effusion with associated atelectasis has increased since prior exam. Right lateral nodular pleural thickening is not significantly changed. The left lung is clear. There is no pneumothorax. Emphysematous changes are noted. The cardiomediastinal silhouette is unchanged. Calcifications are seen in the aortic arch. IMPRESSION: Increased moderate right malignant pleural effusion. Aortic Atherosclerosis (ICD10-I70.0) and Emphysema (ICD10-J43.9). Electronically Signed   By: Zerita Boers M.D.   On: 10/27/2018 13:09   Dg Chest 2 View  Result Date: 10/24/2018 CLINICAL DATA:  Assess pleural fluid EXAM: CHEST - 2 VIEW COMPARISON:  10/06/2018, CT chest, 10/05/2018 FINDINGS: Interval increase in a right-sided pleural effusion, now moderate, with associated atelectasis or consolidation and multiple pleural based nodules, better  characterized by prior CT. Emphysema. The heart mediastinum are unremarkable. Disc degenerative disease of the thoracic spine IMPRESSION: 1. Interval increase in a right-sided pleural effusion, now moderate, with associated atelectasis or consolidation and multiple pleural based nodules, better characterized by prior CT. 2.  Emphysema. Electronically Signed   By: Eddie Candle M.D.   On: 10/24/2018 10:53   Dg Chest 2 View  Result Date: 10/05/2018 CLINICAL DATA:  Shortness of breath EXAM: CHEST - 2 VIEW COMPARISON:  None. FINDINGS: Small right pleural effusion with lateral loculated component versus pleural nodularity. The lungs are hyperexpanded with increased interstitial markings. Heart size is normal. There is calcific aortic atherosclerosis. IMPRESSION: Small right pleural effusion with lateral loculated component versus pleural nodularity. In the setting of recent trauma, this may indicate nondisplaced or minimally displaced rib fractures with a small hemothorax. CT may be helpful for further investigation. Electronically Signed   By: Ulyses Jarred M.D.   On: 10/05/2018 19:10   Ct Angio Neck W Or Wo Contrast  Result Date: 10/08/2018 CLINICAL DATA:  Lung cancer. Balance disturbance. Abnormal flow left vertebral artery seen by MRI. EXAM: CT ANGIOGRAPHY HEAD AND NECK TECHNIQUE: Multidetector CT imaging of the head and neck was performed using the standard protocol during bolus administration of intravenous contrast. Multiplanar CT image reconstructions and MIPs were obtained to evaluate the vascular anatomy. Carotid stenosis measurements (when applicable) are obtained utilizing NASCET criteria, using the distal internal carotid diameter as the denominator. CONTRAST:  37mL OMNIPAQUE IOHEXOL 350 MG/ML SOLN COMPARISON:  MRI 10/07/2018 FINDINGS: CT HEAD FINDINGS Brain: Mild age related volume loss. Mild chronic appearing small vessel change of the hemispheric white matter. No sign of acute infarction, mass  lesion, hemorrhage, hydrocephalus or extra-axial collection. Vascular: Negative noncontrast appearance. Skull: Normal Sinuses: Clear Orbits: Normal Review of the MIP images confirms the above findings CTA NECK FINDINGS Aortic arch: Aortic atherosclerosis. No aneurysm or dissection. Branching pattern shows the congenital variation of a small left vertebral artery arising from the arch. Right carotid system: Common carotid artery widely patent to the bifurcation. Ordinary atherosclerotic plaque at the carotid bifurcation and ICA bulb but no stenosis. Cervical ICA widely patent. Left carotid system: Common carotid artery widely patent to the bifurcation. Ordinary calcified plaque at the carotid bifurcation and ICA bulb but no stenosis. Cervical ICA widely patent. Vertebral arteries: Dominant left vertebral artery is widely patent at its origin and through the cervical region to the foramen magnum. Non dominant small left vertebral artery arises directly from the arch and is patent through the cervical region to the foramen magnum. Skeleton: Ordinary cervical spondylosis. Other neck: No mass or lymphadenopathy. Upper chest: See results recent chest evaluations. Scarring and emphysema at the lung apices. Layering pleural fluid on the right. Review  of the MIP images confirms the above findings CTA HEAD FINDINGS Anterior circulation: Both internal carotid arteries are patent through the skull base and siphon regions. No siphon stenosis. Anterior and middle cerebral vessels are patent without proximal stenosis, aneurysm or vascular malformation. Posterior circulation: Dominant right vertebral artery is widely patent through the foramen magnum to the basilar. Congenital small left vertebral artery is patent through the foramen magnum, supplying left PICA, with a very rudimentary connection to the basilar. No basilar stenosis. Superior cerebellar and posterior cerebral arteries show flow. No stenosis or aneurysm. Venous  sinuses: Patent and normal. Anatomic variants: None other significant. Review of the MIP images confirms the above findings IMPRESSION: Ordinary aortic atherosclerosis. Ordinary atherosclerosis at the carotid bifurcations without stenosis. No intracranial anterior circulation finding of significance. Patient has the congenital variation of asymmetric vertebral anatomy. Dominant normal appearing right vertebral artery widely patent to the basilar. Tiny left vertebral artery arising directly from the arch, but patent through the neck, supplying left PICA and with a rudimentary contribution to the basilar. This is not likely to be of any clinical relevance. Electronically Signed   By: Nelson Chimes M.D.   On: 10/08/2018 10:22   Ct Angio Chest Pe W And/or Wo Contrast  Result Date: 10/05/2018 CLINICAL DATA:  Chest pain EXAM: CT ANGIOGRAPHY CHEST WITH CONTRAST TECHNIQUE: Multidetector CT imaging of the chest was performed using the standard protocol during bolus administration of intravenous contrast. Multiplanar CT image reconstructions and MIPs were obtained to evaluate the vascular anatomy. CONTRAST:  68mL OMNIPAQUE IOHEXOL 350 MG/ML SOLN COMPARISON:  Chest x-ray today FINDINGS: Cardiovascular: No filling defects in the pulmonary arteries to suggest pulmonary emboli. Heart is normal size. Aorta is normal caliber. Coronary artery and aortic atherosclerosis. Mediastinum/Nodes: Enlarged right hilar lymph nodes with index node having a short axis diameter of 10 mm. No mediastinal, left hilar or axillary adenopathy. Lungs/Pleura: There is concern for right lower lobe mass measuring approximately 8.4 x 6.9 cm. Associated large right joint effusion with pleural nodules concerning for metastatic pleural involvement. Compressive atelectasis in the right lower lobe. No focal opacity on the left. Moderate to advanced emphysema. Upper Abdomen: Imaging into the upper abdomen shows no acute findings. Musculoskeletal: Destructive  mass noted in the lateral right 7th rib. Bone destruction also in the lateral right 8th rib. Diffuse degenerative changes in the thoracic spine. Review of the MIP images confirms the above findings. IMPRESSION: Large right lower lobe mass measures up to 8.4 cm most compatible with lung cancer. This is associated with numerous pleural nodules, large right pleural effusion compatible with pleural metastatic disease. There also bony destructive lytic lesions in the right lateral 7th and 8th ribs. Moderate to advanced emphysema. No evidence of pulmonary embolus. Aortic Atherosclerosis (ICD10-I70.0) and Emphysema (ICD10-J43.9). Electronically Signed   By: Rolm Baptise M.D.   On: 10/05/2018 21:10   Mr Jeri Cos ZO Contrast  Result Date: 10/07/2018 CLINICAL DATA:  85 year old male with large right lung mass. Loss of balance over the past 3-4 months. Staging. EXAM: MRI HEAD WITHOUT AND WITH CONTRAST TECHNIQUE: Multiplanar, multiecho pulse sequences of the brain and surrounding structures were obtained without and with intravenous contrast. CONTRAST:  23mL GADAVIST GADOBUTROL 1 MMOL/ML IV SOLN COMPARISON:  None. FINDINGS: Brain: Mild motion artifact on post-contrast images. No abnormal enhancement identified. No midline shift, mass effect, or evidence of intracranial mass lesion. Cerebral volume is within normal limits for age. No restricted diffusion to suggest acute infarction. No ventriculomegaly, extra-axial collection  or acute intracranial hemorrhage. Cervicomedullary junction and pituitary are within normal limits. Minimal to mild for age scattered nonspecific cerebral white matter T2 and FLAIR hyperintensity. No cortical encephalomalacia or chronic cerebral blood products. There is a possible small chronic subcortical white matter lacune in the left anterior frontal lobe on series 10, image 15. Vascular: Loss of the distal left vertebral artery flow void at the skull base although there does appear to be some left V4  segment collateral flow. Other Major intracranial vascular flow voids are preserved. The major dural venous sinuses are enhancing and appear to be patent. Skull and upper cervical spine: Negative visible cervical spine and spinal cord. Visible bone marrow signal is normal. Sinuses/Orbits: Postoperative changes to the globes, otherwise negative orbits. Paranasal sinuses are clear. Other: Mastoids are clear. Visible internal auditory structures appear normal. Scalp and face soft tissues appear negative. IMPRESSION: 1. No metastatic disease or acute intracranial abnormality identified. 2. Distal left vertebral artery with poor flow or occlusion, most likely due to atherosclerosis. CTA or MRA of the neck and head could evaluate further as necessary. Electronically Signed   By: Genevie Ann M.D.   On: 10/07/2018 16:50   Nm Pet Image Initial (pi) Skull Base To Thigh  Result Date: 10/18/2018 CLINICAL DATA:  Initial treatment strategy for lung adenocarcinoma. RIGHT lower lobe mass. EXAM: NUCLEAR MEDICINE PET SKULL BASE TO THIGH TECHNIQUE: 8.0 mCi F-18 FDG was injected intravenously. Full-ring PET imaging was performed from the skull base to thigh after the radiotracer. CT data was obtained and used for attenuation correction and anatomic localization. Fasting blood glucose: 150 mg/dl COMPARISON:  Chest CT October 05, 2018 FINDINGS: Mediastinal blood pool activity: SUV max 2.64 Liver activity: SUV max NA NECK: No hypermetabolic cervical lymph nodes. Incidental CT findings: none Chest: Hypermetabolic mass in the RIGHT lower lobe measures 7.5 x 5.7 cm with intense metabolic activity (SUV max equal 17.8). There is hypermetabolic pleural thickening in the RIGHT upper lobe and RIGHT lower lobe. For example, RIGHT upper lobe pleural nodule measuring 2.9 x 1.4 cm witth SUV max equal 7.7. There is a moderate RIGHT pleural effusion. Within the fusion is a hypermetabolic pleural nodule SUV max equal 4.9. Approximately 12  hypermetabolic pleural nodules in the RIGHT lung. One of these pleural nodules invades the cortex of the adjacent 7th rib (image 218 of the fused data set). Hypermetabolic RIGHT hilar lymph node. No central hypermetabolic mediastinal lymph nodes. No hypermetabolic supraclavicular adenopathy. Incidental CT findings: none ABDOMEN/PELVIS: Adjacent to the RIGHT adrenal gland is hypermetabolic nodular thickening along the diaphragm. Adrenal glands are normal. No abnormal activity within the liver. No hypermetabolic upper abdominal lymph nodes. No hypermetabolic pelvic lymph nodes. Incidental CT findings: Prostate enlarged. Atherosclerotic calcification of the aorta. SKELETON: Cortical invasion of the RIGHT seventh rib as described in the chest section no additional evidence skeletal metastasis Incidental CT findings: none IMPRESSION: 1. Large hypermetabolic RIGHT lower lobe mass consistent bronchogenic carcinoma. 2. Multifocal pleural metastasis within the RIGHT hemithorax. 3. One pleural metastatic lesion invades adjacent RIGHT seventh rib. 4. Moderate RIGHT effusion. 5. Probable RIGHT hilar metastatic adenopathy. No mediastinal or supraclavicular metastatic adenopathy. Electronically Signed   By: Suzy Bouchard M.D.   On: 10/18/2018 12:28   Dg Chest Port 1 View  Result Date: 10/29/2018 CLINICAL DATA:  85 year old male with history of shortness of breath. Stage IV lung cancer. Follow-up study. EXAM: PORTABLE CHEST 1 VIEW COMPARISON:  Chest x-ray 10/27/2018. FINDINGS: Opacity at the right base compatible with  known mass. There is adjacent atelectasis and/or consolidation, as well as a moderate right pleural effusion. Irregularity along the lateral aspect of the right hemithorax compatible with loculated areas of pleural fluid and/or pleural nodularity, better demonstrated on prior PET-CT 10/18/2018. Left lung is clear. No left pleural effusion. No evidence of pulmonary edema. Heart size is normal. Upper mediastinal  contours are within normal limits. Aortic atherosclerosis. IMPRESSION: 1. Allowing for differences in patient positioning, the radiographic appearance the chest is very similar to the prior study, as above. Electronically Signed   By: Vinnie Langton M.D.   On: 10/29/2018 18:50   Dg Chest Port 1 View  Result Date: 10/06/2018 CLINICAL DATA:  Post right-sided thoracentesis EXAM: PORTABLE CHEST 1 VIEW COMPARISON:  Chest radiograph-10/05/2018; chest CT-10/05/2018 FINDINGS: Grossly unchanged cardiac silhouette and mediastinal contours with atherosclerotic plaque within the thoracic aorta. Interval reduction/near resolution of right-sided pleural effusion post thoracentesis. No pneumothorax. Improved aeration of the right lung base with persistent masslike consolidative opacities and partial atelectasis/collapse. There is persistent pleuroparenchymal thickening about the peripheral aspect of the right mid lung. The left hemithorax remains well aerated. Similar findings of lung hyperexpansion. No evidence of edema. No acute osseous abnormalities. Degenerative change the right glenohumeral joint. IMPRESSION: 1. Interval reduction/near resolution of right-sided pleural effusion post thoracentesis. No pneumothorax. 2. Improved aeration of the right lower lung with persistent masslike consolidative opacities and partial atelectasis/collapse. 3. Grossly unchanged pleuroparenchymal thickening along the right lateral chest wall compatible with suspected pleural metastasis demonstrated on preceding chest CT. 4. Aortic Atherosclerosis (ICD10-I70.0) and Emphysema (ICD10-J43.9). Electronically Signed   By: Sandi Mariscal M.D.   On: 10/06/2018 10:04   Dg Abd 2 Views  Result Date: 10/27/2018 CLINICAL DATA:  Abdominal pain EXAM: ABDOMEN - 2 VIEW COMPARISON:  CT abdomen pelvis dated 09/16/2012. PET/CT dated 10/18/2018. FINDINGS: The bowel gas pattern is nonobstructive. There is no evidence of free air. No radio-opaque calculi or  other significant radiographic abnormality is seen. A right pleural effusion with associated mass representing the patient's known malignancy is partially imaged. IMPRESSION: Nonobstructive bowel gas pattern. Electronically Signed   By: Zerita Boers M.D.   On: 10/27/2018 13:06   US Thoracentesis Asp Pleural Space W/img Guide  Result Date: 10/30/2018 INDICATION: Recurrent right-sided effusion EXAM: ULTRASOUND GUIDED RIGHT THORACENTESIS MEDICATIONS: None. COMPLICATIONS: None immediate. PROCEDURE: An ultrasound guided thoracentesis was thoroughly discussed with the patient and questions answered. The benefits, risks, alternatives and complications were also discussed. The patient understands and wishes to proceed with the procedure. Written consent was obtained. Ultrasound was performed to localize and mark an adequate pocket of fluid in the right chest. The area was then prepped and draped in the normal sterile fashion. 1% Lidocaine was used for local anesthesia. Under ultrasound guidance a 6 Fr Safe-T-Centesis catheter was introduced. Thoracentesis was performed. The catheter was removed and a dressing applied. FINDINGS: A total of approximately 1 L of clear yellow fluid was removed. Samples were sent to the laboratory as requested by the clinical team. IMPRESSION: Successful ultrasound guided right thoracentesis yielding 1 L of pleural fluid. Electronically Signed   By: Inez Catalina M.D.   On: 10/30/2018 13:37   US Thoracentesis Asp Pleural Space W/img Guide  Result Date: 10/06/2018 INDICATION: No known primary, now with concern for metastatic lung cancer with symptomatic right-sided pleural effusion. Please perform ultrasound-guided thoracentesis for diagnostic and therapeutic purposes. EXAM: US THORACENTESIS ASP PLEURAL SPACE W/IMG GUIDE COMPARISON:  Chest radiograph-10/05/2018; chest CT-10/05/2018 MEDICATIONS: None. COMPLICATIONS: None immediate.  TECHNIQUE: Informed written consent was obtained from the  patient after a discussion of the risks, benefits and alternatives to treatment. A timeout was performed prior to the initiation of the procedure. Initial ultrasound scanning demonstrates a moderate sized right-sided anechoic pleural effusion. The lower chest was prepped and draped in the usual sterile fashion. 1% lidocaine was used for local anesthesia. An ultrasound image was saved for documentation purposes. An 8 Fr Safe-T-Centesis catheter was introduced. The thoracentesis was performed. The catheter was removed and a dressing was applied. The patient tolerated the procedure well without immediate post procedural complication. The patient was escorted to have an upright chest radiograph. FINDINGS: A total of approximately 1.2 liters of serous fluid was removed. Requested samples were sent to the laboratory. IMPRESSION: Successful ultrasound-guided right sided thoracentesis yielding 1.2 liters of pleural fluid. Electronically Signed   By: Sandi Mariscal M.D.   On: 10/06/2018 12:19                                ASSESSMENT/PLAN   Shortness of breath due to worsening right pleural effusion -Discussed current findings with patient and wife at bedside today. -Patient reports significant improvement in shortness of breath post thoracentesis today.  CXR reviewed with significant improvement.   -Cleared from pulm for d/c home if appropriate from primary team.  -Currently undergoing work-up with Dr. Mike Gip oncology -Recommend tunneled pleural catheter for palliation of recurrent malignant effusion     Thank you for allowing me to participate in the care of this patient.     Patient/Family are satisfied with care plan and all questions have been answered.  This document was prepared using Dragon voice recognition software and may include unintentional dictation errors.     Ottie Glazier, M.D.  Division of [facility]

## 2018-10-30 NOTE — Progress Notes (Signed)
Pt ambulated to bathroom, then started experiencing dyspnea along with productive cough and hemoptysis. PRN breathing treatment administered, which patient reported feeling a little better and that feeling will subside with rest. Will continue to monitor and update as needed.

## 2018-10-30 NOTE — Procedures (Signed)
[place] right thoracentesis without difficulty  Complications:  None  Blood Loss: none  See dictation in canopy pacs

## 2018-10-30 NOTE — Consult Note (Signed)
[facility]  [phone] [phone]   Name: Andres Hebert Date: 10/30/2018 MRN: [id number]  DOB: Sep 14, 1931  Patient Care Team: [name], MD as PCP - General (Family Medicine) Lequita Asal, MD as Referring Physician (Hematology and Oncology)    REASON FOR CONSULTATION: Palliative Care consult requested for this 85 y.o. male with multiple medical problems including mild cognitive impairment with recent diagnosis of stage IV adenocarcinoma of the lung with malignant pleural effusion admitted to the hospital on 10/28/2018 with shortness of breath and was found to have a worsening right-sided pleural effusion.  Palliative care was consulted to help address goals and manage ongoing symptoms.   SOCIAL HISTORY:     reports that he quit smoking about 44 years ago. His smoking use included cigarettes. He has a 25.00 pack-year smoking history. He has never used smokeless tobacco. He reports previous alcohol use. He reports that he does not use drugs.  Patient is married lives at home with his wife and oldest son.  He has another son and daughter who lives nearby.  Patient retired from [employer] and later worked at [employer].  ADVANCE DIRECTIVES:  Not on file  CODE STATUS: DNR  PAST MEDICAL HISTORY: Past Medical History:  Diagnosis Date   BPV (benign positional vertigo)    Chronic constipation    Diabetes mellitus without complication ([place])    Hyperlipidemia    Hypertension     PAST SURGICAL HISTORY: History reviewed. No pertinent surgical history.  HEMATOLOGY/ONCOLOGY HISTORY:  Oncology History  Primary adenocarcinoma of lower lobe of right lung ([place])  10/13/2018 Initial Diagnosis   Primary adenocarcinoma of lower lobe of right lung ([place])   10/31/2018 -  Chemotherapy   The patient had palonosetron (ALOXI) injection 0.25 mg, 0.25 mg, Intravenous,  Once, 0 of 4 cycles PEMEtrexed (ALIMTA) 900 mg in sodium  chloride 0.9 % 100 mL chemo infusion, 500 mg/m2 = 900 mg, Intravenous,  Once, 0 of 6 cycles CARBOplatin (PARAPLATIN) in sodium chloride 0.9 % 100 mL chemo infusion, , Intravenous,  Once, 0 of 4 cycles pembrolizumab (KEYTRUDA) 200 mg in sodium chloride 0.9 % 50 mL chemo infusion, 200 mg, Intravenous, Once, 0 of 6 cycles fosaprepitant (EMEND) 150 mg, dexamethasone (DECADRON) 12 mg in sodium chloride 0.9 % 145 mL IVPB, , Intravenous,  Once, 0 of 4 cycles  for chemotherapy treatment.      ALLERGIES:  is allergic to accupril [quinapril hcl].  MEDICATIONS:  Current Facility-Administered Medications  Medication Dose Route Frequency Provider Last Rate Last Dose   acetaminophen (TYLENOL) tablet 650 mg  650 mg Oral Q6H PRN Hillary Bow, MD       Or   acetaminophen (TYLENOL) suppository 650 mg  650 mg Rectal Q6H PRN Hillary Bow, MD       albuterol (PROVENTIL) (2.5 MG/3ML) 0.083% nebulizer solution 2.5 mg  2.5 mg Nebulization Q4H PRN Gouru, Aruna, MD   2.5 mg at 10/30/18 0404   amLODipine (NORVASC) tablet 5 mg  5 mg Oral Daily Sudini, Alveta Heimlich, MD   5 mg at 10/30/18 8366   bisacodyl (DULCOLAX) suppository 10 mg  10 mg Rectal Daily PRN Hillary Bow, MD   10 mg at 10/29/18 1108   citalopram (CELEXA) tablet 10 mg  10 mg Oral Daily Hillary Bow, MD   10 mg at 29/47/65 4650   folic acid (FOLVITE) tablet 1 mg  1 mg Oral Daily Sudini, Alveta Heimlich, MD   1 mg at  10/30/18 0907   haloperidol lactate (HALDOL) injection 0.5 mg  0.5 mg Intravenous Q12H PRN Gouru, Aruna, MD   0.5 mg at 10/29/18 1147   insulin aspart (novoLOG) injection 0-15 Units  0-15 Units Subcutaneous TID WC Hillary Bow, MD   2 Units at 10/30/18 1726   insulin aspart (novoLOG) injection 0-5 Units  0-5 Units Subcutaneous QHS Hillary Bow, MD   2 Units at 10/28/18 2144   ipratropium-albuterol (DUONEB) 0.5-2.5 (3) MG/3ML nebulizer solution 3 mL  3 mL Nebulization TID Ottie Glazier, MD   3 mL at 10/30/18 1419   magnesium hydroxide  (MILK OF MAGNESIA) suspension 30 mL  30 mL Oral Daily PRN Hillary Bow, MD   30 mL at 10/29/18 8786   methylPREDNISolone sodium succinate (SOLU-MEDROL) 125 mg/2 mL injection 60 mg  60 mg Intravenous BID Hillary Bow, MD   60 mg at 10/30/18 7672   metoprolol tartrate (LOPRESSOR) tablet 25 mg  25 mg Oral BID Hillary Bow, MD   25 mg at 10/30/18 0907   ondansetron (ZOFRAN) tablet 4 mg  4 mg Oral Q6H PRN Hillary Bow, MD       Or   ondansetron (ZOFRAN) injection 4 mg  4 mg Intravenous Q6H PRN Sudini, Alveta Heimlich, MD       oxyCODONE-acetaminophen (PERCOCET) 7.5-325 MG per tablet 1 tablet  1 tablet Oral Q4H PRN Hillary Bow, MD   1 tablet at 10/29/18 0224   polyethylene glycol (MIRALAX / GLYCOLAX) packet 17 g  17 g Oral BID Hillary Bow, MD   17 g at 10/30/18 0908   potassium chloride SA (KLOR-CON) CR tablet 10 mEq  10 mEq Oral BID Hillary Bow, MD   10 mEq at 10/30/18 0907   pravastatin (PRAVACHOL) tablet 40 mg  40 mg Oral Daily Sudini, Alveta Heimlich, MD   40 mg at 10/30/18 1724   protein supplement (ENSURE MAX) liquid  11 oz Oral BID BM Gouru, Aruna, MD   11 oz at 10/30/18 1353   senna (SENOKOT) tablet 8.6 mg  1 tablet Oral BID Hillary Bow, MD   8.6 mg at 10/30/18 0907   triamterene-hydrochlorothiazide (MAXZIDE-25) 37.5-25 MG per tablet 1 tablet  1 tablet Oral Daily Hillary Bow, MD   1 tablet at 10/30/18 0908    VITAL SIGNS: BP 126/66 (BP Location: Right Arm)    Pulse 90    Temp 97.7 F (36.5 C) (Oral)    Resp 20    Ht 5' 6" (1.676 m)    Wt 151 lb 7.3 oz (68.7 kg) Comment: bed scale   SpO2 93%    BMI 24.45 kg/m  Filed Weights   10/28/18 0809 10/29/18 1428  Weight: 160 lb (72.6 kg) 151 lb 7.3 oz (68.7 kg)    Estimated body mass index is 24.45 kg/m as calculated from the following:   Height as of this encounter: 5' 6" (1.676 m).   Weight as of this encounter: 151 lb 7.3 oz (68.7 kg).  LABS: CBC:    Component Value Date/Time   WBC 7.4 10/29/2018 0416   HGB 12.2 (L)  10/29/2018 0416   HGB 15.7 09/16/2012 0829   HCT 36.7 (L) 10/29/2018 0416   HCT 45.0 09/16/2012 0829   PLT 164 10/29/2018 0416   PLT 130 (L) 09/16/2012 0829   MCV 92.4 10/29/2018 0416   MCV 95 09/16/2012 0829   Comprehensive Metabolic Panel:    Component Value Date/Time   NA 133 (L) 10/29/2018 0416   NA 137 09/16/2012 0829  K 3.7 10/29/2018 0416   K 3.7 09/16/2012 0829   CL 94 (L) 10/29/2018 0416   CL 99 09/16/2012 0829   CO2 29 10/29/2018 0416   CO2 32 09/16/2012 0829   BUN 25 (H) 10/29/2018 0416   BUN 17 09/16/2012 0829   CREATININE 0.91 10/29/2018 0416   CREATININE 1.10 09/16/2012 0829   GLUCOSE 194 (H) 10/29/2018 0416   GLUCOSE 163 (H) 09/16/2012 0829   CALCIUM 9.2 10/29/2018 0416   CALCIUM 9.6 09/16/2012 0829   AST 17 10/28/2018 0813   AST 19 09/16/2012 0829   ALT 20 10/28/2018 0813   ALT 28 09/16/2012 0829   ALKPHOS 61 10/28/2018 0813   ALKPHOS 82 09/16/2012 0829   BILITOT 0.8 10/28/2018 0813   BILITOT 0.7 09/16/2012 0829   PROT 7.7 10/28/2018 0813   PROT 7.9 09/16/2012 0829   ALBUMIN 3.8 10/28/2018 0813   ALBUMIN 4.1 09/16/2012 0829    RADIOGRAPHIC STUDIES: Ct Angio Head W Or Wo Contrast  Result Date: 10/08/2018 CLINICAL DATA:  Lung cancer. Balance disturbance. Abnormal flow left vertebral artery seen by MRI. EXAM: CT ANGIOGRAPHY HEAD AND NECK TECHNIQUE: Multidetector CT imaging of the head and neck was performed using the standard protocol during bolus administration of intravenous contrast. Multiplanar CT image reconstructions and MIPs were obtained to evaluate the vascular anatomy. Carotid stenosis measurements (when applicable) are obtained utilizing NASCET criteria, using the distal internal carotid diameter as the denominator. CONTRAST:  67m OMNIPAQUE IOHEXOL 350 MG/ML SOLN COMPARISON:  MRI 10/07/2018 FINDINGS: CT HEAD FINDINGS Brain: Mild age related volume loss. Mild chronic appearing small vessel change of the hemispheric white matter. No sign of acute  infarction, mass lesion, hemorrhage, hydrocephalus or extra-axial collection. Vascular: Negative noncontrast appearance. Skull: Normal Sinuses: Clear Orbits: Normal Review of the MIP images confirms the above findings CTA NECK FINDINGS Aortic arch: Aortic atherosclerosis. No aneurysm or dissection. Branching pattern shows the congenital variation of a small left vertebral artery arising from the arch. Right carotid system: Common carotid artery widely patent to the bifurcation. Ordinary atherosclerotic plaque at the carotid bifurcation and ICA bulb but no stenosis. Cervical ICA widely patent. Left carotid system: Common carotid artery widely patent to the bifurcation. Ordinary calcified plaque at the carotid bifurcation and ICA bulb but no stenosis. Cervical ICA widely patent. Vertebral arteries: Dominant left vertebral artery is widely patent at its origin and through the cervical region to the foramen magnum. Non dominant small left vertebral artery arises directly from the arch and is patent through the cervical region to the foramen magnum. Skeleton: Ordinary cervical spondylosis. Other neck: No mass or lymphadenopathy. Upper chest: See results recent chest evaluations. Scarring and emphysema at the lung apices. Layering pleural fluid on the right. Review of the MIP images confirms the above findings CTA HEAD FINDINGS Anterior circulation: Both internal carotid arteries are patent through the skull base and siphon regions. No siphon stenosis. Anterior and middle cerebral vessels are patent without proximal stenosis, aneurysm or vascular malformation. Posterior circulation: Dominant right vertebral artery is widely patent through the foramen magnum to the basilar. Congenital small left vertebral artery is patent through the foramen magnum, supplying left PICA, with a very rudimentary connection to the basilar. No basilar stenosis. Superior cerebellar and posterior cerebral arteries show flow. No stenosis or  aneurysm. Venous sinuses: Patent and normal. Anatomic variants: None other significant. Review of the MIP images confirms the above findings IMPRESSION: Ordinary aortic atherosclerosis. Ordinary atherosclerosis at the carotid bifurcations without stenosis. No intracranial  anterior circulation finding of significance. Patient has the congenital variation of asymmetric vertebral anatomy. Dominant normal appearing right vertebral artery widely patent to the basilar. Tiny left vertebral artery arising directly from the arch, but patent through the neck, supplying left PICA and with a rudimentary contribution to the basilar. This is not likely to be of any clinical relevance. Electronically Signed   By: Nelson Chimes M.D.   On: 10/08/2018 10:22   Dg Chest 1 View  Result Date: 10/30/2018 CLINICAL DATA:  Status post right thoracentesis EXAM: CHEST  1 VIEW COMPARISON:  10/29/2018 FINDINGS: Cardiac shadow is stable. Aortic calcifications are again seen. There is been near complete elimination of the right-sided pleural effusion following thoracentesis. No pneumothorax is noted. Pleural nodularity is noted consistent with localized metastatic disease similar to that seen on prior PET-CT. The known right lower lobe mass lesion is again seen. Left lung is hyperinflated but clear. IMPRESSION: No pneumothorax following thoracentesis. Changes consistent with known right lower lobe mass and pleural metastatic disease. Electronically Signed   By: Inez Catalina M.D.   On: 10/30/2018 13:40   Dg Chest 2 View  Result Date: 10/27/2018 CLINICAL DATA:  Right lung malignancy. EXAM: CHEST - 2 VIEW COMPARISON:  Chest radiograph dated 10/24/2018 and CT chest dated 10/05/2018 FINDINGS: A moderate right pleural effusion with associated atelectasis has increased since prior exam. Right lateral nodular pleural thickening is not significantly changed. The left lung is clear. There is no pneumothorax. Emphysematous changes are noted. The  cardiomediastinal silhouette is unchanged. Calcifications are seen in the aortic arch. IMPRESSION: Increased moderate right malignant pleural effusion. Aortic Atherosclerosis (ICD10-I70.0) and Emphysema (ICD10-J43.9). Electronically Signed   By: Zerita Boers M.D.   On: 10/27/2018 13:09   Dg Chest 2 View  Result Date: 10/24/2018 CLINICAL DATA:  Assess pleural fluid EXAM: CHEST - 2 VIEW COMPARISON:  10/06/2018, CT chest, 10/05/2018 FINDINGS: Interval increase in a right-sided pleural effusion, now moderate, with associated atelectasis or consolidation and multiple pleural based nodules, better characterized by prior CT. Emphysema. The heart mediastinum are unremarkable. Disc degenerative disease of the thoracic spine IMPRESSION: 1. Interval increase in a right-sided pleural effusion, now moderate, with associated atelectasis or consolidation and multiple pleural based nodules, better characterized by prior CT. 2.  Emphysema. Electronically Signed   By: Eddie Candle M.D.   On: 10/24/2018 10:53   Dg Chest 2 View  Result Date: 10/05/2018 CLINICAL DATA:  Shortness of breath EXAM: CHEST - 2 VIEW COMPARISON:  None. FINDINGS: Small right pleural effusion with lateral loculated component versus pleural nodularity. The lungs are hyperexpanded with increased interstitial markings. Heart size is normal. There is calcific aortic atherosclerosis. IMPRESSION: Small right pleural effusion with lateral loculated component versus pleural nodularity. In the setting of recent trauma, this may indicate nondisplaced or minimally displaced rib fractures with a small hemothorax. CT may be helpful for further investigation. Electronically Signed   By: Ulyses Jarred M.D.   On: 10/05/2018 19:10   Ct Angio Neck W Or Wo Contrast  Result Date: 10/08/2018 CLINICAL DATA:  Lung cancer. Balance disturbance. Abnormal flow left vertebral artery seen by MRI. EXAM: CT ANGIOGRAPHY HEAD AND NECK TECHNIQUE: Multidetector CT imaging of the head  and neck was performed using the standard protocol during bolus administration of intravenous contrast. Multiplanar CT image reconstructions and MIPs were obtained to evaluate the vascular anatomy. Carotid stenosis measurements (when applicable) are obtained utilizing NASCET criteria, using the distal internal carotid diameter as the denominator. CONTRAST:  67m OMNIPAQUE IOHEXOL 350 MG/ML SOLN COMPARISON:  MRI 10/07/2018 FINDINGS: CT HEAD FINDINGS Brain: Mild age related volume loss. Mild chronic appearing small vessel change of the hemispheric white matter. No sign of acute infarction, mass lesion, hemorrhage, hydrocephalus or extra-axial collection. Vascular: Negative noncontrast appearance. Skull: Normal Sinuses: Clear Orbits: Normal Review of the MIP images confirms the above findings CTA NECK FINDINGS Aortic arch: Aortic atherosclerosis. No aneurysm or dissection. Branching pattern shows the congenital variation of a small left vertebral artery arising from the arch. Right carotid system: Common carotid artery widely patent to the bifurcation. Ordinary atherosclerotic plaque at the carotid bifurcation and ICA bulb but no stenosis. Cervical ICA widely patent. Left carotid system: Common carotid artery widely patent to the bifurcation. Ordinary calcified plaque at the carotid bifurcation and ICA bulb but no stenosis. Cervical ICA widely patent. Vertebral arteries: Dominant left vertebral artery is widely patent at its origin and through the cervical region to the foramen magnum. Non dominant small left vertebral artery arises directly from the arch and is patent through the cervical region to the foramen magnum. Skeleton: Ordinary cervical spondylosis. Other neck: No mass or lymphadenopathy. Upper chest: See results recent chest evaluations. Scarring and emphysema at the lung apices. Layering pleural fluid on the right. Review of the MIP images confirms the above findings CTA HEAD FINDINGS Anterior circulation:  Both internal carotid arteries are patent through the skull base and siphon regions. No siphon stenosis. Anterior and middle cerebral vessels are patent without proximal stenosis, aneurysm or vascular malformation. Posterior circulation: Dominant right vertebral artery is widely patent through the foramen magnum to the basilar. Congenital small left vertebral artery is patent through the foramen magnum, supplying left PICA, with a very rudimentary connection to the basilar. No basilar stenosis. Superior cerebellar and posterior cerebral arteries show flow. No stenosis or aneurysm. Venous sinuses: Patent and normal. Anatomic variants: None other significant. Review of the MIP images confirms the above findings IMPRESSION: Ordinary aortic atherosclerosis. Ordinary atherosclerosis at the carotid bifurcations without stenosis. No intracranial anterior circulation finding of significance. Patient has the congenital variation of asymmetric vertebral anatomy. Dominant normal appearing right vertebral artery widely patent to the basilar. Tiny left vertebral artery arising directly from the arch, but patent through the neck, supplying left PICA and with a rudimentary contribution to the basilar. This is not likely to be of any clinical relevance. Electronically Signed   By: MNelson ChimesM.D.   On: 10/08/2018 10:22   Ct Angio Chest Pe W And/or Wo Contrast  Result Date: 10/05/2018 CLINICAL DATA:  Chest pain EXAM: CT ANGIOGRAPHY CHEST WITH CONTRAST TECHNIQUE: Multidetector CT imaging of the chest was performed using the standard protocol during bolus administration of intravenous contrast. Multiplanar CT image reconstructions and MIPs were obtained to evaluate the vascular anatomy. CONTRAST:  772mOMNIPAQUE IOHEXOL 350 MG/ML SOLN COMPARISON:  Chest x-ray today FINDINGS: Cardiovascular: No filling defects in the pulmonary arteries to suggest pulmonary emboli. Heart is normal size. Aorta is normal caliber. Coronary artery and  aortic atherosclerosis. Mediastinum/Nodes: Enlarged right hilar lymph nodes with index node having a short axis diameter of 10 mm. No mediastinal, left hilar or axillary adenopathy. Lungs/Pleura: There is concern for right lower lobe mass measuring approximately 8.4 x 6.9 cm. Associated large right joint effusion with pleural nodules concerning for metastatic pleural involvement. Compressive atelectasis in the right lower lobe. No focal opacity on the left. Moderate to advanced emphysema. Upper Abdomen: Imaging into the upper abdomen shows no acute findings.  Musculoskeletal: Destructive mass noted in the lateral right 7th rib. Bone destruction also in the lateral right 8th rib. Diffuse degenerative changes in the thoracic spine. Review of the MIP images confirms the above findings. IMPRESSION: Large right lower lobe mass measures up to 8.4 cm most compatible with lung cancer. This is associated with numerous pleural nodules, large right pleural effusion compatible with pleural metastatic disease. There also bony destructive lytic lesions in the right lateral 7th and 8th ribs. Moderate to advanced emphysema. No evidence of pulmonary embolus. Aortic Atherosclerosis (ICD10-I70.0) and Emphysema (ICD10-J43.9). Electronically Signed   By: Rolm Baptise M.D.   On: 10/05/2018 21:10   Mr Jeri Cos ON Contrast  Result Date: 10/07/2018 CLINICAL DATA:  85 year old male with large right lung mass. Loss of balance over the past 3-4 months. Staging. EXAM: MRI HEAD WITHOUT AND WITH CONTRAST TECHNIQUE: Multiplanar, multiecho pulse sequences of the brain and surrounding structures were obtained without and with intravenous contrast. CONTRAST:  25m GADAVIST GADOBUTROL 1 MMOL/ML IV SOLN COMPARISON:  None. FINDINGS: Brain: Mild motion artifact on post-contrast images. No abnormal enhancement identified. No midline shift, mass effect, or evidence of intracranial mass lesion. Cerebral volume is within normal limits for age. No restricted  diffusion to suggest acute infarction. No ventriculomegaly, extra-axial collection or acute intracranial hemorrhage. Cervicomedullary junction and pituitary are within normal limits. Minimal to mild for age scattered nonspecific cerebral white matter T2 and FLAIR hyperintensity. No cortical encephalomalacia or chronic cerebral blood products. There is a possible small chronic subcortical white matter lacune in the left anterior frontal lobe on series 10, image 15. Vascular: Loss of the distal left vertebral artery flow void at the skull base although there does appear to be some left V4 segment collateral flow. Other Major intracranial vascular flow voids are preserved. The major dural venous sinuses are enhancing and appear to be patent. Skull and upper cervical spine: Negative visible cervical spine and spinal cord. Visible bone marrow signal is normal. Sinuses/Orbits: Postoperative changes to the globes, otherwise negative orbits. Paranasal sinuses are clear. Other: Mastoids are clear. Visible internal auditory structures appear normal. Scalp and face soft tissues appear negative. IMPRESSION: 1. No metastatic disease or acute intracranial abnormality identified. 2. Distal left vertebral artery with poor flow or occlusion, most likely due to atherosclerosis. CTA or MRA of the neck and head could evaluate further as necessary. Electronically Signed   By: HGenevie AnnM.D.   On: 10/07/2018 16:50   Nm Pet Image Initial (pi) Skull Base To Thigh  Result Date: 10/18/2018 CLINICAL DATA:  Initial treatment strategy for lung adenocarcinoma. RIGHT lower lobe mass. EXAM: NUCLEAR MEDICINE PET SKULL BASE TO THIGH TECHNIQUE: 8.0 mCi F-18 FDG was injected intravenously. Full-ring PET imaging was performed from the skull base to thigh after the radiotracer. CT data was obtained and used for attenuation correction and anatomic localization. Fasting blood glucose: 150 mg/dl COMPARISON:  Chest CT October 05, 2018 FINDINGS:  Mediastinal blood pool activity: SUV max 2.64 Liver activity: SUV max NA NECK: No hypermetabolic cervical lymph nodes. Incidental CT findings: none Chest: Hypermetabolic mass in the RIGHT lower lobe measures 7.5 x 5.7 cm with intense metabolic activity (SUV max equal 17.8). There is hypermetabolic pleural thickening in the RIGHT upper lobe and RIGHT lower lobe. For example, RIGHT upper lobe pleural nodule measuring 2.9 x 1.4 cm witth SUV max equal 7.7. There is a moderate RIGHT pleural effusion. Within the fusion is a hypermetabolic pleural nodule SUV max equal 4.9. Approximately 12  hypermetabolic pleural nodules in the RIGHT lung. One of these pleural nodules invades the cortex of the adjacent 7th rib (image 218 of the fused data set). Hypermetabolic RIGHT hilar lymph node. No central hypermetabolic mediastinal lymph nodes. No hypermetabolic supraclavicular adenopathy. Incidental CT findings: none ABDOMEN/PELVIS: Adjacent to the RIGHT adrenal gland is hypermetabolic nodular thickening along the diaphragm. Adrenal glands are normal. No abnormal activity within the liver. No hypermetabolic upper abdominal lymph nodes. No hypermetabolic pelvic lymph nodes. Incidental CT findings: Prostate enlarged. Atherosclerotic calcification of the aorta. SKELETON: Cortical invasion of the RIGHT seventh rib as described in the chest section no additional evidence skeletal metastasis Incidental CT findings: none IMPRESSION: 1. Large hypermetabolic RIGHT lower lobe mass consistent bronchogenic carcinoma. 2. Multifocal pleural metastasis within the RIGHT hemithorax. 3. One pleural metastatic lesion invades adjacent RIGHT seventh rib. 4. Moderate RIGHT effusion. 5. Probable RIGHT hilar metastatic adenopathy. No mediastinal or supraclavicular metastatic adenopathy. Electronically Signed   By: Suzy Bouchard M.D.   On: 10/18/2018 12:28   Dg Chest Port 1 View  Result Date: 10/29/2018 CLINICAL DATA:  85 year old male with history of  shortness of breath. Stage IV lung cancer. Follow-up study. EXAM: PORTABLE CHEST 1 VIEW COMPARISON:  Chest x-ray 10/27/2018. FINDINGS: Opacity at the right base compatible with known mass. There is adjacent atelectasis and/or consolidation, as well as a moderate right pleural effusion. Irregularity along the lateral aspect of the right hemithorax compatible with loculated areas of pleural fluid and/or pleural nodularity, better demonstrated on prior PET-CT 10/18/2018. Left lung is clear. No left pleural effusion. No evidence of pulmonary edema. Heart size is normal. Upper mediastinal contours are within normal limits. Aortic atherosclerosis. IMPRESSION: 1. Allowing for differences in patient positioning, the radiographic appearance the chest is very similar to the prior study, as above. Electronically Signed   By: Vinnie Langton M.D.   On: 10/29/2018 18:50   Dg Chest Port 1 View  Result Date: 10/06/2018 CLINICAL DATA:  Post right-sided thoracentesis EXAM: PORTABLE CHEST 1 VIEW COMPARISON:  Chest radiograph-10/05/2018; chest CT-10/05/2018 FINDINGS: Grossly unchanged cardiac silhouette and mediastinal contours with atherosclerotic plaque within the thoracic aorta. Interval reduction/near resolution of right-sided pleural effusion post thoracentesis. No pneumothorax. Improved aeration of the right lung base with persistent masslike consolidative opacities and partial atelectasis/collapse. There is persistent pleuroparenchymal thickening about the peripheral aspect of the right mid lung. The left hemithorax remains well aerated. Similar findings of lung hyperexpansion. No evidence of edema. No acute osseous abnormalities. Degenerative change the right glenohumeral joint. IMPRESSION: 1. Interval reduction/near resolution of right-sided pleural effusion post thoracentesis. No pneumothorax. 2. Improved aeration of the right lower lung with persistent masslike consolidative opacities and partial atelectasis/collapse.  3. Grossly unchanged pleuroparenchymal thickening along the right lateral chest wall compatible with suspected pleural metastasis demonstrated on preceding chest CT. 4. Aortic Atherosclerosis (ICD10-I70.0) and Emphysema (ICD10-J43.9). Electronically Signed   By: Sandi Mariscal M.D.   On: 10/06/2018 10:04   Dg Abd 2 Views  Result Date: 10/27/2018 CLINICAL DATA:  Abdominal pain EXAM: ABDOMEN - 2 VIEW COMPARISON:  CT abdomen pelvis dated 09/16/2012. PET/CT dated 10/18/2018. FINDINGS: The bowel gas pattern is nonobstructive. There is no evidence of free air. No radio-opaque calculi or other significant radiographic abnormality is seen. A right pleural effusion with associated mass representing the patient's known malignancy is partially imaged. IMPRESSION: Nonobstructive bowel gas pattern. Electronically Signed   By: Zerita Boers M.D.   On: 10/27/2018 13:06   US Thoracentesis Asp Pleural Space W/img Guide  Result Date: 10/30/2018 INDICATION: Recurrent right-sided effusion EXAM: ULTRASOUND GUIDED RIGHT THORACENTESIS MEDICATIONS: None. COMPLICATIONS: None immediate. PROCEDURE: An ultrasound guided thoracentesis was thoroughly discussed with the patient and questions answered. The benefits, risks, alternatives and complications were also discussed. The patient understands and wishes to proceed with the procedure. Written consent was obtained. Ultrasound was performed to localize and mark an adequate pocket of fluid in the right chest. The area was then prepped and draped in the normal sterile fashion. 1% Lidocaine was used for local anesthesia. Under ultrasound guidance a 6 Fr Safe-T-Centesis catheter was introduced. Thoracentesis was performed. The catheter was removed and a dressing applied. FINDINGS: A total of approximately 1 L of clear yellow fluid was removed. Samples were sent to the laboratory as requested by the clinical team. IMPRESSION: Successful ultrasound guided right thoracentesis yielding 1 L of  pleural fluid. Electronically Signed   By: Inez Catalina M.D.   On: 10/30/2018 13:37   US Thoracentesis Asp Pleural Space W/img Guide  Result Date: 10/06/2018 INDICATION: No known primary, now with concern for metastatic lung cancer with symptomatic right-sided pleural effusion. Please perform ultrasound-guided thoracentesis for diagnostic and therapeutic purposes. EXAM: US THORACENTESIS ASP PLEURAL SPACE W/IMG GUIDE COMPARISON:  Chest radiograph-10/05/2018; chest CT-10/05/2018 MEDICATIONS: None. COMPLICATIONS: None immediate. TECHNIQUE: Informed written consent was obtained from the patient after a discussion of the risks, benefits and alternatives to treatment. A timeout was performed prior to the initiation of the procedure. Initial ultrasound scanning demonstrates a moderate sized right-sided anechoic pleural effusion. The lower chest was prepped and draped in the usual sterile fashion. 1% lidocaine was used for local anesthesia. An ultrasound image was saved for documentation purposes. An 8 Fr Safe-T-Centesis catheter was introduced. The thoracentesis was performed. The catheter was removed and a dressing was applied. The patient tolerated the procedure well without immediate post procedural complication. The patient was escorted to have an upright chest radiograph. FINDINGS: A total of approximately 1.2 liters of serous fluid was removed. Requested samples were sent to the laboratory. IMPRESSION: Successful ultrasound-guided right sided thoracentesis yielding 1.2 liters of pleural fluid. Electronically Signed   By: Sandi Mariscal M.D.   On: 10/06/2018 12:19    PERFORMANCE STATUS (ECOG) : 1 - Symptomatic but completely ambulatory  Review of Systems Unless otherwise noted, a complete review of systems is negative.  Physical Exam General: NAD, frail appearing, thin Pulmonary: Unlabored Extremities: no edema, no joint deformities Skin: no rashes Neurological: Weakness but otherwise  nonfocal  IMPRESSION: I met with patient and his wife to discuss goals.  Introduced palliative care services and attempted to establish therapeutic rapport.  Patient is status post therapeutic thoracentesis earlier today.  He reports that he is symptomatically improved with less shortness of breath and coughing.  He denies any other distressing symptoms at present.  I note that he has had a recent history of pain but it appears stable at present on current regimen of as needed Percocet.  Had a lengthy conversation with patient and his wife regarding his goals of treatment.  Further work-up including biopsy of his pleural nodule has been recommended by Dr. Mike Gip.  Patient initially was unsure if he would accept treatment or undergo biopsy.  However, after discussion he agreed that he would like to pursue treatment as long as he could maintain an acceptable quality of life and the treatment was not associated with too high symptom burden.  His wife agrees.  They recognize that they can change treatment decisions at any  point.  Prior to this hospitalization, patient lives at home with his wife and son.  He reports being functionally independent with his care.  His son was primarily in charge of household chores.  Patient confirms DNR.  Case and plan discussed with Drs. Corcoran and Gouru.  PLAN: -Continue current scope of treatment -Pleural nodule biopsy if possible prior to discharge from the hospital -Therapeutic thoracentesis as needed -Future consideration of pleurx catheter if needed -DNR   Patient expressed understanding and was in agreement with this plan. He also understands that He can call the clinic at any time with any questions, concerns, or complaints.     Time Total: 60 minutes  Visit consisted of counseling and education dealing with the complex and emotionally intense issues of symptom management and palliative care in the setting of serious and potentially  life-threatening illness.Greater than 50%  of this time was spent counseling and coordinating care related to the above assessment and plan.  Signed by: Altha Harm, PhD, NP-C [phone] (Work Cell)

## 2018-10-30 NOTE — Progress Notes (Signed)
Nutrition  Patient had appointment with RD at cancer center today.  Chart reviewed and noted inpatient admission.  Will reschedule as appropriate.    Terrance Lanahan B. Zenia Resides, [facility], [place] Registered Dietitian [phone] (pager)

## 2018-10-31 ENCOUNTER — Inpatient Hospital Stay: Payer: Medicare HMO

## 2018-10-31 LAB — GLUCOSE, CAPILLARY
Glucose-Capillary: 189 mg/dL — ABNORMAL HIGH (ref 70–99)
Glucose-Capillary: 226 mg/dL — ABNORMAL HIGH (ref 70–99)
Glucose-Capillary: 206 mg/dL — ABNORMAL HIGH (ref 70–99)

## 2018-10-31 MED ORDER — MIDAZOLAM HCL 5 MG/5ML IJ SOLN
INTRAMUSCULAR | Status: AC
  Filled 2018-10-31: qty 5

## 2018-10-31 MED ORDER — METHYLPREDNISOLONE SODIUM SUCC 40 MG IJ SOLR: 40.0000 mg | INTRAMUSCULAR | Status: DC

## 2018-10-31 MED ORDER — ACETAMINOPHEN 325 MG PO TABS: 650.0000 mg | ORAL_TABLET | Freq: Four times a day (QID) | ORAL | Status: DC | PRN

## 2018-10-31 MED ORDER — PREDNISONE 10 MG (21) PO TBPK: 10.0000 mg | ORAL_TABLET | Freq: Every day | ORAL | 0 refills | Status: DC

## 2018-10-31 MED ORDER — METOPROLOL TARTRATE 25 MG PO TABS: 12.5000 mg | ORAL_TABLET | Freq: Two times a day (BID) | ORAL | 0 refills | Status: DC

## 2018-10-31 MED ORDER — MIDAZOLAM HCL 5 MG/5ML IJ SOLN
INTRAMUSCULAR | Status: AC | PRN
  Administered 2018-10-31: 1 mg via INTRAVENOUS

## 2018-10-31 MED ORDER — POLYETHYLENE GLYCOL 3350 17 G PO PACK: 17.0000 g | PACK | Freq: Two times a day (BID) | ORAL | 0 refills | Status: AC

## 2018-10-31 MED ORDER — FENTANYL CITRATE (PF) 100 MCG/2ML IJ SOLN
INTRAMUSCULAR | Status: AC | PRN
  Administered 2018-10-31: 25 ug via INTRAVENOUS

## 2018-10-31 MED ORDER — FENTANYL CITRATE (PF) 100 MCG/2ML IJ SOLN
INTRAMUSCULAR | Status: AC
  Filled 2018-10-31: qty 2

## 2018-10-31 NOTE — TOC Initial Note (Signed)
Transition of Care [facility]) - Initial/Assessment Note    Patient Details  Name: Andres Hebert MRN: [id number] Date of Birth: [birth year]  [facility] CM/SW Contact:    Keilly Fatula, Lenice Llamas Phone Number: [phone]  10/31/2018, 2:56 PM  Clinical Narrative: Clinical Social Worker (CSW) met with patient and his wife [name] was at bedside. Patient was alert and oriented X3 and was laying in the bed. CSW introduced self and explained role of CSW department. Per wife they [place] in [place]. Patient reported that his plan is to D/C home from [facility]. Wife is in agreement to bring patient home. Per wife their adult son [name] lives in the home as well. Per wife [name] is helpful and they also have 2 other adult children that live near by. Per wife patient has 4 walkers and 1 bedside commode at home. Patient has no DME needs per wife. Wife reported that patient was mobile prior to hospitalization and used a cane. Per wife patient has [facility] home health. Patient and wife are agreeable to resume [facility] home health. Per Lauretta Chester home health representative they are open to patient for PT and will add RN at CSW's request. CSW will continue to follow and assist as needed.               Expected Discharge Plan: [place] Barriers to Discharge: Continued Medical Work up   Patient Goals and CMS Choice Patient states their goals for this hospitalization and ongoing recovery are:: To go home.      Expected Discharge Plan and Services Expected Discharge Plan: [place] In-house Referral: Clinical Social Work     Living arrangements for the past 2 months: Single Family Home                 DME Arranged: N/A         HH Arranged: RN, PT [place] Agency: [facility] Date [place]: 10/31/18 Time [place]: 1205 Representative spoke with at [place]: [place] Arrangements/Services Living  arrangements for the past 2 months: [place] with:: Spouse Patient language and need for interpreter reviewed:: No Do you feel safe going back to the place where you live?: Yes      Need for Family Participation in Patient Care: Yes (Comment) Care giver support system in place?: Yes (comment) Current home services: DME(Patient has 4 walkers and 1 bedside commode at home.) Criminal Activity/Legal Involvement Pertinent to Current Situation/Hospitalization: No - Comment as needed  Activities of Daily Living Home Assistive Devices/Equipment: Grab bars in shower ADL Screening (condition at time of admission) Patient's cognitive ability adequate to safely complete daily activities?: Yes Is the patient deaf or have difficulty hearing?: No Does the patient have difficulty seeing, even when wearing glasses/contacts?: No Does the patient have difficulty concentrating, remembering, or making decisions?: Yes Patient able to express need for assistance with ADLs?: Yes Does the patient have difficulty dressing or bathing?: No Independently performs ADLs?: Yes (appropriate for developmental age) Does the patient have difficulty walking or climbing stairs?: No Weakness of Legs: None Weakness of Arms/Hands: None  Permission Sought/Granted Permission sought to share information with : Other (comment)(Home Health agency) Permission granted to share information with : Yes, Verbal Permission Granted              Emotional Assessment Appearance:: Appears stated age Attitude/Demeanor/Rapport: Engaged Affect (typically observed):  Pleasant, Calm Orientation: : Oriented to Self, Oriented to Place, Oriented to  Time, Oriented to Situation Alcohol / Substance Use: Not Applicable Psych Involvement: No (comment)  Admission diagnosis:  Pleural effusion [J90] Malignant neoplasm of lung, unspecified laterality, unspecified part of lung ([place]) [C34.90] Patient Active Problem List   Diagnosis  Date Noted  . Cancer ([place])   . Palliative care encounter   . COPD exacerbation ([place]) 10/28/2018  . Primary adenocarcinoma of lower lobe of right lung ([place]) 10/13/2018  . Bone metastasis ([place]) 10/13/2018  . Goals of care, counseling/discussion 10/13/2018  . Cancer-related pain 10/13/2018  . Pleural effusion 10/05/2018  . Diabetes mellitus type 2, uncomplicated ([place]) 15/87/2761   PCP:  [name], MD Pharmacy:   [id number] [place] [place] [id number]: [phone]: [phone]    Social Determinants of Health (SDOH) Interventions    Readmission Risk Interventions Readmission Risk Prevention Plan 10/08/2018  Post Dischage Appt Complete  Medication Screening Complete  Transportation Screening Complete  Some recent data might be hidden

## 2018-10-31 NOTE — Care Management Important Message (Signed)
Important Message  Patient Details  Name: CORDALE MANERA MRN: [id number] Date of Birth: 1931-04-09   Medicare Important Message Given:  Yes     Juliann Pulse A Langdon Crosson 10/31/2018, 10:48 AM

## 2018-10-31 NOTE — Discharge Summary (Signed)
[facility] [place] NAME: Andres Hebert    MR#:  [id number]  DATE OF BIRTH:  [birth year]  DATE OF ADMISSION:  10/28/2018 ADMITTING PHYSICIAN: Hillary Bow, MD  DATE OF DISCHARGE:  10/31/18   PRIMARY CARE PHYSICIAN: [name], MD    ADMISSION DIAGNOSIS:  Pleural effusion [J90] Malignant neoplasm of lung, unspecified laterality, unspecified part of lung ([place]) [C34.90]  DISCHARGE DIAGNOSIS:  Active Problems:   COPD exacerbation ([place])   Cancer ([place])   Palliative care encounter   SECONDARY DIAGNOSIS:   Past Medical History:  Diagnosis Date  . BPV (benign positional vertigo)   . Chronic constipation   . Diabetes mellitus without complication ([place])   . Hyperlipidemia   . Hypertension     HOSPITAL COURSE:   Acute hypoxic respiratory failure from bronchitis with possible underlying COPD.  IV Solu-Medrol is tapered and bronchodilator treatment and changed to p.o. prednisone tapering dose  Pulmonology is following, okay to discharge patient from pulmonology and oncology standpoint Needs home oxygen will arrange it  *Right moderate pleural effusion, malignant. Thoracentesis done 1 month back and now reaccumulated.  Pulmonology is following, status post thoracentesis on 10/30/2018 Pleural biopsy done -CT-guided by interventional radiology.  Dr. Mike Gip has to follow the pathology results  *Stage IV adenocarcinoma of the right lung.  Patient scheduled for chemotherapy on 11/02/2018 as outpatient. Outpatient follow-up with Dr. Mike Gip as scheduled after discharge Palliative care consulted and Josh PA  has seen the patient  *Diabetes mellitus. Sliding scale insulin ordered.  IV steroids changed to p.o. prednisone, monitor sugars closely which will be high  *Hypertension. Continue home medications  *Adult failure to thrive Palliative care has seen the patient; confirms DNR status and wants to pursue biopsy of the  pulmonary nodule as recommended by [facility] Patient would like to continue treatments as long as he could maintain an acceptable quality of life and treatments  *Constipation- Continue senna from home. Added MiraLAX   *Generalized weakness PT evaluation  ordered but patient and his wife are refusing physical therapy they want to go home  *DVT prophylaxis. SCDs ordered. No Lovenox secondary to intermittent hemoptysis and also need for thoracentesis or Pleurx catheter  DISCHARGE CONDITIONS:   Fair   CONSULTS OBTAINED:  Treatment Team:  Ottie Glazier, MD   PROCEDURES pleural biopsy 10/31/2018 Thoracentesis 10/30/2018  DRUG ALLERGIES:   Allergies  Allergen Reactions  . Accupril [Quinapril Hcl] Other (See Comments)    Reaction:dizziness per MR    DISCHARGE MEDICATIONS:   Allergies as of 10/31/2018      Reactions   Accupril [quinapril Hcl] Other (See Comments)   Reaction:dizziness per MR      Medication List    TAKE these medications   acetaminophen 325 MG tablet Commonly known as: TYLENOL Take 2 tablets (650 mg total) by mouth every 6 (six) hours as needed for mild pain (or Fever >/= 101).   amLODipine 5 MG tablet Commonly known as: NORVASC Take 5 mg by mouth daily.   aspirin 81 MG chewable tablet Chew 81 mg by mouth daily.   citalopram 10 MG tablet Commonly known as: CELEXA Take 1 tablet by mouth daily.   feeding supplement (ENSURE ENLIVE) Liqd Take 237 mLs by mouth 3 (three) times daily between meals.   folic acid 1 MG tablet Commonly known as: FOLVITE Take 1 tablet (1 mg total) by mouth daily.   metoprolol tartrate 25 MG tablet Commonly known as: LOPRESSOR Take 0.5  tablets (12.5 mg total) by mouth 2 (two) times daily.   oxyCODONE-acetaminophen 5-325 MG tablet Commonly known as: PERCOCET/ROXICET Take 1 tablet by mouth every 4 (four) hours as needed for moderate pain.   polyethylene glycol 17 g packet Commonly known as: MIRALAX /  GLYCOLAX Take 17 g by mouth 2 (two) times daily.   potassium chloride 10 MEQ tablet Commonly known as: KLOR-CON Take 10 mEq by mouth 2 (two) times daily.   pravastatin 40 MG tablet Commonly known as: PRAVACHOL Take 40 mg by mouth daily.   predniSONE 10 MG (21) Tbpk tablet Commonly known as: STERAPRED UNI-PAK 21 TAB Take 1 tablet (10 mg total) by mouth daily. Take 6 tablets by mouth for 1 day followed by  5 tablets by mouth for 1 day followed by  4 tablets by mouth for 1 day followed by  3 tablets by mouth for 1 day followed by  2 tablets by mouth for 1 day followed by  1 tablet by mouth for a day and stop   senna 8.6 MG Tabs tablet Commonly known as: SENOKOT Take 1 tablet (8.6 mg total) by mouth daily as needed for mild constipation.   triamterene-hydrochlorothiazide 37.5-25 MG tablet Commonly known as: MAXZIDE-25 Take 1 tablet by mouth daily.        DISCHARGE INSTRUCTIONS:  Follow-up with primary care physician in 2 to 3 days Follow-up with oncology Dr. Mike Gip as recommended or in 1 week Home oxygen   DIET:  Cardiac diet and Diabetic diet  DISCHARGE CONDITION:  Fair  ACTIVITY:  Activity as tolerated and no driving for today  OXYGEN:  Home Oxygen: Yes.     Oxygen Delivery: 2-3  liters/min via Patient connected to nasal cannula oxygen  DISCHARGE LOCATION:  home   If you experience worsening of your admission symptoms, develop shortness of breath, life threatening emergency, suicidal or homicidal thoughts you must seek medical attention immediately by calling 911 or calling your MD immediately  if symptoms less severe.  You Must read complete instructions/literature along with all the possible adverse reactions/side effects for all the Medicines you take and that have been prescribed to you. Take any new Medicines after you have completely understood and accpet all the possible adverse reactions/side effects.   Please note  You were cared for by a  hospitalist during your hospital stay. If you have any questions about your discharge medications or the care you received while you were in the hospital after you are discharged, you can call the unit and asked to speak with the hospitalist on call if the hospitalist that took care of you is not available. Once you are discharged, your primary care physician will handle any further medical issues. Please note that NO REFILLS for any discharge medications will be authorized once you are discharged, as it is imperative that you return to your primary care physician (or establish a relationship with a primary care physician if you do not have one) for your aftercare needs so that they can reassess your need for medications and monitor your lab values.     Today  Chief Complaint  Patient presents with  . Shortness of Breath  . Abdominal Pain    Patient had a biopsy done today and wants to go home, feeling better today Wife feels patient will be better off at home  ROS:  CONSTITUTIONAL: Denies fevers, chills. Denies any fatigue, weakness.  EYES: Denies blurry vision, double vision, eye pain. EARS, NOSE, THROAT: Denies tinnitus,  ear pain, hearing loss. RESPIRATORY: Denies cough, wheeze, improved shortness of breath after thoracentesis.  CARDIOVASCULAR: Denies chest pain, palpitations, edema.  GASTROINTESTINAL: Denies nausea, vomiting, diarrhea, abdominal pain. Denies bright red blood per rectum. GENITOURINARY: Denies dysuria, hematuria. ENDOCRINE: Denies nocturia or thyroid problems. HEMATOLOGIC AND LYMPHATIC: Denies easy bruising or bleeding. SKIN: Denies rash or lesion. MUSCULOSKELETAL: Denies pain in neck, back, shoulder, knees, hips or arthritic symptoms.  NEUROLOGIC: Denies paralysis, paresthesias.  PSYCHIATRIC: Denies anxiety or depressive symptoms.   VITAL SIGNS:  Blood pressure (!) 103/58, pulse 69, temperature 97.6 F (36.4 C), resp. rate 17, height 5\' 6"  (1.676 m), weight 66.2  kg, SpO2 95 %.  I/O:    Intake/Output Summary (Last 24 hours) at 10/31/2018 1528 Last data filed at 10/30/2018 1700 Gross per 24 hour  Intake 100 ml  Output -  Net 100 ml    PHYSICAL EXAMINATION:  GENERAL:  85 y.o.-year-old patient lying in the bed with no acute distress.  EYES: Pupils equal, round, reactive to light and accommodation. No scleral icterus. Extraocular muscles intact.  HEENT: Head atraumatic, normocephalic. Oropharynx and nasopharynx clear.  NECK:  Supple, no jugular venous distention. No thyroid enlargement, no tenderness.  LUNGS: Moderate breath sounds bilaterally, no wheezing, rales,rhonchi or crepitation. No use of accessory muscles of respiration.  CARDIOVASCULAR: S1, S2 normal. No murmurs, rubs, or gallops.  ABDOMEN: Soft, non-tender, non-distended. Bowel sounds present. No organomegaly or mass.  EXTREMITIES: No pedal edema, cyanosis, or clubbing.  NEUROLOGIC: Cranial nerves II through XII are intact.. Sensation intact. Gait not checked.  PSYCHIATRIC: The patient is alert and oriented x 3.  SKIN: No obvious rash, lesion, or ulcer.   DATA REVIEW:   CBC Recent Labs  Lab 10/29/18 0416  WBC 7.4  HGB 12.2*  HCT 36.7*  PLT 164    Chemistries  Recent Labs  Lab 10/28/18 0813 10/29/18 0416  NA 135 133*  K 3.7 3.7  CL 93* 94*  CO2 29 29  GLUCOSE 152* 194*  BUN 21 25*  CREATININE 0.98 0.91  CALCIUM 9.6 9.2  AST 17  --   ALT 20  --   ALKPHOS 61  --   BILITOT 0.8  --     Cardiac Enzymes No results for input(s): TROPONINI in the last 168 hours.  Microbiology Results  Results for orders placed or performed during the hospital encounter of 10/28/18  SARS Coronavirus 2 [facility] order, Performed in [facility] hospital lab) Nasopharyngeal Nasopharyngeal Swab     Status: None   Collection Time: 10/28/18 11:47 AM   Specimen: Nasopharyngeal Swab  Result Value Ref Range Status   SARS Coronavirus 2 NEGATIVE NEGATIVE Final    Comment: (NOTE) If result  is NEGATIVE SARS-CoV-2 target nucleic acids are NOT DETECTED. The SARS-CoV-2 RNA is generally detectable in upper and lower  respiratory specimens during the acute phase of infection. The lowest  concentration of SARS-CoV-2 viral copies this assay can detect is 250  copies / mL. A negative result does not preclude SARS-CoV-2 infection  and should not be used as the sole basis for treatment or other  patient management decisions.  A negative result may occur with  improper specimen collection / handling, submission of specimen other  than nasopharyngeal swab, presence of viral mutation(s) within the  areas targeted by this assay, and inadequate number of viral copies  (<250 copies / mL). A negative result must be combined with clinical  observations, patient history, and epidemiological information. If result is POSITIVE SARS-CoV-2  target nucleic acids are DETECTED. The SARS-CoV-2 RNA is generally detectable in upper and lower  respiratory specimens dur ing the acute phase of infection.  Positive  results are indicative of active infection with SARS-CoV-2.  Clinical  correlation with patient history and other diagnostic information is  necessary to determine patient infection status.  Positive results do  not rule out bacterial infection or co-infection with other viruses. If result is PRESUMPTIVE POSTIVE SARS-CoV-2 nucleic acids MAY BE PRESENT.   A presumptive positive result was obtained on the submitted specimen  and confirmed on repeat testing.  While 2019 novel coronavirus  (SARS-CoV-2) nucleic acids may be present in the submitted sample  additional confirmatory testing may be necessary for epidemiological  and / or clinical management purposes  to differentiate between  SARS-CoV-2 and other Sarbecovirus currently known to infect humans.  If clinically indicated additional testing with an alternate test  methodology [phone]) is advised. The SARS-CoV-2 RNA is generally   detectable in upper and lower respiratory sp ecimens during the acute  phase of infection. The expected result is Negative. Fact Sheet for Patients:  StrictlyIdeas.no Fact Sheet for Healthcare Providers: BankingDealers.co.za This test is not yet approved or cleared by the [place] FDA and has been authorized for detection and/or diagnosis of SARS-CoV-2 by FDA under an Emergency Use Authorization (EUA).  This EUA will remain in effect (meaning this test can be used) for the duration of the COVID-19 declaration under Section 564(b)(1) of the Act, 21 U.S.C. section 360bbb-3(b)(1), unless the authorization is terminated or revoked sooner. Performed at [facility], [address] [place], [place] [address]     RADIOLOGY:  Dg Chest 1 View  Result Date: 10/30/2018 CLINICAL DATA:  Status post right thoracentesis EXAM: CHEST  1 VIEW COMPARISON:  10/29/2018 FINDINGS: Cardiac shadow is stable. Aortic calcifications are again seen. There is been near complete elimination of the right-sided pleural effusion following thoracentesis. No pneumothorax is noted. Pleural nodularity is noted consistent with localized metastatic disease similar to that seen on prior PET-CT. The known right lower lobe mass lesion is again seen. Left lung is hyperinflated but clear. IMPRESSION: No pneumothorax following thoracentesis. Changes consistent with known right lower lobe mass and pleural metastatic disease. Electronically Signed   By: Inez Catalina M.D.   On: 10/30/2018 13:40   Ct Biopsy  Result Date: 10/31/2018 INDICATION: Right lower lobe lung mass tissue is needed for genetic testing for therapy planning EXAM: CT-GUIDED BIOPSY OF A RIGHT LOWER LOBE LUNG MASS MEDICATIONS: None. ANESTHESIA/SEDATION: Moderate (conscious) sedation was employed during this procedure. A total of Versed 1 mg and Fentanyl 25 mcg was administered intravenously. Moderate Sedation  Time: 24 minutes. The patient's level of consciousness and vital signs were monitored continuously by radiology nursing throughout the procedure under my direct supervision. FLUOROSCOPY TIME:  Not applicable COMPLICATIONS: None immediate. PROCEDURE: Informed written consent was obtained from the patient after a thorough discussion of the procedural risks, benefits and alternatives. All questions were addressed. Maximal Sterile Barrier Technique was utilized including caps, mask, sterile gowns, sterile gloves, sterile drape, hand hygiene and skin antiseptic. A timeout was performed prior to the initiation of the procedure. Utilizing 1% xylocaine as local anesthetic and CT fluoroscopic guidance a 17 gauge guiding needle was placed percutaneously into the right lower lobe within the known right lower lobe adenocarcinoma. The needle was placed in an area demonstrating PET avidity previously to allow for maximal viable tissue. Multiple 18 gauge core biopsies were then  obtained without difficulty. The guiding needle was then removed and the tract embolized with Gelfoam slurry to aid in prevention of pneumothorax. Follow-up scanning shows no pneumothorax. The patient tolerated the procedure well and was returned his room in satisfactory condition. IMPRESSION: Successful CT-guided biopsy of a right lower lobe lung mass. The lung mass was chosen as the pleural lesions were not readily accessible. Electronically Signed   By: Inez Catalina M.D.   On: 10/31/2018 14:46   Dg Chest Port 1 View  Result Date: 10/29/2018 CLINICAL DATA:  85 year old male with history of shortness of breath. Stage IV lung cancer. Follow-up study. EXAM: PORTABLE CHEST 1 VIEW COMPARISON:  Chest x-ray 10/27/2018. FINDINGS: Opacity at the right base compatible with known mass. There is adjacent atelectasis and/or consolidation, as well as a moderate right pleural effusion. Irregularity along the lateral aspect of the right hemithorax compatible with  loculated areas of pleural fluid and/or pleural nodularity, better demonstrated on prior PET-CT 10/18/2018. Left lung is clear. No left pleural effusion. No evidence of pulmonary edema. Heart size is normal. Upper mediastinal contours are within normal limits. Aortic atherosclerosis. IMPRESSION: 1. Allowing for differences in patient positioning, the radiographic appearance the chest is very similar to the prior study, as above. Electronically Signed   By: Vinnie Langton M.D.   On: 10/29/2018 18:50   US Thoracentesis Asp Pleural Space W/img Guide  Result Date: 10/30/2018 INDICATION: Recurrent right-sided effusion EXAM: ULTRASOUND GUIDED RIGHT THORACENTESIS MEDICATIONS: None. COMPLICATIONS: None immediate. PROCEDURE: An ultrasound guided thoracentesis was thoroughly discussed with the patient and questions answered. The benefits, risks, alternatives and complications were also discussed. The patient understands and wishes to proceed with the procedure. Written consent was obtained. Ultrasound was performed to localize and mark an adequate pocket of fluid in the right chest. The area was then prepped and draped in the normal sterile fashion. 1% Lidocaine was used for local anesthesia. Under ultrasound guidance a 6 Fr Safe-T-Centesis catheter was introduced. Thoracentesis was performed. The catheter was removed and a dressing applied. FINDINGS: A total of approximately 1 L of clear yellow fluid was removed. Samples were sent to the laboratory as requested by the clinical team. IMPRESSION: Successful ultrasound guided right thoracentesis yielding 1 L of pleural fluid. Electronically Signed   By: Inez Catalina M.D.   On: 10/30/2018 13:37    EKG:   Orders placed or performed during the hospital encounter of 10/28/18  . EKG 12-Lead  . EKG 12-Lead      Management plans discussed with the patient, family and they are in agreement.  CODE STATUS:     Code Status Orders  (From admission, onward)          Start     Ordered   10/28/18 1319  Do not attempt resuscitation (DNR)  Continuous    Question Answer Comment  In the event of cardiac or respiratory ARREST Do not call a "code blue"   In the event of cardiac or respiratory ARREST Do not perform Intubation, CPR, defibrillation or ACLS   In the event of cardiac or respiratory ARREST Use medication by any route, position, wound care, and other measures to relive pain and suffering. May use oxygen, suction and manual treatment of airway obstruction as needed for comfort.      10/28/18 1322        Code Status History    Date Active Date Inactive Code Status Order ID Comments User Context   10/06/2018 0125 10/08/2018 1823 DNR [id number]  Harrie Foreman, MD Inpatient   10/05/2018 2354 10/06/2018 0125 Full Code [id number]  Henreitta Leber, MD Inpatient   Advance Care Planning Activity    Advance Directive Documentation     Most Recent Value  Type of Advance Directive  Healthcare Power of [place], Living will  Pre-existing out of facility DNR order (yellow form or pink MOST form)  -  "MOST" Form in Place?  -      TOTAL TIME TAKING CARE OF THIS PATIENT: 45  minutes.   Note: This dictation was prepared with Dragon dictation along with smaller phrase technology. Any transcriptional errors that result from this process are unintentional.   @[facility] @  on 10/31/2018 at 3:28 PM  Between 7am to 6pm - Pager - [phone]  After 6pm go to [email or web address] - password [email or web address] [facility]  [facility]  Office  [phone]  CC: Primary care physician; [name], MD

## 2018-10-31 NOTE — Progress Notes (Signed)
Pulmonary Medicine          Date: 10/31/2018,   MRN# [id number] Andres Hebert [facility] [birth year]     AdmissionWeight: 72.6 kg                 CurrentWeight: 66.2 kg      CHIEF COMPLAINT:   Shortness of breath due to right-sided pleural effusion   SUBJECTIVE    Patient is mildly improved. For CT guided biopsy today  From pulmonary perspective patient is overall weak and chronically ill appearing.    Discussed with wife today, she states patient has been through so much and she wishes that we can just make patient comfortable.  She specifically mentioned that his dementia is complicating things and seems that overall patient is doing poorly.  I have asked her to discuss these concerns with remainder of her providers as it seems very reasonable. Palliative care is involved.   PAST MEDICAL HISTORY   Past Medical History:  Diagnosis Date   BPV (benign positional vertigo)    Chronic constipation    Diabetes mellitus without complication ([place])    Hyperlipidemia    Hypertension      SURGICAL HISTORY   History reviewed. No pertinent surgical history.   FAMILY HISTORY   Family History  Problem Relation Age of Onset   Stroke Mother    Prostate cancer Father      SOCIAL HISTORY   Social History   Tobacco Use   Smoking status: Former Smoker    Packs/day: 1.00    Years: 25.00    Pack years: 25.00    Types: Cigarettes    Quit date: 1976    Years since quitting: 44.7   Smokeless tobacco: Never Used  Substance Use Topics   Alcohol use: Not Currently   Drug use: Never     MEDICATIONS    Home Medication:    Current Medication:  Current Facility-Administered Medications:    acetaminophen (TYLENOL) tablet 650 mg, 650 mg, Oral, Q6H PRN **OR** acetaminophen (TYLENOL) suppository 650 mg, 650 mg, Rectal, Q6H PRN, Sudini, Srikar, MD   albuterol (PROVENTIL) (2.5 MG/3ML) 0.083% nebulizer solution 2.5 mg, 2.5 mg, Nebulization, Q4H PRN, Gouru,  Aruna, MD, 2.5 mg at 10/30/18 0404   amLODipine (NORVASC) tablet 5 mg, 5 mg, Oral, Daily, Sudini, Srikar, MD, 5 mg at 10/30/18 6568   bisacodyl (DULCOLAX) suppository 10 mg, 10 mg, Rectal, Daily PRN, Hillary Bow, MD, 10 mg at 10/29/18 1108   citalopram (CELEXA) tablet 10 mg, 10 mg, Oral, Daily, Sudini, Srikar, MD, 10 mg at 10/30/18 0908   fentaNYL (SUBLIMAZE) 100 MCG/2ML injection, , , ,    folic acid (FOLVITE) tablet 1 mg, 1 mg, Oral, Daily, Sudini, Srikar, MD, 1 mg at 10/30/18 0907   haloperidol lactate (HALDOL) injection 0.5 mg, 0.5 mg, Intravenous, Q12H PRN, Gouru, Aruna, MD, 0.5 mg at 10/29/18 1147   insulin aspart (novoLOG) injection 0-15 Units, 0-15 Units, Subcutaneous, TID WC, Sudini, Srikar, MD, Stopped at 10/31/18 1250   insulin aspart (novoLOG) injection 0-5 Units, 0-5 Units, Subcutaneous, QHS, Sudini, Srikar, MD, 2 Units at 10/30/18 2105   magnesium hydroxide (MILK OF MAGNESIA) suspension 30 mL, 30 mL, Oral, Daily PRN, Hillary Bow, MD, 30 mL at 10/29/18 0833   [START ON 11/01/2018] methylPREDNISolone sodium succinate (SOLU-MEDROL) 40 mg/mL injection 40 mg, 40 mg, Intravenous, Q24H, Gouru, Aruna, MD   metoprolol tartrate (LOPRESSOR) tablet 25 mg, 25 mg, Oral, BID, Sudini, Srikar, MD, 25 mg at 10/30/18 2106  midazolam (VERSED) 5 MG/5ML injection, , , ,    ondansetron (ZOFRAN) tablet 4 mg, 4 mg, Oral, Q6H PRN **OR** ondansetron (ZOFRAN) injection 4 mg, 4 mg, Intravenous, Q6H PRN, Sudini, Srikar, MD   oxyCODONE-acetaminophen (PERCOCET) 7.5-325 MG per tablet 1 tablet, 1 tablet, Oral, Q4H PRN, Hillary Bow, MD, 1 tablet at 10/31/18 0920   polyethylene glycol (MIRALAX / GLYCOLAX) packet 17 g, 17 g, Oral, BID, Sudini, Srikar, MD, 17 g at 10/30/18 2106   potassium chloride SA (KLOR-CON) CR tablet 10 mEq, 10 mEq, Oral, BID, Sudini, Srikar, MD, 10 mEq at 10/30/18 2106   pravastatin (PRAVACHOL) tablet 40 mg, 40 mg, Oral, Daily, Sudini, Srikar, MD, 40 mg at 10/30/18 1724    protein supplement (ENSURE MAX) liquid, 11 oz, Oral, BID BM, Gouru, Aruna, MD, 11 oz at 10/30/18 1353   senna (SENOKOT) tablet 8.6 mg, 1 tablet, Oral, BID, Sudini, Srikar, MD, 8.6 mg at 10/30/18 2106   triamterene-hydrochlorothiazide (MAXZIDE-25) 37.5-25 MG per tablet 1 tablet, 1 tablet, Oral, Daily, Sudini, Srikar, MD, 1 tablet at 10/30/18 0908    ALLERGIES   Accupril [quinapril hcl]     REVIEW OF SYSTEMS    Review of Systems:  Gen:  Denies  fever, sweats, chills weigh loss  HEENT: Denies blurred vision, double vision, ear pain, eye pain, hearing loss, nose bleeds, sore throat Cardiac:  No dizziness, chest pain or heaviness, chest tightness,edema Resp:   Denies cough or sputum porduction, shortness of breath,wheezing, hemoptysis,  Gi: Denies swallowing difficulty, stomach pain, nausea or vomiting, diarrhea, constipation, bowel incontinence Gu:  Denies bladder incontinence, burning urine Ext:   Denies Joint pain, stiffness or swelling Skin: Denies  skin rash, easy bruising or bleeding or hives Endoc:  Denies polyuria, polydipsia , polyphagia or weight change Psych:   Denies depression, insomnia or hallucinations   Other:  All other systems negative   VS: BP 122/72    Pulse 74    Temp 97.6 F (36.4 C)    Resp 17    Ht 5\' 6"  (1.676 m)    Wt 66.2 kg    SpO2 96%    BMI 23.55 kg/m      PHYSICAL EXAM    GENERAL:NAD, no fevers, chills, no weakness no fatigue HEAD: Normocephalic, atraumatic.  EYES: Pupils equal, round, reactive to light. Extraocular muscles intact. No scleral icterus.  MOUTH: Moist mucosal membrane. Dentition intact. No abscess noted.  EAR, NOSE, THROAT: Clear without exudates. No external lesions.  NECK: Supple. No thyromegaly. No nodules. No JVD.  PULMONARY: Decreased breath sounds on right CARDIOVASCULAR: S1 and S2. Regular rate and rhythm. No murmurs, rubs, or gallops. No edema. Pedal pulses 2+ bilaterally.  GASTROINTESTINAL: Soft, nontender,  nondistended. No masses. Positive bowel sounds. No hepatosplenomegaly.  MUSCULOSKELETAL: No swelling, clubbing, or edema. Range of motion full in all extremities.  NEUROLOGIC: Cranial nerves II through XII are intact. No gross focal neurological deficits. Sensation intact. Reflexes intact.  SKIN: No ulceration, lesions, rashes, or cyanosis. Skin warm and dry. Turgor intact.  PSYCHIATRIC: Mood, affect within normal limits. The patient is awake, alert and oriented x 3. Insight, judgment intact.       IMAGING    Ct Angio Head W Or Wo Contrast  Result Date: 10/08/2018 CLINICAL DATA:  Lung cancer. Balance disturbance. Abnormal flow left vertebral artery seen by MRI. EXAM: CT ANGIOGRAPHY HEAD AND NECK TECHNIQUE: Multidetector CT imaging of the head and neck was performed using the standard protocol during bolus administration of intravenous contrast.  Multiplanar CT image reconstructions and MIPs were obtained to evaluate the vascular anatomy. Carotid stenosis measurements (when applicable) are obtained utilizing NASCET criteria, using the distal internal carotid diameter as the denominator. CONTRAST:  59mL OMNIPAQUE IOHEXOL 350 MG/ML SOLN COMPARISON:  MRI 10/07/2018 FINDINGS: CT HEAD FINDINGS Brain: Mild age related volume loss. Mild chronic appearing small vessel change of the hemispheric white matter. No sign of acute infarction, mass lesion, hemorrhage, hydrocephalus or extra-axial collection. Vascular: Negative noncontrast appearance. Skull: Normal Sinuses: Clear Orbits: Normal Review of the MIP images confirms the above findings CTA NECK FINDINGS Aortic arch: Aortic atherosclerosis. No aneurysm or dissection. Branching pattern shows the congenital variation of a small left vertebral artery arising from the arch. Right carotid system: Common carotid artery widely patent to the bifurcation. Ordinary atherosclerotic plaque at the carotid bifurcation and ICA bulb but no stenosis. Cervical ICA widely patent.  Left carotid system: Common carotid artery widely patent to the bifurcation. Ordinary calcified plaque at the carotid bifurcation and ICA bulb but no stenosis. Cervical ICA widely patent. Vertebral arteries: Dominant left vertebral artery is widely patent at its origin and through the cervical region to the foramen magnum. Non dominant small left vertebral artery arises directly from the arch and is patent through the cervical region to the foramen magnum. Skeleton: Ordinary cervical spondylosis. Other neck: No mass or lymphadenopathy. Upper chest: See results recent chest evaluations. Scarring and emphysema at the lung apices. Layering pleural fluid on the right. Review of the MIP images confirms the above findings CTA HEAD FINDINGS Anterior circulation: Both internal carotid arteries are patent through the skull base and siphon regions. No siphon stenosis. Anterior and middle cerebral vessels are patent without proximal stenosis, aneurysm or vascular malformation. Posterior circulation: Dominant right vertebral artery is widely patent through the foramen magnum to the basilar. Congenital small left vertebral artery is patent through the foramen magnum, supplying left PICA, with a very rudimentary connection to the basilar. No basilar stenosis. Superior cerebellar and posterior cerebral arteries show flow. No stenosis or aneurysm. Venous sinuses: Patent and normal. Anatomic variants: None other significant. Review of the MIP images confirms the above findings IMPRESSION: Ordinary aortic atherosclerosis. Ordinary atherosclerosis at the carotid bifurcations without stenosis. No intracranial anterior circulation finding of significance. Patient has the congenital variation of asymmetric vertebral anatomy. Dominant normal appearing right vertebral artery widely patent to the basilar. Tiny left vertebral artery arising directly from the arch, but patent through the neck, supplying left PICA and with a rudimentary  contribution to the basilar. This is not likely to be of any clinical relevance. Electronically Signed   By: Nelson Chimes M.D.   On: 10/08/2018 10:22   Dg Chest 1 View  Result Date: 10/30/2018 CLINICAL DATA:  Status post right thoracentesis EXAM: CHEST  1 VIEW COMPARISON:  10/29/2018 FINDINGS: Cardiac shadow is stable. Aortic calcifications are again seen. There is been near complete elimination of the right-sided pleural effusion following thoracentesis. No pneumothorax is noted. Pleural nodularity is noted consistent with localized metastatic disease similar to that seen on prior PET-CT. The known right lower lobe mass lesion is again seen. Left lung is hyperinflated but clear. IMPRESSION: No pneumothorax following thoracentesis. Changes consistent with known right lower lobe mass and pleural metastatic disease. Electronically Signed   By: Inez Catalina M.D.   On: 10/30/2018 13:40   Dg Chest 2 View  Result Date: 10/27/2018 CLINICAL DATA:  Right lung malignancy. EXAM: CHEST - 2 VIEW COMPARISON:  Chest radiograph dated 10/24/2018  and CT chest dated 10/05/2018 FINDINGS: A moderate right pleural effusion with associated atelectasis has increased since prior exam. Right lateral nodular pleural thickening is not significantly changed. The left lung is clear. There is no pneumothorax. Emphysematous changes are noted. The cardiomediastinal silhouette is unchanged. Calcifications are seen in the aortic arch. IMPRESSION: Increased moderate right malignant pleural effusion. Aortic Atherosclerosis (ICD10-I70.0) and Emphysema (ICD10-J43.9). Electronically Signed   By: Zerita Boers M.D.   On: 10/27/2018 13:09   Dg Chest 2 View  Result Date: 10/24/2018 CLINICAL DATA:  Assess pleural fluid EXAM: CHEST - 2 VIEW COMPARISON:  10/06/2018, CT chest, 10/05/2018 FINDINGS: Interval increase in a right-sided pleural effusion, now moderate, with associated atelectasis or consolidation and multiple pleural based nodules, better  characterized by prior CT. Emphysema. The heart mediastinum are unremarkable. Disc degenerative disease of the thoracic spine IMPRESSION: 1. Interval increase in a right-sided pleural effusion, now moderate, with associated atelectasis or consolidation and multiple pleural based nodules, better characterized by prior CT. 2.  Emphysema. Electronically Signed   By: Eddie Candle M.D.   On: 10/24/2018 10:53   Dg Chest 2 View  Result Date: 10/05/2018 CLINICAL DATA:  Shortness of breath EXAM: CHEST - 2 VIEW COMPARISON:  None. FINDINGS: Small right pleural effusion with lateral loculated component versus pleural nodularity. The lungs are hyperexpanded with increased interstitial markings. Heart size is normal. There is calcific aortic atherosclerosis. IMPRESSION: Small right pleural effusion with lateral loculated component versus pleural nodularity. In the setting of recent trauma, this may indicate nondisplaced or minimally displaced rib fractures with a small hemothorax. CT may be helpful for further investigation. Electronically Signed   By: Ulyses Jarred M.D.   On: 10/05/2018 19:10   Ct Angio Neck W Or Wo Contrast  Result Date: 10/08/2018 CLINICAL DATA:  Lung cancer. Balance disturbance. Abnormal flow left vertebral artery seen by MRI. EXAM: CT ANGIOGRAPHY HEAD AND NECK TECHNIQUE: Multidetector CT imaging of the head and neck was performed using the standard protocol during bolus administration of intravenous contrast. Multiplanar CT image reconstructions and MIPs were obtained to evaluate the vascular anatomy. Carotid stenosis measurements (when applicable) are obtained utilizing NASCET criteria, using the distal internal carotid diameter as the denominator. CONTRAST:  66mL OMNIPAQUE IOHEXOL 350 MG/ML SOLN COMPARISON:  MRI 10/07/2018 FINDINGS: CT HEAD FINDINGS Brain: Mild age related volume loss. Mild chronic appearing small vessel change of the hemispheric white matter. No sign of acute infarction, mass  lesion, hemorrhage, hydrocephalus or extra-axial collection. Vascular: Negative noncontrast appearance. Skull: Normal Sinuses: Clear Orbits: Normal Review of the MIP images confirms the above findings CTA NECK FINDINGS Aortic arch: Aortic atherosclerosis. No aneurysm or dissection. Branching pattern shows the congenital variation of a small left vertebral artery arising from the arch. Right carotid system: Common carotid artery widely patent to the bifurcation. Ordinary atherosclerotic plaque at the carotid bifurcation and ICA bulb but no stenosis. Cervical ICA widely patent. Left carotid system: Common carotid artery widely patent to the bifurcation. Ordinary calcified plaque at the carotid bifurcation and ICA bulb but no stenosis. Cervical ICA widely patent. Vertebral arteries: Dominant left vertebral artery is widely patent at its origin and through the cervical region to the foramen magnum. Non dominant small left vertebral artery arises directly from the arch and is patent through the cervical region to the foramen magnum. Skeleton: Ordinary cervical spondylosis. Other neck: No mass or lymphadenopathy. Upper chest: See results recent chest evaluations. Scarring and emphysema at the lung apices. Layering pleural fluid on  the right. Review of the MIP images confirms the above findings CTA HEAD FINDINGS Anterior circulation: Both internal carotid arteries are patent through the skull base and siphon regions. No siphon stenosis. Anterior and middle cerebral vessels are patent without proximal stenosis, aneurysm or vascular malformation. Posterior circulation: Dominant right vertebral artery is widely patent through the foramen magnum to the basilar. Congenital small left vertebral artery is patent through the foramen magnum, supplying left PICA, with a very rudimentary connection to the basilar. No basilar stenosis. Superior cerebellar and posterior cerebral arteries show flow. No stenosis or aneurysm. Venous  sinuses: Patent and normal. Anatomic variants: None other significant. Review of the MIP images confirms the above findings IMPRESSION: Ordinary aortic atherosclerosis. Ordinary atherosclerosis at the carotid bifurcations without stenosis. No intracranial anterior circulation finding of significance. Patient has the congenital variation of asymmetric vertebral anatomy. Dominant normal appearing right vertebral artery widely patent to the basilar. Tiny left vertebral artery arising directly from the arch, but patent through the neck, supplying left PICA and with a rudimentary contribution to the basilar. This is not likely to be of any clinical relevance. Electronically Signed   By: Nelson Chimes M.D.   On: 10/08/2018 10:22   Ct Angio Chest Pe W And/or Wo Contrast  Result Date: 10/05/2018 CLINICAL DATA:  Chest pain EXAM: CT ANGIOGRAPHY CHEST WITH CONTRAST TECHNIQUE: Multidetector CT imaging of the chest was performed using the standard protocol during bolus administration of intravenous contrast. Multiplanar CT image reconstructions and MIPs were obtained to evaluate the vascular anatomy. CONTRAST:  63mL OMNIPAQUE IOHEXOL 350 MG/ML SOLN COMPARISON:  Chest x-ray today FINDINGS: Cardiovascular: No filling defects in the pulmonary arteries to suggest pulmonary emboli. Heart is normal size. Aorta is normal caliber. Coronary artery and aortic atherosclerosis. Mediastinum/Nodes: Enlarged right hilar lymph nodes with index node having a short axis diameter of 10 mm. No mediastinal, left hilar or axillary adenopathy. Lungs/Pleura: There is concern for right lower lobe mass measuring approximately 8.4 x 6.9 cm. Associated large right joint effusion with pleural nodules concerning for metastatic pleural involvement. Compressive atelectasis in the right lower lobe. No focal opacity on the left. Moderate to advanced emphysema. Upper Abdomen: Imaging into the upper abdomen shows no acute findings. Musculoskeletal: Destructive  mass noted in the lateral right 7th rib. Bone destruction also in the lateral right 8th rib. Diffuse degenerative changes in the thoracic spine. Review of the MIP images confirms the above findings. IMPRESSION: Large right lower lobe mass measures up to 8.4 cm most compatible with lung cancer. This is associated with numerous pleural nodules, large right pleural effusion compatible with pleural metastatic disease. There also bony destructive lytic lesions in the right lateral 7th and 8th ribs. Moderate to advanced emphysema. No evidence of pulmonary embolus. Aortic Atherosclerosis (ICD10-I70.0) and Emphysema (ICD10-J43.9). Electronically Signed   By: Rolm Baptise M.D.   On: 10/05/2018 21:10   Mr Jeri Cos NF Contrast  Result Date: 10/07/2018 CLINICAL DATA:  85 year old male with large right lung mass. Loss of balance over the past 3-4 months. Staging. EXAM: MRI HEAD WITHOUT AND WITH CONTRAST TECHNIQUE: Multiplanar, multiecho pulse sequences of the brain and surrounding structures were obtained without and with intravenous contrast. CONTRAST:  72mL GADAVIST GADOBUTROL 1 MMOL/ML IV SOLN COMPARISON:  None. FINDINGS: Brain: Mild motion artifact on post-contrast images. No abnormal enhancement identified. No midline shift, mass effect, or evidence of intracranial mass lesion. Cerebral volume is within normal limits for age. No restricted diffusion to suggest acute infarction. No  ventriculomegaly, extra-axial collection or acute intracranial hemorrhage. Cervicomedullary junction and pituitary are within normal limits. Minimal to mild for age scattered nonspecific cerebral white matter T2 and FLAIR hyperintensity. No cortical encephalomalacia or chronic cerebral blood products. There is a possible small chronic subcortical white matter lacune in the left anterior frontal lobe on series 10, image 15. Vascular: Loss of the distal left vertebral artery flow void at the skull base although there does appear to be some left V4  segment collateral flow. Other Major intracranial vascular flow voids are preserved. The major dural venous sinuses are enhancing and appear to be patent. Skull and upper cervical spine: Negative visible cervical spine and spinal cord. Visible bone marrow signal is normal. Sinuses/Orbits: Postoperative changes to the globes, otherwise negative orbits. Paranasal sinuses are clear. Other: Mastoids are clear. Visible internal auditory structures appear normal. Scalp and face soft tissues appear negative. IMPRESSION: 1. No metastatic disease or acute intracranial abnormality identified. 2. Distal left vertebral artery with poor flow or occlusion, most likely due to atherosclerosis. CTA or MRA of the neck and head could evaluate further as necessary. Electronically Signed   By: Genevie Ann M.D.   On: 10/07/2018 16:50   Nm Pet Image Initial (pi) Skull Base To Thigh  Result Date: 10/18/2018 CLINICAL DATA:  Initial treatment strategy for lung adenocarcinoma. RIGHT lower lobe mass. EXAM: NUCLEAR MEDICINE PET SKULL BASE TO THIGH TECHNIQUE: 8.0 mCi F-18 FDG was injected intravenously. Full-ring PET imaging was performed from the skull base to thigh after the radiotracer. CT data was obtained and used for attenuation correction and anatomic localization. Fasting blood glucose: 150 mg/dl COMPARISON:  Chest CT October 05, 2018 FINDINGS: Mediastinal blood pool activity: SUV max 2.64 Liver activity: SUV max NA NECK: No hypermetabolic cervical lymph nodes. Incidental CT findings: none Chest: Hypermetabolic mass in the RIGHT lower lobe measures 7.5 x 5.7 cm with intense metabolic activity (SUV max equal 17.8). There is hypermetabolic pleural thickening in the RIGHT upper lobe and RIGHT lower lobe. For example, RIGHT upper lobe pleural nodule measuring 2.9 x 1.4 cm witth SUV max equal 7.7. There is a moderate RIGHT pleural effusion. Within the fusion is a hypermetabolic pleural nodule SUV max equal 4.9. Approximately 12  hypermetabolic pleural nodules in the RIGHT lung. One of these pleural nodules invades the cortex of the adjacent 7th rib (image 218 of the fused data set). Hypermetabolic RIGHT hilar lymph node. No central hypermetabolic mediastinal lymph nodes. No hypermetabolic supraclavicular adenopathy. Incidental CT findings: none ABDOMEN/PELVIS: Adjacent to the RIGHT adrenal gland is hypermetabolic nodular thickening along the diaphragm. Adrenal glands are normal. No abnormal activity within the liver. No hypermetabolic upper abdominal lymph nodes. No hypermetabolic pelvic lymph nodes. Incidental CT findings: Prostate enlarged. Atherosclerotic calcification of the aorta. SKELETON: Cortical invasion of the RIGHT seventh rib as described in the chest section no additional evidence skeletal metastasis Incidental CT findings: none IMPRESSION: 1. Large hypermetabolic RIGHT lower lobe mass consistent bronchogenic carcinoma. 2. Multifocal pleural metastasis within the RIGHT hemithorax. 3. One pleural metastatic lesion invades adjacent RIGHT seventh rib. 4. Moderate RIGHT effusion. 5. Probable RIGHT hilar metastatic adenopathy. No mediastinal or supraclavicular metastatic adenopathy. Electronically Signed   By: Suzy Bouchard M.D.   On: 10/18/2018 12:28   Dg Chest Port 1 View  Result Date: 10/29/2018 CLINICAL DATA:  85 year old male with history of shortness of breath. Stage IV lung cancer. Follow-up study. EXAM: PORTABLE CHEST 1 VIEW COMPARISON:  Chest x-ray 10/27/2018. FINDINGS: Opacity at the right  base compatible with known mass. There is adjacent atelectasis and/or consolidation, as well as a moderate right pleural effusion. Irregularity along the lateral aspect of the right hemithorax compatible with loculated areas of pleural fluid and/or pleural nodularity, better demonstrated on prior PET-CT 10/18/2018. Left lung is clear. No left pleural effusion. No evidence of pulmonary edema. Heart size is normal. Upper mediastinal  contours are within normal limits. Aortic atherosclerosis. IMPRESSION: 1. Allowing for differences in patient positioning, the radiographic appearance the chest is very similar to the prior study, as above. Electronically Signed   By: Vinnie Langton M.D.   On: 10/29/2018 18:50   Dg Chest Port 1 View  Result Date: 10/06/2018 CLINICAL DATA:  Post right-sided thoracentesis EXAM: PORTABLE CHEST 1 VIEW COMPARISON:  Chest radiograph-10/05/2018; chest CT-10/05/2018 FINDINGS: Grossly unchanged cardiac silhouette and mediastinal contours with atherosclerotic plaque within the thoracic aorta. Interval reduction/near resolution of right-sided pleural effusion post thoracentesis. No pneumothorax. Improved aeration of the right lung base with persistent masslike consolidative opacities and partial atelectasis/collapse. There is persistent pleuroparenchymal thickening about the peripheral aspect of the right mid lung. The left hemithorax remains well aerated. Similar findings of lung hyperexpansion. No evidence of edema. No acute osseous abnormalities. Degenerative change the right glenohumeral joint. IMPRESSION: 1. Interval reduction/near resolution of right-sided pleural effusion post thoracentesis. No pneumothorax. 2. Improved aeration of the right lower lung with persistent masslike consolidative opacities and partial atelectasis/collapse. 3. Grossly unchanged pleuroparenchymal thickening along the right lateral chest wall compatible with suspected pleural metastasis demonstrated on preceding chest CT. 4. Aortic Atherosclerosis (ICD10-I70.0) and Emphysema (ICD10-J43.9). Electronically Signed   By: Sandi Mariscal M.D.   On: 10/06/2018 10:04   Dg Abd 2 Views  Result Date: 10/27/2018 CLINICAL DATA:  Abdominal pain EXAM: ABDOMEN - 2 VIEW COMPARISON:  CT abdomen pelvis dated 09/16/2012. PET/CT dated 10/18/2018. FINDINGS: The bowel gas pattern is nonobstructive. There is no evidence of free air. No radio-opaque calculi or  other significant radiographic abnormality is seen. A right pleural effusion with associated mass representing the patient's known malignancy is partially imaged. IMPRESSION: Nonobstructive bowel gas pattern. Electronically Signed   By: Zerita Boers M.D.   On: 10/27/2018 13:06   US Thoracentesis Asp Pleural Space W/img Guide  Result Date: 10/30/2018 INDICATION: Recurrent right-sided effusion EXAM: ULTRASOUND GUIDED RIGHT THORACENTESIS MEDICATIONS: None. COMPLICATIONS: None immediate. PROCEDURE: An ultrasound guided thoracentesis was thoroughly discussed with the patient and questions answered. The benefits, risks, alternatives and complications were also discussed. The patient understands and wishes to proceed with the procedure. Written consent was obtained. Ultrasound was performed to localize and mark an adequate pocket of fluid in the right chest. The area was then prepped and draped in the normal sterile fashion. 1% Lidocaine was used for local anesthesia. Under ultrasound guidance a 6 Fr Safe-T-Centesis catheter was introduced. Thoracentesis was performed. The catheter was removed and a dressing applied. FINDINGS: A total of approximately 1 L of clear yellow fluid was removed. Samples were sent to the laboratory as requested by the clinical team. IMPRESSION: Successful ultrasound guided right thoracentesis yielding 1 L of pleural fluid. Electronically Signed   By: Inez Catalina M.D.   On: 10/30/2018 13:37   US Thoracentesis Asp Pleural Space W/img Guide  Result Date: 10/06/2018 INDICATION: No known primary, now with concern for metastatic lung cancer with symptomatic right-sided pleural effusion. Please perform ultrasound-guided thoracentesis for diagnostic and therapeutic purposes. EXAM: US THORACENTESIS ASP PLEURAL SPACE W/IMG GUIDE COMPARISON:  Chest radiograph-10/05/2018; chest CT-10/05/2018 MEDICATIONS: None.  COMPLICATIONS: None immediate. TECHNIQUE: Informed written consent was obtained from the  patient after a discussion of the risks, benefits and alternatives to treatment. A timeout was performed prior to the initiation of the procedure. Initial ultrasound scanning demonstrates a moderate sized right-sided anechoic pleural effusion. The lower chest was prepped and draped in the usual sterile fashion. 1% lidocaine was used for local anesthesia. An ultrasound image was saved for documentation purposes. An 8 Fr Safe-T-Centesis catheter was introduced. The thoracentesis was performed. The catheter was removed and a dressing was applied. The patient tolerated the procedure well without immediate post procedural complication. The patient was escorted to have an upright chest radiograph. FINDINGS: A total of approximately 1.2 liters of serous fluid was removed. Requested samples were sent to the laboratory. IMPRESSION: Successful ultrasound-guided right sided thoracentesis yielding 1.2 liters of pleural fluid. Electronically Signed   By: Sandi Mariscal M.D.   On: 10/06/2018 12:19                                ASSESSMENT/PLAN   Shortness of breath due to worsening right pleural effusion -Discussed current findings with wife today. .  CXR reviewed with significant improvement.   -Cleared from pulm for d/c home if appropriate from primary team.  -Currently undergoing work-up with Dr. Mike Gip oncology -Recommend tunneled pleural catheter for palliation of recurrent malignant effusion -wife wants to discuss comfort measures.     Thank you for allowing me to participate in the care of this patient.     Patient/Family are satisfied with care plan and all questions have been answered.  This document was prepared using Dragon voice recognition software and may include unintentional dictation errors.     Ottie Glazier, M.D.  Division of [facility]

## 2018-10-31 NOTE — TOC Progression Note (Addendum)
Transition of Care [facility]) - Progression Note    Patient Details  Name: Andres Hebert MRN: [id number] Date of Birth: 1931-11-18  Transition of [facility]) CM/SW Contact  Genevive Printup, Lenice Llamas Phone Number: [phone]  10/31/2018, 4:08 PM  Clinical Narrative: Per MD patient's wife Andres Hebert wants patient to D/C home today after his biopsy. MD ordered home health PT, RN and social worker. Clinical Education officer, museum (CSW) notified Lauretta Chester home health representative that patient will D/C home today. Per MD patient may need oxygen. CSW made MD aware that patient needs qualifying sats prior to oxygen being delivered. [employer] agency representative will likely not be able to deliver oxygen today due to the fact that it is late in the afternoon and patient is off the floor now and oxygen sats have not been completed. CSW met with patient's wife Andres Hebert and aware of above. Per wife she hopes that patient can go home today without oxygen and understands that if he needs oxygen he will have to wait to D/C tomorrow. RN aware of above.      5 pm: Per RN patient does qualify for oxygen. [employer] agency representative is aware of above. Per Leroy Sea he is going to coordinate the oxygen delievery. Per Leroy Sea it can be delivered to the hospital room between 7 pm and 9 pm tonight or it can be delivered to patient's house and his wife or another family member can bring a portable tank to [facility]. Per Leroy Sea he will follow up with patient's wife about quickest way the oxygen can be delivered. Patient and his wife Andres Hebert are aware of above. Brad aware of above. RN aware aware of above.     Expected Discharge Plan: [place] Barriers to Discharge: Continued Medical Work up  Expected Discharge Plan and Services Expected Discharge Plan: [place] In-house Referral: Clinical Social Work     Living arrangements for the past 2 months: [place] Expected Discharge  Date: 10/31/18               DME Arranged: N/A         HH Arranged: RN, PT HH Agency: [facility] Date [place]: 10/31/18 Time [place]: 1205 Representative spoke with at [place]: [place] ([place]) Interventions    Readmission Risk Interventions Readmission Risk Prevention Plan 10/08/2018  Post Dischage Appt Complete  Medication Screening Complete  Transportation Screening Complete  Some recent data might be hidden

## 2018-10-31 NOTE — Progress Notes (Signed)
CXR reviewed per Dr. Golden Circle. No additional orders at this time. Ok to send to floor and Dr. Golden Circle ok with d/c per Primary MD.

## 2018-10-31 NOTE — Progress Notes (Signed)
[place] [facility] [place] NAME: Andres Hebert    MR#:  [id number]  DATE OF BIRTH:  1931-09-11  SUBJECTIVE:  CHIEF COMPLAINT: Patient is status post thoracentesis on 10/30/2018 Feeling better  patient is pleasantly confused, and according to the wife patient has baseline delirium even at home, waiting for neurology consult outpatient as they were worried about dementia  Wife at bedside  REVIEW OF SYSTEMS:  Review of system limited   CONSTITUTIONAL: No fever, fatigue or weakness.  RESPIRATORY: No cough, improved shortness of breath, denies wheezing or hemoptysis.  CARDIOVASCULAR: No chest pain, orthopnea, edema.  GASTROINTESTINAL: No nausea, vomiting, diarrhea or abdominal pain.  SKIN: No rash or lesion. MUSCULOSKELETAL: No joint pain or arthritis.   NEUROLOGIC: No tingling, numbness, weakness.  PSYCHIATRY: No anxiety or depression.   DRUG ALLERGIES:   Allergies  Allergen Reactions  . Accupril [Quinapril Hcl] Other (See Comments)    Reaction:dizziness per MR    VITALS:  Blood pressure 136/70, pulse 93, temperature 97.6 F (36.4 C), resp. rate 19, height 5\' 6"  (1.676 m), weight 66.2 kg, SpO2 92 %.  PHYSICAL EXAMINATION:  GENERAL:  85 y.o.-year-old patient lying in the bed with no acute distress.  EYES: Pupils equal, round, reactive to light and accommodation. No scleral icterus. Extraocular muscles intact.  HEENT: Head atraumatic, normocephalic. Oropharynx and nasopharynx clear.  NECK:  Supple, no jugular venous distention. No thyroid enlargement, no tenderness.  LUNGS: Moderate breath sounds bilaterally, no wheezing, rales,rhonchi or crepitation. No use of accessory muscles of respiration.  CARDIOVASCULAR: S1, S2 normal. No murmurs, rubs, or gallops.  ABDOMEN: Soft, nontender, nondistended. Bowel sounds present. No organomegaly or mass.  EXTREMITIES: No pedal edema, cyanosis, or clubbing.  NEUROLOGIC: Awake, alert and oriented  x2-3. Sensation intact. Gait not checked.  PSYCHIATRIC: The patient is alert and oriented x 2-3.  SKIN: No obvious rash, lesion, or ulcer.    LABORATORY PANEL:   CBC Recent Labs  Lab 10/29/18 0416  WBC 7.4  HGB 12.2*  HCT 36.7*  PLT 164   ------------------------------------------------------------------------------------------------------------------  Chemistries  Recent Labs  Lab 10/28/18 0813 10/29/18 0416  NA 135 133*  K 3.7 3.7  CL 93* 94*  CO2 29 29  GLUCOSE 152* 194*  BUN 21 25*  CREATININE 0.98 0.91  CALCIUM 9.6 9.2  AST 17  --   ALT 20  --   ALKPHOS 61  --   BILITOT 0.8  --    ------------------------------------------------------------------------------------------------------------------  Cardiac Enzymes No results for input(s): TROPONINI in the last 168 hours. ------------------------------------------------------------------------------------------------------------------  RADIOLOGY:  Dg Chest 1 View  Result Date: 10/30/2018 CLINICAL DATA:  Status post right thoracentesis EXAM: CHEST  1 VIEW COMPARISON:  10/29/2018 FINDINGS: Cardiac shadow is stable. Aortic calcifications are again seen. There is been near complete elimination of the right-sided pleural effusion following thoracentesis. No pneumothorax is noted. Pleural nodularity is noted consistent with localized metastatic disease similar to that seen on prior PET-CT. The known right lower lobe mass lesion is again seen. Left lung is hyperinflated but clear. IMPRESSION: No pneumothorax following thoracentesis. Changes consistent with known right lower lobe mass and pleural metastatic disease. Electronically Signed   By: Inez Catalina M.D.   On: 10/30/2018 13:40   Dg Chest Port 1 View  Result Date: 10/29/2018 CLINICAL DATA:  85 year old male with history of shortness of breath. Stage IV lung cancer. Follow-up study. EXAM: PORTABLE CHEST 1 VIEW COMPARISON:  Chest x-ray 10/27/2018. FINDINGS: Opacity at  the  right base compatible with known mass. There is adjacent atelectasis and/or consolidation, as well as a moderate right pleural effusion. Irregularity along the lateral aspect of the right hemithorax compatible with loculated areas of pleural fluid and/or pleural nodularity, better demonstrated on prior PET-CT 10/18/2018. Left lung is clear. No left pleural effusion. No evidence of pulmonary edema. Heart size is normal. Upper mediastinal contours are within normal limits. Aortic atherosclerosis. IMPRESSION: 1. Allowing for differences in patient positioning, the radiographic appearance the chest is very similar to the prior study, as above. Electronically Signed   By: Vinnie Langton M.D.   On: 10/29/2018 18:50   US Thoracentesis Asp Pleural Space W/img Guide  Result Date: 10/30/2018 INDICATION: Recurrent right-sided effusion EXAM: ULTRASOUND GUIDED RIGHT THORACENTESIS MEDICATIONS: None. COMPLICATIONS: None immediate. PROCEDURE: An ultrasound guided thoracentesis was thoroughly discussed with the patient and questions answered. The benefits, risks, alternatives and complications were also discussed. The patient understands and wishes to proceed with the procedure. Written consent was obtained. Ultrasound was performed to localize and mark an adequate pocket of fluid in the right chest. The area was then prepped and draped in the normal sterile fashion. 1% Lidocaine was used for local anesthesia. Under ultrasound guidance a 6 Fr Safe-T-Centesis catheter was introduced. Thoracentesis was performed. The catheter was removed and a dressing applied. FINDINGS: A total of approximately 1 L of clear yellow fluid was removed. Samples were sent to the laboratory as requested by the clinical team. IMPRESSION: Successful ultrasound guided right thoracentesis yielding 1 L of pleural fluid. Electronically Signed   By: Inez Catalina M.D.   On: 10/30/2018 13:37    EKG:   Orders placed or performed during the hospital  encounter of 10/28/18  . EKG 12-Lead  . EKG 12-Lead    ASSESSMENT AND PLAN:    *Acute bronchitis with possible underlying COPD.  IV Solu-Medrol is tapered and bronchodilator treatment and changed to p.o. prednisone tapering dose tomorrow if patient is clinically stable Pulmonology is following, okay to discharge patient from pulmonology standpoint  *Right moderate pleural effusion, malignant.  Thoracentesis done 1 month back and now reaccumulated.  Pulmonology is following, status post thoracentesis on 10/30/2018 Pleural biopsy today-CT-guided by interventional radiology  *Stage IV adenocarcinoma of the right lung.   Patient scheduled for chemotherapy on 11/02/2018 as outpatient. Outpatient follow-up with Dr. Mike Gip as scheduled after discharge Palliative care consulted and Josh PA is following the patient  *Diabetes mellitus.  Sliding scale insulin ordered.  Patient on IV steroids and blood sugars will likely be uncontrolled.  *Hypertension.  Continue home medications  *Adult failure to thrive Palliative care has seen the patient; confirms DNR status and wants to pursue biopsy of the pulmonary nodule as recommended by [facility] Patient would like to continue treatments as long as he could maintain an acceptable quality of life and treatments  * Constipation- Continue senna from home.  Added MiraLAX scheduled twice a day.\  *Generalized weakness PT evaluation after biopsy  *DVT prophylaxis.  SCDs ordered.  No Lovenox secondary to intermittent hemoptysis and also need for thoracentesis or Pleurx catheter    All the records are reviewed and case discussed with Care Management/Social Workerr. Management plans discussed with the patient, family and they are in agreement.  CODE STATUS: DNR  TOTAL TIME TAKING CARE OF THIS PATIENT:35 minutes.   POSSIBLE D/C IN 1 DAYS, DEPENDING ON CLINICAL CONDITION.  Note: This dictation was prepared with Dragon dictation along with smaller  phrase technology. Any transcriptional  errors that result from this process are unintentional.   Nicholes Mango M.D on 10/31/2018 at 1:37 PM  Between 7am to 6pm - Pager - [phone] After 6pm go to [email or web address] - password [email or web address] [facility]  [place] [facility]  Office  [phone]  CC: Primary care physician; [name], MD

## 2018-10-31 NOTE — Procedures (Signed)
CT biopsy of RLL lung mass for genetic testing without difficulty  Complications:  None  Blood Loss: none  See dictation in canopy pacs

## 2018-10-31 NOTE — Progress Notes (Signed)
SATURATION QUALIFICATIONS: (This note is used to comply with regulatory documentation for home oxygen)  Patient Saturations on Room Air at Rest = 91%  Patient Saturations on Room Air while Ambulating = 88%  Patient Saturations on 2 Liters of oxygen while Ambulating = 94%  Please briefly explain why patient needs home oxygen:

## 2018-10-31 NOTE — Progress Notes (Signed)
PT Cancellation Note  Patient Details Name: Andres Hebert MRN: [id number] DOB: 12-19-1931   Cancelled Treatment:    Reason Eval/Treat Not Completed: Active bedrest order. Per chart review pt with active bedrest order. PT will follow up as able and appropriate.   Zachary George PT, DPT 2:39 PM,10/31/18 [phone]

## 2018-11-01 NOTE — Progress Notes (Signed)
10/7: Per [place] DME agency representative home oxygen was delivered to patient's house yesterday evening 10/6. 1A nursing assistant director Tiffany contacted patient's wife [name] today 10/7 and also confirmed that oxygen had been delivered.   [employer], LCSW [phone]

## 2018-11-02 ENCOUNTER — Ambulatory Visit: Payer: Medicare HMO | Admitting: Hematology and Oncology

## 2018-11-02 ENCOUNTER — Other Ambulatory Visit: Payer: Self-pay | Admitting: Hematology and Oncology

## 2018-11-02 ENCOUNTER — Other Ambulatory Visit: Payer: Medicare HMO

## 2018-11-02 ENCOUNTER — Telehealth: Payer: Self-pay

## 2018-11-02 ENCOUNTER — Ambulatory Visit: Payer: Medicare HMO

## 2018-11-02 DIAGNOSIS — C3431 Malignant neoplasm of lower lobe, right bronchus or lung: Secondary | ICD-10-CM

## 2018-11-02 LAB — SURGICAL PATHOLOGY

## 2018-11-02 MED ORDER — DEXAMETHASONE 4 MG PO TABS: ORAL_TABLET | ORAL | 1 refills | Status: DC

## 2018-11-02 MED ORDER — FOLIC ACID 1 MG PO TABS: 1.0000 mg | ORAL_TABLET | Freq: Every day | ORAL | 3 refills | Status: DC

## 2018-11-02 MED ORDER — ONDANSETRON HCL 8 MG PO TABS: 8.0000 mg | ORAL_TABLET | Freq: Two times a day (BID) | ORAL | 1 refills | Status: DC | PRN

## 2018-11-02 NOTE — Progress Notes (Signed)
[facility]  [address], [address] [place], [place] [address] Phone: [phone]  Fax: [phone]   Clinic Day:  11/03/2018  Referring physician: [name], MD  Chief Complaint: Andres Hebert is a 85 y.o. male with stage IV adenocarcinoma of therightlung who is seen for assessment prior to cycle #1 of carboplatin, Alimta and pembrolizumab.  HPI: The patient was last seen in the medical oncology clinic on 10/24/2018. At that time, he had mild RUQ pain and mild/moderate right lower rib pain.  He had shortness of breath on exertion and hemoptysis. CXR on 10/24/2018 revealed an interval increase in a right-sided pleural effusion, now moderate, with associated atelectasis or consolidation and multiple pleural based nodules.  He declined thoracentesis.  He received B12 in anticipation of chemotherapy.  He was seen for chemo care by Beckey Rutter, NP on 10/27/2018. Chest and abdomen films revealed increased moderate right malignant pleural effusion. There was a non-obstructive bowel gas pattern.  He was admitted to [facility] from 10/28/2018 - 10/31/2018 with shortness of breath. He underwent ultrasound guided thoracentesis on 10/30/2018.  Approximately 1 L of clear yellow fluid was removed. CT biopsy of the right lower lobe lung mass on 10/31/2018 was performed for mutational studies. The tumor is positive for TTF1 and negative for p40, consistent with lung origin.   Labs followed: 10/28/2018: Hematocrit 41.0, hemoglobin 13.5, MCV 93.6, platelets 149,000, WBC 7900.  Ceatinine 0.98. 10/29/2018: Hematocrit 36.7, hemoglobin 12.2, MCV 92.4, platelets 164,000, WBC 7400  During the interim, he still has hemoptysis. His wife says that he is not sleeping well at night.  He has only been eating one meal, but does enjoy Boost; he will drink 2-3 a day. He was told to avoid white breads by Dr. Kary Kos.    After he left the hospital, he didn't have as much pain on his right side  s/p thoracentesis. He took his Decadron yesterday.   He hasn't taken any steroids today. He did not take any of his nausea medicine. The patient/s wife says the patient has never had Ativan. He has been on oxygen since he left the hospital; he sleep with it on at night.   He doesn't remember much of his appointment with Beckey Rutter, but the wife says that the chemo care class was fine.    Past Medical History:  Diagnosis Date  . BPV (benign positional vertigo)   . Chronic constipation   . Diabetes mellitus without complication ([place])   . Hyperlipidemia   . Hypertension     History reviewed. No pertinent surgical history.  Family History  Problem Relation Age of Onset  . Stroke Mother   . Prostate cancer Father     Social History:  reports that he quit smoking about 44 years ago. His smoking use included cigarettes. He has a 25.00 pack-year smoking history. He has never used smokeless tobacco. He reports previous alcohol use. He reports that he does not use drugs. Hehas a 25-30 pack-year smoking history.He stoppedsmoking about 45 years ago.He denies any exposure to asbestos, radiation or toxins.He worked for [employer] and [employer] lives in [place].His wife's name isSheila. The patient is accompanied by [name] via video conference today.  Allergies:  Allergies  Allergen Reactions  . Accupril [Quinapril Hcl] Other (See Comments)    Reaction:dizziness per MR    Current Medications: Current Outpatient Medications  Medication Sig Dispense Refill  . amLODipine (NORVASC) 5 MG tablet Take 5 mg by mouth daily.    Marland Kitchen  aspirin 81 MG chewable tablet Chew 81 mg by mouth daily.    . citalopram (CELEXA) 10 MG tablet Take 1 tablet by mouth daily.    Marland Kitchen dexamethasone (DECADRON) 4 MG tablet Take 1 tab two times a day the day before Alimta chemo, then take 2 tabs once a day for 3 days starting the day after chemo. 30 tablet 1  . feeding supplement, ENSURE ENLIVE, (ENSURE ENLIVE) LIQD Take 237 mLs by  mouth 3 (three) times daily between meals. 361 mL 0  . folic acid (FOLVITE) 1 MG tablet Take 1 tablet (1 mg total) by mouth daily. 30 tablet 1  . folic acid (FOLVITE) 1 MG tablet Take 1 tablet (1 mg total) by mouth daily. Start 5-7 days before Alimta chemotherapy. Continue until 21 days after Alimta completed. 100 tablet 3  . metoprolol tartrate (LOPRESSOR) 25 MG tablet Take 0.5 tablets (12.5 mg total) by mouth 2 (two) times daily. 60 tablet 0  . oxyCODONE-acetaminophen (PERCOCET/ROXICET) 5-325 MG tablet Take 1 tablet by mouth every 4 (four) hours as needed for moderate pain. 30 tablet 0  . polyethylene glycol (MIRALAX / GLYCOLAX) 17 g packet Take 17 g by mouth 2 (two) times daily. 14 each 0  . potassium chloride (K-DUR,KLOR-CON) 10 MEQ tablet Take 10 mEq by mouth 2 (two) times daily.    . pravastatin (PRAVACHOL) 40 MG tablet Take 40 mg by mouth daily.    Marland Kitchen senna (SENOKOT) 8.6 MG TABS tablet Take 1 tablet (8.6 mg total) by mouth daily as needed for mild constipation. 30 tablet 0  . triamterene-hydrochlorothiazide (MAXZIDE-25) 37.5-25 MG tablet Take 1 tablet by mouth daily.    Marland Kitchen acetaminophen (TYLENOL) 325 MG tablet Take 2 tablets (650 mg total) by mouth every 6 (six) hours as needed for mild pain (or Fever >/= 101). (Patient not taking: Reported on 11/03/2018)    . ondansetron (ZOFRAN) 8 MG tablet Take 1 tablet (8 mg total) by mouth 2 (two) times daily as needed (Nausea or vomiting). Start if needed on the third day after chemotherapy. (Patient not taking: Reported on 11/03/2018) 30 tablet 1  . predniSONE (STERAPRED UNI-PAK 21 TAB) 10 MG (21) TBPK tablet Take 1 tablet (10 mg total) by mouth daily. Take 6 tablets by mouth for 1 day followed by  5 tablets by mouth for 1 day followed by  4 tablets by mouth for 1 day followed by  3 tablets by mouth for 1 day followed by  2 tablets by mouth for 1 day followed by  1 tablet by mouth for a day and stop (Patient not taking: Reported on 11/03/2018) 21 tablet 0    No current facility-administered medications for this visit.     Review of Systems  Constitutional: Positive for malaise/fatigue and weight loss (1 lb). Negative for chills, diaphoresis and fever.       Feels "better".  HENT: Negative.  Negative for congestion, ear pain, hearing loss, nosebleeds, sinus pain and sore throat.   Eyes: Negative.  Negative for blurred vision, double vision and photophobia.  Respiratory: Positive for cough, hemoptysis (x 2-3 months), sputum production (phlegm x 3-4 months) and shortness of breath (on exertion). Negative for wheezing.   Cardiovascular: Negative.  Negative for chest pain, palpitations, leg swelling and PND.  Gastrointestinal: Positive for abdominal pain (right sided discomfort, improved). Negative for blood in stool, constipation, diarrhea, melena, nausea and vomiting.       Poor appetite.  Genitourinary: Negative.  Negative for dysuria, frequency, hematuria  and urgency.  Musculoskeletal: Negative for back pain, joint pain, myalgias and neck pain.       Right lower rib pain.  Skin: Negative.  Negative for itching and rash.  Neurological: Positive for weakness (generalized). Negative for dizziness, tingling, sensory change, speech change, focal weakness, seizures and headaches.  Endo/Heme/Allergies: Negative.  Does not bruise/bleed easily.  Psychiatric/Behavioral: Positive for memory loss. Negative for depression. The patient has insomnia (poor sleep). The patient is not nervous/anxious.   All other systems reviewed and are negative.  Performance status (ECOG): 1-2  Vitals Blood pressure 113/60, pulse 61, temperature 97.9 F (36.6 C), temperature source Tympanic, resp. rate 18, height 5' 6"  (1.676 m), weight 148 lb 7.7 oz (67.4 kg), SpO2 99 %.   Physical Exam  Constitutional: He is oriented to person, place, and time. No distress.  Elderly gentleman requires assistance onto table.  HENT:  Head: Normocephalic and atraumatic.  Mouth/Throat:  Oropharynx is clear and moist. No oropharyngeal exudate.  Gray hair and beard. Dentures.  Mask.  Eyes: Pupils are equal, round, and reactive to light. Conjunctivae and EOM are normal. No scleral icterus.  Glasses.  Neck: Normal range of motion. Neck supple. No JVD present.  Cardiovascular: Normal rate, regular rhythm and normal heart sounds.  No murmur heard. Pulmonary/Chest: Effort normal and breath sounds normal. No respiratory distress. He has no wheezes. He has no rales. He exhibits no tenderness.  Decreased breath sounds right base, improved.  Abdominal: Soft. Bowel sounds are normal. He exhibits no distension and no mass. There is abdominal tenderness (slight) in the right upper quadrant. There is no rebound and no guarding.  Musculoskeletal: Normal range of motion.        General: No tenderness or edema.     Comments: Pain in right lower ribs.  Lymphadenopathy:    He has no cervical adenopathy.    He has no axillary adenopathy.       Right: No supraclavicular adenopathy present.       Left: No supraclavicular adenopathy present.  Neurological: He is alert and oriented to person, place, and time.  Skin: Skin is warm, dry and intact. No bruising noted. He is not diaphoretic. No pallor.  Slight chest wall bruise s/p throracentesis.  Psychiatric: He has a normal mood and affect. His behavior is normal. Judgment and thought content normal.  Nursing note and vitals reviewed.   Appointment on 11/03/2018  Component Date Value Ref Range Status  . TSH 11/03/2018 1.305  0.350 - 4.500 uIU/mL Final   Comment: Performed by a 3rd Generation assay with a functional sensitivity of <=0.01 uIU/mL. Performed at [facility], [address] [place], [place] [address]   . Free T4 11/03/2018 1.37* 0.61 - 1.12 ng/dL Final   Comment: (NOTE) Biotin ingestion may interfere with free T4 tests. If the results are inconsistent with the TSH level, previous test results, or the clinical  presentation, then consider biotin interference. If needed, order repeat testing after stopping biotin. Performed at [facility], [address], [place], [place] [address]   . Sodium 11/03/2018 133* 135 - 145 mmol/L Final  . Potassium 11/03/2018 3.7  3.5 - 5.1 mmol/L Final  . Chloride 11/03/2018 96* 98 - 111 mmol/L Final  . CO2 11/03/2018 26  22 - 32 mmol/L Final  . Glucose, Bld 11/03/2018 121* 70 - 99 mg/dL Final  . BUN 11/03/2018 36* 8 - 23 mg/dL Final  . Creatinine, Ser 11/03/2018 0.84  0.61 - 1.24 mg/dL  Final  . Calcium 11/03/2018 8.9  8.9 - 10.3 mg/dL Final  . Total Protein 11/03/2018 6.5  6.5 - 8.1 g/dL Final  . Albumin 11/03/2018 3.3* 3.5 - 5.0 g/dL Final  . AST 11/03/2018 20  15 - 41 U/L Final  . ALT 11/03/2018 41  0 - 44 U/L Final  . Alkaline Phosphatase 11/03/2018 50  38 - 126 U/L Final  . Total Bilirubin 11/03/2018 0.8  0.3 - 1.2 mg/dL Final  . GFR calc non Af Amer 11/03/2018 >60  >60 mL/min Final  . GFR calc Af Amer 11/03/2018 >60  >60 mL/min Final  . Anion gap 11/03/2018 11  5 - 15 Final   Performed at [facility], [address], [place], [place] [address]  . WBC 11/03/2018 8.2  4.0 - 10.5 K/uL Final  . RBC 11/03/2018 4.68  4.22 - 5.81 MIL/uL Final  . Hemoglobin 11/03/2018 14.3  13.0 - 17.0 g/dL Final  . HCT 11/03/2018 42.9  39.0 - 52.0 % Final  . MCV 11/03/2018 91.7  80.0 - 100.0 fL Final  . MCH 11/03/2018 30.6  26.0 - 34.0 pg Final  . MCHC 11/03/2018 33.3  30.0 - 36.0 g/dL Final  . RDW 11/03/2018 12.7  11.5 - 15.5 % Final  . Platelets 11/03/2018 182  150 - 400 K/uL Final  . nRBC 11/03/2018 0.0  0.0 - 0.2 % Final  . Neutrophils Relative % 11/03/2018 79  % Final  . Neutro Abs 11/03/2018 6.5  1.7 - 7.7 K/uL Final  . Lymphocytes Relative 11/03/2018 13  % Final  . Lymphs Abs 11/03/2018 1.0  0.7 - 4.0 K/uL Final  . Monocytes Relative 11/03/2018 7  % Final  . Monocytes Absolute 11/03/2018 0.6  0.1 - 1.0 K/uL Final  . Eosinophils Relative  11/03/2018 0  % Final  . Eosinophils Absolute 11/03/2018 0.0  0.0 - 0.5 K/uL Final  . Basophils Relative 11/03/2018 0  % Final  . Basophils Absolute 11/03/2018 0.0  0.0 - 0.1 K/uL Final  . Immature Granulocytes 11/03/2018 1  % Final  . Abs Immature Granulocytes 11/03/2018 0.05  0.00 - 0.07 K/uL Final   Performed at [facility], [address], [place], [place] [address]    Assessment:  Andres Hebert is a 85 y.o. male with metastaticadenocarcinoma of thelungs/p right sided thoracentesis on 10/06/2018.Cytologyconfirmed non-small cell carcinoma, favor adenocarcinoma of the lung.He has a 25-30 pack year smoking history.He has clinical stageT4NxM1b.  Chest CT angiogramon 10/05/2018 revealed a 8.4 x 6.9 cm right lower lobe mass with numerous pleural nodules and a large right pleural effusion. There was bony destruction of the right lateral 7th and 8th ribs. There was no pulmonary embolism.  Head MRIon 10/07/2018 revealed no metastatic disease or acute intracranial abnormality.There was distal left vertebral artery with poor flow or occlusion, most likely due to atherosclerosis.  PET scan on 10/18/2018 revealed a large hypermetabolic RIGHT lower lobe mass consistent with bronchogenic carcinoma. There was multifocal pleural metastasis within the RIGHT hemithorax. There was one pleural metastatic lesion invading adjacent RIGHT seventh rib, and moderate RIGHT effusion. There was probable RIGHT hilar metastatic adenopathy, and no mediastinal or supraclavicular metastatic adenopathy.    He is s/p thoracentesis x 2 (10/06/2018 and 10/30/2018).  CT biopsy of the right lower lobe lung mass on 10/31/2018 was performed for mutational studies.  He received the influenza vaccine on 10/13/2018.  Symptomatically, he has less discomfort and is breathing better s/p thoracentesis.  Exam reveals decreased breath sounds right base (improved).  Plan: 1.   Labs today: CBC with  diff, CMP, Mg, LDH, TSH, free T4   2.   Stage IV adenocarcinoma of the lung             Review interval hospitalization, thoracentesis, and lung biopsy.   Follow-up [facility] studies on lung biopsy.   Discuss plan for repeat thoracentesis if needed.   Anticipate decrease in recurrence with initiation of treatment.             Review patient has stage IV disease.  Treatment is palliative.             Discuss plan to proceed with one cycle of chemotherapy until mutational studies are back.                         Review potential switch in therapy after first cycle of chemotherapy if actionable mutation.   Patient in agreement.             Discuss carboplatin, Alimta, and pembrolizumab.                         Potential side effects re-reviewed.   Given his performance status, discuss 1st cycle without carboplatin.    Patient in agreement and consented to treatment today.                         He received B12, has been taking folic acid, and took Decadron yesterday.   Review Decadron administration post chemotherapy.   Given higher dose of Decadron then oral steroid taper from hospital, discontinue oral prednisone prescribed during hospitalization. Future treatment options based on mutational analaysis (EGFR, ALK, ROS1, BRAF, MET, RET, PDL-1). If targetable mutation- directed therapy. If no targetable mutation and PDL-1 >50% consider pembrolizumab alone. If no targetable mutation and PDL-1 <50% or unknown consider carboplatin + Alimta + pembrolizumab. Rediscuss port-a-cath placement.                         If additional IV treatments needed, pursue port placement.  Encourage patient to contact clinic if any concerns s/p treatment today. 3.   Weight loss Patient's appetite is modest. Continue to encourage high calorie foods and  Ensure.             Consider Megace in future if does not improve. 4. Bone metastasis Patient has a rib metastasis seen on chest CT. No other sites of metastasis on bone scan. Consider Xgeva to prevent bone related events. 5. Cancer-related pain Pain poorly controlled during the day as he is only taking pain medications at night.             Continue Percocet 5-325 1 tablet every 4 hours prn pain (last Rx 10/24/2018). 6.   Begin cycle #1 Alimta and pembrolizumab today. 7.   RTC in 1 week for MD assessment and labs (CBC with diff, BMP).  I discussed the assessment and treatment plan with the patient.  The patient was provided an opportunity to ask questions and all were answered.  The patient agreed with the plan and demonstrated an understanding of the instructions.  The patient was advised to call back if the symptoms worsen or if the condition fails to improve as anticipated.  I provided 25 minutes (09:15 AM - 09:40 AM) of face-to-face time  during this this encounter and > 50% was spent counseling as documented under my assessment and plan.    Lequita Asal, MD, PhD    11/03/2018, 1:32 PM  I, Samul Dada, am acting as a scribe for Lequita Asal, MD.  I, [place] Mike Gip, MD, have reviewed the above documentation for accuracy and completeness, and I agree with the above.

## 2018-11-02 NOTE — Telephone Encounter (Signed)
Spoke with patient's wife and informed her patient will need to take Decadron 4 MG Tablet x 1 today since he has had 50 MG of Prednisone today. Wife also made aware that he should not take any Steroids or Antiemetics tomorrow because he will receive this with treatment. Informed wife that the patient will need to bring all of his medications for visit tomorrow. Wife verbalizes understanding and agrees to give medication tonight. Denies any further questions or concerns.

## 2018-11-03 ENCOUNTER — Other Ambulatory Visit: Payer: Self-pay

## 2018-11-03 ENCOUNTER — Encounter: Payer: Self-pay | Admitting: Hematology and Oncology

## 2018-11-03 ENCOUNTER — Inpatient Hospital Stay: Payer: Medicare HMO

## 2018-11-03 ENCOUNTER — Ambulatory Visit: Admission: RE | Admit: 2018-11-03 | Payer: Medicare HMO | Source: Ambulatory Visit

## 2018-11-03 ENCOUNTER — Inpatient Hospital Stay (HOSPITAL_BASED_OUTPATIENT_CLINIC_OR_DEPARTMENT_OTHER): Payer: Medicare HMO | Admitting: Hematology and Oncology

## 2018-11-03 VITALS — BP 123/73 | HR 51 | Resp 18

## 2018-11-03 VITALS — BP 113/60 | HR 61 | Temp 97.9°F | Resp 18 | Ht 66.0 in | Wt 148.5 lb

## 2018-11-03 DIAGNOSIS — R634 Abnormal weight loss: Secondary | ICD-10-CM | POA: Diagnosis not present

## 2018-11-03 DIAGNOSIS — Z5111 Encounter for antineoplastic chemotherapy: Secondary | ICD-10-CM | POA: Insufficient documentation

## 2018-11-03 DIAGNOSIS — E119 Type 2 diabetes mellitus without complications: Secondary | ICD-10-CM | POA: Diagnosis not present

## 2018-11-03 DIAGNOSIS — C3431 Malignant neoplasm of lower lobe, right bronchus or lung: Secondary | ICD-10-CM

## 2018-11-03 DIAGNOSIS — Z5112 Encounter for antineoplastic immunotherapy: Secondary | ICD-10-CM | POA: Insufficient documentation

## 2018-11-03 DIAGNOSIS — E860 Dehydration: Secondary | ICD-10-CM | POA: Insufficient documentation

## 2018-11-03 DIAGNOSIS — Z7189 Other specified counseling: Secondary | ICD-10-CM

## 2018-11-03 DIAGNOSIS — C7951 Secondary malignant neoplasm of bone: Secondary | ICD-10-CM | POA: Diagnosis not present

## 2018-11-03 DIAGNOSIS — Z823 Family history of stroke: Secondary | ICD-10-CM | POA: Insufficient documentation

## 2018-11-03 DIAGNOSIS — R451 Restlessness and agitation: Secondary | ICD-10-CM | POA: Insufficient documentation

## 2018-11-03 DIAGNOSIS — R109 Unspecified abdominal pain: Secondary | ICD-10-CM | POA: Diagnosis not present

## 2018-11-03 DIAGNOSIS — R001 Bradycardia, unspecified: Secondary | ICD-10-CM | POA: Diagnosis not present

## 2018-11-03 DIAGNOSIS — J910 Malignant pleural effusion: Secondary | ICD-10-CM | POA: Insufficient documentation

## 2018-11-03 DIAGNOSIS — R0602 Shortness of breath: Secondary | ICD-10-CM | POA: Insufficient documentation

## 2018-11-03 DIAGNOSIS — C782 Secondary malignant neoplasm of pleura: Secondary | ICD-10-CM | POA: Diagnosis not present

## 2018-11-03 DIAGNOSIS — R0781 Pleurodynia: Secondary | ICD-10-CM | POA: Insufficient documentation

## 2018-11-03 DIAGNOSIS — G893 Neoplasm related pain (acute) (chronic): Secondary | ICD-10-CM

## 2018-11-03 DIAGNOSIS — J90 Pleural effusion, not elsewhere classified: Secondary | ICD-10-CM | POA: Diagnosis not present

## 2018-11-03 DIAGNOSIS — D72819 Decreased white blood cell count, unspecified: Secondary | ICD-10-CM | POA: Insufficient documentation

## 2018-11-03 DIAGNOSIS — R042 Hemoptysis: Secondary | ICD-10-CM | POA: Diagnosis not present

## 2018-11-03 DIAGNOSIS — Z8249 Family history of ischemic heart disease and other diseases of the circulatory system: Secondary | ICD-10-CM | POA: Insufficient documentation

## 2018-11-03 DIAGNOSIS — Z87891 Personal history of nicotine dependence: Secondary | ICD-10-CM | POA: Insufficient documentation

## 2018-11-03 DIAGNOSIS — D696 Thrombocytopenia, unspecified: Secondary | ICD-10-CM | POA: Diagnosis not present

## 2018-11-03 DIAGNOSIS — R600 Localized edema: Secondary | ICD-10-CM | POA: Insufficient documentation

## 2018-11-03 DIAGNOSIS — R339 Retention of urine, unspecified: Secondary | ICD-10-CM | POA: Insufficient documentation

## 2018-11-03 DIAGNOSIS — R0989 Other specified symptoms and signs involving the circulatory and respiratory systems: Secondary | ICD-10-CM | POA: Insufficient documentation

## 2018-11-03 DIAGNOSIS — R531 Weakness: Secondary | ICD-10-CM | POA: Insufficient documentation

## 2018-11-03 DIAGNOSIS — I959 Hypotension, unspecified: Secondary | ICD-10-CM | POA: Insufficient documentation

## 2018-11-03 DIAGNOSIS — R5383 Other fatigue: Secondary | ICD-10-CM | POA: Diagnosis not present

## 2018-11-03 DIAGNOSIS — Z79899 Other long term (current) drug therapy: Secondary | ICD-10-CM | POA: Insufficient documentation

## 2018-11-03 LAB — CBC WITH DIFFERENTIAL/PLATELET
Abs Immature Granulocytes: 0.05 10*3/uL (ref 0.00–0.07)
Basophils Absolute: 0 10*3/uL (ref 0.0–0.1)
Basophils Relative: 0 %
Eosinophils Absolute: 0 10*3/uL (ref 0.0–0.5)
Eosinophils Relative: 0 %
HCT: 42.9 % (ref 39.0–52.0)
Hemoglobin: 14.3 g/dL (ref 13.0–17.0)
Immature Granulocytes: 1 %
Lymphocytes Relative: 13 %
Lymphs Abs: 1 10*3/uL (ref 0.7–4.0)
MCH: 30.6 pg (ref 26.0–34.0)
MCHC: 33.3 g/dL (ref 30.0–36.0)
MCV: 91.7 fL (ref 80.0–100.0)
Monocytes Absolute: 0.6 10*3/uL (ref 0.1–1.0)
Monocytes Relative: 7 %
Neutro Abs: 6.5 10*3/uL (ref 1.7–7.7)
Neutrophils Relative %: 79 %
Platelets: 182 10*3/uL (ref 150–400)
RBC: 4.68 MIL/uL (ref 4.22–5.81)
RDW: 12.7 % (ref 11.5–15.5)
WBC: 8.2 10*3/uL (ref 4.0–10.5)
nRBC: 0 % (ref 0.0–0.2)

## 2018-11-03 LAB — COMPREHENSIVE METABOLIC PANEL
ALT: 41 U/L (ref 0–44)
AST: 20 U/L (ref 15–41)
Albumin: 3.3 g/dL — ABNORMAL LOW (ref 3.5–5.0)
Alkaline Phosphatase: 50 U/L (ref 38–126)
Anion gap: 11 (ref 5–15)
BUN: 36 mg/dL — ABNORMAL HIGH (ref 8–23)
CO2: 26 mmol/L (ref 22–32)
Calcium: 8.9 mg/dL (ref 8.9–10.3)
Chloride: 96 mmol/L — ABNORMAL LOW (ref 98–111)
Creatinine, Ser: 0.84 mg/dL (ref 0.61–1.24)
GFR calc Af Amer: >60 mL/min (ref >60)
GFR calc non Af Amer: >60 mL/min (ref >60)
Glucose, Bld: 121 mg/dL — ABNORMAL HIGH (ref 70–99)
Potassium: 3.7 mmol/L (ref 3.5–5.1)
Sodium: 133 mmol/L — ABNORMAL LOW (ref 135–145)
Total Bilirubin: 0.8 mg/dL (ref 0.3–1.2)
Total Protein: 6.5 g/dL (ref 6.5–8.1)

## 2018-11-03 LAB — T4, FREE: Free T4: 1.37 ng/dL — ABNORMAL HIGH (ref 0.61–1.12)

## 2018-11-03 LAB — TSH: TSH: 1.305 u[IU]/mL (ref 0.350–4.500)

## 2018-11-03 MED ORDER — SODIUM CHLORIDE 0.9 % IV SOLN
200.0000 mg | Freq: Once | INTRAVENOUS | Status: AC
  Administered 2018-11-03: 11:00:00 200 mg via INTRAVENOUS
  Filled 2018-11-03: qty 8

## 2018-11-03 MED ORDER — SODIUM CHLORIDE 0.9 % IV SOLN
Freq: Once | INTRAVENOUS | Status: AC
  Administered 2018-11-03: 10:00:00 via INTRAVENOUS
  Filled 2018-11-03: qty 250

## 2018-11-03 MED ORDER — PALONOSETRON HCL INJECTION 0.25 MG/5ML
0.2500 mg | Freq: Once | INTRAVENOUS | Status: AC
  Administered 2018-11-03: 10:00:00 0.25 mg via INTRAVENOUS
  Filled 2018-11-03: qty 5

## 2018-11-03 MED ORDER — SODIUM CHLORIDE 0.9 % IV SOLN
Freq: Once | INTRAVENOUS | Status: AC
  Administered 2018-11-03: 10:00:00 via INTRAVENOUS
  Filled 2018-11-03: qty 5

## 2018-11-03 MED ORDER — SODIUM CHLORIDE 0.9 % IV SOLN
500.0000 mg/m2 | Freq: Once | INTRAVENOUS | Status: AC
  Administered 2018-11-03: 900 mg via INTRAVENOUS
  Filled 2018-11-03: qty 36

## 2018-11-03 NOTE — Patient Instructions (Signed)
Pembrolizumab injection What is this medicine? PEMBROLIZUMAB (pem broe liz ue mab) is a monoclonal antibody. It is used to treat bladder cancer, cervical cancer, endometrial cancer, esophageal cancer, head and neck cancer, hepatocellular cancer, Hodgkin lymphoma, kidney cancer, lymphoma, melanoma, Merkel cell carcinoma, lung cancer, stomach cancer, urothelial cancer, and cancers that have a certain genetic condition. This medicine may be used for other purposes; ask your health care provider or pharmacist if you have questions. COMMON BRAND NAME(S): Keytruda What should I tell my health care provider before I take this medicine? They need to know if you have any of these conditions:  diabetes  immune system problems  inflammatory bowel disease  liver disease  lung or breathing disease  lupus  received or scheduled to receive an organ transplant or a stem-cell transplant that uses donor stem cells  an unusual or allergic reaction to pembrolizumab, other medicines, foods, dyes, or preservatives  pregnant or trying to get pregnant  breast-feeding How should I use this medicine? This medicine is for infusion into a vein. It is given by a health care professional in a hospital or clinic setting. A special MedGuide will be given to you before each treatment. Be sure to read this information carefully each time. Talk to your pediatrician regarding the use of this medicine in children. While this drug may be prescribed for selected conditions, precautions do apply. Overdosage: If you think you have taken too much of this medicine contact a poison control center or emergency room at once. NOTE: This medicine is only for you. Do not share this medicine with others. What if I miss a dose? It is important not to miss your dose. Call your doctor or health care professional if you are unable to keep an appointment. What may interact with this medicine? Interactions have not been studied. Give  your health care provider a list of all the medicines, herbs, non-prescription drugs, or dietary supplements you use. Also tell them if you smoke, drink alcohol, or use illegal drugs. Some items may interact with your medicine. This list may not describe all possible interactions. Give your health care provider a list of all the medicines, herbs, non-prescription drugs, or dietary supplements you use. Also tell them if you smoke, drink alcohol, or use illegal drugs. Some items may interact with your medicine. What should I watch for while using this medicine? Your condition will be monitored carefully while you are receiving this medicine. You may need blood work done while you are taking this medicine. Do not become pregnant while taking this medicine or for 4 months after stopping it. Women should inform their doctor if they wish to become pregnant or think they might be pregnant. There is a potential for serious side effects to an unborn child. Talk to your health care professional or pharmacist for more information. Do not breast-feed an infant while taking this medicine or for 4 months after the last dose. What side effects may I notice from receiving this medicine? Side effects that you should report to your doctor or health care professional as soon as possible:  allergic reactions like skin rash, itching or hives, swelling of the face, lips, or tongue  bloody or black, tarry  breathing problems  changes in vision  chest pain  chills  confusion  constipation  cough  diarrhea  dizziness or feeling faint or lightheaded  fast or irregular heartbeat  fever  flushing  hair loss  joint pain  low blood counts - this  medicine may decrease the number of white blood cells, red blood cells and platelets. You may be at increased risk for infections and bleeding.  muscle pain  muscle weakness  persistent headache  redness, blistering, peeling or loosening of the skin,  including inside the mouth  signs and symptoms of high blood sugar such as dizziness; dry mouth; dry skin; fruity breath; nausea; stomach pain; increased hunger or thirst; increased urination  signs and symptoms of kidney injury like trouble passing urine or change in the amount of urine  signs and symptoms of liver injury like dark urine, light-colored stools, loss of appetite, nausea, right upper belly pain, yellowing of the eyes or skin  sweating  swollen lymph nodes  weight loss Side effects that usually do not require medical attention (report to your doctor or health care professional if they continue or are bothersome):  decreased appetite  muscle pain  tiredness This list may not describe all possible side effects. Call your doctor for medical advice about side effects. You may report side effects to FDA at 1-800-FDA-1088. Where should I keep my medicine? This drug is given in a hospital or clinic and will not be stored at home. NOTE: This sheet is a summary. It may not cover all possible information. If you have questions about this medicine, talk to your doctor, pharmacist, or health care provider.  2020 [employer] Standard (2018-02-07 13:46:58) Pemetrexed injection What is this medicine? PEMETREXED (PEM e TREX ed) is a chemotherapy drug used to treat lung cancers like non-small cell lung cancer and mesothelioma. It may also be used to treat other cancers. This medicine may be used for other purposes; ask your health care provider or pharmacist if you have questions. COMMON BRAND NAME(S): Alimta What should I tell my health care provider before I take this medicine? They need to know if you have any of these conditions:  infection (especially a virus infection such as chickenpox, cold sores, or herpes)  kidney disease  low blood counts, like low white cell, platelet, or red cell counts  lung or breathing disease, like asthma  radiation therapy  an unusual or  allergic reaction to pemetrexed, other medicines, foods, dyes, or preservative  pregnant or trying to get pregnant  breast-feeding How should I use this medicine? This drug is given as an infusion into a vein. It is administered in a hospital or clinic by a specially trained health care professional. Talk to your pediatrician regarding the use of this medicine in children. Special care may be needed. Overdosage: If you think you have taken too much of this medicine contact a poison control center or emergency room at once. NOTE: This medicine is only for you. Do not share this medicine with others. What if I miss a dose? It is important not to miss your dose. Call your doctor or health care professional if you are unable to keep an appointment. What may interact with this medicine? This medicine may interact with the following medications:  Ibuprofen This list may not describe all possible interactions. Give your health care provider a list of all the medicines, herbs, non-prescription drugs, or dietary supplements you use. Also tell them if you smoke, drink alcohol, or use illegal drugs. Some items may interact with your medicine. What should I watch for while using this medicine? Visit your doctor for checks on your progress. This drug may make you feel generally unwell. This is not uncommon, as chemotherapy can affect healthy cells as well as  cancer cells. Report any side effects. Continue your course of treatment even though you feel ill unless your doctor tells you to stop. In some cases, you may be given additional medicines to help with side effects. Follow all directions for their use. Call your doctor or health care professional for advice if you get a fever, chills or sore throat, or other symptoms of a cold or flu. Do not treat yourself. This drug decreases your body's ability to fight infections. Try to avoid being around people who are sick. This medicine may increase your risk to  bruise or bleed. Call your doctor or health care professional if you notice any unusual bleeding. Be careful brushing and flossing your teeth or using a toothpick because you may get an infection or bleed more easily. If you have any dental work done, tell your dentist you are receiving this medicine. Avoid taking products that contain aspirin, acetaminophen, ibuprofen, naproxen, or ketoprofen unless instructed by your doctor. These medicines may hide a fever. Call your doctor or health care professional if you get diarrhea or mouth sores. Do not treat yourself. To protect your kidneys, drink water or other fluids as directed while you are taking this medicine. Do not become pregnant while taking this medicine or for 6 months after stopping it. Women should inform their doctor if they wish to become pregnant or think they might be pregnant. Men should not father a child while taking this medicine and for 3 months after stopping it. This may interfere with the ability to father a child. You should talk to your doctor or health care professional if you are concerned about your fertility. There is a potential for serious side effects to an unborn child. Talk to your health care professional or pharmacist for more information. Do not breast-feed an infant while taking this medicine or for 1 week after stopping it. What side effects may I notice from receiving this medicine? Side effects that you should report to your doctor or health care professional as soon as possible:  allergic reactions like skin rash, itching or hives, swelling of the face, lips, or tongue  breathing problems  redness, blistering, peeling or loosening of the skin, including inside the mouth  signs and symptoms of bleeding such as bloody or black, tarry stools; red or dark-brown urine; spitting up blood or brown material that looks like coffee grounds; red spots on the skin; unusual bruising or bleeding from the eye, gums, or  nose  signs and symptoms of infection like fever or chills; cough; sore throat; pain or trouble passing urine  signs and symptoms of kidney injury like trouble passing urine or change in the amount of urine  signs and symptoms of liver injury like dark yellow or brown urine; general ill feeling or flu-like symptoms; light-colored stools; loss of appetite; nausea; right upper belly pain; unusually weak or tired; yellowing of the eyes or skin Side effects that usually do not require medical attention (report to your doctor or health care professional if they continue or are bothersome):  constipation  mouth sores  nausea, vomiting  unusually weak or tired This list may not describe all possible side effects. Call your doctor for medical advice about side effects. You may report side effects to FDA at 1-800-FDA-1088. Where should I keep my medicine? This drug is given in a hospital or clinic and will not be stored at home. NOTE: This sheet is a summary. It may not cover all possible information. If  you have questions about this medicine, talk to your doctor, pharmacist, or health care provider.  2020 [employer] Standard (2017-03-02 16:11:33)

## 2018-11-03 NOTE — Progress Notes (Signed)
The patient wife states the patient is not eating well and all he wants to do is sleep. The patient is currently take medication in chart

## 2018-11-03 NOTE — Progress Notes (Signed)
11/03/18  Clarified with MD - give Aloxi and Emend/Dexamethasone today for full recovery of side effects.  T.O. Dr Marijean Niemann RN/Andreika Vandagriff Ronnald Ramp, PharmD

## 2018-11-06 ENCOUNTER — Telehealth: Payer: Self-pay

## 2018-11-06 NOTE — Telephone Encounter (Signed)
T/C to patients home for follow up after first infusion and wife answered the phone.  She states patient is doing "great" after receiving chemotherapy.  States eating well and denies any nausea or vomiting.   Wife states patient doing better than she expected.  Encouraged wife to call for any questions or concerns.

## 2018-11-06 NOTE — Telephone Encounter (Signed)
Returned call to Andres Hebert [phone]), and provided ICD-10 Code and Insurance information. Andres Hebert states this is all the information that was needed and denied having any further questions.

## 2018-11-07 NOTE — Progress Notes (Signed)
[facility] [facility]  [address], [address] [place], [place] [address] Phone: [phone]  Fax: [phone]   Clinic Day:  11/07/2018  Referring physician: Hedrick, James, MD  Chief Complaint: Andres Hebert is a 85 y.o. male with stage IV adenocarcinoma of therightlung who is seen for nadir assessment on day 12 of cycle #1 Alimta and pembrolizumab.  HPI: The patient was last seen in the medical oncology clinic on 11/03/2018. At that time, he had less discomfort and was breathing better s/p thoracentesis. Exam revealed decreased breath sounds right base (improved).  He received cycle #1 Alimta and pembrolizumab.  He continued Percocet 5-325 1 tablet every 4 hours prn pain.   During the interim, he has been fatigued.  He is not sleeping well.  He describes being restless.  He had 2 falls, one involving a recliner.  He did not lose consciousness or hit his head.  He has no bruises because of the falls.  He is not drinking a lot.  He is trying to drink Ensure.  He denies any nausea, vomiting or diarrhea.  He denies any RUQ or right lower rib pain.  He notes some slight right lower rib discomfort with movement.  He complains of his tailbone hurting as he has been sitting a lot.  He notes some new ankle edema.  He denies any shortness of breath.  He continues to have hemoptysis (unchanged).     Past Medical History:  Diagnosis Date  . BPV (benign positional vertigo)   . Chronic constipation   . Diabetes mellitus without complication (HCC)   . Hyperlipidemia   . Hypertension     No past surgical history on file.  Family History  Problem Relation Age of Onset  . Stroke Mother   . Prostate cancer Father     Social History:  reports that he quit smoking about 44 years ago. His smoking use included cigarettes. He has a 25.00 pack-year smoking history. He has never used smokeless tobacco. He reports previous alcohol use. He reports that he does not use drugs.  Hehas a  25-30 pack-year smoking history.He stoppedsmoking about 45 years ago.He denies any exposure to asbestos, radiation or toxins.He worked for [employer] and [employer].He lives in [place].His wife's name isSheila. The patient is accompanied by his wife, Sheila via phone today.  Allergies:  Allergies  Allergen Reactions  . Accupril [Quinapril Hcl] Other (See Comments)    Reaction:dizziness per MR    Current Medications: Current Outpatient Medications  Medication Sig Dispense Refill  . acetaminophen (TYLENOL) 325 MG tablet Take 2 tablets (650 mg total) by mouth every 6 (six) hours as needed for mild pain (or Fever >/= 101). (Patient not taking: Reported on 11/03/2018)    . amLODipine (NORVASC) 5 MG tablet Take 5 mg by mouth daily.    . aspirin 81 MG chewable tablet Chew 81 mg by mouth daily.    . citalopram (CELEXA) 10 MG tablet Take 1 tablet by mouth daily.    . dexamethasone (DECADRON) 4 MG tablet Take 1 tab two times a day the day before Alimta chemo, then take 2 tabs once a day for 3 days starting the day after chemo. 30 tablet 1  . feeding supplement, ENSURE ENLIVE, (ENSURE ENLIVE) LIQD Take 237 mLs by mouth 3 (three) times daily between meals. 237 mL 0  . folic acid (FOLVITE) 1 MG tablet Take 1 tablet (1 mg total) by mouth daily. 30 tablet 1  . folic acid (FOLVITE)   1 MG tablet Take 1 tablet (1 mg total) by mouth daily. Start 5-7 days before Alimta chemotherapy. Continue until 21 days after Alimta completed. 100 tablet 3  . metoprolol tartrate (LOPRESSOR) 25 MG tablet Take 0.5 tablets (12.5 mg total) by mouth 2 (two) times daily. 60 tablet 0  . ondansetron (ZOFRAN) 8 MG tablet Take 1 tablet (8 mg total) by mouth 2 (two) times daily as needed (Nausea or vomiting). Start if needed on the third day after chemotherapy. (Patient not taking: Reported on 11/03/2018) 30 tablet 1  . oxyCODONE-acetaminophen (PERCOCET/ROXICET) 5-325 MG tablet Take 1 tablet by mouth every 4 (four) hours as needed for moderate  pain. 30 tablet 0  . polyethylene glycol (MIRALAX / GLYCOLAX) 17 g packet Take 17 g by mouth 2 (two) times daily. 14 each 0  . potassium chloride (K-DUR,KLOR-CON) 10 MEQ tablet Take 10 mEq by mouth 2 (two) times daily.    . pravastatin (PRAVACHOL) 40 MG tablet Take 40 mg by mouth daily.    . predniSONE (STERAPRED UNI-PAK 21 TAB) 10 MG (21) TBPK tablet Take 1 tablet (10 mg total) by mouth daily. Take 6 tablets by mouth for 1 day followed by  5 tablets by mouth for 1 day followed by  4 tablets by mouth for 1 day followed by  3 tablets by mouth for 1 day followed by  2 tablets by mouth for 1 day followed by  1 tablet by mouth for a day and stop (Patient not taking: Reported on 11/03/2018) 21 tablet 0  . senna (SENOKOT) 8.6 MG TABS tablet Take 1 tablet (8.6 mg total) by mouth daily as needed for mild constipation. 30 tablet 0  . triamterene-hydrochlorothiazide (MAXZIDE-25) 37.5-25 MG tablet Take 1 tablet by mouth daily.     No current facility-administered medications for this visit.     Review of Systems  Constitutional: Positive for malaise/fatigue and weight loss (3 pounds in 11 days). Negative for chills, diaphoresis and fever.  HENT: Negative.  Negative for congestion, ear pain, hearing loss, nosebleeds, sinus pain and sore throat.   Eyes: Negative.  Negative for blurred vision, double vision and photophobia.  Respiratory: Positive for cough (chronic), hemoptysis (chronic- no change in volume or amount) and sputum production (phlegm). Negative for shortness of breath and wheezing.   Cardiovascular: Positive for leg swelling (feet and ankles). Negative for chest pain, palpitations and PND.  Gastrointestinal: Positive for constipation (chronic on stool softener). Negative for abdominal pain, blood in stool, diarrhea (1 episode x 1 day ago), melena, nausea and vomiting.       Poor appetite.  Genitourinary: Negative for dysuria, flank pain, frequency, hematuria and urgency.  Musculoskeletal:  Positive for falls. Negative for back pain, joint pain, myalgias and neck pain.       Pain in tailbone with sitting; denies skin breakdown.  Right lower rib pain only with movement (improved).  Skin: Negative.  Negative for itching and rash.  Neurological: Positive for weakness (generalized). Negative for dizziness, tingling, sensory change, speech change, focal weakness, seizures and headaches.  Endo/Heme/Allergies: Negative.  Does not bruise/bleed easily.  Psychiatric/Behavioral: Positive for memory loss. Negative for depression. The patient has insomnia. The patient is not nervous/anxious.   All other systems reviewed and are negative.  Performance status (ECOG): 2-3  Vitals Blood pressure (!) 80/50, pulse 79, temperature 98.7 F (37.1 C), temperature source Tympanic, resp. rate 16, height 5' 6" (1.676 m), weight 145 lb 9.6 oz (66 kg), SpO2 97 %.  Physical   Exam  Constitutional: He is oriented to person, place, and time.  Fatigued appearing gentleman sitting comfortably in a wheelchair in no acute distress.  HENT:  Head: Normocephalic and atraumatic.  Mouth/Throat: Oropharynx is clear and moist. No oropharyngeal exudate.  Gray hair and beard.  Dentures.  [place] 2 liters/min.  Mask.  Eyes: Pupils are equal, round, and reactive to light. Conjunctivae and EOM are normal. No scleral icterus.  Glasses.  Blue eyes.  Neck: Normal range of motion. Neck supple. No JVD present.  Cardiovascular: Normal heart sounds. Exam reveals no gallop.  No murmur heard. Hear rate fluctuates with movement and talking.  Pulmonary/Chest: Effort normal and breath sounds normal. No respiratory distress. He has no wheezes. He has no rales.  Decreased breath sounds right 1/4 way up.  Abdominal: Soft. Bowel sounds are normal. He exhibits no distension and no mass. There is no abdominal tenderness. There is no rebound and no guarding.  Musculoskeletal: Normal range of motion.        General: Edema (bilateral ankle)  present. No tenderness.  Lymphadenopathy:    He has no cervical adenopathy.    He has no axillary adenopathy.       Right: No supraclavicular adenopathy present.       Left: No supraclavicular adenopathy present.  Neurological: He is alert and oriented to person, place, and time.  Skin: Skin is warm, dry and intact. No bruising and no rash noted. He is not diaphoretic. No erythema. No pallor.  Psychiatric: He has a normal mood and affect. His behavior is normal. Judgment and thought content normal.  Nursing note and vitals reviewed.   No visits with results within 3 Day(s) from this visit.  Latest known visit with results is:  Appointment on 11/03/2018  Component Date Value Ref Range Status  . TSH 11/03/2018 1.305  0.350 - 4.500 uIU/mL Final   Comment: Performed by a 3rd Generation assay with a functional sensitivity of <=0.01 uIU/mL. Performed at [facility], [address] [place], [place] [address]   . Free T4 11/03/2018 1.37* 0.61 - 1.12 ng/dL Final   Comment: (NOTE) Biotin ingestion may interfere with free T4 tests. If the results are inconsistent with the TSH level, previous test results, or the clinical presentation, then consider biotin interference. If needed, order repeat testing after stopping biotin. Performed at [facility], [address] [place], [place] [address]   . Sodium 11/03/2018 133* 135 - 145 mmol/L Final  . Potassium 11/03/2018 3.7  3.5 - 5.1 mmol/L Final  . Chloride 11/03/2018 96* 98 - 111 mmol/L Final  . CO2 11/03/2018 26  22 - 32 mmol/L Final  . Glucose, Bld 11/03/2018 121* 70 - 99 mg/dL Final  . BUN 11/03/2018 36* 8 - 23 mg/dL Final  . Creatinine, Ser 11/03/2018 0.84  0.61 - 1.24 mg/dL Final  . Calcium 11/03/2018 8.9  8.9 - 10.3 mg/dL Final  . Total Protein 11/03/2018 6.5  6.5 - 8.1 g/dL Final  . Albumin 11/03/2018 3.3* 3.5 - 5.0 g/dL Final  . AST 11/03/2018 20  15 - 41 U/L Final  . ALT 11/03/2018 41  0 - 44 U/L Final  .  Alkaline Phosphatase 11/03/2018 50  38 - 126 U/L Final  . Total Bilirubin 11/03/2018 0.8  0.3 - 1.2 mg/dL Final  . GFR calc non Af Amer 11/03/2018 >60  >60 mL/min Final  . GFR calc Af Amer 11/03/2018 >60  >60 mL/min Final  . Anion gap 11/03/2018 11    5 - 15 Final   Performed at [facility], [address] [place], [place] [address]  . WBC 11/03/2018 8.2  4.0 - 10.5 K/uL Final  . RBC 11/03/2018 4.68  4.22 - 5.81 MIL/uL Final  . Hemoglobin 11/03/2018 14.3  13.0 - 17.0 g/dL Final  . HCT 11/03/2018 42.9  39.0 - 52.0 % Final  . MCV 11/03/2018 91.7  80.0 - 100.0 fL Final  . MCH 11/03/2018 30.6  26.0 - 34.0 pg Final  . MCHC 11/03/2018 33.3  30.0 - 36.0 g/dL Final  . RDW 11/03/2018 12.7  11.5 - 15.5 % Final  . Platelets 11/03/2018 182  150 - 400 K/uL Final  . nRBC 11/03/2018 0.0  0.0 - 0.2 % Final  . Neutrophils Relative % 11/03/2018 79  % Final  . Neutro Abs 11/03/2018 6.5  1.7 - 7.7 K/uL Final  . Lymphocytes Relative 11/03/2018 13  % Final  . Lymphs Abs 11/03/2018 1.0  0.7 - 4.0 K/uL Final  . Monocytes Relative 11/03/2018 7  % Final  . Monocytes Absolute 11/03/2018 0.6  0.1 - 1.0 K/uL Final  . Eosinophils Relative 11/03/2018 0  % Final  . Eosinophils Absolute 11/03/2018 0.0  0.0 - 0.5 K/uL Final  . Basophils Relative 11/03/2018 0  % Final  . Basophils Absolute 11/03/2018 0.0  0.0 - 0.1 K/uL Final  . Immature Granulocytes 11/03/2018 1  % Final  . Abs Immature Granulocytes 11/03/2018 0.05  0.00 - 0.07 K/uL Final   Performed at [facility], [address] [place], [place] [address]    Assessment:  Andres Hebert is a 85 y.o. male  with metastaticadenocarcinoma of thelungs/p right sided thoracentesis on 10/06/2018.Cytologyconfirmed non-small cell carcinoma, favor adenocarcinoma of the lung.He has a 25-30 pack year smoking history.He has clinical [id number].  Chest CT angiogramon 10/05/2018 revealed a 8.4 x 6.9 cm right lower lobe mass with numerous  pleural nodules and a large right pleural effusion. There was bony destruction of the right lateral 7th and 8th ribs. There was no pulmonary embolism.  Head MRIon 10/07/2018 revealed no metastatic disease or acute intracranial abnormality.There was distal left vertebral artery with poor flow or occlusion, most likely due to atherosclerosis.  PET scanon 10/18/2018 revealed a large hypermetabolic RIGHT lower lobe mass consistent with bronchogenic carcinoma. There was multifocal pleural metastasis within the RIGHT hemithorax. There was one pleural metastatic lesion invading adjacent RIGHT seventh rib, and moderate RIGHT effusion. There was probable RIGHT hilar metastatic adenopathy, and no mediastinal or supraclavicular metastatic adenopathy.   He is s/p thoracentesis x 2 (10/06/2018 and 10/30/2018).  CT biopsy of the right lower lobe lung mass on 10/31/2018 was performed for mutational studies.  He received the influenza vaccineon 10/13/2018.  Symptomatically, he is fatigued.  He is dehydrated and orthostatic.  He denies any fever.  Hear rate is variable (low 30s to 150s).  Plan: 1.   Labs today: CBC with diff, BMP. 2.   Stage IV adenocarcinoma of the lung Patient is day 12 s/p cycle #1 Alimta and pembrolizumab.  He tolerated his chemotherapy well without nausea, vomiting or diarrhea.  He has less RUQ/RLL rib pain.  He has a persistent small right lower lobe effusion.  Counts have dropped.  Follow-up [facility] testing on lung biopsy to determine if other treatment options. Future treatment options based on mutational analaysis (EGFR, ALK, ROS1, BRAF, MET, RET, PDL-1). If targetable mutation- directed therapy. If no targetable mutation and PDL-1 >50% consider   pembrolizumab alone. If no targetable mutation and PDL-1 <50% or unknown continue Alimta  + pembrolizumab +/- carboplatin. Discuss symptom management.  He has antiemetics and pain medications at home to use on a prn bases.  Interventions are adequate.    3.   Weight loss Patient has lost 3 pounds in past 11 days.  Oral intake is poor.  He is dehydrated.. 4. Dehydration and heart rate variability  Initial monitoring in clinic variable heart rate low 30s to 150s.  Initial vitals sitting and standing:  80/50 pulse 79 sitting and 47/28 pulse 67 standing.   Patient received 1 liter NS.  Repeat vitals sitting and standing:  104/71 pulse 118 sitting and 80/51 pulse 48 standing   Patient received 2nd liter of NS.  Patient voided.  Repeat vitals sitting and standing: 102/67 pulse 121 sitting and 88/59 pulse 88 standing.  Concern for atrial fibrillation.    Spoke with Dr End who recommended ER evaluation, EKG and cardiac monitoring. 5.   Mild thrombocytopenia and leukopenia  Platelet count 76,000.  WBC 2900 (ANC 1600).  Expected nadir post chemotherapy.  No excess bruising.  Hemoptysis unchanged.  No fever.  Review neutropenic precautions.  Check counts before weekend to document recovery. 6.   Bone metastasis Patient has right lower rib metastasis seen on chest CT. No other sites of metastasis on bone scan. Continue to hold Xgeva for now. 7 Cancer-related pain Pain is well controlled with minimal use of Percocet. Continue Percocet 5-325 1 tablet every4hours prn pain (last Rx 10/24/2018). 8.   Patient to ER. 9.   RTC on 10/23/202 for MD assessment, labs (CBC with diff, BMP), and +/- IVF.  I discussed the assessment and treatment plan with the patient.  The patient was provided an opportunity to ask questions and all were answered.  The patient agreed with the plan and demonstrated an understanding of the instructions.  The patient was advised to call back if the symptoms worsen or if the  condition fails to improve as anticipated.  I provided 29 minutes of face-to-face time during this this encounter and > 50% was spent counseling as documented under my assessment and plan.    Lequita Asal, MD, PhD    11/07/2018, 9:05 AM  I, Selena Batten, am acting as scribe for [employer]. Mike Gip, MD, PhD.  I, Melissa C. Mike Gip, MD, have reviewed the above documentation for accuracy and completeness, and I agree with the above.

## 2018-11-10 ENCOUNTER — Telehealth: Payer: Self-pay

## 2018-11-10 NOTE — Telephone Encounter (Signed)
Reach out to the patient several times today and didn't get a answer or unable to leave a message /   No return call. Attempted

## 2018-11-14 ENCOUNTER — Other Ambulatory Visit: Payer: Self-pay | Admitting: Hematology and Oncology

## 2018-11-14 ENCOUNTER — Other Ambulatory Visit: Payer: Self-pay

## 2018-11-14 ENCOUNTER — Encounter: Payer: Self-pay | Admitting: Hematology and Oncology

## 2018-11-14 ENCOUNTER — Inpatient Hospital Stay: Payer: Medicare HMO

## 2018-11-14 ENCOUNTER — Inpatient Hospital Stay (HOSPITAL_BASED_OUTPATIENT_CLINIC_OR_DEPARTMENT_OTHER): Payer: Medicare HMO | Admitting: Hematology and Oncology

## 2018-11-14 ENCOUNTER — Encounter: Payer: Self-pay | Admitting: Emergency Medicine

## 2018-11-14 ENCOUNTER — Emergency Department
Admission: EM | Admit: 2018-11-14 | Discharge: 2018-11-14 | Disposition: A | Discharge status: Home / Self Care | Payer: Medicare HMO | Attending: Emergency Medicine | Admitting: Emergency Medicine

## 2018-11-14 VITALS — BP 80/50 | HR 79 | Temp 98.7°F | Resp 16 | Ht 66.0 in | Wt 145.6 lb

## 2018-11-14 VITALS — BP 88/59 | HR 88 | Resp 16

## 2018-11-14 DIAGNOSIS — C3431 Malignant neoplasm of lower lobe, right bronchus or lung: Secondary | ICD-10-CM

## 2018-11-14 DIAGNOSIS — I10 Essential (primary) hypertension: Secondary | ICD-10-CM | POA: Diagnosis not present

## 2018-11-14 DIAGNOSIS — Z87891 Personal history of nicotine dependence: Secondary | ICD-10-CM | POA: Insufficient documentation

## 2018-11-14 DIAGNOSIS — C7951 Secondary malignant neoplasm of bone: Secondary | ICD-10-CM

## 2018-11-14 DIAGNOSIS — G893 Neoplasm related pain (acute) (chronic): Secondary | ICD-10-CM

## 2018-11-14 DIAGNOSIS — R531 Weakness: Secondary | ICD-10-CM | POA: Insufficient documentation

## 2018-11-14 DIAGNOSIS — Z7982 Long term (current) use of aspirin: Secondary | ICD-10-CM | POA: Insufficient documentation

## 2018-11-14 DIAGNOSIS — J90 Pleural effusion, not elsewhere classified: Secondary | ICD-10-CM

## 2018-11-14 DIAGNOSIS — J449 Chronic obstructive pulmonary disease, unspecified: Secondary | ICD-10-CM | POA: Insufficient documentation

## 2018-11-14 DIAGNOSIS — Z79899 Other long term (current) drug therapy: Secondary | ICD-10-CM | POA: Diagnosis not present

## 2018-11-14 DIAGNOSIS — R000 Tachycardia, unspecified: Secondary | ICD-10-CM | POA: Diagnosis present

## 2018-11-14 DIAGNOSIS — E119 Type 2 diabetes mellitus without complications: Secondary | ICD-10-CM | POA: Insufficient documentation

## 2018-11-14 DIAGNOSIS — E860 Dehydration: Secondary | ICD-10-CM

## 2018-11-14 LAB — BASIC METABOLIC PANEL
Anion gap: 8 (ref 5–15)
BUN: 20 mg/dL (ref 8–23)
CO2: 27 mmol/L (ref 22–32)
Calcium: 8.5 mg/dL — ABNORMAL LOW (ref 8.9–10.3)
Chloride: 95 mmol/L — ABNORMAL LOW (ref 98–111)
Creatinine, Ser: 1.08 mg/dL (ref 0.61–1.24)
GFR calc Af Amer: >60 mL/min (ref >60)
GFR calc non Af Amer: >60 mL/min (ref >60)
Glucose, Bld: 216 mg/dL — ABNORMAL HIGH (ref 70–99)
Potassium: 3.5 mmol/L (ref 3.5–5.1)
Sodium: 130 mmol/L — ABNORMAL LOW (ref 135–145)

## 2018-11-14 LAB — CBC WITH DIFFERENTIAL/PLATELET
Abs Immature Granulocytes: 0.01 10*3/uL (ref 0.00–0.07)
Basophils Absolute: 0 10*3/uL (ref 0.0–0.1)
Basophils Relative: 0 %
Eosinophils Absolute: 0 10*3/uL (ref 0.0–0.5)
Eosinophils Relative: 1 %
HCT: 37.6 % — ABNORMAL LOW (ref 39.0–52.0)
Hemoglobin: 12.6 g/dL — ABNORMAL LOW (ref 13.0–17.0)
Immature Granulocytes: 0 %
Lymphocytes Relative: 29 %
Lymphs Abs: 0.9 10*3/uL (ref 0.7–4.0)
MCH: 30.9 pg (ref 26.0–34.0)
MCHC: 33.5 g/dL (ref 30.0–36.0)
MCV: 92.2 fL (ref 80.0–100.0)
Monocytes Absolute: 0.5 10*3/uL (ref 0.1–1.0)
Monocytes Relative: 17 %
Neutro Abs: 1.6 10*3/uL — ABNORMAL LOW (ref 1.7–7.7)
Neutrophils Relative %: 53 %
Platelets: 76 10*3/uL — ABNORMAL LOW (ref 150–400)
RBC: 4.08 MIL/uL — ABNORMAL LOW (ref 4.22–5.81)
RDW: 12.5 % (ref 11.5–15.5)
WBC: 2.9 10*3/uL — ABNORMAL LOW (ref 4.0–10.5)
nRBC: 0 % (ref 0.0–0.2)

## 2018-11-14 MED ORDER — SODIUM CHLORIDE 0.9 % IV SOLN
Freq: Once | INTRAVENOUS | Status: AC
  Administered 2018-11-14: 11:00:00 via INTRAVENOUS
  Filled 2018-11-14: qty 250

## 2018-11-14 MED ORDER — SODIUM CHLORIDE 0.9 % IV SOLN
Freq: Once | INTRAVENOUS | Status: AC
  Administered 2018-11-14: 10:00:00 via INTRAVENOUS
  Filled 2018-11-14: qty 250

## 2018-11-14 NOTE — ED Notes (Signed)
Per Dr. Jari Pigg, add-on troponin, tsh, and T4 free to labs drawn this morning if possible. Called and spoke with lab who states they have his blood and can add on ordered tests.

## 2018-11-14 NOTE — Patient Instructions (Signed)
Dehydration, Adult  Dehydration is when there is not enough fluid or water in your body. This happens when you lose more fluids than you take in. Dehydration can range from mild to very bad. It should be treated right away to keep it from getting very bad. Symptoms of mild dehydration may include:  Thirst.  Dry lips.  Slightly dry mouth.  Dry, warm skin.  Dizziness. Symptoms of moderate dehydration may include:  Very dry mouth.  Muscle cramps.  Dark pee (urine). Pee may be the color of tea.  Your body making less pee.  Your eyes making fewer tears.  Heartbeat that is uneven or faster than normal (palpitations).  Headache.  Light-headedness, especially when you stand up from sitting.  Fainting (syncope). Symptoms of very bad dehydration may include:  Changes in skin, such as: ? Cold and clammy skin. ? Blotchy (mottled) or pale skin. ? Skin that does not quickly return to normal after being lightly pinched and let go (poor skin turgor).  Changes in body fluids, such as: ? Feeling very thirsty. ? Your eyes making fewer tears. ? Not sweating when body temperature is high, such as in hot weather. ? Your body making very little pee.  Changes in vital signs, such as: ? Weak pulse. ? Pulse that is more than 100 beats a minute when you are sitting still. ? Fast breathing. ? Low blood pressure.  Other changes, such as: ? Sunken eyes. ? Cold hands and feet. ? Confusion. ? Lack of energy (lethargy). ? Trouble waking up from sleep. ? Short-term weight loss. ? Unconsciousness. Follow these instructions at home:   If told by your doctor, drink an ORS: ? Make an ORS by using instructions on the package. ? Start by drinking small amounts, about  cup (120 mL) every 5-10 minutes. ? Slowly drink more until you have had the amount that your doctor said to have.  Drink enough clear fluid to keep your pee clear or pale yellow. If you were told to drink an ORS, finish the  ORS first, then start slowly drinking clear fluids. Drink fluids such as: ? Water. Do not drink only water by itself. Doing that can make the salt (sodium) level in your body get too low (hyponatremia). ? Ice chips. ? Fruit juice that you have added water to (diluted). ? Low-calorie sports drinks.  Avoid: ? Alcohol. ? Drinks that have a lot of sugar. These include high-calorie sports drinks, fruit juice that does not have water added, and soda. ? Caffeine. ? Foods that are greasy or have a lot of fat or sugar.  Take over-the-counter and prescription medicines only as told by your doctor.  Do not take salt tablets. Doing that can make the salt level in your body get too high (hypernatremia).  Eat foods that have minerals (electrolytes). Examples include bananas, oranges, potatoes, tomatoes, and spinach.  Keep all follow-up visits as told by your doctor. This is important. Contact a doctor if:  You have belly (abdominal) pain that: ? Gets worse. ? Stays in one area (localizes).  You have a rash.  You have a stiff neck.  You get angry or annoyed more easily than normal (irritability).  You are more sleepy than normal.  You have a harder time waking up than normal.  You feel: ? Weak. ? Dizzy. ? Very thirsty.  You have peed (urinated) only a small amount of very dark pee during 6-8 hours. Get help right away if:  You have   symptoms of very bad dehydration.  You cannot drink fluids without throwing up (vomiting).  Your symptoms get worse with treatment.  You have a fever.  You have a very bad headache.  You are throwing up or having watery poop (diarrhea) and it: ? Gets worse. ? Does not go away.  You have blood or something green (bile) in your throw-up.  You have blood in your poop (stool). This may cause poop to look black and tarry.  You have not peed in 6-8 hours.  You pass out (faint).  Your heart rate when you are sitting still is more than 100 beats a  minute.  You have trouble breathing. This information is not intended to replace advice given to you by your health care provider. Make sure you discuss any questions you have with your health care provider. Document Released: 11/07/2008 Document Revised: 12/24/2016 Document Reviewed: 03/07/2015 Elsevier Patient Education  2020 [employer] Inc.  

## 2018-11-14 NOTE — ED Triage Notes (Signed)
Patient sent from [facility] (Dr. Cindee Lame) due to EKG concerning for irregular heart rate. Patient currently getting chemo for lung cancer. Patient reports his last treatment was Friday. Patient's wife states he was given 2 liters of fluid prior to leaving. Patient reports dizziness but states this is baseline for him.

## 2018-11-14 NOTE — Discharge Instructions (Addendum)
Follow-up with Dr. Mike Gip this week as planned and with your primary care doctor.  Return to the ER immediately for new, worsening, or persistent severe weakness, lightheadedness, feeling like you are going to pass out, difficulty breathing, chest pain, palpitations, fever, or any other new or worsening symptoms that concern you.

## 2018-11-14 NOTE — ED Notes (Signed)
Pt reports to ED from cancer center. Reports he was told to come to the ED for "irregular heart rate". Pt reports being treated for lung cancer and starting chemo a week and a half ago. Reports fatigue and swelling in both ankles that started with chemo. Denies chest pain or chest discomfort. Pt on cardiac monitoring at this moment in NSR.  Family at bedside.

## 2018-11-14 NOTE — ED Provider Notes (Signed)
[facility] Emergency Department Provider Note ____________________________________________   First MD Initiated Contact with Patient 11/14/18 1609     (approximate)  I have reviewed the triage vital signs and the nursing notes.   HISTORY  Chief Complaint Irregular Heart Beat    HPI Andres Hebert is a 85 y.o. male with PMH as noted below including stage IV adenocarcinoma of the lung who presents for evaluation after an episode of tachycardia and hypotension today while at the outpatient clinic.  The patient states that he has been feeling somewhat weak and is restless at night.  This has been going on for several weeks.  He states that he was feeling somewhat weaker than usual this morning at the clinic.  At that time he was noted to have an irregular heart rate ranging from the 30s to the 150s, and had a low blood pressure.  He was given 2 L of normal saline with normalization of his vital signs, but was instructed to come to the ED for further evaluation.  He states he feels much better now.  Past Medical History:  Diagnosis Date  . BPV (benign positional vertigo)   . Chronic constipation   . Diabetes mellitus without complication ([place])   . Hyperlipidemia   . Hypertension     Patient Active Problem List   Diagnosis Date Noted  . Encounter for antineoplastic chemotherapy 11/03/2018  . Encounter for antineoplastic immunotherapy 11/03/2018  . Cancer ([place])   . Palliative care encounter   . COPD exacerbation ([place]) 10/28/2018  . Primary adenocarcinoma of lower lobe of right lung ([place]) 10/13/2018  . Bone metastasis ([place]) 10/13/2018  . Goals of care, counseling/discussion 10/13/2018  . Cancer-related pain 10/13/2018  . Pleural effusion 10/05/2018  . Diabetes mellitus type 2, uncomplicated ([place]) 09/60/4540    History reviewed. No pertinent surgical history.  Prior to Admission medications   Medication Sig Start Date End Date Taking? Authorizing  Provider  acetaminophen (TYLENOL) 325 MG tablet Take 2 tablets (650 mg total) by mouth every 6 (six) hours as needed for mild pain (or Fever >/= 101). Patient not taking: Reported on 11/03/2018 10/31/18   Nicholes Mango, MD  amLODipine (NORVASC) 5 MG tablet Take 5 mg by mouth daily.    [provider]  aspirin 81 MG chewable tablet Chew 81 mg by mouth daily.    [provider]  citalopram (CELEXA) 10 MG tablet Take 1 tablet by mouth daily. 11/14/17 11/14/18  [provider]  dexamethasone (DECADRON) 4 MG tablet Take 1 tab two times a day the day before Alimta chemo, then take 2 tabs once a day for 3 days starting the day after chemo. Patient not taking: Reported on 11/14/2018 11/02/18   Lequita Asal, MD  feeding supplement, ENSURE ENLIVE, (ENSURE ENLIVE) LIQD Take 237 mLs by mouth 3 (three) times daily between meals. 10/08/18   Stark Jock Jude, MD  folic acid (FOLVITE) 1 MG tablet Take 1 tablet (1 mg total) by mouth daily. 10/24/18   Lequita Asal, MD  folic acid (FOLVITE) 1 MG tablet Take 1 tablet (1 mg total) by mouth daily. Start 5-7 days before Alimta chemotherapy. Continue until 21 days after Alimta completed. 11/02/18   Lequita Asal, MD  metoprolol tartrate (LOPRESSOR) 25 MG tablet Take 0.5 tablets (12.5 mg total) by mouth 2 (two) times daily. 10/31/18   Gouru, Illene Silver, MD  ondansetron (ZOFRAN) 8 MG tablet Take 1 tablet (8 mg total) by mouth 2 (two)  times daily as needed (Nausea or vomiting). Start if needed on the third day after chemotherapy. Patient not taking: Reported on 11/03/2018 11/02/18   Lequita Asal, MD  oxyCODONE-acetaminophen (PERCOCET/ROXICET) 5-325 MG tablet Take 1 tablet by mouth every 4 (four) hours as needed for moderate pain. 10/24/18   Lequita Asal, MD  polyethylene glycol (MIRALAX / GLYCOLAX) 17 g packet Take 17 g by mouth 2 (two) times daily. Patient not taking: Reported on 11/14/2018 10/31/18   Nicholes Mango, MD  potassium chloride  (K-DUR,KLOR-CON) 10 MEQ tablet Take 10 mEq by mouth 2 (two) times daily.    [provider]  pravastatin (PRAVACHOL) 40 MG tablet Take 40 mg by mouth daily.    [provider]  predniSONE (STERAPRED UNI-PAK 21 TAB) 10 MG (21) TBPK tablet Take 1 tablet (10 mg total) by mouth daily. Take 6 tablets by mouth for 1 day followed by  5 tablets by mouth for 1 day followed by  4 tablets by mouth for 1 day followed by  3 tablets by mouth for 1 day followed by  2 tablets by mouth for 1 day followed by  1 tablet by mouth for a day and stop Patient not taking: Reported on 11/03/2018 10/31/18   Nicholes Mango, MD  senna (SENOKOT) 8.6 MG TABS tablet Take 1 tablet (8.6 mg total) by mouth daily as needed for mild constipation. Patient not taking: Reported on 11/14/2018 10/08/18   Otila Back, MD  triamterene-hydrochlorothiazide (MAXZIDE-25) 37.5-25 MG tablet Take 1 tablet by mouth daily.    [provider]    Allergies Accupril [quinapril hcl]  Family History  Problem Relation Age of Onset  . Stroke Mother   . Prostate cancer Father     Social History Social History   Tobacco Use  . Smoking status: Former Smoker    Packs/day: 1.00    Years: 25.00    Pack years: 25.00    Types: Cigarettes    Quit date: 1976    Years since quitting: 44.8  . Smokeless tobacco: Never Used  Substance Use Topics  . Alcohol use: Not Currently  . Drug use: Never    Review of Systems  Constitutional: No fever. Eyes: No redness. ENT: No sore throat. Cardiovascular: Denies chest pain.  Denies palpitations. Respiratory: Denies shortness of breath. Gastrointestinal: No vomiting or diarrhea.  Genitourinary: Negative for dysuria.  Musculoskeletal: Negative for back pain. Skin: Negative for rash. Neurological: Negative for headache.   ____________________________________________   PHYSICAL EXAM:  VITAL SIGNS: ED Triage Vitals  Enc Vitals Group     BP 11/14/18 1407 (!) 119/57      Pulse Rate 11/14/18 1407 83     Resp 11/14/18 1407 16     Temp 11/14/18 1407 97.6 F (36.4 C)     Temp Source 11/14/18 1407 Oral     SpO2 11/14/18 1407 97 %     Weight 11/14/18 1409 145 lb 8.1 oz (66 kg)     Height 11/14/18 1409 5\' 6"  (1.676 m)     Head Circumference --      Peak Flow --      Pain Score 11/14/18 1408 5     Pain Loc --      Pain Edu? --      Excl. in [place]? --     Constitutional: Alert and oriented.  Relatively well appearing and in no acute distress. Eyes: Conjunctivae are normal.  Head: Atraumatic. Nose: No congestion/rhinnorhea. Mouth/Throat: Mucous membranes are moist.  Neck: Normal range of motion.  Cardiovascular: Normal rate, regular rhythm. Grossly normal heart sounds.  Good peripheral circulation. Respiratory: Normal respiratory effort.  No retractions. Lungs CTAB. Gastrointestinal: Soft and nontender. No distention.  Genitourinary: No flank tenderness. Musculoskeletal: Trace bilateral lower extremity edema.  Extremities warm and well perfused.  Neurologic:  Normal speech and language. No gross focal neurologic deficits are appreciated.  Skin:  Skin is warm and dry. No rash noted. Psychiatric: Mood and affect are normal. Speech and behavior are normal.  ____________________________________________   LABS (all labs ordered are listed, but only abnormal results are displayed)  Labs Reviewed  T4, FREE  TSH  TROPONIN I (HIGH SENSITIVITY)   ____________________________________________  EKG  ED ECG REPORT I, Arta Silence, the attending physician, personally viewed and interpreted this ECG.  Date: 11/14/2018 EKG Time: 1417 Rate: 81 Rhythm: normal sinus rhythm QRS Axis: normal Intervals: normal ST/T Wave abnormalities: normal Narrative Interpretation: no evidence of acute ischemia  ____________________________________________  RADIOLOGY    ____________________________________________   PROCEDURES  Procedure(s) performed: No   Procedures  Critical Care performed: No ____________________________________________   INITIAL IMPRESSION / ASSESSMENT AND PLAN / ED COURSE  Pertinent labs & imaging results that were available during my care of the patient were reviewed by me and considered in my medical decision making (see chart for details).  85 year old male with PMH as noted above including a history of stage IV adenocarcinoma of the right lung now 12 days out from his last chemo presents after he was noted to be hypotensive and with an irregular heart rate while at the clinic today.  I reviewed the past notes in Epic including the notes from this morning.  The patient had a heart rate measured from the 30s to the 120s although this was measured just with a pulse oximeter.  He was noted to be hypoxic on room air although his saturation normalized with 2 L by nasal cannula.  He is on oxygen at home.  He was hypotensive when standing initially, but this resolved after he was given 2 L of normal saline.  On exam currently, the vital signs are normal.  The patient is relatively well-appearing.  The physical exam is otherwise unremarkable.  EKG is currently normal.  Overall the etiology of the patient's symptoms and abnormal vital signs earlier today is unclear.  Basic labs were obtained and show borderline low sodium and platelets although no other significant abnormalities.  At this time, the patient states that he feels well and would strongly prefer to go home without any further work-up.  I explained to the patient that without doing additional tests including cardiac enzymes, I cannot rule out that he had an event causing strain on his heart or a possible heart attack.  I told him that I was concerned about the low blood pressure although it had now been resolved for 6 hours.  I offered admission overnight for further observation and some additional work-up.  However, the patient states that unless there is an absolute  reason that he needs to stay, he wants to go home.  At this time, the patient demonstrates appropriate decision-making capacity.  He demonstrated understanding of the relevant risks and was able to paraphrase them back to me.  He understands that without further work-up and observation I cannot rule out causes of his symptoms that could cause permanent disability or death.  However, he agrees to return immediately if he starts to feel worse or has any recurrent symptoms.  He states he will follow-up with his doctors this week.  Return precautions provided.   ____________________________________________   FINAL CLINICAL IMPRESSION(S) / ED DIAGNOSES  Final diagnoses:  Tachycardia  Generalized weakness      NEW MEDICATIONS STARTED DURING THIS VISIT:  New Prescriptions   No medications on file     Note:  This document was prepared using Dragon voice recognition software and may include unintentional dictation errors.    Arta Silence, MD 11/14/18 1705

## 2018-11-14 NOTE — ED Notes (Signed)
Spoke with lab who now reports they do not have blood that was drawn this morning. States they will need to have new blood drawn to run tests

## 2018-11-14 NOTE — Progress Notes (Signed)
Patient says that he is "wobbly" but has not fallen.  He says he is weak and does not get out of the house.  He has enjoyed his wife's brun[place] stew as of yesterday, boost drink this morning and sometimes feels feel and does not want to eat.  His pulse randomly goes up and down from 25-101 as he is sitting in the wheelchair.  O2 2L [place] placed on patient for 80%RA 02 sat.  While on 2 L he is at 99% 02sat.  Patient is able to carry on a conversation and is alert and oriented x 3.  BP 104/71, HR 118 sitting and 80/51, HR 48 standing.  A total of 2 Liters of NS was given.  Patient voided once.  Vital signs:  102/67, HR 121 sitting and 88/59, HR 88 while standing after fluids.  Patient sent to ER for cardiac work-up per MD.

## 2018-11-14 NOTE — Progress Notes (Signed)
The patient c/o having trouble getting up and down by hisself, more weaker need assistance with ADL's. Weight loss 3 lb. Not eating only had a ensure for breakfast today. The wife has verified the patient information. The patient presented to clinic without his oxygen and was only sat % 78-80%. The patient was given 2 liters of oxygen and 02 sat 97%

## 2018-11-17 ENCOUNTER — Other Ambulatory Visit: Payer: Medicare HMO

## 2018-11-17 ENCOUNTER — Ambulatory Visit: Payer: Medicare HMO

## 2018-11-19 ENCOUNTER — Other Ambulatory Visit: Payer: Self-pay

## 2018-11-19 ENCOUNTER — Emergency Department: Payer: Medicare HMO

## 2018-11-19 ENCOUNTER — Emergency Department
Admission: EM | Admit: 2018-11-19 | Discharge: 2018-11-19 | Disposition: A | Discharge status: Home / Self Care | Payer: Medicare HMO | Attending: Emergency Medicine | Admitting: Emergency Medicine

## 2018-11-19 ENCOUNTER — Encounter: Payer: Self-pay | Admitting: Emergency Medicine

## 2018-11-19 DIAGNOSIS — Z85118 Personal history of other malignant neoplasm of bronchus and lung: Secondary | ICD-10-CM | POA: Diagnosis not present

## 2018-11-19 DIAGNOSIS — R339 Retention of urine, unspecified: Secondary | ICD-10-CM | POA: Diagnosis present

## 2018-11-19 DIAGNOSIS — I10 Essential (primary) hypertension: Secondary | ICD-10-CM | POA: Diagnosis not present

## 2018-11-19 DIAGNOSIS — Z7982 Long term (current) use of aspirin: Secondary | ICD-10-CM | POA: Diagnosis not present

## 2018-11-19 DIAGNOSIS — E119 Type 2 diabetes mellitus without complications: Secondary | ICD-10-CM | POA: Insufficient documentation

## 2018-11-19 DIAGNOSIS — Z87891 Personal history of nicotine dependence: Secondary | ICD-10-CM | POA: Diagnosis not present

## 2018-11-19 DIAGNOSIS — J449 Chronic obstructive pulmonary disease, unspecified: Secondary | ICD-10-CM | POA: Diagnosis not present

## 2018-11-19 DIAGNOSIS — Z79899 Other long term (current) drug therapy: Secondary | ICD-10-CM | POA: Insufficient documentation

## 2018-11-19 LAB — BASIC METABOLIC PANEL
Anion gap: 12 (ref 5–15)
BUN: 11 mg/dL (ref 8–23)
CO2: 29 mmol/L (ref 22–32)
Calcium: 9 mg/dL (ref 8.9–10.3)
Chloride: 96 mmol/L — ABNORMAL LOW (ref 98–111)
Creatinine, Ser: 0.69 mg/dL (ref 0.61–1.24)
GFR calc Af Amer: >60 mL/min (ref >60)
GFR calc non Af Amer: >60 mL/min (ref >60)
Glucose, Bld: 121 mg/dL — ABNORMAL HIGH (ref 70–99)
Potassium: 3.3 mmol/L — ABNORMAL LOW (ref 3.5–5.1)
Sodium: 137 mmol/L (ref 135–145)

## 2018-11-19 LAB — CBC WITH DIFFERENTIAL/PLATELET
Abs Immature Granulocytes: 0.03 10*3/uL (ref 0.00–0.07)
Basophils Absolute: 0 10*3/uL (ref 0.0–0.1)
Basophils Relative: 0 %
Eosinophils Absolute: 0.1 10*3/uL (ref 0.0–0.5)
Eosinophils Relative: 1 %
HCT: 37.5 % — ABNORMAL LOW (ref 39.0–52.0)
Hemoglobin: 12.3 g/dL — ABNORMAL LOW (ref 13.0–17.0)
Immature Granulocytes: 1 %
Lymphocytes Relative: 24 %
Lymphs Abs: 1.1 10*3/uL (ref 0.7–4.0)
MCH: 30.4 pg (ref 26.0–34.0)
MCHC: 32.8 g/dL (ref 30.0–36.0)
MCV: 92.8 fL (ref 80.0–100.0)
Monocytes Absolute: 0.5 10*3/uL (ref 0.1–1.0)
Monocytes Relative: 12 %
Neutro Abs: 2.7 10*3/uL (ref 1.7–7.7)
Neutrophils Relative %: 62 %
Platelets: 195 10*3/uL (ref 150–400)
RBC: 4.04 MIL/uL — ABNORMAL LOW (ref 4.22–5.81)
RDW: 12.8 % (ref 11.5–15.5)
WBC: 4.3 10*3/uL (ref 4.0–10.5)
nRBC: 0 % (ref 0.0–0.2)

## 2018-11-19 LAB — URINALYSIS, COMPLETE (UACMP) WITH MICROSCOPIC
Bacteria, UA: NONE SEEN
Bilirubin Urine: NEGATIVE
Glucose, UA: NEGATIVE mg/dL
Hgb urine dipstick: NEGATIVE
Ketones, ur: NEGATIVE mg/dL
Leukocytes,Ua: NEGATIVE
Nitrite: NEGATIVE
Protein, ur: NEGATIVE mg/dL
Specific Gravity, Urine: 1.003 — ABNORMAL LOW (ref 1.005–1.030)
Squamous Epithelial / HPF: NONE SEEN (ref 0–5)
WBC, UA: NONE SEEN WBC/hpf (ref 0–5)
pH: 7 (ref 5.0–8.0)

## 2018-11-19 LAB — BRAIN NATRIURETIC PEPTIDE: B Natriuretic Peptide: 90 pg/mL (ref 0.0–100.0)

## 2018-11-19 MED ORDER — TAMSULOSIN HCL 0.4 MG PO CAPS: 0.4000 mg | ORAL_CAPSULE | Freq: Every day | ORAL | 0 refills | Status: DC

## 2018-11-19 NOTE — ED Triage Notes (Signed)
Pt to ED via POV c/o bilateral LE edema. Pt states that this has been going on for about 1 weeks. Pt has significant swelling to both feet. Pt denies hx/o heart failure. Pt is cancer pt, on Chemo. Pt states that he has also been having difficulty peeing. Pt is in NAD at this time.

## 2018-11-19 NOTE — ED Provider Notes (Signed)
[facility] Emergency Department Provider Note   ____________________________________________    I have reviewed the triage vital signs and the nursing notes.   HISTORY  Chief Complaint Urinary retention    HPI Andres Hebert is a 85 y.o. male with history of diabetes, hypertension, hyperlipidemia, lung CA, recent diagnosis, COPD who presents with complaints of urinary retention.  Patient reports over the last day he had a prolonged period of time where he was unable to urinate but felt that he needed to.  He was finally able to urinate a large amount just a couple of hours ago and was able to urinate again in the emergency department.  He denies abdominal pain.  No fevers or chills.  No dysuria.  Recently started metoprolol.  Does  describe that recently he has developed some swelling in the ankles bilaterally.  No calf pain.  Follows with Dr. Mike Gip of oncology  Past Medical History:  Diagnosis Date  . BPV (benign positional vertigo)   . Chronic constipation   . Diabetes mellitus without complication ([place])   . Hyperlipidemia   . Hypertension     Patient Active Problem List   Diagnosis Date Noted  . Encounter for antineoplastic chemotherapy 11/03/2018  . Encounter for antineoplastic immunotherapy 11/03/2018  . Cancer ([place])   . Palliative care encounter   . COPD exacerbation ([place]) 10/28/2018  . Primary adenocarcinoma of lower lobe of right lung ([place]) 10/13/2018  . Bone metastasis ([place]) 10/13/2018  . Goals of care, counseling/discussion 10/13/2018  . Cancer-related pain 10/13/2018  . Pleural effusion 10/05/2018  . Diabetes mellitus type 2, uncomplicated ([place]) 62/37/6283    History reviewed. No pertinent surgical history.  Prior to Admission medications   Medication Sig Start Date End Date Taking? Authorizing Provider  acetaminophen (TYLENOL) 325 MG tablet Take 2 tablets (650 mg total) by mouth every 6 (six) hours as needed for mild pain  (or Fever >/= 101). Patient not taking: Reported on 11/03/2018 10/31/18   Nicholes Mango, MD  amLODipine (NORVASC) 5 MG tablet Take 5 mg by mouth daily.    [provider]  aspirin 81 MG chewable tablet Chew 81 mg by mouth daily.    [provider]  citalopram (CELEXA) 10 MG tablet Take 1 tablet by mouth daily. 11/14/17 11/14/18  [provider]  dexamethasone (DECADRON) 4 MG tablet Take 1 tab two times a day the day before Alimta chemo, then take 2 tabs once a day for 3 days starting the day after chemo. Patient not taking: Reported on 11/14/2018 11/02/18   Lequita Asal, MD  feeding supplement, ENSURE ENLIVE, (ENSURE ENLIVE) LIQD Take 237 mLs by mouth 3 (three) times daily between meals. 10/08/18   Stark Jock Jude, MD  folic acid (FOLVITE) 1 MG tablet Take 1 tablet (1 mg total) by mouth daily. 10/24/18   Lequita Asal, MD  folic acid (FOLVITE) 1 MG tablet Take 1 tablet (1 mg total) by mouth daily. Start 5-7 days before Alimta chemotherapy. Continue until 21 days after Alimta completed. 11/02/18   Lequita Asal, MD  metoprolol tartrate (LOPRESSOR) 25 MG tablet Take 0.5 tablets (12.5 mg total) by mouth 2 (two) times daily. 10/31/18   Gouru, Illene Silver, MD  ondansetron (ZOFRAN) 8 MG tablet Take 1 tablet (8 mg total) by mouth 2 (two) times daily as needed (Nausea or vomiting). Start if needed on the third day after chemotherapy. Patient not taking: Reported on 11/03/2018 11/02/18   Lequita Asal,  MD  oxyCODONE-acetaminophen (PERCOCET/ROXICET) 5-325 MG tablet Take 1 tablet by mouth every 4 (four) hours as needed for moderate pain. 10/24/18   Lequita Asal, MD  polyethylene glycol (MIRALAX / GLYCOLAX) 17 g packet Take 17 g by mouth 2 (two) times daily. Patient not taking: Reported on 11/14/2018 10/31/18   Nicholes Mango, MD  potassium chloride (K-DUR,KLOR-CON) 10 MEQ tablet Take 10 mEq by mouth 2 (two) times daily.    [provider]  pravastatin (PRAVACHOL) 40  MG tablet Take 40 mg by mouth daily.    [provider]  predniSONE (STERAPRED UNI-PAK 21 TAB) 10 MG (21) TBPK tablet Take 1 tablet (10 mg total) by mouth daily. Take 6 tablets by mouth for 1 day followed by  5 tablets by mouth for 1 day followed by  4 tablets by mouth for 1 day followed by  3 tablets by mouth for 1 day followed by  2 tablets by mouth for 1 day followed by  1 tablet by mouth for a day and stop Patient not taking: Reported on 11/03/2018 10/31/18   Nicholes Mango, MD  senna (SENOKOT) 8.6 MG TABS tablet Take 1 tablet (8.6 mg total) by mouth daily as needed for mild constipation. Patient not taking: Reported on 11/14/2018 10/08/18   Otila Back, MD  tamsulosin (FLOMAX) 0.4 MG CAPS capsule Take 1 capsule (0.4 mg total) by mouth daily. 11/19/18   Lavonia Drafts, MD  triamterene-hydrochlorothiazide (MAXZIDE-25) 37.5-25 MG tablet Take 1 tablet by mouth daily.    [provider]     Allergies Accupril [quinapril hcl]  Family History  Problem Relation Age of Onset  . Stroke Mother   . Prostate cancer Father     Social History Social History   Tobacco Use  . Smoking status: Former Smoker    Packs/day: 1.00    Years: 25.00    Pack years: 25.00    Types: Cigarettes    Quit date: 1976    Years since quitting: 44.8  . Smokeless tobacco: Never Used  Substance Use Topics  . Alcohol use: Not Currently  . Drug use: Never    Review of Systems  Constitutional: No fever/chills Eyes: No visual changes.  ENT: No sore throat. Cardiovascular: Denies chest pain. Respiratory: Denies shortness of breath. Gastrointestinal: No abdominal pain.  Genitourinary: Negative for dysuria. Musculoskeletal: Negative for back pain. Skin: Negative for rash. Neurological: Negative for headaches   ____________________________________________   PHYSICAL EXAM:  VITAL SIGNS: ED Triage Vitals  Enc Vitals Group     BP 11/19/18 1234 (!) 94/53     Pulse Rate 11/19/18 1234 70      Resp 11/19/18 1234 16     Temp 11/19/18 1234 98.8 F (37.1 C)     Temp Source 11/19/18 1234 Oral     SpO2 11/19/18 1234 96 %     Weight --      Height --      Head Circumference --      Peak Flow --      Pain Score 11/19/18 1250 0     Pain Loc --      Pain Edu? --      Excl. in [place]? --     Constitutional: Alert and oriented  Nose: No congestion/rhinnorhea. Mouth/Throat: Mucous membranes are moist.    Cardiovascular: Normal rate, regular rhythm.   Good peripheral circulation. Respiratory: Normal respiratory effort.  No retractions. Lungs CTAB. Gastrointestinal: Soft and nontender. No distention.  No CVA tenderness.  No edema Genitourinary: deferred Musculoskeletal: 2+ edema bilaterally to just above the ankle, no calf pain or swelling. Neurologic:  Normal speech and language. No gross focal neurologic deficits are appreciated.  Skin:  Skin is warm, dry and intact. No rash noted. Psychiatric: Mood and affect are normal. Speech and behavior are normal.  ____________________________________________   LABS (all labs ordered are listed, but only abnormal results are displayed)  Labs Reviewed  CBC WITH DIFFERENTIAL/PLATELET - Abnormal; Notable for the following components:      Result Value   RBC 4.04 (*)    Hemoglobin 12.3 (*)    HCT 37.5 (*)    All other components within normal limits  BASIC METABOLIC PANEL - Abnormal; Notable for the following components:   Potassium 3.3 (*)    Chloride 96 (*)    Glucose, Bld 121 (*)    All other components within normal limits  URINALYSIS, COMPLETE (UACMP) WITH MICROSCOPIC - Abnormal; Notable for the following components:   Color, Urine COLORLESS (*)    APPearance CLEAR (*)    Specific Gravity, Urine 1.003 (*)    All other components within normal limits  URINE CULTURE  BRAIN NATRIURETIC PEPTIDE   ____________________________________________  EKG  None ____________________________________________  RADIOLOGY  None  ____________________________________________   PROCEDURES  Procedure(s) performed: No  Procedures   Critical Care performed: No ____________________________________________   INITIAL IMPRESSION / ASSESSMENT AND PLAN / ED COURSE  Pertinent labs & imaging results that were available during my care of the patient were reviewed by me and considered in my medical decision making (see chart for details).  Patient well-appearing and in no acute distress, here for evaluation for urinary retention, seems to have resolved however as the patient is able to urinate 3 times in the last several hours which is reassuring.  No abdominal tenderness to palpation.  The only new medication is metoprolol, doubt this would cause urinary tension.  Does have a history of prostatic hypertrophy.  Will check labs, urinalysis  Lab work is overall reassuring, urinalysis is unremarkable, culture sent.  Will start on Flomax have the patient follow-up with urology.  Return precautions discussed    ____________________________________________   FINAL CLINICAL IMPRESSION(S) / ED DIAGNOSES  Final diagnoses:  Urinary retention        Note:  This document was prepared using Dragon voice recognition software and may include unintentional dictation errors.   Lavonia Drafts, MD 11/19/18 [phone]

## 2018-11-20 ENCOUNTER — Inpatient Hospital Stay: Payer: Medicare HMO

## 2018-11-20 ENCOUNTER — Encounter: Payer: Self-pay | Admitting: Hematology and Oncology

## 2018-11-20 ENCOUNTER — Other Ambulatory Visit: Payer: Self-pay

## 2018-11-20 ENCOUNTER — Inpatient Hospital Stay (HOSPITAL_BASED_OUTPATIENT_CLINIC_OR_DEPARTMENT_OTHER): Payer: Medicare HMO | Admitting: Hematology and Oncology

## 2018-11-20 VITALS — BP 95/57 | HR 71 | Temp 96.3°F | Resp 18 | Ht 66.0 in

## 2018-11-20 DIAGNOSIS — C3431 Malignant neoplasm of lower lobe, right bronchus or lung: Secondary | ICD-10-CM

## 2018-11-20 DIAGNOSIS — E860 Dehydration: Secondary | ICD-10-CM | POA: Diagnosis not present

## 2018-11-20 DIAGNOSIS — G893 Neoplasm related pain (acute) (chronic): Secondary | ICD-10-CM | POA: Diagnosis not present

## 2018-11-20 DIAGNOSIS — I951 Orthostatic hypotension: Secondary | ICD-10-CM

## 2018-11-20 DIAGNOSIS — C7951 Secondary malignant neoplasm of bone: Secondary | ICD-10-CM

## 2018-11-20 DIAGNOSIS — Z5112 Encounter for antineoplastic immunotherapy: Secondary | ICD-10-CM | POA: Diagnosis not present

## 2018-11-20 LAB — COMPREHENSIVE METABOLIC PANEL
ALT: 30 U/L (ref 0–44)
AST: 27 U/L (ref 15–41)
Albumin: 2.7 g/dL — ABNORMAL LOW (ref 3.5–5.0)
Alkaline Phosphatase: 56 U/L (ref 38–126)
Anion gap: 9 (ref 5–15)
BUN: 13 mg/dL (ref 8–23)
CO2: 28 mmol/L (ref 22–32)
Calcium: 8.5 mg/dL — ABNORMAL LOW (ref 8.9–10.3)
Chloride: 94 mmol/L — ABNORMAL LOW (ref 98–111)
Creatinine, Ser: 0.86 mg/dL (ref 0.61–1.24)
GFR calc Af Amer: >60 mL/min (ref >60)
GFR calc non Af Amer: >60 mL/min (ref >60)
Glucose, Bld: 243 mg/dL — ABNORMAL HIGH (ref 70–99)
Potassium: 3.5 mmol/L (ref 3.5–5.1)
Sodium: 131 mmol/L — ABNORMAL LOW (ref 135–145)
Total Bilirubin: 0.5 mg/dL (ref 0.3–1.2)
Total Protein: 5.9 g/dL — ABNORMAL LOW (ref 6.5–8.1)

## 2018-11-20 LAB — CBC WITH DIFFERENTIAL/PLATELET
Abs Immature Granulocytes: 0.03 10*3/uL (ref 0.00–0.07)
Basophils Absolute: 0 10*3/uL (ref 0.0–0.1)
Basophils Relative: 0 %
Eosinophils Absolute: 0 10*3/uL (ref 0.0–0.5)
Eosinophils Relative: 1 %
HCT: 34.4 % — ABNORMAL LOW (ref 39.0–52.0)
Hemoglobin: 11.5 g/dL — ABNORMAL LOW (ref 13.0–17.0)
Immature Granulocytes: 1 %
Lymphocytes Relative: 17 %
Lymphs Abs: 0.7 10*3/uL (ref 0.7–4.0)
MCH: 30.7 pg (ref 26.0–34.0)
MCHC: 33.4 g/dL (ref 30.0–36.0)
MCV: 92 fL (ref 80.0–100.0)
Monocytes Absolute: 0.5 10*3/uL (ref 0.1–1.0)
Monocytes Relative: 13 %
Neutro Abs: 2.9 10*3/uL (ref 1.7–7.7)
Neutrophils Relative %: 68 %
Platelets: 214 10*3/uL (ref 150–400)
RBC: 3.74 MIL/uL — ABNORMAL LOW (ref 4.22–5.81)
RDW: 13.1 % (ref 11.5–15.5)
WBC: 4.2 10*3/uL (ref 4.0–10.5)
nRBC: 0 % (ref 0.0–0.2)

## 2018-11-20 LAB — TSH: TSH: 2.685 u[IU]/mL (ref 0.350–4.500)

## 2018-11-20 MED ORDER — SODIUM CHLORIDE 0.9 % IV SOLN
Freq: Once | INTRAVENOUS | Status: AC
  Administered 2018-11-20: 11:00:00 via INTRAVENOUS
  Filled 2018-11-20: qty 250

## 2018-11-20 NOTE — Progress Notes (Signed)
Wife states bilateral feet swollen x couple days, the patient states he has not passed water since 9:00 pm 11/19/2018. Patient states he can't stand without feeling like he want to fall over. B/p today 80/52

## 2018-11-20 NOTE — Patient Instructions (Signed)
Dehydration, Adult  Dehydration is when there is not enough fluid or water in your body. This happens when you lose more fluids than you take in. Dehydration can range from mild to very bad. It should be treated right away to keep it from getting very bad. Symptoms of mild dehydration may include:  Thirst.  Dry lips.  Slightly dry mouth.  Dry, warm skin.  Dizziness. Symptoms of moderate dehydration may include:  Very dry mouth.  Muscle cramps.  Dark pee (urine). Pee may be the color of tea.  Your body making less pee.  Your eyes making fewer tears.  Heartbeat that is uneven or faster than normal (palpitations).  Headache.  Light-headedness, especially when you stand up from sitting.  Fainting (syncope). Symptoms of very bad dehydration may include:  Changes in skin, such as: ? Cold and clammy skin. ? Blotchy (mottled) or pale skin. ? Skin that does not quickly return to normal after being lightly pinched and let go (poor skin turgor).  Changes in body fluids, such as: ? Feeling very thirsty. ? Your eyes making fewer tears. ? Not sweating when body temperature is high, such as in hot weather. ? Your body making very little pee.  Changes in vital signs, such as: ? Weak pulse. ? Pulse that is more than 100 beats a minute when you are sitting still. ? Fast breathing. ? Low blood pressure.  Other changes, such as: ? Sunken eyes. ? Cold hands and feet. ? Confusion. ? Lack of energy (lethargy). ? Trouble waking up from sleep. ? Short-term weight loss. ? Unconsciousness. Follow these instructions at home:   If told by your doctor, drink an ORS: ? Make an ORS by using instructions on the package. ? Start by drinking small amounts, about  cup (120 mL) every 5-10 minutes. ? Slowly drink more until you have had the amount that your doctor said to have.  Drink enough clear fluid to keep your pee clear or pale yellow. If you were told to drink an ORS, finish the  ORS first, then start slowly drinking clear fluids. Drink fluids such as: ? Water. Do not drink only water by itself. Doing that can make the salt (sodium) level in your body get too low (hyponatremia). ? Ice chips. ? Fruit juice that you have added water to (diluted). ? Low-calorie sports drinks.  Avoid: ? Alcohol. ? Drinks that have a lot of sugar. These include high-calorie sports drinks, fruit juice that does not have water added, and soda. ? Caffeine. ? Foods that are greasy or have a lot of fat or sugar.  Take over-the-counter and prescription medicines only as told by your doctor.  Do not take salt tablets. Doing that can make the salt level in your body get too high (hypernatremia).  Eat foods that have minerals (electrolytes). Examples include bananas, oranges, potatoes, tomatoes, and spinach.  Keep all follow-up visits as told by your doctor. This is important. Contact a doctor if:  You have belly (abdominal) pain that: ? Gets worse. ? Stays in one area (localizes).  You have a rash.  You have a stiff neck.  You get angry or annoyed more easily than normal (irritability).  You are more sleepy than normal.  You have a harder time waking up than normal.  You feel: ? Weak. ? Dizzy. ? Very thirsty.  You have peed (urinated) only a small amount of very dark pee during 6-8 hours. Get help right away if:  You have   symptoms of very bad dehydration.  You cannot drink fluids without throwing up (vomiting).  Your symptoms get worse with treatment.  You have a fever.  You have a very bad headache.  You are throwing up or having watery poop (diarrhea) and it: ? Gets worse. ? Does not go away.  You have blood or something green (bile) in your throw-up.  You have blood in your poop (stool). This may cause poop to look black and tarry.  You have not peed in 6-8 hours.  You pass out (faint).  Your heart rate when you are sitting still is more than 100 beats a  minute.  You have trouble breathing. This information is not intended to replace advice given to you by your health care provider. Make sure you discuss any questions you have with your health care provider. Document Released: 11/07/2008 Document Revised: 12/24/2016 Document Reviewed: 03/07/2015 Elsevier Patient Education  2020 [employer] Inc.  

## 2018-11-20 NOTE — Progress Notes (Signed)
[facility]  [address] [place], [place] [address] Phone: [phone]  Fax: [phone]   Clinic Day:  11/20/2018  Referring physician: [name], MD  Chief Complaint: Andres Hebert is a 85 y.o. male with  stage IV adenocarcinoma of therightlung who is seen for sick call visit on day 18 s/p cycle #1 Alimta and pembrolizumab.  HPI: The patient was last seen in the medical oncology clinic on 11/14/2018. At that time, he felt fatigued.  He was dehydrated and orthostatic.  He denied any fever.  His heart rate is variable (low 30s to 150s). Hematocrit 37.6, hemoglobin 12.6, MCV 92.2, platelets 76,000, WBC 2900, ANC 1600.  He received 2 liters of IV fluids.  He felt better.  He was referred to the [facility] ER for an irregular rhythm on 11/14/2018. He felt weak and restlessness at night. EKG was normal.  He declined admission.  He was seen at [facility] ER for urinary retention on 11/19/2018. He had edema in both ankles.  He denied any calf pain. He was able to urinate large amount in ER. Labs revealed a ematocrit 37.6, hemoglobin 12.3, MCV 92.8, platelets 195,000, WBC 4300, ANC 2700. Creatinine was 0.69.  BNP was 90.0.  He was started on Flomax.  During the interim, he couldn't be seen on 11/17/2018 due to his wife's possible COVID exposure. Her results came back negative. His wife says he is very fatigued. He denies any nausea and vomiting s/p chemotherapy.  His wife says now that she is feeling better and can make meals for him,  he eats very well.  His wife says he is still coughing up chunks of blood (no change).  Symptomatically, he "wants to quit". He needs help to get up and go to the bathroom. He feels like "a baby laying in bed who can't do anything".    Past Medical History:  Diagnosis Date  . BPV (benign positional vertigo)   . Chronic constipation   . Diabetes mellitus without complication ([place])   . Hyperlipidemia   . Hypertension      History reviewed. No pertinent surgical history.  Family History  Problem Relation Age of Onset  . Stroke Mother   . Prostate cancer Father     Social History:  reports that he quit smoking about 44 years ago. His smoking use included cigarettes. He has a 25.00 pack-year smoking history. He has never used smokeless tobacco. He reports previous alcohol use. He reports that he does not use drugs. Hehas a 25-30 pack-year smoking history.He stoppedsmoking about 45 years ago.He denies any exposure to asbestos, radiation or toxins.He worked for [employer] and [employer] lives in [place].His wife's name isSheila. The patient is accompanied by his wife in person today.  Allergies:  Allergies  Allergen Reactions  . Accupril [Quinapril Hcl] Other (See Comments)    Reaction:dizziness per MR    Current Medications: Current Outpatient Medications  Medication Sig Dispense Refill  . acetaminophen (TYLENOL) 325 MG tablet Take 2 tablets (650 mg total) by mouth every 6 (six) hours as needed for mild pain (or Fever >/= 101). (Patient not taking: Reported on 11/03/2018)    . amLODipine (NORVASC) 5 MG tablet Take 5 mg by mouth daily.    Marland Kitchen aspirin 81 MG chewable tablet Chew 81 mg by mouth daily.    . citalopram (CELEXA) 10 MG tablet Take 1 tablet by mouth daily.    Marland Kitchen dexamethasone (DECADRON) 4 MG tablet Take 1 tab two times  a day the day before Alimta chemo, then take 2 tabs once a day for 3 days starting the day after chemo. (Patient not taking: Reported on 11/14/2018) 30 tablet 1  . feeding supplement, ENSURE ENLIVE, (ENSURE ENLIVE) LIQD Take 237 mLs by mouth 3 (three) times daily between meals. 161 mL 0  . folic acid (FOLVITE) 1 MG tablet Take 1 tablet (1 mg total) by mouth daily. 30 tablet 1  . folic acid (FOLVITE) 1 MG tablet Take 1 tablet (1 mg total) by mouth daily. Start 5-7 days before Alimta chemotherapy. Continue until 21 days after Alimta completed. 100 tablet 3  . metoprolol tartrate  (LOPRESSOR) 25 MG tablet Take 0.5 tablets (12.5 mg total) by mouth 2 (two) times daily. 60 tablet 0  . ondansetron (ZOFRAN) 8 MG tablet Take 1 tablet (8 mg total) by mouth 2 (two) times daily as needed (Nausea or vomiting). Start if needed on the third day after chemotherapy. (Patient not taking: Reported on 11/03/2018) 30 tablet 1  . oxyCODONE-acetaminophen (PERCOCET/ROXICET) 5-325 MG tablet Take 1 tablet by mouth every 4 (four) hours as needed for moderate pain. 30 tablet 0  . polyethylene glycol (MIRALAX / GLYCOLAX) 17 g packet Take 17 g by mouth 2 (two) times daily. (Patient not taking: Reported on 11/14/2018) 14 each 0  . potassium chloride (K-DUR,KLOR-CON) 10 MEQ tablet Take 10 mEq by mouth 2 (two) times daily.    . pravastatin (PRAVACHOL) 40 MG tablet Take 40 mg by mouth daily.    . predniSONE (STERAPRED UNI-PAK 21 TAB) 10 MG (21) TBPK tablet Take 1 tablet (10 mg total) by mouth daily. Take 6 tablets by mouth for 1 day followed by  5 tablets by mouth for 1 day followed by  4 tablets by mouth for 1 day followed by  3 tablets by mouth for 1 day followed by  2 tablets by mouth for 1 day followed by  1 tablet by mouth for a day and stop (Patient not taking: Reported on 11/03/2018) 21 tablet 0  . senna (SENOKOT) 8.6 MG TABS tablet Take 1 tablet (8.6 mg total) by mouth daily as needed for mild constipation. (Patient not taking: Reported on 11/14/2018) 30 tablet 0  . tamsulosin (FLOMAX) 0.4 MG CAPS capsule Take 1 capsule (0.4 mg total) by mouth daily. 30 capsule 0  . triamterene-hydrochlorothiazide (MAXZIDE-25) 37.5-25 MG tablet Take 1 tablet by mouth daily.     No current facility-administered medications for this visit.     Review of Systems  Constitutional: Positive for malaise/fatigue. Negative for chills, diaphoresis, fever and weight loss (no new weight).       Feels "like a baby lying in bed".  HENT: Negative.  Negative for congestion, ear pain, hearing loss, nosebleeds, sinus pain and  sore throat.   Eyes: Negative.  Negative for blurred vision, double vision and photophobia.  Respiratory: Positive for cough (chronic), hemoptysis (chronic- no change) and sputum production (phlegm). Negative for shortness of breath and wheezing.   Cardiovascular: Positive for leg swelling (feet and ankles). Negative for chest pain, palpitations and PND.  Gastrointestinal: Positive for constipation (chronic on stool softener). Negative for abdominal pain, blood in stool, diarrhea, melena, nausea and vomiting.       Poor appetite. Improvement in eating with his wife preparing meals.  Genitourinary: Negative.  Negative for dysuria, flank pain, frequency, hematuria and urgency.       Difficult voiding now on Flomax.  Musculoskeletal: Positive for falls. Negative for back pain, joint  pain, myalgias and neck pain.       Right lower rib pain with movement.  Skin: Negative.  Negative for itching and rash.  Neurological: Positive for weakness (generalized). Negative for dizziness, tingling, sensory change, speech change, focal weakness, seizures and headaches.  Endo/Heme/Allergies: Negative.  Does not bruise/bleed easily.  Psychiatric/Behavioral: Positive for memory loss. Negative for depression. The patient has insomnia. The patient is not nervous/anxious.   All other systems reviewed and are negative.  Performance status (ECOG): 2-3  Vitals Blood pressure (!) 95/57, pulse 71, temperature (!) 96.3 F (35.7 C), temperature source Tympanic, resp. rate 18, height _0  (1.676 m), SpO2 98 %.   Physical Exam  Constitutional: He is oriented to person, place, and time. No distress.  Fatigued appearing gentleman sitting comfortably in a wheelchair in no acute distress.  HENT:  Head: Normocephalic and atraumatic.  Mouth/Throat: Oropharynx is clear and moist. No oropharyngeal exudate.  Gray hair and [employer].  Dentures.  [place] 2 liters/min.  Mask  Eyes: Pupils are equal, round, and reactive to light.  Conjunctivae and EOM are normal. No scleral icterus.  Glasses.  Blue eyes.  Neck: Normal range of motion. Neck supple. No JVD present.  Cardiovascular: Normal rate and normal heart sounds. Exam reveals no gallop.  No murmur heard. Hear rate fluctuates with movement and talking.  Pulmonary/Chest: Effort normal and breath sounds normal. No respiratory distress. He has no wheezes. He has no rales.  Decreased breath sounds right 1/4 way up.  Abdominal: Bowel sounds are normal. He exhibits no distension and no mass. There is no abdominal tenderness. There is no rebound and no guarding.  Musculoskeletal: Normal range of motion.        General: Edema (bilateral ankle and foot) present. No tenderness.     Right ankle: He exhibits swelling.     Left ankle: He exhibits swelling.     Comments: 3+ edema  Lymphadenopathy:    He has no cervical adenopathy.    He has no axillary adenopathy.       Right: No supraclavicular adenopathy present.       Left: No supraclavicular adenopathy present.  Neurological: He is alert and oriented to person, place, and time.  Skin: Skin is warm, dry and intact. No bruising and no rash noted. He is not diaphoretic. No erythema. No pallor.  Ruddy forearm s/p old IV attempts.  No evidence of infection.  Psychiatric: He has a normal mood and affect. His behavior is normal. Judgment and thought content normal.  Nursing note and vitals reviewed.   Appointment on 11/20/2018  Component Date Value Ref Range Status  . TSH 11/20/2018 2.685  0.350 - 4.500 uIU/mL Final   Comment: Performed by a 3rd Generation assay with a functional sensitivity of <=0.01 uIU/mL. Performed at [facility], [address] [place], [place] [address]   . Sodium 11/20/2018 131* 135 - 145 mmol/L Final  . Potassium 11/20/2018 3.5  3.5 - 5.1 mmol/L Final  . Chloride 11/20/2018 94* 98 - 111 mmol/L Final  . CO2 11/20/2018 28  22 - 32 mmol/L Final  . Glucose, Bld 11/20/2018 243* 70 - 99  mg/dL Final  . BUN 11/20/2018 13  8 - 23 mg/dL Final  . Creatinine, Ser 11/20/2018 0.86  0.61 - 1.24 mg/dL Final  . Calcium 11/20/2018 8.5* 8.9 - 10.3 mg/dL Final  . Total Protein 11/20/2018 5.9* 6.5 - 8.1 g/dL Final  . Albumin 11/20/2018 2.7* 3.5 - 5.0 g/dL Final  .  AST 11/20/2018 27  15 - 41 U/L Final  . ALT 11/20/2018 30  0 - 44 U/L Final  . Alkaline Phosphatase 11/20/2018 56  38 - 126 U/L Final  . Total Bilirubin 11/20/2018 0.5  0.3 - 1.2 mg/dL Final  . GFR calc non Af Amer 11/20/2018 >60  >60 mL/min Final  . GFR calc Af Amer 11/20/2018 >60  >60 mL/min Final  . Anion gap 11/20/2018 9  5 - 15 Final   Performed at [facility], [address], [place], [place] [address]  . WBC 11/20/2018 4.2  4.0 - 10.5 K/uL Final  . RBC 11/20/2018 3.74* 4.22 - 5.81 MIL/uL Final  . Hemoglobin 11/20/2018 11.5* 13.0 - 17.0 g/dL Final  . HCT 11/20/2018 34.4* 39.0 - 52.0 % Final  . MCV 11/20/2018 92.0  80.0 - 100.0 fL Final  . MCH 11/20/2018 30.7  26.0 - 34.0 pg Final  . MCHC 11/20/2018 33.4  30.0 - 36.0 g/dL Final  . RDW 11/20/2018 13.1  11.5 - 15.5 % Final  . Platelets 11/20/2018 214  150 - 400 K/uL Final  . nRBC 11/20/2018 0.0  0.0 - 0.2 % Final  . Neutrophils Relative % 11/20/2018 68  % Final  . Neutro Abs 11/20/2018 2.9  1.7 - 7.7 K/uL Final  . Lymphocytes Relative 11/20/2018 17  % Final  . Lymphs Abs 11/20/2018 0.7  0.7 - 4.0 K/uL Final  . Monocytes Relative 11/20/2018 13  % Final  . Monocytes Absolute 11/20/2018 0.5  0.1 - 1.0 K/uL Final  . Eosinophils Relative 11/20/2018 1  % Final  . Eosinophils Absolute 11/20/2018 0.0  0.0 - 0.5 K/uL Final  . Basophils Relative 11/20/2018 0  % Final  . Basophils Absolute 11/20/2018 0.0  0.0 - 0.1 K/uL Final  . Immature Granulocytes 11/20/2018 1  % Final  . Abs Immature Granulocytes 11/20/2018 0.03  0.00 - 0.07 K/uL Final   Performed at [facility], [address], [place], [place] [address]  Admission on 11/19/2018, Discharged  on 11/19/2018  Component Date Value Ref Range Status  . WBC 11/19/2018 4.3  4.0 - 10.5 K/uL Final  . RBC 11/19/2018 4.04* 4.22 - 5.81 MIL/uL Final  . Hemoglobin 11/19/2018 12.3* 13.0 - 17.0 g/dL Final  . HCT 11/19/2018 37.5* 39.0 - 52.0 % Final  . MCV 11/19/2018 92.8  80.0 - 100.0 fL Final  . MCH 11/19/2018 30.4  26.0 - 34.0 pg Final  . MCHC 11/19/2018 32.8  30.0 - 36.0 g/dL Final  . RDW 11/19/2018 12.8  11.5 - 15.5 % Final  . Platelets 11/19/2018 195  150 - 400 K/uL Final  . nRBC 11/19/2018 0.0  0.0 - 0.2 % Final  . Neutrophils Relative % 11/19/2018 62  % Final  . Neutro Abs 11/19/2018 2.7  1.7 - 7.7 K/uL Final  . Lymphocytes Relative 11/19/2018 24  % Final  . Lymphs Abs 11/19/2018 1.1  0.7 - 4.0 K/uL Final  . Monocytes Relative 11/19/2018 12  % Final  . Monocytes Absolute 11/19/2018 0.5  0.1 - 1.0 K/uL Final  . Eosinophils Relative 11/19/2018 1  % Final  . Eosinophils Absolute 11/19/2018 0.1  0.0 - 0.5 K/uL Final  . Basophils Relative 11/19/2018 0  % Final  . Basophils Absolute 11/19/2018 0.0  0.0 - 0.1 K/uL Final  . Immature Granulocytes 11/19/2018 1  % Final  . Abs Immature Granulocytes 11/19/2018 0.03  0.00 - 0.07 K/uL Final   Performed at [facility], [address] [place], [place] [address]  . Sodium 11/19/2018 137  135 - 145 mmol/L Final  . Potassium 11/19/2018 3.3* 3.5 - 5.1 mmol/L Final  . Chloride 11/19/2018 96* 98 - 111 mmol/L Final  . CO2 11/19/2018 29  22 - 32 mmol/L Final  . Glucose, Bld 11/19/2018 121* 70 - 99 mg/dL Final  . BUN 11/19/2018 11  8 - 23 mg/dL Final  . Creatinine, Ser 11/19/2018 0.69  0.61 - 1.24 mg/dL Final  . Calcium 11/19/2018 9.0  8.9 - 10.3 mg/dL Final  . GFR calc non Af Amer 11/19/2018 >60  >60 mL/min Final  . GFR calc Af Amer 11/19/2018 >60  >60 mL/min Final  . Anion gap 11/19/2018 12  5 - 15 Final   Performed at [facility], [address], [place], [place] [address]  . B Natriuretic Peptide 11/19/2018 90.0  0.0 - 100.0  pg/mL Final   Performed at [facility], [address] [place], [place] [address]  . Color, Urine 11/19/2018 COLORLESS* YELLOW Final  . APPearance 11/19/2018 CLEAR* CLEAR Final  . Specific Gravity, Urine 11/19/2018 1.003* 1.005 - 1.030 Final  . pH 11/19/2018 7.0  5.0 - 8.0 Final  . Glucose, UA 11/19/2018 NEGATIVE  NEGATIVE mg/dL Final  . Hgb urine dipstick 11/19/2018 NEGATIVE  NEGATIVE Final  . Bilirubin Urine 11/19/2018 NEGATIVE  NEGATIVE Final  . Ketones, ur 11/19/2018 NEGATIVE  NEGATIVE mg/dL Final  . Protein, ur 11/19/2018 NEGATIVE  NEGATIVE mg/dL Final  . Nitrite 11/19/2018 NEGATIVE  NEGATIVE Final  . Leukocytes,Ua 11/19/2018 NEGATIVE  NEGATIVE Final  . WBC, UA 11/19/2018 NONE SEEN  0 - 5 WBC/hpf Final  . Bacteria, UA 11/19/2018 NONE SEEN  NONE SEEN Final  . Squamous Epithelial / LPF 11/19/2018 NONE SEEN  0 - 5 Final   Performed at [facility], [address] [place], [place] [address]  . Specimen Description 11/19/2018    Final                   Value:URINE, RANDOM Performed at [facility], [address] [place], [place] [address]   . Special Requests 11/19/2018    Final                   Value:NONE Performed at [facility], [address] [place], [place] [address]   . Culture 11/19/2018    Final                   Value:NO GROWTH Performed at [facility], [place] [address] [place], [place] [address]   . Report Status 11/19/2018 PENDING   Incomplete    Assessment:  DUANTE AROCHO is a 85 y.o. male  with metastaticadenocarcinoma of thelungs/p right sided thoracentesis on 10/06/2018.Cytologyconfirmed non-small cell carcinoma, favor adenocarcinoma of the lung.He has a 25-30 pack year smoking history.He has clinical stageT4NxM1b.  PDL1 is 80%.  Chest CT angiogramon 10/05/2018 revealed a 8.4 x 6.9 cm right lower lobe mass with numerous pleural nodules and a large right pleural effusion. There was bony  destruction of the right lateral 7th and 8th ribs. There was no pulmonary embolism.  Head MRIon 10/07/2018 revealed no metastatic disease or acute intracranial abnormality.There was distal left vertebral artery with poor flow or occlusion, most likely due to atherosclerosis.  PET scanon 10/18/2018 revealed a large hypermetabolic RIGHT lower lobe mass consistent with bronchogenic carcinoma. There was multifocal pleural metastasis within the RIGHT  hemithorax. There was one pleural metastatic lesion invading adjacent RIGHT seventh rib, and moderate RIGHT effusion. There was probable RIGHT hilar metastatic adenopathy, and no mediastinal or supraclavicular metastatic adenopathy.   He is s/pthoracentesisx 2 (10/06/2018 and10/05/2018).CT biopsyof the right lower lobelung masson 10/31/2018 was performed for mutational studies.  He received the influenza vaccineon 10/13/2018.  Symptomatically, he remains fatigued.  Exam is stable.  Blood pressure remains low.  Plan: 1.  Labs today: CBC with diff, BMP +/-IVF 2.Stage IV adenocarcinoma of the lung Patient is day 18 s/p cycle #1 Alimta and pembrolizumab.             He remains fatigued s/p cycle #1.  Review PDL1 testing of 80%.  Discuss switch to pembrolizumab alone.  Follow-up on full [facility] One testing.  Discuss plan for port-a-cath placement.  Patient agreeable to prot placement secondary to plan for ongoing pembrolizumab IV.   3. Weight loss No new weight.  Orthostatics +/- IVF today.   BP 87/56 P 81 sitting and BP 72/26 P 71 standing.   Patient received 500 cc NS.  Encourage caloric intake and fluids. 4. Hypotension             Patient on amlodipine, metoprolol, and triamterene-HCTZ (Maxzide).  Contact Dr Kary Kos regarding anti-hypertensive medication adjustment-done. 5.   Mild thrombocytopenia and leukopenia, resolved             Counts have recovered s/p cycle #1  chemotherapy.  Platelet count 76,000.  WBC 2900 ([place] 1600) on 11/14/2018.             Platelet count 214,000.  WBC 4200 ([place] 2900) today.             Continue to monitor. 6.   Bone metastasis Patienthas right lower rib metastasis seen on chest CT. Currently holding Xgeva. 7 Cancer-related pain Pain is well controlled with Percocet 5-325 1 tablet every4hours prn pain.  Continue to monitor. 8.   Schedule port-a-cath placement. 9.   RTC on 11/24/2018 for MD assessment, labs (CBC with diff, CMP, Mg, TSH), and cycle #2 pembrolizumab.  I discussed the assessment and treatment plan with the patient.  The patient was provided an opportunity to ask questions and all were answered.  The patient agreed with the plan and demonstrated an understanding of the instructions.  The patient was advised to call back if the symptoms worsen or if the condition fails to improve as anticipated.   Lequita Asal, MD, PhD    11/20/2018, 4:18 PM  I, Samul Dada, am acting as a scribe for Lequita Asal, MD.  I, [place] Mike Gip, MD, have reviewed the above documentation for accuracy and completeness, and I agree with the above.

## 2018-11-21 ENCOUNTER — Encounter: Payer: Self-pay | Admitting: Hematology and Oncology

## 2018-11-21 ENCOUNTER — Telehealth (INDEPENDENT_AMBULATORY_CARE_PROVIDER_SITE_OTHER): Payer: Self-pay

## 2018-11-21 ENCOUNTER — Encounter (INDEPENDENT_AMBULATORY_CARE_PROVIDER_SITE_OTHER): Payer: Self-pay

## 2018-11-21 ENCOUNTER — Telehealth: Payer: Self-pay

## 2018-11-21 LAB — T4: T4, Total: 7.5 ug/dL (ref 4.5–12.0)

## 2018-11-21 NOTE — Telephone Encounter (Signed)
Spoke with the front office staff with Dr Chrystine Oiler to inform them that Dr Mike Gip never got a call. They had the wrong number when i asked what was the number they had. I was able to speak with Ms Warf to inform her if she had a b/p machine could she at least keep a check of his blood pressure TID. Ms Riedl was understanding and agreeable.. I have informed her I will reach back out to her to keep a check on his blood pressure.

## 2018-11-21 NOTE — Telephone Encounter (Signed)
Spoke with the patient's wife and he is now scheduled with Dr. Lucky Cowboy for port placement on 11/27/2018 with a 8:00 am arrival time to the [facility]. Patient will do his Covid test on 11/23/2018 between 12:30-2:30 pm at the [place]. Pre-prodecure instructions were discussed and will be mailed to the patient.

## 2018-11-22 NOTE — Progress Notes (Signed)
Confirmed Name, DOB, and Address. Assessment completed with assistance from wife. Reports he is scheduled to see PCP regarding hypotension episodes tomorrow, 11/23/2018. Port placement to be completed on Monday, 11/27/2018. 3+ Pitting Edema BLE x 1 week. PCP is aware of edema.

## 2018-11-23 ENCOUNTER — Other Ambulatory Visit: Payer: Self-pay

## 2018-11-23 ENCOUNTER — Other Ambulatory Visit
Admission: RE | Admit: 2018-11-23 | Discharge: 2018-11-23 | Disposition: A | Discharge status: Home / Self Care | Payer: Medicare HMO | Source: Ambulatory Visit | Attending: Vascular Surgery | Admitting: Vascular Surgery

## 2018-11-23 DIAGNOSIS — Z01812 Encounter for preprocedural laboratory examination: Secondary | ICD-10-CM | POA: Diagnosis present

## 2018-11-23 DIAGNOSIS — Z20828 Contact with and (suspected) exposure to other viral communicable diseases: Secondary | ICD-10-CM | POA: Insufficient documentation

## 2018-11-23 LAB — SARS CORONAVIRUS 2 (TAT 6-24 HRS): SARS Coronavirus 2: NEGATIVE

## 2018-11-24 ENCOUNTER — Encounter: Payer: Self-pay | Admitting: Hematology and Oncology

## 2018-11-24 ENCOUNTER — Other Ambulatory Visit (INDEPENDENT_AMBULATORY_CARE_PROVIDER_SITE_OTHER): Payer: Self-pay | Admitting: Nurse Practitioner

## 2018-11-24 ENCOUNTER — Inpatient Hospital Stay (HOSPITAL_BASED_OUTPATIENT_CLINIC_OR_DEPARTMENT_OTHER): Payer: Medicare HMO | Admitting: Hematology and Oncology

## 2018-11-24 ENCOUNTER — Inpatient Hospital Stay: Payer: Medicare HMO

## 2018-11-24 VITALS — BP 102/62 | HR 58 | Temp 97.7°F | Resp 18

## 2018-11-24 DIAGNOSIS — C7951 Secondary malignant neoplasm of bone: Secondary | ICD-10-CM | POA: Diagnosis not present

## 2018-11-24 DIAGNOSIS — G893 Neoplasm related pain (acute) (chronic): Secondary | ICD-10-CM

## 2018-11-24 DIAGNOSIS — C3431 Malignant neoplasm of lower lobe, right bronchus or lung: Secondary | ICD-10-CM

## 2018-11-24 DIAGNOSIS — Z5112 Encounter for antineoplastic immunotherapy: Secondary | ICD-10-CM | POA: Diagnosis not present

## 2018-11-24 DIAGNOSIS — Z7189 Other specified counseling: Secondary | ICD-10-CM

## 2018-11-24 LAB — COMPREHENSIVE METABOLIC PANEL
ALT: 25 U/L (ref 0–44)
AST: 23 U/L (ref 15–41)
Albumin: 2.9 g/dL — ABNORMAL LOW (ref 3.5–5.0)
Alkaline Phosphatase: 59 U/L (ref 38–126)
Anion gap: 8 (ref 5–15)
BUN: 12 mg/dL (ref 8–23)
CO2: 27 mmol/L (ref 22–32)
Calcium: 8.7 mg/dL — ABNORMAL LOW (ref 8.9–10.3)
Chloride: 98 mmol/L (ref 98–111)
Creatinine, Ser: 0.79 mg/dL (ref 0.61–1.24)
GFR calc Af Amer: >60 mL/min (ref >60)
GFR calc non Af Amer: >60 mL/min (ref >60)
Glucose, Bld: 166 mg/dL — ABNORMAL HIGH (ref 70–99)
Potassium: 3.5 mmol/L (ref 3.5–5.1)
Sodium: 133 mmol/L — ABNORMAL LOW (ref 135–145)
Total Bilirubin: 0.5 mg/dL (ref 0.3–1.2)
Total Protein: 6.3 g/dL — ABNORMAL LOW (ref 6.5–8.1)

## 2018-11-24 LAB — CBC WITH DIFFERENTIAL/PLATELET
Abs Immature Granulocytes: 0.05 10*3/uL (ref 0.00–0.07)
Basophils Absolute: 0 10*3/uL (ref 0.0–0.1)
Basophils Relative: 0 %
Eosinophils Absolute: 0 10*3/uL (ref 0.0–0.5)
Eosinophils Relative: 1 %
HCT: 34.4 % — ABNORMAL LOW (ref 39.0–52.0)
Hemoglobin: 11.5 g/dL — ABNORMAL LOW (ref 13.0–17.0)
Immature Granulocytes: 1 %
Lymphocytes Relative: 23 %
Lymphs Abs: 1 10*3/uL (ref 0.7–4.0)
MCH: 31.1 pg (ref 26.0–34.0)
MCHC: 33.4 g/dL (ref 30.0–36.0)
MCV: 93 fL (ref 80.0–100.0)
Monocytes Absolute: 0.7 10*3/uL (ref 0.1–1.0)
Monocytes Relative: 17 %
Neutro Abs: 2.5 10*3/uL (ref 1.7–7.7)
Neutrophils Relative %: 58 %
Platelets: 246 10*3/uL (ref 150–400)
RBC: 3.7 MIL/uL — ABNORMAL LOW (ref 4.22–5.81)
RDW: 13.5 % (ref 11.5–15.5)
WBC: 4.4 10*3/uL (ref 4.0–10.5)
nRBC: 0 % (ref 0.0–0.2)

## 2018-11-24 LAB — MAGNESIUM: Magnesium: 2 mg/dL (ref 1.7–2.4)

## 2018-11-24 LAB — TSH: TSH: 1.84 u[IU]/mL (ref 0.350–4.500)

## 2018-11-24 MED ORDER — SODIUM CHLORIDE 0.9 % IV SOLN
Freq: Once | INTRAVENOUS | Status: AC
  Administered 2018-11-24: 11:00:00 via INTRAVENOUS
  Filled 2018-11-24: qty 250

## 2018-11-24 MED ORDER — SODIUM CHLORIDE 0.9 % IV SOLN
200.0000 mg | Freq: Once | INTRAVENOUS | Status: AC
  Administered 2018-11-24: 200 mg via INTRAVENOUS
  Filled 2018-11-24: qty 8

## 2018-11-24 NOTE — Progress Notes (Signed)
[facility]  [address] [place], [place] [address] Phone: [phone]  Fax: [phone]   Clinic Day:  11/24/2018  Referring physician: [name], MD  Chief Complaint: Andres Hebert is a 85 y.o. male with  stage IV adenocarcinoma of therightlung who is seen for assessment prior to cycle #2 pembrolizumab.  HPI: The patient was last seen in the medical oncology clinic on 11/20/2018. At that time, he was fatigued.  His blood pressure was low.  He was on several anti-hypertensive medications.  He received IVF.  We discussed follow-up with his PCP.   During the interim, his blood pressure has improved.  He is taking half of the amlodipine. His wife says he has had no fevers. He doesn't eat breakfast often. He just drinks a cup a coffee and will drink 2 Ensures a day.  Symptomatically, his breathing is well today. He is on 2 liters/min of oxygen. He continues to cough up phlegm. His wife says that his hemoptysis is better.  He continues to have lower extremity edema.  He denies any nausea, vomiting or diarrhea.   Past Medical History:  Diagnosis Date  . BPV (benign positional vertigo)   . Chronic constipation   . Diabetes mellitus without complication (Andres Hebert)   . Hyperlipidemia   . Hypertension     History reviewed. No pertinent surgical history.  Family History  Problem Relation Age of Onset  . Stroke Mother   . Prostate cancer Father     Social History:  reports that he quit smoking about 44 years ago. His smoking use included cigarettes. He has a 25.00 pack-year smoking history. He has never used smokeless tobacco. He reports previous alcohol use. He reports that he does not use drugs. Hehas a 25-30 pack-year smoking history.He stoppedsmoking about 45 years ago.He denies any exposure to asbestos, radiation or toxins.He worked for [employer] and [employer] lives in [place].His wife's name isSheila. The patient is accompanied by his wife  in person today.  Allergies:  Allergies  Allergen Reactions  . Accupril [Quinapril Hcl] Other (See Comments)    Reaction:dizziness per MR    Current Medications: Current Outpatient Medications  Medication Sig Dispense Refill  . acetaminophen (TYLENOL) 325 MG tablet Take 2 tablets (650 mg total) by mouth every 6 (six) hours as needed for mild pain (or Fever >/= 101).    Marland Kitchen amLODipine (NORVASC) 5 MG tablet Take 2.5 mg by mouth daily.     Marland Kitchen aspirin 81 MG chewable tablet Chew 81 mg by mouth daily.    . citalopram (CELEXA) 10 MG tablet Take 1 tablet by mouth daily.    . feeding supplement, ENSURE ENLIVE, (ENSURE ENLIVE) LIQD Take 237 mLs by mouth 3 (three) times daily between meals. 263 mL 0  . folic acid (FOLVITE) 1 MG tablet Take 1 tablet (1 mg total) by mouth daily. Start 5-7 days before Alimta chemotherapy. Continue until 21 days after Alimta completed. 100 tablet 3  . metoprolol tartrate (LOPRESSOR) 25 MG tablet Take 0.5 tablets (12.5 mg total) by mouth 2 (two) times daily. 60 tablet 0  . oxyCODONE-acetaminophen (PERCOCET/ROXICET) 5-325 MG tablet Take 1 tablet by mouth every 4 (four) hours as needed for moderate pain. 30 tablet 0  . polyethylene glycol (MIRALAX / GLYCOLAX) 17 g packet Take 17 g by mouth 2 (two) times daily. 14 each 0  . potassium chloride (K-DUR,KLOR-CON) 10 MEQ tablet Take 10 mEq by mouth 2 (two) times daily.    Marland Kitchen  pravastatin (PRAVACHOL) 40 MG tablet Take 40 mg by mouth daily.    . tamsulosin (FLOMAX) 0.4 MG CAPS capsule Take 1 capsule (0.4 mg total) by mouth daily. 30 capsule 0  . traZODone (DESYREL) 50 MG tablet Take 50 mg by mouth at bedtime.     . triamterene-hydrochlorothiazide (MAXZIDE-25) 37.5-25 MG tablet Take 1 tablet by mouth daily.    Marland Kitchen dexamethasone (DECADRON) 4 MG tablet Take 1 tab two times a day the day before Alimta chemo, then take 2 tabs once a day for 3 days starting the day after chemo. (Patient not taking: Reported on 11/14/2018) 30 tablet 1  .  ondansetron (ZOFRAN) 8 MG tablet Take 1 tablet (8 mg total) by mouth 2 (two) times daily as needed (Nausea or vomiting). Start if needed on the third day after chemotherapy. (Patient not taking: Reported on 11/03/2018) 30 tablet 1  . senna (SENOKOT) 8.6 MG TABS tablet Take 1 tablet (8.6 mg total) by mouth daily as needed for mild constipation. (Patient not taking: Reported on 11/14/2018) 30 tablet 0   No current facility-administered medications for this visit.     Review of Systems  Constitutional: Positive for malaise/fatigue. Negative for chills, diaphoresis, fever and weight loss (no new weight).       Feels about the same or a little better.  HENT: Negative.  Negative for congestion, ear pain, hearing loss, nosebleeds, sinus pain and sore throat.   Eyes: Negative.  Negative for blurred vision, double vision and photophobia.  Respiratory: Positive for cough (chronic), hemoptysis (chronic- improving) and sputum production (phlegm). Negative for shortness of breath and wheezing.   Cardiovascular: Positive for leg swelling (feet and ankles). Negative for chest pain, palpitations and PND.  Gastrointestinal: Positive for constipation (chronic on stool softener). Negative for abdominal pain, blood in stool, diarrhea (1 episode x 1 day ago), melena, nausea and vomiting.       Poor appetite.  Drinking Ensure.  Genitourinary: Negative.  Negative for dysuria, flank pain, frequency, hematuria and urgency.  Musculoskeletal: Negative for back pain, falls, joint pain, myalgias and neck pain.       Right lower rib pain with movement (improved).  Skin: Negative.  Negative for itching and rash.  Neurological: Positive for weakness (generalized). Negative for dizziness, tingling, sensory change, speech change, focal weakness, seizures and headaches.  Endo/Heme/Allergies: Negative.  Does not bruise/bleed easily.  Psychiatric/Behavioral: Positive for memory loss. Negative for depression. The patient has  insomnia. The patient is not nervous/anxious.   All other systems reviewed and are negative.  Performance status (ECOG): 2-3  Vitals Blood pressure 102/62, pulse (!) 58, temperature 97.7 F (36.5 C), temperature source Tympanic, resp. rate 18, SpO2 100 %.   Physical Exam  Constitutional: He is oriented to person, place, and time. No distress.  Chronically fatigued appearing gentleman sitting comfortably in a wheelchair in no acute distress.  HENT:  Head: Normocephalic and atraumatic.  Mouth/Throat: Oropharynx is clear and moist. No oropharyngeal exudate.  Cap and mask.  Gray hair and beard.  Dentures.  [place] 2 liters/min.  Eyes: Pupils are equal, round, and reactive to light. Conjunctivae and EOM are normal. No scleral icterus.  Glasses.  Blue eyes.  Neck: Normal range of motion. Neck supple. No JVD present.  Cardiovascular: Normal rate and normal heart sounds. Exam reveals no gallop.  No murmur heard. Pulmonary/Chest: No respiratory distress. He has no wheezes. He has no rales.  Decreased breath sounds right base.  Abdominal: Soft. Bowel sounds are normal.  He exhibits no distension and no mass. There is no abdominal tenderness. There is no rebound and no guarding.  Musculoskeletal: Normal range of motion.        General: Edema (3+ bilateral ankle and foot) present. No tenderness.  Lymphadenopathy:       Head (right side): No preauricular, no posterior auricular and no occipital adenopathy present.       Head (left side): No preauricular, no posterior auricular and no occipital adenopathy present.    He has no cervical adenopathy.    He has no axillary adenopathy.       Right: No inguinal and no supraclavicular adenopathy present.       Left: No inguinal and no supraclavicular adenopathy present.  Neurological: He is alert and oriented to person, place, and time.  Skin: Skin is warm, dry and intact. No bruising and no rash noted. He is not diaphoretic. No erythema. No pallor.   Psychiatric: He has a normal mood and affect. His behavior is normal. Judgment and thought content normal.  Nursing note and vitals reviewed.   Appointment on 11/24/2018  Component Date Value Ref Range Status  . WBC 11/24/2018 4.4  4.0 - 10.5 K/uL Final  . RBC 11/24/2018 3.70* 4.22 - 5.81 MIL/uL Final  . Hemoglobin 11/24/2018 11.5* 13.0 - 17.0 g/dL Final  . HCT 11/24/2018 34.4* 39.0 - 52.0 % Final  . MCV 11/24/2018 93.0  80.0 - 100.0 fL Final  . MCH 11/24/2018 31.1  26.0 - 34.0 pg Final  . MCHC 11/24/2018 33.4  30.0 - 36.0 g/dL Final  . RDW 11/24/2018 13.5  11.5 - 15.5 % Final  . Platelets 11/24/2018 246  150 - 400 K/uL Final  . nRBC 11/24/2018 0.0  0.0 - 0.2 % Final  . Neutrophils Relative % 11/24/2018 58  % Final  . Neutro Abs 11/24/2018 2.5  1.7 - 7.7 K/uL Final  . Lymphocytes Relative 11/24/2018 23  % Final  . Lymphs Abs 11/24/2018 1.0  0.7 - 4.0 K/uL Final  . Monocytes Relative 11/24/2018 17  % Final  . Monocytes Absolute 11/24/2018 0.7  0.1 - 1.0 K/uL Final  . Eosinophils Relative 11/24/2018 1  % Final  . Eosinophils Absolute 11/24/2018 0.0  0.0 - 0.5 K/uL Final  . Basophils Relative 11/24/2018 0  % Final  . Basophils Absolute 11/24/2018 0.0  0.0 - 0.1 K/uL Final  . Immature Granulocytes 11/24/2018 1  % Final  . Abs Immature Granulocytes 11/24/2018 0.05  0.00 - 0.07 K/uL Final   Performed at [facility], [address], [place], [place] [address]  Hospital Outpatient Visit on 11/23/2018  Component Date Value Ref Range Status  . SARS Coronavirus 2 11/23/2018 NEGATIVE  NEGATIVE Final   Comment: (NOTE) SARS-CoV-2 target nucleic acids are NOT DETECTED. The SARS-CoV-2 RNA is generally detectable in upper and lower respiratory specimens during the acute phase of infection. Negative results do not preclude SARS-CoV-2 infection, do not rule out co-infections with other pathogens, and should not be used as the sole basis for treatment or other patient management  decisions. Negative results must be combined with clinical observations, patient history, and epidemiological information. The expected result is Negative. Fact Sheet for Patients: SugarRoll.be Fact Sheet for Healthcare Providers: https://www.woods-mathews.com/ This test is not yet approved or cleared by the [place] FDA and  has been authorized for detection and/or diagnosis of SARS-CoV-2 by FDA under an Emergency Use Authorization (EUA). This EUA will remain  in effect (meaning  this test can be used) for the duration of the COVID-19 declaration under Section 56                          4(b)(1) of the Act, 21 U.S.C. section 360bbb-3(b)(1), unless the authorization is terminated or revoked sooner. Performed at [facility], [place] [address], [place], [place] [address]     Assessment:  Andres Hebert is a 85 y.o. male  with metastaticadenocarcinoma of thelungs/p right sided thoracentesis on 10/06/2018.Cytologyconfirmed non-small cell carcinoma, favor adenocarcinoma of the lung.He has a 25-30 pack year smoking history.He has clinical stageT4NxM1b.  PD-L1 testing was 80% on 11/09/2018.  Foundation One testing revealed number mutational burden (TMB)18 Muts/Mb, MSI-stable, ATM splice site 2458-0D>X, CDKN2A loss, CDKN2B loss, DNMT3A Y533C, FGF10 amplification, KRAS G12C, MTAP loss, and RBM10 A48f*216.  Chest CT angiogramon 10/05/2018 revealed a 8.4 x 6.9 cm right lower lobe mass with numerous pleural nodules and a large right pleural effusion. There was bony destruction of the right lateral 7th and 8th ribs. There was no pulmonary embolism.  Head MRIon 10/07/2018 revealed no metastatic disease or acute intracranial abnormality.There was distal left vertebral artery with poor flow or occlusion, most likely due to atherosclerosis.  PET scanon 10/18/2018 revealed a large hypermetabolic RIGHT lower lobe mass consistent with  bronchogenic carcinoma. There was multifocal pleural metastasis within the RIGHT hemithorax. There was one pleural metastatic lesion invading adjacent RIGHT seventh rib, and moderate RIGHT effusion. There was probable RIGHT hilar metastatic adenopathy, and no mediastinal or supraclavicular metastatic adenopathy.   He is s/pthoracentesisx 2 (10/06/2018 and10/05/2018).CT biopsyof the right lower lobelung masson 10/31/2018 was performed for mutational studies.  He is s/p cycle #1 Alimta and pembrolizumab on 11/03/2018.  He received the influenza vaccineon 10/13/2018.  Symptomatically, he is chronically fatigued.  Blood pressure has improved with decreased anti-hypertensives.  Exam reveals decreased breath sounds right base.  Plan: 1.   Labs today: CBC with diff, CMP, TSH. 2.Stage IV adenocarcinoma of the lung Patient is day 22 s/p cycle #1 Alimta and pembrolizumab.  Review Foundation One and PDL-1 status.  Review plan for pembrolizumab alone.  Cycle #2 pembrolizumab today. Discuss symptom management.  He has antiemetics and pain medications at home to use on a prn bases.  Interventions are adequate.    3. Weight loss  No new weight today.  Discuss importance of caloric intake.  Samples of Ensure provided. 4. Bone metastasis Patienthas right lower rib metastasis seen on chest CT. No othersites of metastasisonbone scan. Hold Xgeva for now. 5 Cancer-related pain Pain is well controlled. Continue Percocet 5-325 1 tablet every 4 hours prn pain (last Rx 10/24/2018). 6.   RTC in 10 days for MD assessment and labs (CBC with diff, BMP). 7.   RTC in 3 weeks for MD assessment, labs (CBC with diff, CMP, Mg, TSH), and pembrolizumab.  I discussed the assessment and treatment plan with the patient.  The patient was provided an opportunity to ask questions and all were answered.  The patient  agreed with the plan and demonstrated an understanding of the instructions.  The patient was advised to call back if the symptoms worsen or if the condition fails to improve as anticipated.   MLequita Asal MD, PhD    11/24/2018, 11:01 AM  I, MSamul Dada am acting as a scribe for MLequita Asal MD.  I, MRadissonCMike Gip MD, have reviewed the above documentation for accuracy and  completeness, and I agree with the above.

## 2018-11-24 NOTE — Patient Instructions (Signed)
Pembrolizumab injection What is this medicine? PEMBROLIZUMAB (pem broe liz ue mab) is a monoclonal antibody. It is used to treat bladder cancer, cervical cancer, endometrial cancer, esophageal cancer, head and neck cancer, hepatocellular cancer, Hodgkin lymphoma, kidney cancer, lymphoma, melanoma, Merkel cell carcinoma, lung cancer, stomach cancer, urothelial cancer, and cancers that have a certain genetic condition. This medicine may be used for other purposes; ask your health care provider or pharmacist if you have questions. COMMON BRAND NAME(S): Keytruda What should I tell my health care provider before I take this medicine? They need to know if you have any of these conditions:  diabetes  immune system problems  inflammatory bowel disease  liver disease  lung or breathing disease  lupus  received or scheduled to receive an organ transplant or a stem-cell transplant that uses donor stem cells  an unusual or allergic reaction to pembrolizumab, other medicines, foods, dyes, or preservatives  pregnant or trying to get pregnant  breast-feeding How should I use this medicine? This medicine is for infusion into a vein. It is given by a health care professional in a hospital or clinic setting. A special MedGuide will be given to you before each treatment. Be sure to read this information carefully each time. Talk to your pediatrician regarding the use of this medicine in children. While this drug may be prescribed for selected conditions, precautions do apply. Overdosage: If you think you have taken too much of this medicine contact a poison control center or emergency room at once. NOTE: This medicine is only for you. Do not share this medicine with others. What if I miss a dose? It is important not to miss your dose. Call your doctor or health care professional if you are unable to keep an appointment. What may interact with this medicine? Interactions have not been studied. Give  your health care provider a list of all the medicines, herbs, non-prescription drugs, or dietary supplements you use. Also tell them if you smoke, drink alcohol, or use illegal drugs. Some items may interact with your medicine. This list may not describe all possible interactions. Give your health care provider a list of all the medicines, herbs, non-prescription drugs, or dietary supplements you use. Also tell them if you smoke, drink alcohol, or use illegal drugs. Some items may interact with your medicine. What should I watch for while using this medicine? Your condition will be monitored carefully while you are receiving this medicine. You may need blood work done while you are taking this medicine. Do not become pregnant while taking this medicine or for 4 months after stopping it. Women should inform their doctor if they wish to become pregnant or think they might be pregnant. There is a potential for serious side effects to an unborn child. Talk to your health care professional or pharmacist for more information. Do not breast-feed an infant while taking this medicine or for 4 months after the last dose. What side effects may I notice from receiving this medicine? Side effects that you should report to your doctor or health care professional as soon as possible:  allergic reactions like skin rash, itching or hives, swelling of the face, lips, or tongue  bloody or black, tarry  breathing problems  changes in vision  chest pain  chills  confusion  constipation  cough  diarrhea  dizziness or feeling faint or lightheaded  fast or irregular heartbeat  fever  flushing  hair loss  joint pain  low blood counts - this  medicine may decrease the number of white blood cells, red blood cells and platelets. You may be at increased risk for infections and bleeding.  muscle pain  muscle weakness  persistent headache  redness, blistering, peeling or loosening of the skin,  including inside the mouth  signs and symptoms of high blood sugar such as dizziness; dry mouth; dry skin; fruity breath; nausea; stomach pain; increased hunger or thirst; increased urination  signs and symptoms of kidney injury like trouble passing urine or change in the amount of urine  signs and symptoms of liver injury like dark urine, light-colored stools, loss of appetite, nausea, right upper belly pain, yellowing of the eyes or skin  sweating  swollen lymph nodes  weight loss Side effects that usually do not require medical attention (report to your doctor or health care professional if they continue or are bothersome):  decreased appetite  muscle pain  tiredness This list may not describe all possible side effects. Call your doctor for medical advice about side effects. You may report side effects to FDA at 1-800-FDA-1088. Where should I keep my medicine? This drug is given in a hospital or clinic and will not be stored at home. NOTE: This sheet is a summary. It may not cover all possible information. If you have questions about this medicine, talk to your doctor, pharmacist, or health care provider.  2020 Elsevier/Gold Standard (2018-02-07 13:46:58)

## 2018-11-24 NOTE — Progress Notes (Deleted)
[facility]  [address] [place], [place] [address] Phone: [phone]  Fax: [phone]   Clinic Day:  11/24/2018  Referring physician: [name], MD  Chief Complaint: Andres Hebert is a 85 y.o. male with  stage IV adenocarcinoma of therightlung who is seen for assessment prior to cycle #2 pembrolizumab.  HPI: The patient was last seen in the medical oncology clinic on 11/20/2018. At that time, he was fatigued.  His blood pressure was low.  He was on several anti-hypertensive medications.  He received IVF.  We discussed follow-up with his PCP.   During the interim,   Past Medical History:  Diagnosis Date  . BPV (benign positional vertigo)   . Chronic constipation   . Diabetes mellitus without complication ([place])   . Hyperlipidemia   . Hypertension     No past surgical history on file.  Family History  Problem Relation Age of Onset  . Stroke Mother   . Prostate cancer Father     Social History:  reports that he quit smoking about 44 years ago. His smoking use included cigarettes. He has a 25.00 pack-year smoking history. He has never used smokeless tobacco. He reports previous alcohol use. He reports that he does not use drugs. Hehas a 25-30 pack-year smoking history.He stoppedsmoking about 45 years ago.He denies any exposure to asbestos, radiation or toxins.He worked for [employer] and [place] lives in [place].His wife's name isSheila. The patient is accompanied by his wife in person today.  Allergies:  Allergies  Allergen Reactions  . Accupril [Quinapril Hcl] Other (See Comments)    Reaction:dizziness per MR    Current Medications: Current Outpatient Medications  Medication Sig Dispense Refill  . acetaminophen (TYLENOL) 325 MG tablet Take 2 tablets (650 mg total) by mouth every 6 (six) hours as needed for mild pain (or Fever >/= 101).    Marland Kitchen amLODipine (NORVASC) 5 MG tablet Take 5 mg by mouth daily.    Marland Kitchen aspirin 81 MG  chewable tablet Chew 81 mg by mouth daily.    . citalopram (CELEXA) 10 MG tablet Take 1 tablet by mouth daily.    . feeding supplement, ENSURE ENLIVE, (ENSURE ENLIVE) LIQD Take 237 mLs by mouth 3 (three) times daily between meals. 749 mL 0  . folic acid (FOLVITE) 1 MG tablet Take 1 tablet (1 mg total) by mouth daily. Start 5-7 days before Alimta chemotherapy. Continue until 21 days after Alimta completed. 100 tablet 3  . metoprolol tartrate (LOPRESSOR) 25 MG tablet Take 0.5 tablets (12.5 mg total) by mouth 2 (two) times daily. 60 tablet 0  . oxyCODONE-acetaminophen (PERCOCET/ROXICET) 5-325 MG tablet Take 1 tablet by mouth every 4 (four) hours as needed for moderate pain. 30 tablet 0  . polyethylene glycol (MIRALAX / GLYCOLAX) 17 g packet Take 17 g by mouth 2 (two) times daily. 14 each 0  . potassium chloride (K-DUR,KLOR-CON) 10 MEQ tablet Take 10 mEq by mouth 2 (two) times daily.    . pravastatin (PRAVACHOL) 40 MG tablet Take 40 mg by mouth daily.    . tamsulosin (FLOMAX) 0.4 MG CAPS capsule Take 1 capsule (0.4 mg total) by mouth daily. 30 capsule 0  . traZODone (DESYREL) 50 MG tablet Take 50 mg by mouth at bedtime.     . triamterene-hydrochlorothiazide (MAXZIDE-25) 37.5-25 MG tablet Take 1 tablet by mouth daily.    Marland Kitchen dexamethasone (DECADRON) 4 MG tablet Take 1 tab two times a day the day before Alimta chemo, then  take 2 tabs once a day for 3 days starting the day after chemo. (Patient not taking: Reported on 11/14/2018) 30 tablet 1  . ondansetron (ZOFRAN) 8 MG tablet Take 1 tablet (8 mg total) by mouth 2 (two) times daily as needed (Nausea or vomiting). Start if needed on the third day after chemotherapy. (Patient not taking: Reported on 11/03/2018) 30 tablet 1  . senna (SENOKOT) 8.6 MG TABS tablet Take 1 tablet (8.6 mg total) by mouth daily as needed for mild constipation. (Patient not taking: Reported on 11/14/2018) 30 tablet 0   No current facility-administered medications for this visit.      Review of Systems  Constitutional: Positive for malaise/fatigue and weight loss (3 pounds in 11 days). Negative for chills, diaphoresis and fever.  HENT: Negative.  Negative for congestion, ear pain, hearing loss, nosebleeds, sinus pain and sore throat.   Eyes: Negative.  Negative for blurred vision, double vision and photophobia.  Respiratory: Positive for cough (chronic), hemoptysis (chronic- no change in volume or amount) and sputum production (phlegm). Negative for shortness of breath and wheezing.   Cardiovascular: Positive for leg swelling (feet and ankles). Negative for chest pain, palpitations and PND.  Gastrointestinal: Positive for constipation (chronic on stool softener). Negative for abdominal pain, blood in stool, diarrhea (1 episode x 1 day ago), melena, nausea and vomiting.       Poor appetite.  Genitourinary: Negative for dysuria, flank pain, frequency, hematuria and urgency.  Musculoskeletal: Positive for falls. Negative for back pain, joint pain, myalgias and neck pain.       Pain in tailbone with sitting; denies skin breakdown.  Right lower rib pain only with movement (improved).  Skin: Negative.  Negative for itching and rash.  Neurological: Positive for weakness (generalized). Negative for dizziness, tingling, sensory change, speech change, focal weakness, seizures and headaches.  Endo/Heme/Allergies: Negative.  Does not bruise/bleed easily.  Psychiatric/Behavioral: Positive for memory loss. Negative for depression. The patient has insomnia. The patient is not nervous/anxious.   All other systems reviewed and are negative.   Performance status (ECOG): 2-3  Vitals There were no vitals taken for this visit.   Physical Exam  Constitutional: He is oriented to person, place, and time. No distress.  Fatigued appearing gentleman sitting comfortably in a wheelchair in no acute distress.  HENT:  Head: Normocephalic and atraumatic.  Mouth/Throat: Oropharynx is clear and moist.  No oropharyngeal exudate.  Gray hair and beard.  Dentures.  [place] 2 liters/min. He is wearing a black mask  Eyes: Pupils are equal, round, and reactive to light. Conjunctivae and EOM are normal. No scleral icterus.  Glasses.  Blue eyes.  Neck: Normal range of motion. Neck supple. No JVD present.  Cardiovascular: Normal rate. Exam reveals no gallop.  No murmur heard. Hear rate fluctuates with movement and talking.  Pulmonary/Chest: Effort normal and breath sounds normal. No respiratory distress. He has no wheezes. He has no rales.  Decreased breath sounds right 1/4 way up.  Abdominal: Soft. Bowel sounds are normal. He exhibits no distension and no mass. There is no abdominal tenderness. There is no rebound and no guarding.  Musculoskeletal: Normal range of motion.        General: Edema (bilateral ankle) present. No tenderness.     Right ankle: He exhibits swelling.     Left ankle: He exhibits swelling.     Comments: 3+ edema  Lymphadenopathy:    He has no cervical adenopathy.    He has no axillary adenopathy.  Right: No supraclavicular adenopathy present.       Left: No supraclavicular adenopathy present.  Neurological: He is alert and oriented to person, place, and time.  Skin: Skin is warm, dry and intact. No bruising and no rash noted. He is not diaphoretic. No erythema. No pallor.  Psychiatric: He has a normal mood and affect. His behavior is normal. Judgment and thought content normal.  Nursing note and vitals reviewed.    Hospital Outpatient Visit on 11/23/2018  Component Date Value Ref Range Status  . SARS Coronavirus 2 11/23/2018 NEGATIVE  NEGATIVE Final   Comment: (NOTE) SARS-CoV-2 target nucleic acids are NOT DETECTED. The SARS-CoV-2 RNA is generally detectable in upper and lower respiratory specimens during the acute phase of infection. Negative results do not preclude SARS-CoV-2 infection, do not rule out co-infections with other pathogens, and should not be used as  the sole basis for treatment or other patient management decisions. Negative results must be combined with clinical observations, patient history, and epidemiological information. The expected result is Negative. Fact Sheet for Patients: SugarRoll.be Fact Sheet for Healthcare Providers: https://www.woods-mathews.com/ This test is not yet approved or cleared by the [place] FDA and  has been authorized for detection and/or diagnosis of SARS-CoV-2 by FDA under an Emergency Use Authorization (EUA). This EUA will remain  in effect (meaning this test can be used) for the duration of the COVID-19 declaration under Section 56                          4(b)(1) of the Act, 21 U.S.C. section 360bbb-3(b)(1), unless the authorization is terminated or revoked sooner. Performed at [facility], [place] [address], [place], [place] [address]     Assessment:  Andres Hebert is a 85 y.o. male  with metastaticadenocarcinoma of thelungs/p right sided thoracentesis on 10/06/2018.Cytologyconfirmed non-small cell carcinoma, favor adenocarcinoma of the lung.He has a 25-30 pack year smoking history.He has clinical [id number].  PD-L1 testing was 80% on 11/09/2018.  Foundation One testing revealed  Chest CT angiogramon 10/05/2018 revealed a 8.4 x 6.9 cm right lower lobe mass with numerous pleural nodules and a large right pleural effusion. There was bony destruction of the right lateral 7th and 8th ribs. There was no pulmonary embolism.  Head MRIon 10/07/2018 revealed no metastatic disease or acute intracranial abnormality.There was distal left vertebral artery with poor flow or occlusion, most likely due to atherosclerosis.  PET scanon 10/18/2018 revealed a large hypermetabolic RIGHT lower lobe mass consistent with bronchogenic carcinoma. There was multifocal pleural metastasis within the RIGHT hemithorax. There was one pleural metastatic  lesion invading adjacent RIGHT seventh rib, and moderate RIGHT effusion. There was probable RIGHT hilar metastatic adenopathy, and no mediastinal or supraclavicular metastatic adenopathy.   He is s/pthoracentesisx 2 (10/06/2018 and10/05/2018).CT biopsyof the right lower lobelung masson 10/31/2018 was performed for mutational studies.  He is s/p cycle #1 Alimta and pembrolizumab on 11/03/2018.  He received the influenza vaccineon 10/13/2018.  Symptomatically, ***  Plan: 1.  Labs today: CBC with diff, CMP, TSH.  2.Stage IV adenocarcinoma of the lung Patient is day 12 s/p cycle #1 Alimta and pembrolizumab.             He tolerated his chemotherapy well without nausea, vomiting or diarrhea.             He has less RUQ/RLL rib pain.             He has a  persistent small right lower lobe effusion.             Counts have dropped.             Follow-up Foundation One testing on lung biopsy to determine if other treatment options. Future treatment options based on mutational analaysis (EGFR, ALK, ROS1, BRAF, MET, RET, PDL-1). If targetable mutation- directed therapy. If no targetable mutation and PDL-1 >50% consider pembrolizumab alone. If no targetable mutation and PDL-1 <50% or unknown continue Alimta + pembrolizumab +/- carboplatin. Discuss symptom management.  He has antiemetics and pain medications at home to use on a prn bases.  Interventions are adequate.    3. Weight loss Patient has lost 3 pounds in past 11 days.             Oral intake is poor.             He is dehydrated.. 4. Dehydration and heart rate variability             Initial monitoring in clinic variable heart rate low 30s to 150s.             Initial vitals sitting and standing:  80/50 pulse 79 sitting and 47/28 pulse 67 standing.                          Patient received 1 liter NS.             Repeat vitals sitting and standing:  104/71 pulse 118 sitting and 80/51 pulse 48 standing                         Patient received 2nd liter of NS.  Patient voided.             Repeat vitals sitting and standing: 102/67 pulse 121 sitting and 88/59 pulse 88 standing.             Concern for atrial fibrillation.               Spoke with Dr End who recommended ER evaluation, EKG and cardiac monitoring. 5.   Mild thrombocytopenia and leukopenia             Platelet count 76,000.  WBC 2900 (ANC 1600).             Expected nadir post chemotherapy.             No excess bruising.  Hemoptysis unchanged.             No fever.  Review neutropenic precautions.             Check counts before weekend to document recovery. 6.   Bone metastasis Patienthas right lower rib metastasis seen on chest CT. No othersites of metastasisonbone scan. Continue to hold Xgeva for now. 7 Cancer-related pain Pain is well controlled with minimal use of Percocet. ContinuePercocet 5-325 1 tablet every4hours prn pain(last Rx 10/24/2018). 8. Orthostatics +/- IVF. 9. Dr Kary Kos on phone re: anti-HTN meds. 10. Port-a-cath placement. 11. Please obtain rest of Foundation One testing. RTC on 10/30 for MD assess, labs (CBC with diff, CMP, Mg, TSH), and cycle #2 pembrolizumab.  I discussed the assessment and treatment plan with the patient.  The patient was provided an opportunity to ask questions and all were answered.  The patient agreed with the plan and demonstrated an understanding of the instructions.  The patient was advised  to call back if the symptoms worsen or if the condition fails to improve as anticipated.  I provided *** minutes (10:35 AM - 10:50 AM) of face-to-face time during this this encounter and > 50% was spent counseling as documented under my assessment and plan.    Lequita Asal, MD,  PhD    11/24/2018, 4:24 AM   I, Samul Dada, am acting as a scribe for Lequita Asal, MD.  {Add scribe attestation statement}

## 2018-11-25 LAB — URINE CULTURE: Culture: NO GROWTH

## 2018-11-25 LAB — T4: T4, Total: 7.3 ug/dL (ref 4.5–12.0)

## 2018-11-26 DIAGNOSIS — E860 Dehydration: Secondary | ICD-10-CM | POA: Insufficient documentation

## 2018-11-27 ENCOUNTER — Encounter
Admission: RE | Disposition: A | Discharge status: Home / Self Care | Payer: Self-pay | Source: Home / Self Care | Attending: Vascular Surgery

## 2018-11-27 ENCOUNTER — Other Ambulatory Visit: Payer: Self-pay

## 2018-11-27 ENCOUNTER — Other Ambulatory Visit: Payer: Medicare HMO

## 2018-11-27 ENCOUNTER — Ambulatory Visit: Payer: Medicare HMO

## 2018-11-27 ENCOUNTER — Ambulatory Visit
Admission: RE | Admit: 2018-11-27 | Discharge: 2018-11-27 | Disposition: A | Discharge status: Home / Self Care | Payer: Medicare HMO | Attending: Vascular Surgery | Admitting: Vascular Surgery

## 2018-11-27 ENCOUNTER — Ambulatory Visit: Payer: Medicare HMO | Admitting: Hematology and Oncology

## 2018-11-27 DIAGNOSIS — C3490 Malignant neoplasm of unspecified part of unspecified bronchus or lung: Secondary | ICD-10-CM

## 2018-11-27 DIAGNOSIS — E119 Type 2 diabetes mellitus without complications: Secondary | ICD-10-CM | POA: Insufficient documentation

## 2018-11-27 DIAGNOSIS — I10 Essential (primary) hypertension: Secondary | ICD-10-CM | POA: Insufficient documentation

## 2018-11-27 DIAGNOSIS — E785 Hyperlipidemia, unspecified: Secondary | ICD-10-CM | POA: Insufficient documentation

## 2018-11-27 DIAGNOSIS — C7951 Secondary malignant neoplasm of bone: Secondary | ICD-10-CM | POA: Diagnosis not present

## 2018-11-27 DIAGNOSIS — C3491 Malignant neoplasm of unspecified part of right bronchus or lung: Secondary | ICD-10-CM | POA: Insufficient documentation

## 2018-11-27 DIAGNOSIS — Z79899 Other long term (current) drug therapy: Secondary | ICD-10-CM | POA: Diagnosis not present

## 2018-11-27 DIAGNOSIS — Z7982 Long term (current) use of aspirin: Secondary | ICD-10-CM | POA: Diagnosis not present

## 2018-11-27 HISTORY — PX: PORTA CATH INSERTION: CATH118285

## 2018-11-27 LAB — GLUCOSE, CAPILLARY: Glucose-Capillary: 133 mg/dL — ABNORMAL HIGH (ref 70–99)

## 2018-11-27 SURGERY — PORTA CATH INSERTION
Anesthesia: Moderate Sedation

## 2018-11-27 MED ORDER — SODIUM CHLORIDE 0.9 % IV SOLN
Freq: Once | INTRAVENOUS | Status: AC
  Administered 2018-11-27: 10:00:00
  Filled 2018-11-27: qty 80

## 2018-11-27 MED ORDER — MIDAZOLAM HCL 2 MG/2ML IJ SOLN
INTRAMUSCULAR | Status: DC | PRN
  Administered 2018-11-27: 2 mg via INTRAVENOUS

## 2018-11-27 MED ORDER — FENTANYL CITRATE (PF) 100 MCG/2ML IJ SOLN
INTRAMUSCULAR | Status: DC | PRN
  Administered 2018-11-27: 50 ug via INTRAVENOUS

## 2018-11-27 MED ORDER — FAMOTIDINE 20 MG PO TABS: 40.0000 mg | ORAL_TABLET | Freq: Once | ORAL | Status: DC | PRN

## 2018-11-27 MED ORDER — SODIUM CHLORIDE 0.9 % IV SOLN
INTRAVENOUS | Status: DC
  Administered 2018-11-27: 09:00:00 via INTRAVENOUS

## 2018-11-27 MED ORDER — FENTANYL CITRATE (PF) 100 MCG/2ML IJ SOLN
INTRAMUSCULAR | Status: AC
  Filled 2018-11-27: qty 2

## 2018-11-27 MED ORDER — CEFAZOLIN SODIUM-DEXTROSE 2-4 GM/100ML-% IV SOLN
2.0000 g | Freq: Once | INTRAVENOUS | Status: AC
  Administered 2018-11-27: 2 g via INTRAVENOUS

## 2018-11-27 MED ORDER — ONDANSETRON HCL 4 MG/2ML IJ SOLN: 4.0000 mg | Freq: Four times a day (QID) | INTRAMUSCULAR | Status: DC | PRN

## 2018-11-27 MED ORDER — CEFAZOLIN SODIUM-DEXTROSE 2-4 GM/100ML-% IV SOLN
INTRAVENOUS | Status: AC
  Filled 2018-11-27: qty 100

## 2018-11-27 MED ORDER — METHYLPREDNISOLONE SODIUM SUCC 125 MG IJ SOLR: 125.0000 mg | Freq: Once | INTRAMUSCULAR | Status: DC | PRN

## 2018-11-27 MED ORDER — MIDAZOLAM HCL 5 MG/5ML IJ SOLN
INTRAMUSCULAR | Status: AC
  Filled 2018-11-27: qty 5

## 2018-11-27 MED ORDER — HYDROMORPHONE HCL 1 MG/ML IJ SOLN: 1.0000 mg | Freq: Once | INTRAMUSCULAR | Status: DC | PRN

## 2018-11-27 MED ORDER — MIDAZOLAM HCL 2 MG/ML PO SYRP: 8.0000 mg | ORAL_SOLUTION | Freq: Once | ORAL | Status: DC | PRN

## 2018-11-27 MED ORDER — DIPHENHYDRAMINE HCL 50 MG/ML IJ SOLN: 50.0000 mg | Freq: Once | INTRAMUSCULAR | Status: DC | PRN

## 2018-11-27 SURGICAL SUPPLY — 11 items
DERMABOND ADVANCED (GAUZE/BANDAGES/DRESSINGS) ×2
DERMABOND ADVANCED .7 DNX12 (GAUZE/BANDAGES/DRESSINGS) ×1 IMPLANT
ELECT REM PT RETURN 9FT ADLT (ELECTROSURGICAL) ×3
ELECTRODE REM PT RTRN 9FT ADLT (ELECTROSURGICAL) ×1 IMPLANT
KIT PORT POWER 8FR ISP CVUE (Port) ×3 IMPLANT
PACK ANGIOGRAPHY (CUSTOM PROCEDURE TRAY) ×3 IMPLANT
PENCIL ELECTRO HAND CTR (MISCELLANEOUS) ×3 IMPLANT
SUT MNCRL AB 4-0 PS2 18 (SUTURE) ×3 IMPLANT
SUT PROLENE 0 CT 1 30 (SUTURE) ×3 IMPLANT
SUT VIC AB 3-0 SH 27 (SUTURE) ×2
SUT VIC AB 3-0 SH 27X BRD (SUTURE) ×1 IMPLANT

## 2018-11-27 NOTE — Op Note (Signed)
      [facility]       Operative Note  Date: 11/27/2018  Preoperative diagnosis:  1. Lung Cancer  Postoperative diagnosis:  Same as above  Procedures: #1. Ultrasound guidance for vascular access to the right internal jugular vein. #2. Fluoroscopic guidance for placement of catheter. #3. Placement of CT compatible Port-A-Cath, right internal jugular vein.  Surgeon: Leotis Pain, MD.   Anesthesia: Local with moderate conscious sedation for approximately 15  minutes using 2 mg of Versed and 50 mcg of Fentanyl  Fluoroscopy time: less than 1 minute  Contrast used: 0  Estimated blood loss: 3 cc  Indication for the procedure:  The patient is a 85 y.o.male with lung cancer.  The patient needs a Port-A-Cath for durable venous access, chemotherapy, lab draws, and CT scans. We are asked to place this. Risks and benefits were discussed and informed consent was obtained.  Description of procedure: The patient was brought to the vascular and interventional radiology suite.  Moderate conscious sedation was administered throughout the procedure during a face to face encounter with the patient with my supervision of the RN administering medicines and monitoring the patient's vital signs, pulse oximetry, telemetry and mental status throughout from the start of the procedure until the patient was taken to the recovery room. The right neck chest and shoulder were sterilely prepped and draped, and a sterile surgical field was created. Ultrasound was used to help visualize a patent right internal jugular vein. This was then accessed under direct ultrasound guidance without difficulty with the Seldinger needle and a permanent image was recorded. A J-wire was placed. After skin nick and dilatation, the peel-away sheath was then placed over the wire. I then anesthetized an area under the clavicle approximately 1-2 fingerbreadths. A transverse incision was created and an inferior pocket was  created with electrocautery and blunt dissection. The port was then brought onto the field, placed into the pocket and secured to the chest wall with 2 Prolene sutures. The catheter was connected to the port and tunneled from the subclavicular incision to the access site. Fluoroscopic guidance was then used to cut the catheter to an appropriate length. The catheter was then placed through the peel-away sheath and the peel-away sheath was removed. The catheter tip was parked in excellent location under fluorocoscopic guidance in the cavoatrial junction. The pocket was then irrigated with antibiotic impregnated saline and the wound was closed with a running 3-0 Vicryl and a 4-0 Monocryl. The access incision was closed with a single 4-0 Monocryl. The Huber needle was used to withdraw blood and flush the port with heparinized saline. Dermabond was then placed as a dressing. The patient tolerated the procedure well and was taken to the recovery room in stable condition.   Leotis Pain 11/27/2018 10:19 AM   This note was created with Dragon Medical transcription system. Any errors in dictation are purely unintentional.

## 2018-11-27 NOTE — H&P (Signed)
[facility] History & Physical Update  The patient was interviewed and re-examined.  The patient's previous History and Physical has been reviewed and is unchanged.  There is no change in the plan of care. We plan to proceed with the scheduled procedure.  Leotis Pain, MD  11/27/2018, 8:05 AM

## 2018-12-01 ENCOUNTER — Other Ambulatory Visit: Payer: Self-pay

## 2018-12-01 ENCOUNTER — Encounter: Payer: Self-pay | Admitting: Hematology and Oncology

## 2018-12-01 NOTE — Progress Notes (Signed)
No new changes noted today. The patient Name and DOB has been verified by phone today. 

## 2018-12-03 NOTE — Progress Notes (Signed)
[facility]  [address], [address] [place], [place] [address] Phone: [phone]  Fax: [phone]   Clinic Day:  12/04/2018  Referring physician: [name], MD  Chief Complaint: Andres Hebert is a 85 y.o. male with stage IV adenocarcinoma of therightlung who is seen for assessment on day 11 of cycle #1 pembrolizumab alone.   HPI: The patient was last seen in the medical oncology clinic on 11/24/2018. At that time, he was chronically fatigued. Blood pressure had improved with decreased anti-hypertensives. Exam revealed decreased breath sounds right base. He received cycle #2 pembrolizumab. He continued Percocet 5-325 1 tablet every 4 hours prn.   He underwent port-a-cath palcement on 11/27/2018 ordered by Dr. Leotis Pain.   During the interim, the he has been doing "ok".  Patient was able to be weighed today (improvement) at 147 pounds. Patient notes feeling no difference since after treatment. His appetite is improving. He is drinking Ensure. His wife notes that he will not eat as much. He does not like to eat breakfast. His wife notes he gets up in the morning between 4 AM - 8 AM.  His feet and ankles continue to be swollen.  His wife used to wrap his feet, but she stopped since he said it was hurting. I advised her to wrap his feet lightly and keep his feet propped up if he is sitting down.   He is still coughing with some sputum production. He denies hemoptysis (improvement). His bowel are normal. He is coughing up white bubbles. When he blows his nose, it is clear/white. He no longer has any rib pain. He is using any pain medication prn. He remains on oxygen. His wife notes that she no longer hears any rattling when he breathes.   He is not active. I encouraged him to be more active. His wife assists him with his ADLs.  He can feed himself.    Past Medical History:  Diagnosis Date  . BPV (benign positional vertigo)   . Chronic constipation   .  Collapsed lung 1959   right  . Diabetes mellitus without complication ([place])   . Hyperlipidemia   . Hypertension     Past Surgical History:  Procedure Laterality Date  . PORTA CATH INSERTION N/A 11/27/2018   Procedure: PORTA CATH INSERTION;  Surgeon: Algernon Huxley, MD;  Location: [facility];  Service: Cardiovascular;  Laterality: N/A;    Family History  Problem Relation Age of Onset  . Stroke Mother   . Prostate cancer Father     Social History:  reports that he quit smoking about 44 years ago. His smoking use included cigarettes. He has a 25.00 pack-year smoking history. He has never used smokeless tobacco. He reports previous alcohol use. He reports that he does not use drugs. Hehas a 25-30 pack-year smoking history.He stoppedsmoking about 45 years ago.He denies any exposure to asbestos, radiation or toxins.He worked for [employer] and [place] lives in [place].His wife's name isSheila. The patient is accompanied by his wife today.  Allergies:  Allergies  Allergen Reactions  . Accupril [Quinapril Hcl] Other (See Comments)    Reaction:dizziness per MR    Current Medications: Current Outpatient Medications  Medication Sig Dispense Refill  . acetaminophen (TYLENOL) 325 MG tablet Take 2 tablets (650 mg total) by mouth every 6 (six) hours as needed for mild pain (or Fever >/= 101).    Marland Kitchen amLODipine (NORVASC) 5 MG tablet Take 2.5 mg by mouth daily.     Marland Kitchen  aspirin 81 MG chewable tablet Chew 81 mg by mouth daily.    . citalopram (CELEXA) 10 MG tablet Take 1 tablet by mouth daily.    . feeding supplement, ENSURE ENLIVE, (ENSURE ENLIVE) LIQD Take 237 mLs by mouth 3 (three) times daily between meals. 371 mL 0  . folic acid (FOLVITE) 1 MG tablet Take 1 tablet (1 mg total) by mouth daily. Start 5-7 days before Alimta chemotherapy. Continue until 21 days after Alimta completed. 100 tablet 3  . metoprolol tartrate (LOPRESSOR) 25 MG tablet Take 0.5 tablets (12.5 mg total) by mouth 2  (two) times daily. 60 tablet 0  . polyethylene glycol (MIRALAX / GLYCOLAX) 17 g packet Take 17 g by mouth 2 (two) times daily. 14 each 0  . potassium chloride (K-DUR,KLOR-CON) 10 MEQ tablet Take 10 mEq by mouth 2 (two) times daily.    . pravastatin (PRAVACHOL) 40 MG tablet Take 40 mg by mouth daily.    . tamsulosin (FLOMAX) 0.4 MG CAPS capsule Take 1 capsule (0.4 mg total) by mouth daily. 30 capsule 0  . traZODone (DESYREL) 50 MG tablet Take 100 mg by mouth.     . triamterene-hydrochlorothiazide (MAXZIDE-25) 37.5-25 MG tablet Take 1 tablet by mouth daily.    Marland Kitchen dexamethasone (DECADRON) 4 MG tablet Take 1 tab two times a day the day before Alimta chemo, then take 2 tabs once a day for 3 days starting the day after chemo. (Patient not taking: Reported on 11/14/2018) 30 tablet 1  . ondansetron (ZOFRAN) 8 MG tablet Take 1 tablet (8 mg total) by mouth 2 (two) times daily as needed (Nausea or vomiting). Start if needed on the third day after chemotherapy. (Patient not taking: Reported on 11/03/2018) 30 tablet 1  . oxyCODONE-acetaminophen (PERCOCET/ROXICET) 5-325 MG tablet Take 1 tablet by mouth every 4 (four) hours as needed for moderate pain. (Patient not taking: Reported on 11/27/2018) 30 tablet 0  . senna (SENOKOT) 8.6 MG TABS tablet Take 1 tablet (8.6 mg total) by mouth daily as needed for mild constipation. (Patient not taking: Reported on 11/14/2018) 30 tablet 0   No current facility-administered medications for this visit.     Review of Systems  Constitutional: Negative for chills, diaphoresis, fever and weight loss.       Doing "ok".  Not active.  Wife assist with his ADLs.  He can feed himself.  HENT: Negative.  Negative for congestion, ear pain, hearing loss, nosebleeds, sinus pain and sore throat.        Clear/white nasal secretions.  Eyes: Negative.  Negative for blurred vision, double vision and photophobia.  Respiratory: Positive for cough (chronic) and sputum production (white bubbles).  Negative for hemoptysis (resolved), shortness of breath and wheezing.        On supplemental oxygen 2 l/min via [place].  Cardiovascular: Positive for leg swelling (feet and ankles). Negative for chest pain, palpitations, orthopnea and PND.  Gastrointestinal: Negative.  Negative for abdominal pain, blood in stool, constipation, diarrhea, melena, nausea and vomiting.       Appetite improving. Drinking Ensure. Normal bowels.  Genitourinary: Negative.  Negative for dysuria, flank pain, frequency, hematuria and urgency.  Musculoskeletal: Negative.  Negative for back pain, falls, joint pain, myalgias and neck pain.  Skin: Negative.  Negative for itching and rash.  Neurological: Positive for weakness (generalized). Negative for dizziness, tingling, sensory change, speech change, focal weakness, seizures and headaches.  Endo/Heme/Allergies: Negative.  Does not bruise/bleed easily.  Psychiatric/Behavioral: Positive for memory loss. Negative for  depression. The patient is not nervous/anxious and does not have insomnia.   All other systems reviewed and are negative.  Performance status (ECOG): 2-3  Vitals Blood pressure (!) 112/58, pulse 69, temperature 98.4 F (36.9 C), temperature source Tympanic, resp. rate 18, height _0  (1.676 m), weight 147 lb (66.7 kg), SpO2 97 %.  Physical Exam  Constitutional: He is oriented to person, place, and time. He appears well-developed and well-nourished.  Elderly gentleman sitting comfortably in a wheelchair in no acute distress. He is examined in the wheelchair.  HENT:  Head: Normocephalic and atraumatic.  Mouth/Throat: Oropharynx is clear and moist. No oropharyngeal exudate.  Cap and mask.  Gray hair and beard.  Dentures.  [place] in place.  Eyes: Pupils are equal, round, and reactive to light. Conjunctivae and EOM are normal. No scleral icterus.  Glasses.  Blue eyes.  Neck: Normal range of motion. Neck supple. No JVD present.  Cardiovascular: Normal rate and normal  heart sounds. Exam reveals no gallop.  No murmur heard. Pulmonary/Chest: Effort normal and breath sounds normal. No respiratory distress. He has no wheezes. He has no rales.  Abdominal: Soft. Bowel sounds are normal. He exhibits no distension and no mass. There is no abdominal tenderness. There is no rebound and no guarding.  Musculoskeletal: Normal range of motion.        General: Tenderness (feet and ankles) and edema (3+ ankle and foot) present.  Lymphadenopathy:       Head (right side): No preauricular, no posterior auricular and no occipital adenopathy present.       Head (left side): No preauricular, no posterior auricular and no occipital adenopathy present.    He has no cervical adenopathy.    He has no axillary adenopathy.       Right: No supraclavicular adenopathy present.       Left: No supraclavicular adenopathy present.  Neurological: He is alert and oriented to person, place, and time.  Skin: Skin is warm, dry and intact. No bruising and no rash noted. He is not diaphoretic. No erythema. No pallor.  Ecchymosis on upper chest.  Psychiatric: He has a normal mood and affect. His behavior is normal. Judgment and thought content normal.  Nursing note and vitals reviewed.   Appointment on 12/04/2018  Component Date Value Ref Range Status  . Sodium 12/04/2018 135  135 - 145 mmol/L Final  . Potassium 12/04/2018 4.0  3.5 - 5.1 mmol/L Final  . Chloride 12/04/2018 97* 98 - 111 mmol/L Final  . CO2 12/04/2018 28  22 - 32 mmol/L Final  . Glucose, Bld 12/04/2018 118* 70 - 99 mg/dL Final  . BUN 12/04/2018 15  8 - 23 mg/dL Final  . Creatinine, Ser 12/04/2018 0.87  0.61 - 1.24 mg/dL Final  . Calcium 12/04/2018 8.8* 8.9 - 10.3 mg/dL Final  . GFR calc non Af Amer 12/04/2018 >60  >60 mL/min Final  . GFR calc Af Amer 12/04/2018 >60  >60 mL/min Final  . Anion gap 12/04/2018 10  5 - 15 Final   Performed at [facility] [place], [address], [place], [place] [address]  . WBC 12/04/2018  7.0  4.0 - 10.5 K/uL Final  . RBC 12/04/2018 3.90* 4.22 - 5.81 MIL/uL Final  . Hemoglobin 12/04/2018 12.0* 13.0 - 17.0 g/dL Final  . HCT 12/04/2018 36.7* 39.0 - 52.0 % Final  . MCV 12/04/2018 94.1  80.0 - 100.0 fL Final  . MCH 12/04/2018 30.8  26.0 - 34.0 pg Final  .  MCHC 12/04/2018 32.7  30.0 - 36.0 g/dL Final  . RDW 12/04/2018 14.5  11.5 - 15.5 % Final  . Platelets 12/04/2018 141* 150 - 400 K/uL Final  . nRBC 12/04/2018 0.0  0.0 - 0.2 % Final  . Neutrophils Relative % 12/04/2018 68  % Final  . Neutro Abs 12/04/2018 4.8  1.7 - 7.7 K/uL Final  . Lymphocytes Relative 12/04/2018 21  % Final  . Lymphs Abs 12/04/2018 1.5  0.7 - 4.0 K/uL Final  . Monocytes Relative 12/04/2018 10  % Final  . Monocytes Absolute 12/04/2018 0.7  0.1 - 1.0 K/uL Final  . Eosinophils Relative 12/04/2018 0  % Final  . Eosinophils Absolute 12/04/2018 0.0  0.0 - 0.5 K/uL Final  . Basophils Relative 12/04/2018 0  % Final  . Basophils Absolute 12/04/2018 0.0  0.0 - 0.1 K/uL Final  . Immature Granulocytes 12/04/2018 1  % Final  . Abs Immature Granulocytes 12/04/2018 0.06  0.00 - 0.07 K/uL Final   Performed at [facility], [address], [place], [place] [address]    Assessment:  RUHAN BORAK is a 85 y.o. male with metastaticadenocarcinoma of thelungs/p right sided thoracentesis on 10/06/2018.Cytologyconfirmed non-small cell carcinoma, favor adenocarcinoma of the lung.He has a 25-30 pack year smoking history.He has clinical stageT4NxM1b.  PD-L1 testing was 80% on 11/09/2018.  Foundation One testing revealed number mutational burden (TMB)18 Muts/Mb, MSI-stable, ATM splice site [id number]>Q, CDKN2A loss, CDKN2B loss, DNMT3A Y533C, FGF10 amplification, KRAS G12C, MTAP loss, and RBM10 A480f*216.  Chest CT angiogramon 10/05/2018 revealed a 8.4 x 6.9 cm right lower lobe mass with numerous pleural nodules and a large right pleural effusion. There was bony destruction of the right lateral 7th and  8th ribs. There was no pulmonary embolism.  Head MRIon 10/07/2018 revealed no metastatic disease or acute intracranial abnormality.There was distal left vertebral artery with poor flow or occlusion, most likely due to atherosclerosis.  PET scanon 10/18/2018 revealed a large hypermetabolic RIGHT lower lobe mass consistent with bronchogenic carcinoma. There was multifocal pleural metastasis within the RIGHT hemithorax. There was one pleural metastatic lesion invading adjacent RIGHT seventh rib, and moderate RIGHT effusion. There was probable RIGHT hilar metastatic adenopathy, and no mediastinal or supraclavicular metastatic adenopathy.   He is s/pthoracentesisx 2 (10/06/2018 and10/05/2018).CT biopsyof the right lower lobelung masson 10/31/2018 was performed for mutational studies.  He is s/p cycle #1 Alimta and pembrolizumab on 11/03/2018.  He is day 11 s/p cycle #1 pembrolizumab alone (11/24/2018).  He received the influenza vaccineon 10/13/2018.  Symptomatically, he is doing better.  He no longer has hemoptysis.  Rib pain has resolved.  He is able to stand up and be weighed.  Exam reveals bilateral ankle and foot edema.  Plan: 1.   Labs today: CBC with diff, CMP, Mg, TSH. 2.Stage IV adenocarcinoma of the lung Patient iss/p cycle #1 Alimta and pembrolizumab.             Patient is s/p cycle #1 pembrolizumab alone.  Clinically he is doing well with resolution of hemoptysis and rib pain. Discuss symptom management.  He has antiemetics and pain medications at home to use on a prn bases.  Interventions are adequate.   3. Weight loss             Weight is 147 pounds.  Continue to encourage caloric intake.  Ensure samples today. 4. Bone metastasis Patienthasright lower ribmetastasis seen on chest CT. No othersites of metastasisonbone scan. Consider future Xgeva. 5Cancer-related  pain Pain  remains well controlled. He has available Percocet 5-325 1 tablet every 4 hours prn pain (last Rx 10/24/2018). 6.   RTC on 12/15/2018 for MD assessment, labs (CBC with diff, CMP, Mg, TSH), and cycle #2 pembrolizumab alone.  I discussed the assessment and treatment plan with the patient.  The patient was provided an opportunity to ask questions and all were answered.  The patient agreed with the plan and demonstrated an understanding of the instructions.  The patient was advised to call back if the symptoms worsen or if the condition fails to improve as anticipated.  I provided 18 minutes of face-to-face time during this this encounter and > 50% was spent counseling as documented under my assessment and plan.    Lequita Asal, MD, PhD    12/04/2018, 3:02 PM  I, Selena Batten, am acting as scribe for [employer]. Mike Gip, MD, PhD.  I, Melissa C. Mike Gip, MD, have reviewed the above documentation for accuracy and completeness, and I agree with the above.

## 2018-12-04 ENCOUNTER — Inpatient Hospital Stay: Payer: Medicare HMO

## 2018-12-04 ENCOUNTER — Other Ambulatory Visit: Payer: Self-pay

## 2018-12-04 ENCOUNTER — Inpatient Hospital Stay: Payer: Medicare HMO | Attending: Hematology and Oncology | Admitting: Hematology and Oncology

## 2018-12-04 VITALS — BP 112/58 | HR 69 | Temp 98.4°F | Resp 18 | Ht 66.0 in | Wt 147.0 lb

## 2018-12-04 DIAGNOSIS — R531 Weakness: Secondary | ICD-10-CM | POA: Diagnosis not present

## 2018-12-04 DIAGNOSIS — M7989 Other specified soft tissue disorders: Secondary | ICD-10-CM | POA: Diagnosis not present

## 2018-12-04 DIAGNOSIS — Z823 Family history of stroke: Secondary | ICD-10-CM | POA: Diagnosis not present

## 2018-12-04 DIAGNOSIS — E119 Type 2 diabetes mellitus without complications: Secondary | ICD-10-CM | POA: Insufficient documentation

## 2018-12-04 DIAGNOSIS — J90 Pleural effusion, not elsewhere classified: Secondary | ICD-10-CM | POA: Diagnosis not present

## 2018-12-04 DIAGNOSIS — Z5112 Encounter for antineoplastic immunotherapy: Secondary | ICD-10-CM | POA: Diagnosis present

## 2018-12-04 DIAGNOSIS — C3431 Malignant neoplasm of lower lobe, right bronchus or lung: Secondary | ICD-10-CM | POA: Diagnosis not present

## 2018-12-04 DIAGNOSIS — C7951 Secondary malignant neoplasm of bone: Secondary | ICD-10-CM | POA: Insufficient documentation

## 2018-12-04 DIAGNOSIS — R0989 Other specified symptoms and signs involving the circulatory and respiratory systems: Secondary | ICD-10-CM | POA: Insufficient documentation

## 2018-12-04 DIAGNOSIS — Z8042 Family history of malignant neoplasm of prostate: Secondary | ICD-10-CM | POA: Insufficient documentation

## 2018-12-04 DIAGNOSIS — Z79899 Other long term (current) drug therapy: Secondary | ICD-10-CM | POA: Diagnosis not present

## 2018-12-04 DIAGNOSIS — N289 Disorder of kidney and ureter, unspecified: Secondary | ICD-10-CM | POA: Diagnosis not present

## 2018-12-04 DIAGNOSIS — R05 Cough: Secondary | ICD-10-CM | POA: Insufficient documentation

## 2018-12-04 DIAGNOSIS — G893 Neoplasm related pain (acute) (chronic): Secondary | ICD-10-CM | POA: Diagnosis not present

## 2018-12-04 DIAGNOSIS — C782 Secondary malignant neoplasm of pleura: Secondary | ICD-10-CM | POA: Diagnosis not present

## 2018-12-04 DIAGNOSIS — R600 Localized edema: Secondary | ICD-10-CM | POA: Insufficient documentation

## 2018-12-04 DIAGNOSIS — Z87891 Personal history of nicotine dependence: Secondary | ICD-10-CM | POA: Insufficient documentation

## 2018-12-04 DIAGNOSIS — R634 Abnormal weight loss: Secondary | ICD-10-CM | POA: Diagnosis not present

## 2018-12-04 LAB — CBC WITH DIFFERENTIAL/PLATELET
Abs Immature Granulocytes: 0.06 10*3/uL (ref 0.00–0.07)
Basophils Absolute: 0 10*3/uL (ref 0.0–0.1)
Basophils Relative: 0 %
Eosinophils Absolute: 0 10*3/uL (ref 0.0–0.5)
Eosinophils Relative: 0 %
HCT: 36.7 % — ABNORMAL LOW (ref 39.0–52.0)
Hemoglobin: 12 g/dL — ABNORMAL LOW (ref 13.0–17.0)
Immature Granulocytes: 1 %
Lymphocytes Relative: 21 %
Lymphs Abs: 1.5 10*3/uL (ref 0.7–4.0)
MCH: 30.8 pg (ref 26.0–34.0)
MCHC: 32.7 g/dL (ref 30.0–36.0)
MCV: 94.1 fL (ref 80.0–100.0)
Monocytes Absolute: 0.7 10*3/uL (ref 0.1–1.0)
Monocytes Relative: 10 %
Neutro Abs: 4.8 10*3/uL (ref 1.7–7.7)
Neutrophils Relative %: 68 %
Platelets: 141 10*3/uL — ABNORMAL LOW (ref 150–400)
RBC: 3.9 MIL/uL — ABNORMAL LOW (ref 4.22–5.81)
RDW: 14.5 % (ref 11.5–15.5)
WBC: 7 10*3/uL (ref 4.0–10.5)
nRBC: 0 % (ref 0.0–0.2)

## 2018-12-04 LAB — BASIC METABOLIC PANEL
Anion gap: 10 (ref 5–15)
BUN: 15 mg/dL (ref 8–23)
CO2: 28 mmol/L (ref 22–32)
Calcium: 8.8 mg/dL — ABNORMAL LOW (ref 8.9–10.3)
Chloride: 97 mmol/L — ABNORMAL LOW (ref 98–111)
Creatinine, Ser: 0.87 mg/dL (ref 0.61–1.24)
GFR calc Af Amer: >60 mL/min (ref >60)
GFR calc non Af Amer: >60 mL/min (ref >60)
Glucose, Bld: 118 mg/dL — ABNORMAL HIGH (ref 70–99)
Potassium: 4 mmol/L (ref 3.5–5.1)
Sodium: 135 mmol/L (ref 135–145)

## 2018-12-04 MED ORDER — LIDOCAINE-PRILOCAINE 2.5-2.5 % EX CREA: 1.0000 "application " | TOPICAL_CREAM | CUTANEOUS | 1 refills | Status: AC | PRN

## 2018-12-05 ENCOUNTER — Telehealth: Payer: Self-pay

## 2018-12-05 NOTE — Telephone Encounter (Signed)
Prior Auth approved for Lidocaine-Prilocaine 2.5-2.5% cream

## 2018-12-07 ENCOUNTER — Encounter: Payer: Self-pay | Admitting: Nurse Practitioner

## 2018-12-07 DIAGNOSIS — J910 Malignant pleural effusion: Secondary | ICD-10-CM | POA: Insufficient documentation

## 2018-12-07 DIAGNOSIS — C7951 Secondary malignant neoplasm of bone: Secondary | ICD-10-CM | POA: Insufficient documentation

## 2018-12-07 DIAGNOSIS — K5900 Constipation, unspecified: Secondary | ICD-10-CM | POA: Insufficient documentation

## 2018-12-12 NOTE — Progress Notes (Signed)
[facility]  [address], [address] [place], [place] [address] Phone: [phone]  Fax: [phone]   Clinic Day:  12/15/2018  Referring physician: [name], MD  Chief Complaint: Andres Hebert is a 85 y.o. male with stage IV adenocarcinoma of therightlung who is seen for assessment prior to cycle #2 pembrolizumab alone.  HPI: The patient was last seen in the medical oncology clinic on 12/04/2018. At that time, he was day 11 of cycle #1 pembrolizumab alone.  He was doing better. He no longer had hemoptysis. Rib pain had resolved.  He was able to stand up and be weighed. Exam revealed bilateral ankle and foot edema. Hematocrit 36.7, hemoglobin 12.0, platelets 141,000, WBC 7,000. Calcium was 8.8. He continued Percocet 5-325 1 tablet every 4 hours prn.   During the interim, the patient said "I'm here". He notes rib pain and pain under his lung 1 day ago which have since resolved. His wife notes that he is hardly taking any Percocet now. His breathing is the same. He is coughing with clear sputum production. He denies hemoptysis.    His wife notes he is about the same. He is sitting and resting all day. He reports that he can go to the restroom at night on his own. For safety reasons his wife will assist with ADLs.  His weight is up 2 pounds. He is eating well. He denies any nausea, vomiting, and diarrhea.    Past Medical History:  Diagnosis Date  . BPV (benign positional vertigo)   . Chronic constipation   . Collapsed lung 1959   right  . Diabetes mellitus without complication ([place])   . Hyperlipidemia   . Hypertension     Past Surgical History:  Procedure Laterality Date  . PORTA CATH INSERTION N/A 11/27/2018   Procedure: PORTA CATH INSERTION;  Surgeon: Algernon Huxley, MD;  Location: [facility];  Service: Cardiovascular;  Laterality: N/A;    Family History  Problem Relation Age of Onset  . Stroke Mother   . Prostate cancer Father      Social History:  reports that he quit smoking about 44 years ago. His smoking use included cigarettes. He has a 25.00 pack-year smoking history. He has never used smokeless tobacco. He reports previous alcohol use. He reports that he does not use drugs. Hehas a 25-30 pack-year smoking history.He stoppedsmoking about 45 years ago.He denies any exposure to asbestos, radiation or toxins.He worked for [employer] and [place] lives in [place].His wife's name isSheila. The patient is accompanied by his wife via face time today.  Allergies:  Allergies  Allergen Reactions  . Accupril [Quinapril Hcl] Other (See Comments)    Reaction:dizziness per MR    Current Medications: Current Outpatient Medications  Medication Sig Dispense Refill  . acetaminophen (TYLENOL) 325 MG tablet Take 2 tablets (650 mg total) by mouth every 6 (six) hours as needed for mild pain (or Fever >/= 101).    Marland Kitchen amLODipine (NORVASC) 5 MG tablet Take 2.5 mg by mouth daily.     Marland Kitchen aspirin 81 MG chewable tablet Chew 81 mg by mouth daily.    . citalopram (CELEXA) 10 MG tablet Take 1 tablet by mouth daily.    . feeding supplement, ENSURE ENLIVE, (ENSURE ENLIVE) LIQD Take 237 mLs by mouth 3 (three) times daily between meals. 076 mL 0  . folic acid (FOLVITE) 1 MG tablet Take 1 tablet (1 mg total) by mouth daily. Start 5-7 days before Alimta chemotherapy.  Continue until 21 days after Alimta completed. 100 tablet 3  . metoprolol tartrate (LOPRESSOR) 25 MG tablet Take 0.5 tablets (12.5 mg total) by mouth 2 (two) times daily. 60 tablet 0  . oxyCODONE-acetaminophen (PERCOCET/ROXICET) 5-325 MG tablet Take 1 tablet by mouth every 4 (four) hours as needed for moderate pain. 30 tablet 0  . polyethylene glycol (MIRALAX / GLYCOLAX) 17 g packet Take 17 g by mouth 2 (two) times daily. 14 each 0  . potassium chloride (K-DUR,KLOR-CON) 10 MEQ tablet Take 10 mEq by mouth 2 (two) times daily.    . pravastatin (PRAVACHOL) 40 MG tablet Take 40 mg by  mouth daily.    Marland Kitchen senna (SENOKOT) 8.6 MG TABS tablet Take 1 tablet (8.6 mg total) by mouth daily as needed for mild constipation. 30 tablet 0  . tamsulosin (FLOMAX) 0.4 MG CAPS capsule Take 1 capsule (0.4 mg total) by mouth daily. 30 capsule 0  . traZODone (DESYREL) 50 MG tablet Take 100 mg by mouth.     . triamterene-hydrochlorothiazide (MAXZIDE-25) 37.5-25 MG tablet Take 1 tablet by mouth daily.    Marland Kitchen dexamethasone (DECADRON) 4 MG tablet Take 1 tab two times a day the day before Alimta chemo, then take 2 tabs once a day for 3 days starting the day after chemo. (Patient not taking: Reported on 11/14/2018) 30 tablet 1  . lidocaine-prilocaine (EMLA) cream Apply 1 application topically as needed. (Patient not taking: Reported on 12/13/2018) 30 g 1  . ondansetron (ZOFRAN) 8 MG tablet Take 1 tablet (8 mg total) by mouth 2 (two) times daily as needed (Nausea or vomiting). Start if needed on the third day after chemotherapy. (Patient not taking: Reported on 11/03/2018) 30 tablet 1   No current facility-administered medications for this visit.    Facility-Administered Medications Ordered in Other Visits  Medication Dose Route Frequency Provider Last Rate Last Dose  . heparin lock flush 100 unit/mL  500 Units Intravenous Once Corcoran, Melissa C, MD      . sodium chloride flush (NS) 0.9 % injection 10 mL  10 mL Intravenous PRN Nolon Stalls C, MD   10 mL at 12/15/18 0940    Review of Systems  Constitutional: Negative for chills, diaphoresis, fever and weight loss (up 2 pounds).       "I'm here".  Not active.  Wife assist with his ADLs.  HENT: Negative.  Negative for congestion, ear pain, hearing loss, nosebleeds, sinus pain and sore throat.        Clear/white nasal secretions.  Eyes: Negative.  Negative for blurred vision, double vision and photophobia.  Respiratory: Positive for cough (chronic) and sputum production (white bubbles). Negative for hemoptysis, shortness of breath and wheezing.         On supplemental oxygen 2 l/min via .  Cardiovascular: Positive for leg swelling (feet and ankles). Negative for chest pain, palpitations, orthopnea and PND.  Gastrointestinal: Negative.  Negative for abdominal pain, blood in stool, constipation, diarrhea, melena, nausea and vomiting.       Eating well.  Genitourinary: Negative.  Negative for dysuria, flank pain, frequency, hematuria and urgency.  Musculoskeletal: Negative.  Negative for back pain, falls, joint pain, myalgias and neck pain.  Skin: Negative.  Negative for itching and rash.  Neurological: Positive for weakness (generalized). Negative for dizziness, tingling, sensory change, speech change, focal weakness, seizures and headaches.  Endo/Heme/Allergies: Negative.  Does not bruise/bleed easily.  Psychiatric/Behavioral: Positive for memory loss. Negative for depression. The patient is not nervous/anxious and does  not have insomnia.   All other systems reviewed and are negative.  Performance status (ECOG): 2-3  Vitals Blood pressure 114/65, pulse (!) 55, temperature (!) 97.5 F (36.4 C), temperature source Tympanic, resp. rate 18, height 5' 6"  (1.676 m), weight 149 lb 14.4 oz (68 kg), SpO2 96 %.   Physical Exam  Constitutional: He is oriented to person, place, and time. He appears well-developed and well-nourished.  Patient sitting comfortably in a wheelchair in no acute distress.  He is examined in the wheelchair.  HENT:  Head: Normocephalic and atraumatic.  Mouth/Throat: Oropharynx is clear and moist. No oropharyngeal exudate.  Cap and mask.  Gray hair and beard.  Dentures.  [place] in place.  Eyes: Pupils are equal, round, and reactive to light. Conjunctivae and EOM are normal. No scleral icterus.  Glasses.  Blue eyes.  Neck: Normal range of motion. No JVD present.  Cardiovascular: Normal rate and normal heart sounds. Exam reveals no gallop.  No murmur heard. Pulmonary/Chest: Effort normal and breath sounds normal. No respiratory  distress. He has no wheezes. He has no rales.  Abdominal: Bowel sounds are normal. He exhibits no distension and no mass. There is no abdominal tenderness. There is no rebound and no guarding.  Musculoskeletal: Normal range of motion.        General: Edema (ankle and foot) present. No tenderness.  Lymphadenopathy:       Head (right side): No preauricular, no posterior auricular and no occipital adenopathy present.       Head (left side): No preauricular, no posterior auricular and no occipital adenopathy present.    He has no cervical adenopathy.    He has no axillary adenopathy.       Right: No inguinal and no supraclavicular adenopathy present.       Left: No inguinal and no supraclavicular adenopathy present.  Neurological: He is alert and oriented to person, place, and time.  Skin: Skin is warm, dry and intact. No bruising and no rash noted. He is not diaphoretic. No erythema. No pallor.  Psychiatric: He has a normal mood and affect. His behavior is normal. Judgment and thought content normal.  Nursing note and vitals reviewed.   Infusion on 12/15/2018  Component Date Value Ref Range Status  . WBC 12/15/2018 5.6  4.0 - 10.5 K/uL Final  . RBC 12/15/2018 3.55* 4.22 - 5.81 MIL/uL Final  . Hemoglobin 12/15/2018 11.2* 13.0 - 17.0 g/dL Final  . HCT 12/15/2018 34.3* 39.0 - 52.0 % Final  . MCV 12/15/2018 96.6  80.0 - 100.0 fL Final  . MCH 12/15/2018 31.5  26.0 - 34.0 pg Final  . MCHC 12/15/2018 32.7  30.0 - 36.0 g/dL Final  . RDW 12/15/2018 14.9  11.5 - 15.5 % Final  . Platelets 12/15/2018 133* 150 - 400 K/uL Final  . nRBC 12/15/2018 0.0  0.0 - 0.2 % Final  . Neutrophils Relative % 12/15/2018 68  % Final  . Neutro Abs 12/15/2018 3.8  1.7 - 7.7 K/uL Final  . Lymphocytes Relative 12/15/2018 22  % Final  . Lymphs Abs 12/15/2018 1.2  0.7 - 4.0 K/uL Final  . Monocytes Relative 12/15/2018 9  % Final  . Monocytes Absolute 12/15/2018 0.5  0.1 - 1.0 K/uL Final  . Eosinophils Relative 12/15/2018  1  % Final  . Eosinophils Absolute 12/15/2018 0.0  0.0 - 0.5 K/uL Final  . Basophils Relative 12/15/2018 0  % Final  . Basophils Absolute 12/15/2018 0.0  0.0 - 0.1 K/uL  Final  . Immature Granulocytes 12/15/2018 0  % Final  . Abs Immature Granulocytes 12/15/2018 0.02  0.00 - 0.07 K/uL Final   Performed at [facility], [address] [place], [place] [address]    Assessment:  Andres Hebert is a 85 y.o. male with metastaticadenocarcinoma of thelungs/p right sided thoracentesis on 10/06/2018.Cytologyconfirmed non-small cell carcinoma, favor adenocarcinoma of the lung.He has a 25-30 pack year smoking history.He has clinical stageT4NxM1b.  PD-L1 testing was 80% on 11/09/2018. Foundation Onetesting revealednumber mutational burden (TMB)18 Muts/Mb, MSI-stable, ATM splice site 6333-5K>T, CDKN2A loss, CDKN2B loss, DNMT3A Y533C, FGF10 amplification, KRAS G12C, MTAP loss, and RBM10 A464f*216.  Chest CT angiogramon 10/05/2018 revealed a 8.4 x 6.9 cm right lower lobe mass with numerous pleural nodules and a large right pleural effusion. There was bony destruction of the right lateral 7th and 8th ribs. There was no pulmonary embolism.  Head MRIon 10/07/2018 revealed no metastatic disease or acute intracranial abnormality.There was distal left vertebral artery with poor flow or occlusion, most likely due to atherosclerosis.  PET scanon 10/18/2018 revealed a large hypermetabolic RIGHT lower lobe mass consistent with bronchogenic carcinoma. There was multifocal pleural metastasis within the RIGHT hemithorax. There was one pleural metastatic lesion invading adjacent RIGHT seventh rib, and moderate RIGHT effusion. There was probable RIGHT hilar metastatic adenopathy, and no mediastinal or supraclavicular metastatic adenopathy.   He is s/pthoracentesisx 2 (10/06/2018 and10/05/2018).CT biopsyof the right lower lobelung masson 10/31/2018 was performed for mutational  studies.  He is s/p cycle #1 Alimta and pembrolizumabon 11/03/2018.  He is day 22 s/p cycle #1 pembrolizumab alone (11/24/2018).  He received the influenza vaccineon 10/13/2018.  Symptomatically, he is doing well.  He is eating well and gaining weight.  Pain has improved; he is rarely taking Percocet.  Exam is stable.  Creatinine is 1.22.  Plan: 1.   Labs today: CBC with diff, CMP, Mg, TSH. 2.Stage IV adenocarcinoma of the lung Patient iss/p cycle #1 Alimta and pembrolizumab. Patient is s/p cycle #1 pembrolizumab alone.             Clinically, he is doing well.   His pain has improved.   He denies hemoptysis. Cycle #2 pembrolizumab today.   3. Weight loss Weight is 149 pounds (up 2 pounds).             Continue to encourage caloric intake. 4. Bone metastasis Patienthasright lower ribmetastasis seen on chest CT. No othersites of metastasisonbone scan. Xgeva currently on hold. 5Cancer-related pain Pain is well controlled. He has Percocet 5-325 1 tablet every 4 hours prn pain (last Rx09/29/2020).   Pain medication use is minimal. 6.Renal insufficiency  Creatinine 1.22 today.  Baseline creatinine 0.69 - 0.87 in past 1 month.  Urinalysis today is benign (no evidence of nephritis).  RTC on 12/25/2018 for labs (BMP).   If creatinine increasing, referral to nephrology.   7.   RTC in 3 weeks for MD assessment, labs (CBC with diff, CMP, Mg, TSH), and cycle #3 pembrolizumab alone.  I discussed the assessment and treatment plan with the patient.  The patient was provided an opportunity to ask questions and all were answered.  The patient agreed with the plan and demonstrated an understanding of the instructions.  The patient was advised to call back if the symptoms worsen or if the condition fails to improve as anticipated.   MLequita Asal MD, PhD     12/15/2018, 10:01 AM  I, ASelena Batten am acting as scribe  for Melissa C. Mike Gip, MD, PhD.  I, Melissa C. Mike Gip, MD, have reviewed the above documentation for accuracy and completeness, and I agree with the above.

## 2018-12-13 ENCOUNTER — Other Ambulatory Visit: Payer: Self-pay

## 2018-12-13 NOTE — Progress Notes (Signed)
The patient wife states he is having trouble with starting to urinate.

## 2018-12-15 ENCOUNTER — Inpatient Hospital Stay: Payer: Medicare HMO

## 2018-12-15 ENCOUNTER — Inpatient Hospital Stay (HOSPITAL_BASED_OUTPATIENT_CLINIC_OR_DEPARTMENT_OTHER): Payer: Medicare HMO | Admitting: Hematology and Oncology

## 2018-12-15 ENCOUNTER — Other Ambulatory Visit: Payer: Self-pay

## 2018-12-15 ENCOUNTER — Encounter: Payer: Self-pay | Admitting: Hematology and Oncology

## 2018-12-15 VITALS — BP 114/65 | HR 55 | Temp 97.5°F | Resp 18 | Ht 66.0 in | Wt 149.9 lb

## 2018-12-15 VITALS — BP 151/73 | HR 51 | Temp 97.4°F | Resp 18

## 2018-12-15 DIAGNOSIS — C7951 Secondary malignant neoplasm of bone: Secondary | ICD-10-CM

## 2018-12-15 DIAGNOSIS — C3431 Malignant neoplasm of lower lobe, right bronchus or lung: Secondary | ICD-10-CM

## 2018-12-15 DIAGNOSIS — Z5112 Encounter for antineoplastic immunotherapy: Secondary | ICD-10-CM | POA: Diagnosis not present

## 2018-12-15 DIAGNOSIS — R7989 Other specified abnormal findings of blood chemistry: Secondary | ICD-10-CM

## 2018-12-15 DIAGNOSIS — J910 Malignant pleural effusion: Secondary | ICD-10-CM

## 2018-12-15 DIAGNOSIS — R634 Abnormal weight loss: Secondary | ICD-10-CM

## 2018-12-15 DIAGNOSIS — G893 Neoplasm related pain (acute) (chronic): Secondary | ICD-10-CM

## 2018-12-15 LAB — COMPREHENSIVE METABOLIC PANEL
ALT: 11 U/L (ref 0–44)
AST: 17 U/L (ref 15–41)
Albumin: 3.1 g/dL — ABNORMAL LOW (ref 3.5–5.0)
Alkaline Phosphatase: 62 U/L (ref 38–126)
Anion gap: 8 (ref 5–15)
BUN: 20 mg/dL (ref 8–23)
CO2: 30 mmol/L (ref 22–32)
Calcium: 8.9 mg/dL (ref 8.9–10.3)
Chloride: 96 mmol/L — ABNORMAL LOW (ref 98–111)
Creatinine, Ser: 1.22 mg/dL (ref 0.61–1.24)
GFR calc Af Amer: >60 mL/min (ref >60)
GFR calc non Af Amer: 54 mL/min — ABNORMAL LOW (ref >60)
Glucose, Bld: 168 mg/dL — ABNORMAL HIGH (ref 70–99)
Potassium: 3.6 mmol/L (ref 3.5–5.1)
Sodium: 134 mmol/L — ABNORMAL LOW (ref 135–145)
Total Bilirubin: 0.5 mg/dL (ref 0.3–1.2)
Total Protein: 6.7 g/dL (ref 6.5–8.1)

## 2018-12-15 LAB — CBC WITH DIFFERENTIAL/PLATELET
Abs Immature Granulocytes: 0.02 10*3/uL (ref 0.00–0.07)
Basophils Absolute: 0 10*3/uL (ref 0.0–0.1)
Basophils Relative: 0 %
Eosinophils Absolute: 0 10*3/uL (ref 0.0–0.5)
Eosinophils Relative: 1 %
HCT: 34.3 % — ABNORMAL LOW (ref 39.0–52.0)
Hemoglobin: 11.2 g/dL — ABNORMAL LOW (ref 13.0–17.0)
Immature Granulocytes: 0 %
Lymphocytes Relative: 22 %
Lymphs Abs: 1.2 10*3/uL (ref 0.7–4.0)
MCH: 31.5 pg (ref 26.0–34.0)
MCHC: 32.7 g/dL (ref 30.0–36.0)
MCV: 96.6 fL (ref 80.0–100.0)
Monocytes Absolute: 0.5 10*3/uL (ref 0.1–1.0)
Monocytes Relative: 9 %
Neutro Abs: 3.8 10*3/uL (ref 1.7–7.7)
Neutrophils Relative %: 68 %
Platelets: 133 10*3/uL — ABNORMAL LOW (ref 150–400)
RBC: 3.55 MIL/uL — ABNORMAL LOW (ref 4.22–5.81)
RDW: 14.9 % (ref 11.5–15.5)
WBC: 5.6 10*3/uL (ref 4.0–10.5)
nRBC: 0 % (ref 0.0–0.2)

## 2018-12-15 LAB — URINALYSIS, COMPLETE (UACMP) WITH MICROSCOPIC
Bilirubin Urine: NEGATIVE
Glucose, UA: NEGATIVE mg/dL
Hgb urine dipstick: NEGATIVE
Ketones, ur: NEGATIVE mg/dL
Leukocytes,Ua: NEGATIVE
Nitrite: NEGATIVE
Protein, ur: NEGATIVE mg/dL
Specific Gravity, Urine: 1.02 (ref 1.005–1.030)
pH: 5.5 (ref 5.0–8.0)

## 2018-12-15 LAB — MAGNESIUM: Magnesium: 2 mg/dL (ref 1.7–2.4)

## 2018-12-15 LAB — TSH: TSH: 2.996 u[IU]/mL (ref 0.350–4.500)

## 2018-12-15 MED ORDER — SODIUM CHLORIDE 0.9 % IV SOLN
200.0000 mg | Freq: Once | INTRAVENOUS | Status: AC
  Administered 2018-12-15: 200 mg via INTRAVENOUS
  Filled 2018-12-15: qty 8

## 2018-12-15 MED ORDER — HEPARIN SOD (PORK) LOCK FLUSH 100 UNIT/ML IV SOLN
500.0000 [IU] | Freq: Once | INTRAVENOUS | Status: AC
  Administered 2018-12-15: 500 [IU] via INTRAVENOUS

## 2018-12-15 MED ORDER — SODIUM CHLORIDE 0.9 % IV SOLN
Freq: Once | INTRAVENOUS | Status: AC
  Administered 2018-12-15: 13:00:00 via INTRAVENOUS
  Filled 2018-12-15: qty 250

## 2018-12-15 MED ORDER — SODIUM CHLORIDE 0.9% FLUSH
10.0000 mL | INTRAVENOUS | Status: DC | PRN
  Administered 2018-12-15: 10:00:00 10 mL via INTRAVENOUS
  Filled 2018-12-15: qty 10

## 2018-12-15 MED ORDER — SODIUM CHLORIDE 0.9 % IV SOLN
Freq: Once | INTRAVENOUS | Status: AC
  Administered 2018-12-15: 11:00:00 via INTRAVENOUS
  Filled 2018-12-15: qty 250

## 2018-12-15 NOTE — Patient Instructions (Signed)
Pembrolizumab injection What is this medicine? PEMBROLIZUMAB (pem broe liz ue mab) is a monoclonal antibody. It is used to treat bladder cancer, cervical cancer, endometrial cancer, esophageal cancer, head and neck cancer, hepatocellular cancer, Hodgkin lymphoma, kidney cancer, lymphoma, melanoma, Merkel cell carcinoma, lung cancer, stomach cancer, urothelial cancer, and cancers that have a certain genetic condition. This medicine may be used for other purposes; ask your health care provider or pharmacist if you have questions. COMMON BRAND NAME(S): Keytruda What should I tell my health care provider before I take this medicine? They need to know if you have any of these conditions:  diabetes  immune system problems  inflammatory bowel disease  liver disease  lung or breathing disease  lupus  received or scheduled to receive an organ transplant or a stem-cell transplant that uses donor stem cells  an unusual or allergic reaction to pembrolizumab, other medicines, foods, dyes, or preservatives  pregnant or trying to get pregnant  breast-feeding How should I use this medicine? This medicine is for infusion into a vein. It is given by a health care professional in a hospital or clinic setting. A special MedGuide will be given to you before each treatment. Be sure to read this information carefully each time. Talk to your pediatrician regarding the use of this medicine in children. While this drug may be prescribed for selected conditions, precautions do apply. Overdosage: If you think you have taken too much of this medicine contact a poison control center or emergency room at once. NOTE: This medicine is only for you. Do not share this medicine with others. What if I miss a dose? It is important not to miss your dose. Call your doctor or health care professional if you are unable to keep an appointment. What may interact with this medicine? Interactions have not been studied. Give  your health care provider a list of all the medicines, herbs, non-prescription drugs, or dietary supplements you use. Also tell them if you smoke, drink alcohol, or use illegal drugs. Some items may interact with your medicine. This list may not describe all possible interactions. Give your health care provider a list of all the medicines, herbs, non-prescription drugs, or dietary supplements you use. Also tell them if you smoke, drink alcohol, or use illegal drugs. Some items may interact with your medicine. What should I watch for while using this medicine? Your condition will be monitored carefully while you are receiving this medicine. You may need blood work done while you are taking this medicine. Do not become pregnant while taking this medicine or for 4 months after stopping it. Women should inform their doctor if they wish to become pregnant or think they might be pregnant. There is a potential for serious side effects to an unborn child. Talk to your health care professional or pharmacist for more information. Do not breast-feed an infant while taking this medicine or for 4 months after the last dose. What side effects may I notice from receiving this medicine? Side effects that you should report to your doctor or health care professional as soon as possible:  allergic reactions like skin rash, itching or hives, swelling of the face, lips, or tongue  bloody or black, tarry  breathing problems  changes in vision  chest pain  chills  confusion  constipation  cough  diarrhea  dizziness or feeling faint or lightheaded  fast or irregular heartbeat  fever  flushing  hair loss  joint pain  low blood counts - this  medicine may decrease the number of white blood cells, red blood cells and platelets. You may be at increased risk for infections and bleeding.  muscle pain  muscle weakness  persistent headache  redness, blistering, peeling or loosening of the skin,  including inside the mouth  signs and symptoms of high blood sugar such as dizziness; dry mouth; dry skin; fruity breath; nausea; stomach pain; increased hunger or thirst; increased urination  signs and symptoms of kidney injury like trouble passing urine or change in the amount of urine  signs and symptoms of liver injury like dark urine, light-colored stools, loss of appetite, nausea, right upper belly pain, yellowing of the eyes or skin  sweating  swollen lymph nodes  weight loss Side effects that usually do not require medical attention (report to your doctor or health care professional if they continue or are bothersome):  decreased appetite  muscle pain  tiredness This list may not describe all possible side effects. Call your doctor for medical advice about side effects. You may report side effects to FDA at 1-800-FDA-1088. Where should I keep my medicine? This drug is given in a hospital or clinic and will not be stored at home. NOTE: This sheet is a summary. It may not cover all possible information. If you have questions about this medicine, talk to your doctor, pharmacist, or health care provider.  2020 Elsevier/Gold Standard (2018-02-07 13:46:58)

## 2018-12-16 LAB — T4: T4, Total: 7.8 ug/dL (ref 4.5–12.0)

## 2018-12-18 ENCOUNTER — Ambulatory Visit: Payer: Medicare HMO | Admitting: Hematology and Oncology

## 2018-12-18 ENCOUNTER — Ambulatory Visit: Payer: Medicare HMO

## 2018-12-18 ENCOUNTER — Other Ambulatory Visit: Payer: Medicare HMO

## 2019-01-02 NOTE — Progress Notes (Signed)
[facility]  [address] [place], [place] [address] Phone: [phone]  Fax: [phone]   Clinic Day:  01/04/2019  Referring physician: [name], MD  Chief Complaint: Andres Hebert is a 85 y.o. male with stage IV adenocarcinoma of therightlung who is seen for assessment prior to cycle #3 pembrolizumab alone.   HPI: The patient was last seen in the medical oncology clinic on 12/15/2018. At that time, he was doing well.  He was eating well and gaining weight.  Pain had improved; he was rarely taking Percocet. Exam was stable. Hematocrit was 34.3, hemoglobin 11.2, platelets 133,000, and WBC 5600 ([place] 3800). Creatinine was 1.22. He received cycle #2 pembrolizumab alone.  He continued Percocet 5-325 1 tablet every 4 hours prn.   During the interim, he felt "tired and has had no energy". He reports being tired of sitting. Some days he can get up on his own and other days he needs assistance. He is not active at home. His wife reports that home health came out x 1 month ago. Home health showed him where to walk safely. He states that he relies heavily on his wife.   He reports being dizzy and does not like to walk too far from his chair. He has nasal secretions.  He has a cough with phlegm production. His weight is down 1 pound. He is eating well. He has no nausea, vomiting, or diarrhea. He has some constipation. His wife notes that his skin flakes up. She notes that he has sores on his bottom secondary to sitting all the time and she is using baby butt cream. He reports pain from the skin break down. He has swelling in his ankles and feet.   His wife notes that he doesn't use his oxygen at home only; he only uses oxygen when traveling. She reports that he is only using his pain medication prn.    Past Medical History:  Diagnosis Date  . BPV (benign positional vertigo)   . Chronic constipation   . Collapsed lung 1959   right  . Diabetes mellitus  without complication ([place])   . Hyperlipidemia   . Hypertension     Past Surgical History:  Procedure Laterality Date  . PORTA CATH INSERTION N/A 11/27/2018   Procedure: PORTA CATH INSERTION;  Surgeon: Algernon Huxley, MD;  Location: [facility];  Service: Cardiovascular;  Laterality: N/A;    Family History  Problem Relation Age of Onset  . Stroke Mother   . Prostate cancer Father     Social History:  reports that he quit smoking about 44 years ago. His smoking use included cigarettes. He has a 25.00 pack-year smoking history. He has never used smokeless tobacco. He reports previous alcohol use. He reports that he does not use drugs. Hehas a 25-30 pack-year smoking history.He stoppedsmoking about 45 years ago.He denies any exposure to asbestos, radiation or toxins.He worked for [employer] and [place] lives in [place].His wife's name isSheila. The patient is accompanied by his wife on Ipad today.  Allergies:  Allergies  Allergen Reactions  . Accupril [Quinapril Hcl] Other (See Comments)    Reaction:dizziness per MR    Current Medications: Current Outpatient Medications  Medication Sig Dispense Refill  . acetaminophen (TYLENOL) 325 MG tablet Take 2 tablets (650 mg total) by mouth every 6 (six) hours as needed for mild pain (or Fever >/= 101).    Marland Kitchen amLODipine (NORVASC) 5 MG tablet Take 2.5 mg by mouth  daily.     . aspirin 81 MG chewable tablet Chew 81 mg by mouth daily.    . citalopram (CELEXA) 10 MG tablet Take 1 tablet by mouth daily.    . feeding supplement, ENSURE ENLIVE, (ENSURE ENLIVE) LIQD Take 237 mLs by mouth 3 (three) times daily between meals. 937 mL 0  . folic acid (FOLVITE) 1 MG tablet Take 1 tablet (1 mg total) by mouth daily. Start 5-7 days before Alimta chemotherapy. Continue until 21 days after Alimta completed. 100 tablet 3  . lidocaine-prilocaine (EMLA) cream Apply 1 application topically as needed. 30 g 1  . polyethylene glycol (MIRALAX / GLYCOLAX) 17 g  packet Take 17 g by mouth 2 (two) times daily. 14 each 0  . potassium chloride (K-DUR,KLOR-CON) 10 MEQ tablet Take 10 mEq by mouth 2 (two) times daily.    . pravastatin (PRAVACHOL) 40 MG tablet Take 40 mg by mouth daily.    Marland Kitchen senna (SENOKOT) 8.6 MG TABS tablet Take 1 tablet (8.6 mg total) by mouth daily as needed for mild constipation. 30 tablet 0  . traZODone (DESYREL) 50 MG tablet Take 100 mg by mouth.     . triamterene-hydrochlorothiazide (MAXZIDE-25) 37.5-25 MG tablet Take 1 tablet by mouth daily.    Marland Kitchen dexamethasone (DECADRON) 4 MG tablet Take 1 tab two times a day the day before Alimta chemo, then take 2 tabs once a day for 3 days starting the day after chemo. (Patient not taking: Reported on 11/14/2018) 30 tablet 1  . metoprolol tartrate (LOPRESSOR) 25 MG tablet Take 0.5 tablets (12.5 mg total) by mouth 2 (two) times daily. (Patient not taking: Reported on 01/03/2019) 60 tablet 0  . ondansetron (ZOFRAN) 8 MG tablet Take 1 tablet (8 mg total) by mouth 2 (two) times daily as needed (Nausea or vomiting). Start if needed on the third day after chemotherapy. (Patient not taking: Reported on 11/03/2018) 30 tablet 1  . oxyCODONE-acetaminophen (PERCOCET/ROXICET) 5-325 MG tablet Take 1 tablet by mouth every 4 (four) hours as needed for moderate pain. (Patient not taking: Reported on 01/03/2019) 30 tablet 0  . tamsulosin (FLOMAX) 0.4 MG CAPS capsule Take 1 capsule (0.4 mg total) by mouth daily. (Patient not taking: Reported on 01/03/2019) 30 capsule 0   No current facility-administered medications for this visit.    Review of Systems  Constitutional: Positive for malaise/fatigue (not much energy) and weight loss (1 lb). Negative for chills, diaphoresis and fever.       Feels "tired".  Not active.  Wife assist with his ADLs.  HENT: Negative.  Negative for congestion, ear pain, hearing loss, nosebleeds, sinus pain and sore throat.        Clear/white nasal secretions.  Eyes: Negative.  Negative for blurred  vision, double vision and photophobia.  Respiratory: Positive for cough (chronic) and sputum production (phlegm). Negative for hemoptysis, shortness of breath and wheezing.        On supplemental oxygen 2 l/min via [place] prn.  Cardiovascular: Positive for leg swelling (feet and ankles). Negative for chest pain, palpitations, orthopnea and PND.  Gastrointestinal: Positive for constipation. Negative for abdominal pain, diarrhea, melena and vomiting.       Eating "ok".  Genitourinary: Negative.  Negative for dysuria, flank pain, frequency, hematuria and urgency.  Musculoskeletal: Negative.  Negative for back pain, falls, joint pain, myalgias and neck pain.  Skin: Negative.  Negative for itching.       Skin flakes. Sores on bottom secondary to sitting; using "baby butt  cream". Pain from skin break down.  Neurological: Positive for dizziness and weakness (generalized). Negative for tingling, sensory change, speech change, focal weakness and seizures.  Endo/Heme/Allergies: Negative.  Does not bruise/bleed easily.  Psychiatric/Behavioral: Positive for memory loss. Negative for depression. The patient is not nervous/anxious and does not have insomnia.   All other systems reviewed and are negative.  Performance status (ECOG):  2-3  Vitals Blood pressure 116/74, pulse 70, temperature (!) 96.7 F (35.9 C), temperature source Tympanic, resp. rate 18, height 5' 6"  (1.676 m), weight 148 lb 5.9 oz (67.3 kg), SpO2 98 %.   Physical Exam  Constitutional: He is oriented to person, place, and time. He appears well-developed and well-nourished. No distress.  Patient sitting comfortably in a wheelchair.  He is examined in the wheelchair.  HENT:  Head: Normocephalic and atraumatic.  Mouth/Throat: Oropharynx is clear and moist. No oropharyngeal exudate.  Cap and mask.  Gray hair and beard.  Dentures.  [place] in place.  Eyes: Pupils are equal, round, and reactive to light. Conjunctivae and EOM are normal. No scleral  icterus.  Glasses.  Blue eyes.  Neck: No JVD present.  Cardiovascular: Normal rate and normal heart sounds. Exam reveals no gallop.  No murmur heard. Pulmonary/Chest: Effort normal and breath sounds normal. No respiratory distress. He has no wheezes. He has no rales.  Abdominal: Soft. Bowel sounds are normal. He exhibits no distension and no mass. There is no abdominal tenderness. There is no rebound and no guarding.  Musculoskeletal:        General: Edema (trace left; 1+ right) present. No tenderness. Normal range of motion.     Cervical back: Normal range of motion and neck supple.  Lymphadenopathy:       Head (right side): No preauricular, no posterior auricular and no occipital adenopathy present.       Head (left side): No preauricular, no posterior auricular and no occipital adenopathy present.    He has no cervical adenopathy.    He has no axillary adenopathy.       Right: No inguinal and no supraclavicular adenopathy present.       Left: No inguinal and no supraclavicular adenopathy present.  Neurological: He is alert and oriented to person, place, and time.  Skin: Skin is warm, dry and intact. No bruising and no rash noted. He is not diaphoretic. No erythema. No pallor.  Bottom with white paste and underlying early midline grade I sacral decubitus.  Psychiatric: He has a normal mood and affect. His behavior is normal. Judgment and thought content normal.  Nursing note and vitals reviewed.   Appointment on 01/04/2019  Component Date Value Ref Range Status  . WBC 01/04/2019 4.9  4.0 - 10.5 K/uL Final  . RBC 01/04/2019 3.90* 4.22 - 5.81 MIL/uL Final  . Hemoglobin 01/04/2019 12.5* 13.0 - 17.0 g/dL Final  . HCT 01/04/2019 37.3* 39.0 - 52.0 % Final  . MCV 01/04/2019 95.6  80.0 - 100.0 fL Final  . MCH 01/04/2019 32.1  26.0 - 34.0 pg Final  . MCHC 01/04/2019 33.5  30.0 - 36.0 g/dL Final  . RDW 01/04/2019 14.3  11.5 - 15.5 % Final  . Platelets 01/04/2019 157  150 - 400 K/uL Final   . nRBC 01/04/2019 0.0  0.0 - 0.2 % Final  . Neutrophils Relative % 01/04/2019 66  % Final  . Neutro Abs 01/04/2019 3.2  1.7 - 7.7 K/uL Final  . Lymphocytes Relative 01/04/2019 26  % Final  .  Lymphs Abs 01/04/2019 1.3  0.7 - 4.0 K/uL Final  . Monocytes Relative 01/04/2019 8  % Final  . Monocytes Absolute 01/04/2019 0.4  0.1 - 1.0 K/uL Final  . Eosinophils Relative 01/04/2019 0  % Final  . Eosinophils Absolute 01/04/2019 0.0  0.0 - 0.5 K/uL Final  . Basophils Relative 01/04/2019 0  % Final  . Basophils Absolute 01/04/2019 0.0  0.0 - 0.1 K/uL Final  . Immature Granulocytes 01/04/2019 0  % Final  . Abs Immature Granulocytes 01/04/2019 0.01  0.00 - 0.07 K/uL Final   Performed at [facility], [address], [place], [place] [address]    Assessment:  Andres Hebert is a 85 y.o. male with metastaticadenocarcinoma of thelungs/p right sided thoracentesis on 10/06/2018.Cytologyconfirmed non-small cell carcinoma, favor adenocarcinoma of the lung.He has a 25-30 pack year smoking history.He has clinical stageT4NxM1b.  PD-L1 testing was 80% on 11/09/2018. Foundation Onetesting revealednumber mutational burden (TMB)18 Muts/Mb, MSI-stable, ATM splice site [id number]>K, CDKN2A loss, CDKN2B loss, DNMT3A Y533C, FGF10 amplification, KRAS G12C, MTAP loss, and RBM10 A421f*216.  Chest CT angiogramon 10/05/2018 revealed a 8.4 x 6.9 cm right lower lobe mass with numerous pleural nodules and a large right pleural effusion. There was bony destruction of the right lateral 7th and 8th ribs. There was no pulmonary embolism.  Head MRIon 10/07/2018 revealed no metastatic disease or acute intracranial abnormality.There was distal left vertebral artery with poor flow or occlusion, most likely due to atherosclerosis.  PET scanon 10/18/2018 revealed a large hypermetabolic RIGHT lower lobe mass consistent with bronchogenic carcinoma. There was multifocal pleural metastasis within the  RIGHT hemithorax. There was one pleural metastatic lesion invading adjacent RIGHT seventh rib, and moderate RIGHT effusion. There was probable RIGHT hilar metastatic adenopathy, and no mediastinal or supraclavicular metastatic adenopathy.   He is s/pthoracentesisx 2 (10/06/2018 and10/05/2018).CT biopsyof the right lower lobelung masson 10/31/2018 was performed for mutational studies.  He is s/p cycle #1 Alimta and pembrolizumabon 11/03/2018.He iss/p cycle #2pembrolizumabalone (11/24/2018 -12/15/2018).  He received the influenza vaccineon 10/13/2018.  Symptomatically, he is doing fair.  Energy level remains low. He is eating "ok"; weight is down 1 pound.  Pain is well controlled.  He has an early grade I midline sacral decubitus ulcer.  Plan: 1.   Labs today: CBC with diff, CMP, Mg, TSH. 2.Stage IV adenocarcinoma of the lung Patient iss/p cycle #1 Alimta and pembrolizumab. Patient is s/p 2 cycles of pembrolizumab alone. Clinically, he is doing fair.                         Pain remains well controlled.                         He denies any hemoptysis. Labs reviewed.  Cycle #3 pembrolizumab today.   Anticipate imaging studies after next treatment. 3. Weight loss Weightis 148 pounds (down 1 pound). He feels that he is eating "ok".  He denies any nausea or vomiting.  Stress importance of caloric intake. 4. Bone metastasis Patienthasright lower ribmetastasis seen on chest CT. No othersites of metastasisonbone scan. Continue to hold Xgeva. 5Cancer-related pain Pain remains well controlled.  Continue Percocet 5-325 1 tablet every 4 hours prn pain (last Rx was on 10/24/2018). 6.Renal insufficiency             Creatinine 0.87 today.             Baseline creatinine 0.69 - 0.87  in past 1 month.  Continue to monitor.                     7.   Decubitus ulcer  Clinically stage I.  Patient applying barrier cream.  Discuss keeping pressure off of bottom.  Continue surveillance. 8.   RTC in 3 weeks for MD assessment, labs (CBC with diff, CMP, Mg, TSH) and cycle # 4 pembrolizumab.  I discussed the assessment and treatment plan with the patient.  The patient was provided an opportunity to ask questions and all were answered.  The patient agreed with the plan and demonstrated an understanding of the instructions.  The patient was advised to call back if the symptoms worsen or if the condition fails to improve as anticipated.  I provided 19 minutes of face-to-face time during this this encounter and > 50% was spent counseling as documented under my assessment and plan.    Lequita Asal, MD, PhD    01/04/2019, 10:31 AM  I, Selena Batten, am acting as scribe for [employer]. Mike Gip, MD, PhD.  I,  C. Mike Gip, MD, have reviewed the above documentation for accuracy and completeness, and I agree with the above.

## 2019-01-03 NOTE — Progress Notes (Signed)
Confirmed Name, DOB, and Address. Assessment completed with assistance of wife. Wife expresses concerns with patient having "no energy".

## 2019-01-04 ENCOUNTER — Inpatient Hospital Stay: Payer: Medicare HMO

## 2019-01-04 ENCOUNTER — Inpatient Hospital Stay: Payer: Medicare HMO | Attending: Hematology and Oncology | Admitting: Hematology and Oncology

## 2019-01-04 ENCOUNTER — Other Ambulatory Visit: Payer: Self-pay

## 2019-01-04 ENCOUNTER — Encounter: Payer: Self-pay | Admitting: Hematology and Oncology

## 2019-01-04 VITALS — BP 116/74 | HR 70 | Temp 96.7°F | Resp 18 | Ht 66.0 in | Wt 148.4 lb

## 2019-01-04 VITALS — BP 123/74 | HR 70 | Temp 96.9°F | Resp 18

## 2019-01-04 DIAGNOSIS — Z8042 Family history of malignant neoplasm of prostate: Secondary | ICD-10-CM | POA: Diagnosis not present

## 2019-01-04 DIAGNOSIS — E119 Type 2 diabetes mellitus without complications: Secondary | ICD-10-CM | POA: Diagnosis not present

## 2019-01-04 DIAGNOSIS — R42 Dizziness and giddiness: Secondary | ICD-10-CM | POA: Insufficient documentation

## 2019-01-04 DIAGNOSIS — C7951 Secondary malignant neoplasm of bone: Secondary | ICD-10-CM | POA: Diagnosis not present

## 2019-01-04 DIAGNOSIS — K5900 Constipation, unspecified: Secondary | ICD-10-CM | POA: Diagnosis not present

## 2019-01-04 DIAGNOSIS — R05 Cough: Secondary | ICD-10-CM | POA: Diagnosis not present

## 2019-01-04 DIAGNOSIS — C3431 Malignant neoplasm of lower lobe, right bronchus or lung: Secondary | ICD-10-CM | POA: Diagnosis present

## 2019-01-04 DIAGNOSIS — R531 Weakness: Secondary | ICD-10-CM | POA: Insufficient documentation

## 2019-01-04 DIAGNOSIS — N289 Disorder of kidney and ureter, unspecified: Secondary | ICD-10-CM | POA: Diagnosis not present

## 2019-01-04 DIAGNOSIS — Z5112 Encounter for antineoplastic immunotherapy: Secondary | ICD-10-CM | POA: Insufficient documentation

## 2019-01-04 DIAGNOSIS — Z79899 Other long term (current) drug therapy: Secondary | ICD-10-CM | POA: Insufficient documentation

## 2019-01-04 DIAGNOSIS — G893 Neoplasm related pain (acute) (chronic): Secondary | ICD-10-CM | POA: Diagnosis not present

## 2019-01-04 DIAGNOSIS — C782 Secondary malignant neoplasm of pleura: Secondary | ICD-10-CM | POA: Insufficient documentation

## 2019-01-04 DIAGNOSIS — R5383 Other fatigue: Secondary | ICD-10-CM | POA: Diagnosis not present

## 2019-01-04 DIAGNOSIS — Z823 Family history of stroke: Secondary | ICD-10-CM | POA: Diagnosis not present

## 2019-01-04 DIAGNOSIS — L89159 Pressure ulcer of sacral region, unspecified stage: Secondary | ICD-10-CM | POA: Insufficient documentation

## 2019-01-04 DIAGNOSIS — M7989 Other specified soft tissue disorders: Secondary | ICD-10-CM | POA: Diagnosis not present

## 2019-01-04 DIAGNOSIS — L89151 Pressure ulcer of sacral region, stage 1: Secondary | ICD-10-CM

## 2019-01-04 DIAGNOSIS — R634 Abnormal weight loss: Secondary | ICD-10-CM

## 2019-01-04 LAB — CBC WITH DIFFERENTIAL/PLATELET
Abs Immature Granulocytes: 0.01 10*3/uL (ref 0.00–0.07)
Basophils Absolute: 0 10*3/uL (ref 0.0–0.1)
Basophils Relative: 0 %
Eosinophils Absolute: 0 10*3/uL (ref 0.0–0.5)
Eosinophils Relative: 0 %
HCT: 37.3 % — ABNORMAL LOW (ref 39.0–52.0)
Hemoglobin: 12.5 g/dL — ABNORMAL LOW (ref 13.0–17.0)
Immature Granulocytes: 0 %
Lymphocytes Relative: 26 %
Lymphs Abs: 1.3 10*3/uL (ref 0.7–4.0)
MCH: 32.1 pg (ref 26.0–34.0)
MCHC: 33.5 g/dL (ref 30.0–36.0)
MCV: 95.6 fL (ref 80.0–100.0)
Monocytes Absolute: 0.4 10*3/uL (ref 0.1–1.0)
Monocytes Relative: 8 %
Neutro Abs: 3.2 10*3/uL (ref 1.7–7.7)
Neutrophils Relative %: 66 %
Platelets: 157 10*3/uL (ref 150–400)
RBC: 3.9 MIL/uL — ABNORMAL LOW (ref 4.22–5.81)
RDW: 14.3 % (ref 11.5–15.5)
WBC: 4.9 10*3/uL (ref 4.0–10.5)
nRBC: 0 % (ref 0.0–0.2)

## 2019-01-04 LAB — COMPREHENSIVE METABOLIC PANEL
ALT: 11 U/L (ref 0–44)
AST: 16 U/L (ref 15–41)
Albumin: 3.6 g/dL (ref 3.5–5.0)
Alkaline Phosphatase: 59 U/L (ref 38–126)
Anion gap: 9 (ref 5–15)
BUN: 16 mg/dL (ref 8–23)
CO2: 29 mmol/L (ref 22–32)
Calcium: 9.3 mg/dL (ref 8.9–10.3)
Chloride: 98 mmol/L (ref 98–111)
Creatinine, Ser: 0.87 mg/dL (ref 0.61–1.24)
GFR calc Af Amer: >60 mL/min (ref >60)
GFR calc non Af Amer: >60 mL/min (ref >60)
Glucose, Bld: 109 mg/dL — ABNORMAL HIGH (ref 70–99)
Potassium: 3.7 mmol/L (ref 3.5–5.1)
Sodium: 136 mmol/L (ref 135–145)
Total Bilirubin: 0.6 mg/dL (ref 0.3–1.2)
Total Protein: 7.5 g/dL (ref 6.5–8.1)

## 2019-01-04 LAB — TSH: TSH: 2.642 u[IU]/mL (ref 0.350–4.500)

## 2019-01-04 LAB — MAGNESIUM: Magnesium: 2.3 mg/dL (ref 1.7–2.4)

## 2019-01-04 MED ORDER — SODIUM CHLORIDE 0.9% FLUSH
10.0000 mL | INTRAVENOUS | Status: DC | PRN
  Administered 2019-01-04: 10 mL
  Filled 2019-01-04: qty 10

## 2019-01-04 MED ORDER — HEPARIN SOD (PORK) LOCK FLUSH 100 UNIT/ML IV SOLN
500.0000 [IU] | Freq: Once | INTRAVENOUS | Status: AC | PRN
  Administered 2019-01-04: 500 [IU]
  Filled 2019-01-04: qty 5

## 2019-01-04 MED ORDER — SODIUM CHLORIDE 0.9 % IV SOLN
200.0000 mg | Freq: Once | INTRAVENOUS | Status: AC
  Administered 2019-01-04: 200 mg via INTRAVENOUS
  Filled 2019-01-04: qty 8

## 2019-01-04 MED ORDER — SODIUM CHLORIDE 0.9 % IV SOLN
Freq: Once | INTRAVENOUS | Status: AC
  Administered 2019-01-04: 11:00:00 via INTRAVENOUS
  Filled 2019-01-04: qty 250

## 2019-01-16 ENCOUNTER — Other Ambulatory Visit: Payer: Medicare HMO

## 2019-01-16 ENCOUNTER — Ambulatory Visit: Payer: Medicare HMO

## 2019-01-16 ENCOUNTER — Ambulatory Visit: Payer: Medicare HMO | Admitting: Hematology and Oncology

## 2019-01-21 DIAGNOSIS — L89151 Pressure ulcer of sacral region, stage 1: Secondary | ICD-10-CM | POA: Insufficient documentation

## 2019-01-23 NOTE — Progress Notes (Signed)
[facility]  [address] [place], [place] [address] Phone: [phone]  Fax: [phone]   Clinic Day:  01/25/2019  Referring physician: [name], MD  Chief Complaint: Andres Hebert is a 85 y.o. male with stage IV adenocarcinoma of therightlung who is seen for 3 week assessment prior to cycle #4 pembrolizumab.    HPI: The patient was last seen in the medical oncology clinic on 01/04/2019. At that time, he was doing fair.  He was eating "ok"; weight was down 1 pound. Pain was well controlled on Percocet 5-325 1 tablet every 4 hours prn. He had an early grade I midline sacral decubitus ulcer. Hematocrit 37.3, hemoglobin 12.5, platelets 157,000, WBC 4,900. Creatinine was 0.87. TSH was 2.642. Magnesium was 2.3. He received cycle #3 pembrolizumab.    During the interim, the patient has felt "real good". He is more active. His appetite is good. He is drinking ensure. He notes constipation, I recommended stool softeners and drinking or eating prunes, peaches, or pineapples.   He has rhinorrhea with clear secretion. His wife note that he lower abdominal pain a few nights ago. He denies any bloody stool or melena. His potassium is 3.3 (low) today. I encouraged him to eat more potassium rich foods and continue to take his potassium chloride 10 meq BID. I advised him to increase his potassium for the next 2 days.  He is here today in clinic without his oxygen tank. He notes trying to test himself and see how well he can breath without it. Thus far, he has not heavily relied on oxygen. He continues to use cream for the sore on his bottom.  His notes his bottom is healing nicely.    Past Medical History:  Diagnosis Date  . BPV (benign positional vertigo)   . Chronic constipation   . Collapsed lung 1959   right  . Diabetes mellitus without complication ([place])   . Hyperlipidemia   . Hypertension     Past Surgical History:  Procedure Laterality Date   . PORTA CATH INSERTION N/A 11/27/2018   Procedure: PORTA CATH INSERTION;  Surgeon: Algernon Huxley, MD;  Location: [facility];  Service: Cardiovascular;  Laterality: N/A;    Family History  Problem Relation Age of Onset  . Stroke Mother   . Prostate cancer Father     Social History:  reports that he quit smoking about 45 years ago. His smoking use included cigarettes. He has a 25.00 pack-year smoking history. He has never used smokeless tobacco. He reports previous alcohol use. He reports that he does not use drugs.  Hehas a 25-30 pack-year smoking history.He stoppedsmoking about 45 years ago.He denies any exposure to asbestos, radiation or toxins.He worked for [employer] and [employer] lives in [place].His wife's name isSheila. The patient is accompanied by [name] via face time today.  Allergies:  Allergies  Allergen Reactions  . Accupril [Quinapril Hcl] Other (See Comments)    Reaction:dizziness per MR    Current Medications: Current Outpatient Medications  Medication Sig Dispense Refill  . acetaminophen (TYLENOL) 325 MG tablet Take 2 tablets (650 mg total) by mouth every 6 (six) hours as needed for mild pain (or Fever >/= 101).    Marland Kitchen amLODipine (NORVASC) 5 MG tablet Take 2.5 mg by mouth daily.     Marland Kitchen aspirin 81 MG chewable tablet Chew 81 mg by mouth daily.    . citalopram (CELEXA) 10 MG tablet Take 1 tablet by mouth daily.    Marland Kitchen  dexamethasone (DECADRON) 4 MG tablet Take 1 tab two times a day the day before Alimta chemo, then take 2 tabs once a day for 3 days starting the day after chemo. (Patient not taking: Reported on 11/14/2018) 30 tablet 1  . feeding supplement, ENSURE ENLIVE, (ENSURE ENLIVE) LIQD Take 237 mLs by mouth 3 (three) times daily between meals. 537 mL 0  . folic acid (FOLVITE) 1 MG tablet Take 1 tablet (1 mg total) by mouth daily. Start 5-7 days before Alimta chemotherapy. Continue until 21 days after Alimta completed. 100 tablet 3  . lidocaine-prilocaine (EMLA)  cream Apply 1 application topically as needed. 30 g 1  . metoprolol tartrate (LOPRESSOR) 25 MG tablet Take 0.5 tablets (12.5 mg total) by mouth 2 (two) times daily. (Patient not taking: Reported on 01/03/2019) 60 tablet 0  . ondansetron (ZOFRAN) 8 MG tablet Take 1 tablet (8 mg total) by mouth 2 (two) times daily as needed (Nausea or vomiting). Start if needed on the third day after chemotherapy. (Patient not taking: Reported on 11/03/2018) 30 tablet 1  . oxyCODONE-acetaminophen (PERCOCET/ROXICET) 5-325 MG tablet Take 1 tablet by mouth every 4 (four) hours as needed for moderate pain. (Patient not taking: Reported on 01/03/2019) 30 tablet 0  . polyethylene glycol (MIRALAX / GLYCOLAX) 17 g packet Take 17 g by mouth 2 (two) times daily. 14 each 0  . potassium chloride (K-DUR,KLOR-CON) 10 MEQ tablet Take 10 mEq by mouth 2 (two) times daily.    . pravastatin (PRAVACHOL) 40 MG tablet Take 40 mg by mouth daily.    Marland Kitchen senna (SENOKOT) 8.6 MG TABS tablet Take 1 tablet (8.6 mg total) by mouth daily as needed for mild constipation. 30 tablet 0  . tamsulosin (FLOMAX) 0.4 MG CAPS capsule Take 1 capsule (0.4 mg total) by mouth daily. (Patient not taking: Reported on 01/03/2019) 30 capsule 0  . traZODone (DESYREL) 50 MG tablet Take 100 mg by mouth.     . triamterene-hydrochlorothiazide (MAXZIDE-25) 37.5-25 MG tablet Take 1 tablet by mouth daily.     No current facility-administered medications for this visit.   Facility-Administered Medications Ordered in Other Visits  Medication Dose Route Frequency Provider Last Rate Last Admin  . heparin lock flush 100 unit/mL  500 Units Intravenous Once Eular Panek C, MD      . sodium chloride flush (NS) 0.9 % injection 10 mL  10 mL Intravenous PRN Lequita Asal, MD        Review of Systems  Constitutional: Negative for chills, diaphoresis, fever, malaise/fatigue and weight loss (up 1 pound).       Feels "real good".  Active. Wife assist him with ADLs.   HENT:  Negative.  Negative for congestion, ear pain, hearing loss, nosebleeds, sinus pain and sore throat.        Clear/white nasal secretions. Rhinorrhea.  Eyes: Negative.  Negative for blurred vision, double vision and photophobia.  Respiratory: Negative for cough (chronic), hemoptysis, sputum production, shortness of breath and wheezing.        No portable oxygen today.  Cardiovascular: Negative.  Negative for chest pain, palpitations, orthopnea, leg swelling and PND.  Gastrointestinal: Positive for constipation. Negative for abdominal pain (transient), blood in stool (slight blood with wiping), diarrhea, heartburn, melena, nausea and vomiting.       Good appetite. Drinking ensure.  Genitourinary: Negative.  Negative for dysuria, flank pain, frequency, hematuria and urgency.  Musculoskeletal: Negative.  Negative for back pain, falls, joint pain, myalgias and neck pain.  Skin: Negative.  Negative for itching.       Skin flakes. Sores on bottom secondary to sitting; using "baby butt cream". Pain from skin break down; healing nicely.  Neurological: Positive for weakness (generalized). Negative for dizziness, tingling, sensory change, speech change, focal weakness, seizures and headaches.  Endo/Heme/Allergies: Negative.  Does not bruise/bleed easily.  Psychiatric/Behavioral: Positive for memory loss. Negative for depression. The patient is not nervous/anxious and does not have insomnia.   All other systems reviewed and are negative.  Performance status (ECOG): 2  Vitals Blood pressure 107/81, pulse 64, temperature (!) 96.5 F (35.8 C), temperature source Tympanic, weight 149 lb 4 oz (67.7 kg), SpO2 93 %.   Physical Exam  Constitutional: He is oriented to person, place, and time. He appears well-developed and well-nourished. No distress.  Patient sitting comfortably in the exam room.  He needs some assistance on and off exam room table.  HENT:  Head: Normocephalic and atraumatic.  Mouth/Throat:  Oropharynx is clear and moist. No oropharyngeal exudate.  Cap and mask.  Gray hair and beard.  Dentures.  Eyes: Pupils are equal, round, and reactive to light. Conjunctivae and EOM are normal. No scleral icterus.  Glasses.  Blue eyes.  Neck: No JVD present.  Cardiovascular: Normal rate and normal heart sounds. Exam reveals no gallop.  No murmur heard. Pulmonary/Chest: Effort normal and breath sounds normal. No respiratory distress. He has no wheezes. He has no rales.  Abdominal: Soft. Bowel sounds are normal. He exhibits no distension and no mass. There is no abdominal tenderness. There is no rebound and no guarding.  Musculoskeletal:        General: No tenderness or edema. Normal range of motion.     Cervical back: Normal range of motion and neck supple.     Comments: Wearing gloves.  Lymphadenopathy:       Head (right side): No preauricular, no posterior auricular and no occipital adenopathy present.       Head (left side): No preauricular, no posterior auricular and no occipital adenopathy present.    He has no cervical adenopathy.    He has no axillary adenopathy.       Right: No inguinal and no supraclavicular adenopathy present.       Left: No inguinal and no supraclavicular adenopathy present.  Neurological: He is alert and oriented to person, place, and time.  Skin: Skin is warm, dry and intact. No bruising and no rash noted. He is not diaphoretic. No erythema. No pallor.  Psychiatric: He has a normal mood and affect. His behavior is normal. Judgment and thought content normal.  Nursing note and vitals reviewed.   Infusion on 01/25/2019  Component Date Value Ref Range Status  . WBC 01/25/2019 4.8  4.0 - 10.5 K/uL Final  . RBC 01/25/2019 4.00* 4.22 - 5.81 MIL/uL Final  . Hemoglobin 01/25/2019 12.7* 13.0 - 17.0 g/dL Final  . HCT 01/25/2019 38.4* 39.0 - 52.0 % Final  . MCV 01/25/2019 96.0  80.0 - 100.0 fL Final  . MCH 01/25/2019 31.8  26.0 - 34.0 pg Final  . MCHC 01/25/2019  33.1  30.0 - 36.0 g/dL Final  . RDW 01/25/2019 13.9  11.5 - 15.5 % Final  . Platelets 01/25/2019 128* 150 - 400 K/uL Final  . nRBC 01/25/2019 0.0  0.0 - 0.2 % Final  . Neutrophils Relative % 01/25/2019 68  % Final  . Neutro Abs 01/25/2019 3.3  1.7 - 7.7 K/uL Final  . Lymphocytes Relative 01/25/2019  23  % Final  . Lymphs Abs 01/25/2019 1.1  0.7 - 4.0 K/uL Final  . Monocytes Relative 01/25/2019 8  % Final  . Monocytes Absolute 01/25/2019 0.4  0.1 - 1.0 K/uL Final  . Eosinophils Relative 01/25/2019 0  % Final  . Eosinophils Absolute 01/25/2019 0.0  0.0 - 0.5 K/uL Final  . Basophils Relative 01/25/2019 0  % Final  . Basophils Absolute 01/25/2019 0.0  0.0 - 0.1 K/uL Final  . Immature Granulocytes 01/25/2019 1  % Final  . Abs Immature Granulocytes 01/25/2019 0.03  0.00 - 0.07 K/uL Final   Performed at [facility], [address], [place],  [address]    Assessment:  Andres Hebert is a 85 y.o. male with metastaticadenocarcinoma of thelungs/p right sided thoracentesis on 10/06/2018.Cytologyconfirmed non-small cell carcinoma, favor adenocarcinoma of the lung.He has a 25-30 pack year smoking history.He has clinical stageT4NxM1b.  PD-L1 testing was 80% on 11/09/2018. Foundation Onetesting revealednumber mutational burden (TMB)18 Muts/Mb, MSI-stable, ATM splice site [id number]>I, CDKN2A loss, CDKN2B loss, DNMT3A Y533C, FGF10 amplification, KRAS G12C, MTAP loss, and RBM10 A439f*216.  Chest CT angiogramon 10/05/2018 revealed a 8.4 x 6.9 cm right lower lobe mass with numerous pleural nodules and a large right pleural effusion. There was bony destruction of the right lateral 7th and 8th ribs. There was no pulmonary embolism.  Head MRIon 10/07/2018 revealed no metastatic disease or acute intracranial abnormality.There was distal left vertebral artery with poor flow or occlusion, most likely due to atherosclerosis.  PET scanon 10/18/2018 revealed a large  hypermetabolic RIGHT lower lobe mass consistent with bronchogenic carcinoma. There was multifocal pleural metastasis within the RIGHT hemithorax. There was one pleural metastatic lesion invading adjacent RIGHT seventh rib, and moderate RIGHT effusion. There was probable RIGHT hilar metastatic adenopathy, and no mediastinal or supraclavicular metastatic adenopathy.   He is s/pthoracentesisx 2 (10/06/2018 and10/05/2018).CT biopsyof the right lower lobelung masson 10/31/2018 was performed for mutational studies.  He is s/p cycle #1 Alimta and pembrolizumabon 11/03/2018.He iss/p 3 cycles ofpembrolizumabalone (11/24/2018 - 01/04/2019).  He received the influenza vaccineon 10/13/2018.  Symptomatically, he is doing well.  He is more active.  He is gaining weight.  Pain is controlled.  Exam is stable.  Plan: 1.   Labs today: CBC with diff, CMP, Mg, TSH. 2.Stage IV adenocarcinoma of the lung Patient iss/p cycle #1 Alimta and pembrolizumab. Patient is s/p 3 cycles of pembrolizumab alone (last 01/04/2019). Clinically, he is doing well Pain is well controlled. He denies any hemoptysis. Labs reviewed.  Cycle #4 pembrolizumab today.              Discuss plans for follow-up chest CT after this cycle.   Patient in agreement. 3. Weight loss Weightis 149 pounds (up 1 pound). He is eating well.             Continue to monitor. 4. Bone metastasis Patienthasright lower ribmetastasis seen on chest CT. No othersites of metastasisonbone scan. Xgeva currently on hold. 5Cancer-related pain Painis well controlled.  Patient remains on Percocet 5-325 1 tablet every 4 hours prn pain (last refilled on 10/24/2018). 6.Renal insufficiency Creatinine 0.89 today. Baseline  creatinine 0.69 - 0.87 in past 1 month.             Continue to monitor. 7. Hypokalemia  Potassium 3.3.  Patient currently taking potassium chloride 10 meq BID.  Increase potassium to TID x 2 days then back to 10 meq BID. 8.   Decubitus ulcer  Clinically stage I.             Patient applying barrier cream.             Patient notes healing well. 9.   Schedule chest CT 02/12/2019. 10.   RTC in 3 weeks for MD assessment, labs (CBC with diff, CMP, Mg, TSH), review of chest CT and cycle #5 pembrolizumab.  I discussed the assessment and treatment plan with the patient.  The patient was provided an opportunity to ask questions and all were answered.  The patient agreed with the plan and demonstrated an understanding of the instructions.  The patient was advised to call back if the symptoms worsen or if the condition fails to improve as anticipated.   Lequita Asal, MD, PhD    01/25/2019, 9:58 AM  I, Selena Batten, am acting as scribe for [employer]. Mike Gip, MD, PhD.  I, Deanie Jupiter C. Mike Gip, MD, have reviewed the above documentation for accuracy and completeness, and I agree with the above.

## 2019-01-25 ENCOUNTER — Inpatient Hospital Stay: Payer: Medicare HMO

## 2019-01-25 ENCOUNTER — Other Ambulatory Visit: Payer: Self-pay

## 2019-01-25 ENCOUNTER — Encounter: Payer: Self-pay | Admitting: Hematology and Oncology

## 2019-01-25 ENCOUNTER — Inpatient Hospital Stay (HOSPITAL_BASED_OUTPATIENT_CLINIC_OR_DEPARTMENT_OTHER): Payer: Medicare HMO | Admitting: Hematology and Oncology

## 2019-01-25 VITALS — BP 107/81 | HR 64 | Temp 96.5°F | Wt 149.3 lb

## 2019-01-25 VITALS — BP 109/62 | HR 55 | Temp 96.2°F | Resp 18

## 2019-01-25 DIAGNOSIS — L89151 Pressure ulcer of sacral region, stage 1: Secondary | ICD-10-CM | POA: Diagnosis not present

## 2019-01-25 DIAGNOSIS — G893 Neoplasm related pain (acute) (chronic): Secondary | ICD-10-CM | POA: Diagnosis not present

## 2019-01-25 DIAGNOSIS — E876 Hypokalemia: Secondary | ICD-10-CM

## 2019-01-25 DIAGNOSIS — Z5112 Encounter for antineoplastic immunotherapy: Secondary | ICD-10-CM | POA: Diagnosis not present

## 2019-01-25 DIAGNOSIS — C7951 Secondary malignant neoplasm of bone: Secondary | ICD-10-CM

## 2019-01-25 DIAGNOSIS — C3431 Malignant neoplasm of lower lobe, right bronchus or lung: Secondary | ICD-10-CM

## 2019-01-25 DIAGNOSIS — Z7189 Other specified counseling: Secondary | ICD-10-CM

## 2019-01-25 DIAGNOSIS — R634 Abnormal weight loss: Secondary | ICD-10-CM

## 2019-01-25 LAB — CBC WITH DIFFERENTIAL/PLATELET
Abs Immature Granulocytes: 0.03 10*3/uL (ref 0.00–0.07)
Basophils Absolute: 0 10*3/uL (ref 0.0–0.1)
Basophils Relative: 0 %
Eosinophils Absolute: 0 10*3/uL (ref 0.0–0.5)
Eosinophils Relative: 0 %
HCT: 38.4 % — ABNORMAL LOW (ref 39.0–52.0)
Hemoglobin: 12.7 g/dL — ABNORMAL LOW (ref 13.0–17.0)
Immature Granulocytes: 1 %
Lymphocytes Relative: 23 %
Lymphs Abs: 1.1 10*3/uL (ref 0.7–4.0)
MCH: 31.8 pg (ref 26.0–34.0)
MCHC: 33.1 g/dL (ref 30.0–36.0)
MCV: 96 fL (ref 80.0–100.0)
Monocytes Absolute: 0.4 10*3/uL (ref 0.1–1.0)
Monocytes Relative: 8 %
Neutro Abs: 3.3 10*3/uL (ref 1.7–7.7)
Neutrophils Relative %: 68 %
Platelets: 128 10*3/uL — ABNORMAL LOW (ref 150–400)
RBC: 4 MIL/uL — ABNORMAL LOW (ref 4.22–5.81)
RDW: 13.9 % (ref 11.5–15.5)
WBC: 4.8 10*3/uL (ref 4.0–10.5)
nRBC: 0 % (ref 0.0–0.2)

## 2019-01-25 LAB — COMPREHENSIVE METABOLIC PANEL
ALT: 13 U/L (ref 0–44)
AST: 20 U/L (ref 15–41)
Albumin: 3.7 g/dL (ref 3.5–5.0)
Alkaline Phosphatase: 56 U/L (ref 38–126)
Anion gap: 9 (ref 5–15)
BUN: 19 mg/dL (ref 8–23)
CO2: 29 mmol/L (ref 22–32)
Calcium: 9.3 mg/dL (ref 8.9–10.3)
Chloride: 98 mmol/L (ref 98–111)
Creatinine, Ser: 0.89 mg/dL (ref 0.61–1.24)
GFR calc Af Amer: >60 mL/min (ref >60)
GFR calc non Af Amer: >60 mL/min (ref >60)
Glucose, Bld: 179 mg/dL — ABNORMAL HIGH (ref 70–99)
Potassium: 3.3 mmol/L — ABNORMAL LOW (ref 3.5–5.1)
Sodium: 136 mmol/L (ref 135–145)
Total Bilirubin: 0.9 mg/dL (ref 0.3–1.2)
Total Protein: 7.3 g/dL (ref 6.5–8.1)

## 2019-01-25 LAB — MAGNESIUM: Magnesium: 2 mg/dL (ref 1.7–2.4)

## 2019-01-25 LAB — TSH: TSH: 2.137 u[IU]/mL (ref 0.350–4.500)

## 2019-01-25 MED ORDER — HEPARIN SOD (PORK) LOCK FLUSH 100 UNIT/ML IV SOLN
500.0000 [IU] | Freq: Once | INTRAVENOUS | Status: AC
  Administered 2019-01-25: 12:00:00 500 [IU] via INTRAVENOUS
  Filled 2019-01-25: qty 5

## 2019-01-25 MED ORDER — SODIUM CHLORIDE 0.9 % IV SOLN
200.0000 mg | Freq: Once | INTRAVENOUS | Status: AC
  Administered 2019-01-25: 11:00:00 200 mg via INTRAVENOUS
  Filled 2019-01-25: qty 8

## 2019-01-25 MED ORDER — HEPARIN SOD (PORK) LOCK FLUSH 100 UNIT/ML IV SOLN
INTRAVENOUS | Status: AC
  Filled 2019-01-25: qty 5

## 2019-01-25 MED ORDER — SODIUM CHLORIDE 0.9% FLUSH
10.0000 mL | INTRAVENOUS | Status: DC | PRN
  Administered 2019-01-25: 10:00:00 10 mL via INTRAVENOUS
  Filled 2019-01-25: qty 10

## 2019-01-25 MED ORDER — SODIUM CHLORIDE 0.9 % IV SOLN
Freq: Once | INTRAVENOUS | Status: AC
  Filled 2019-01-25: qty 250

## 2019-01-25 NOTE — Progress Notes (Signed)
No new changes noted today 

## 2019-02-05 ENCOUNTER — Telehealth: Payer: Self-pay | Admitting: Hematology and Oncology

## 2019-02-05 ENCOUNTER — Inpatient Hospital Stay: Payer: Medicare HMO | Attending: Nurse Practitioner | Admitting: Nurse Practitioner

## 2019-02-05 DIAGNOSIS — E785 Hyperlipidemia, unspecified: Secondary | ICD-10-CM | POA: Insufficient documentation

## 2019-02-05 DIAGNOSIS — T402X5A Adverse effect of other opioids, initial encounter: Secondary | ICD-10-CM

## 2019-02-05 DIAGNOSIS — E876 Hypokalemia: Secondary | ICD-10-CM | POA: Insufficient documentation

## 2019-02-05 DIAGNOSIS — E119 Type 2 diabetes mellitus without complications: Secondary | ICD-10-CM | POA: Insufficient documentation

## 2019-02-05 DIAGNOSIS — I10 Essential (primary) hypertension: Secondary | ICD-10-CM | POA: Insufficient documentation

## 2019-02-05 DIAGNOSIS — C3431 Malignant neoplasm of lower lobe, right bronchus or lung: Secondary | ICD-10-CM

## 2019-02-05 DIAGNOSIS — M25559 Pain in unspecified hip: Secondary | ICD-10-CM | POA: Diagnosis not present

## 2019-02-05 DIAGNOSIS — Z87891 Personal history of nicotine dependence: Secondary | ICD-10-CM | POA: Insufficient documentation

## 2019-02-05 DIAGNOSIS — M549 Dorsalgia, unspecified: Secondary | ICD-10-CM | POA: Insufficient documentation

## 2019-02-05 DIAGNOSIS — G893 Neoplasm related pain (acute) (chronic): Secondary | ICD-10-CM | POA: Diagnosis not present

## 2019-02-05 DIAGNOSIS — Z79899 Other long term (current) drug therapy: Secondary | ICD-10-CM | POA: Insufficient documentation

## 2019-02-05 DIAGNOSIS — Z7952 Long term (current) use of systemic steroids: Secondary | ICD-10-CM | POA: Insufficient documentation

## 2019-02-05 DIAGNOSIS — J90 Pleural effusion, not elsewhere classified: Secondary | ICD-10-CM | POA: Insufficient documentation

## 2019-02-05 DIAGNOSIS — K5903 Drug induced constipation: Secondary | ICD-10-CM

## 2019-02-05 DIAGNOSIS — M545 Low back pain, unspecified: Secondary | ICD-10-CM

## 2019-02-05 DIAGNOSIS — Z8042 Family history of malignant neoplasm of prostate: Secondary | ICD-10-CM | POA: Insufficient documentation

## 2019-02-05 DIAGNOSIS — I714 Abdominal aortic aneurysm, without rupture: Secondary | ICD-10-CM | POA: Insufficient documentation

## 2019-02-05 DIAGNOSIS — C782 Secondary malignant neoplasm of pleura: Secondary | ICD-10-CM | POA: Insufficient documentation

## 2019-02-05 DIAGNOSIS — C7951 Secondary malignant neoplasm of bone: Secondary | ICD-10-CM | POA: Insufficient documentation

## 2019-02-05 DIAGNOSIS — Z823 Family history of stroke: Secondary | ICD-10-CM | POA: Insufficient documentation

## 2019-02-05 DIAGNOSIS — M79643 Pain in unspecified hand: Secondary | ICD-10-CM | POA: Insufficient documentation

## 2019-02-05 DIAGNOSIS — N289 Disorder of kidney and ureter, unspecified: Secondary | ICD-10-CM | POA: Insufficient documentation

## 2019-02-05 NOTE — Telephone Encounter (Signed)
  Let me know if he has a symptom management appointment.  M

## 2019-02-05 NOTE — Telephone Encounter (Signed)
Malachy Mood wife called and said Andres Hebert is having a lot of back pain. She is giving him oxycodone and its not helping too much. He just lays in the bed. Can you refill oxycodone please? [facility] [phone]

## 2019-02-05 NOTE — Progress Notes (Signed)
Virtual Visit Progress Note  Symptom Management [place]  [phone] [phone]  I connected with Andres Hebert on 02/05/19 at  2:30 PM EST by video enabled telemedicine visit and verified that I am speaking with the correct person using two identifiers.   I discussed the limitations, risks, security and privacy concerns of performing an evaluation and management service by telemedicine and the availability of in-person appointments. I also discussed with the patient that there may be a patient responsible charge related to this service. The patient expressed understanding and agreed to proceed.   Other persons participating in the visit and their role in the encounter: none  Patient's location: home Provider's location: home  Chief Complaint: back & groin pain    Patient Care Team: [name], MD as PCP - General (Family Medicine) Lequita Asal, MD as Referring Physician (Hematology and Oncology)   Name of the patient: Andres Hebert  [id number]  [birth year]   Date of visit: 02/05/19  Diagnosis- Lung Cancer  Chief complaint/ Reason for visit- back and groin pain  Heme/Onc history:  Oncology History  Primary adenocarcinoma of lower lobe of right lung ([place])  10/13/2018 Initial Diagnosis   Primary adenocarcinoma of lower lobe of right lung ([place])   11/03/2018 -  Chemotherapy   The patient had palonosetron (ALOXI) injection 0.25 mg, 0.25 mg, Intravenous,  Once, 1 of 1 cycle Administration: 0.25 mg (11/03/2018) PEMEtrexed (ALIMTA) 900 mg in sodium chloride 0.9 % 100 mL chemo infusion, 500 mg/m2 = 900 mg, Intravenous,  Once, 1 of 1 cycle Administration: 900 mg (11/03/2018) pembrolizumab (KEYTRUDA) 200 mg in sodium chloride 0.9 % 50 mL chemo infusion, 200 mg, Intravenous, Once, 5 of 10 cycles Administration: 200 mg (11/03/2018), 200 mg (12/15/2018), 200 mg (01/04/2019), 200 mg (01/25/2019), 200 mg  (11/24/2018) fosaprepitant (EMEND) 150 mg, dexamethasone (DECADRON) 12 mg in sodium chloride 0.9 % 145 mL IVPB, , Intravenous,  Once, 1 of 1 cycle Administration:  (11/03/2018)  for chemotherapy treatment.      Interval history- Andres Hebert, 86 year old male with above history of metastatic lung cancer who presents to symptom management clinic for concerns of low back and groin pain.  His wife Andres Hebert participates in the visit and provides history as well.  He says the pain has been worse over the past 24 hours but that he has pain at baseline.  Localizes to the low back and bilateral groin.  Says this is where the pain always occurs but it is worse.  Pain is worse with laying down or moving.  Pain made somewhat better by standing.  Rates pain 10 of 10 at its worst.  Has been taking Percocet infrequently and says he "just does not like to take pain medicine.  Wife says that he has not taken any today and that has not really complained of pain today.  He most commonly takes a pain pill before bed.  He denies any new lumps or bumps in the groin.  No fevers or chills.  Has chronic constipation.  Takes Dulcolax stool softener and if he goes more than 3 days without a bowel movement he takes a suppository.  Has not tried any other medications. Review of imaging shows history of right lower lobe rib met seen on chest CT.  Xgeva currently on hold. Denies neurologic complaints.  No fever or illness.  No easy bleeding or bruising.  No difficulty urinating.  Appetite is stable.  ECOG FS:2 -  Symptomatic, <50% confined to bed  Review of systems- Review of Systems  Constitutional: Negative for chills, fever, malaise/fatigue and weight loss.  HENT: Negative for hearing loss, nosebleeds, sore throat and tinnitus.   Eyes: Negative for blurred vision and double vision.  Respiratory: Negative for cough, hemoptysis, shortness of breath and wheezing.   Cardiovascular: Negative for chest pain, palpitations and leg  swelling.  Gastrointestinal: Negative for abdominal pain, blood in stool, constipation, diarrhea, melena, nausea and vomiting.  Genitourinary: Negative for dysuria and urgency.  Musculoskeletal: Positive for back pain. Negative for falls, joint pain and myalgias.  Skin: Negative for itching and rash.  Neurological: Negative for dizziness, tingling, sensory change, loss of consciousness, weakness and headaches.  Endo/Heme/Allergies: Negative for environmental allergies. Does not bruise/bleed easily.  Psychiatric/Behavioral: Negative for depression. The patient is not nervous/anxious and does not have insomnia.     Current treatment-pembrolizumab  Allergies  Allergen Reactions  . Accupril [Quinapril Hcl] Other (See Comments)    Reaction:dizziness per MR    Past Medical History:  Diagnosis Date  . BPV (benign positional vertigo)   . Chronic constipation   . Collapsed lung 1959   right  . Diabetes mellitus without complication ([place])   . Hyperlipidemia   . Hypertension     Past Surgical History:  Procedure Laterality Date  . PORTA CATH INSERTION N/A 11/27/2018   Procedure: PORTA CATH INSERTION;  Surgeon: Algernon Huxley, MD;  Location: [facility];  Service: Cardiovascular;  Laterality: N/A;    Social History   Socioeconomic History  . Marital status: Married    Spouse name: Not on file  . Number of children: Not on file  . Years of education: Not on file  . Highest education level: Not on file  Occupational History  . Not on file  Tobacco Use  . Smoking status: Former Smoker    Packs/day: 1.00    Years: 25.00    Pack years: 25.00    Types: Cigarettes    Quit date: 1976    Years since quitting: 45.0  . Smokeless tobacco: Never Used  Substance and Sexual Activity  . Alcohol use: Not Currently  . Drug use: Never  . Sexual activity: Not on file  Other Topics Concern  . Not on file  Social History Narrative  . Not on file   Social Determinants of Health    Financial Resource Strain:   . Difficulty of Paying Living Expenses: Not on file  Food Insecurity:   . Worried About Charity fundraiser in the Last Year: Not on file  . Ran Out of Food in the Last Year: Not on file  Transportation Needs:   . Lack of Transportation (Medical): Not on file  . Lack of Transportation (Non-Medical): Not on file  Physical Activity:   . Days of Exercise per Week: Not on file  . Minutes of Exercise per Session: Not on file  Stress:   . Feeling of Stress : Not on file  Social Connections:   . Frequency of Communication with Friends and Family: Not on file  . Frequency of Social Gatherings with Friends and Family: Not on file  . Attends Religious Services: Not on file  . Active Member of Clubs or Organizations: Not on file  . Attends Archivist Meetings: Not on file  . Marital Status: Not on file  Intimate Partner Violence:   . Fear of Current or Ex-Partner: Not on file  . Emotionally Abused: Not  on file  . Physically Abused: Not on file  . Sexually Abused: Not on file    Family History  Problem Relation Age of Onset  . Stroke Mother   . Prostate cancer Father      Current Outpatient Medications:  .  acetaminophen (TYLENOL) 325 MG tablet, Take 2 tablets (650 mg total) by mouth every 6 (six) hours as needed for mild pain (or Fever >/= 101)., Disp:  , Rfl:  .  amLODipine (NORVASC) 5 MG tablet, Take 2.5 mg by mouth daily. , Disp: , Rfl:  .  aspirin 81 MG chewable tablet, Chew 81 mg by mouth daily., Disp: , Rfl:  .  citalopram (CELEXA) 10 MG tablet, Take 1 tablet by mouth daily., Disp: , Rfl:  .  dexamethasone (DECADRON) 4 MG tablet, Take 1 tab two times a day the day before Alimta chemo, then take 2 tabs once a day for 3 days starting the day after chemo. (Patient not taking: Reported on 11/14/2018), Disp: 30 tablet, Rfl: 1 .  feeding supplement, ENSURE ENLIVE, (ENSURE ENLIVE) LIQD, Take 237 mLs by mouth 3 (three) times daily between meals.,  Disp: 237 mL, Rfl: 0 .  folic acid (FOLVITE) 1 MG tablet, Take 1 tablet (1 mg total) by mouth daily. Start 5-7 days before Alimta chemotherapy. Continue until 21 days after Alimta completed., Disp: 100 tablet, Rfl: 3 .  lidocaine-prilocaine (EMLA) cream, Apply 1 application topically as needed. (Patient not taking: Reported on 01/25/2019), Disp: 30 g, Rfl: 1 .  metoprolol tartrate (LOPRESSOR) 25 MG tablet, Take 0.5 tablets (12.5 mg total) by mouth 2 (two) times daily. (Patient not taking: Reported on 01/03/2019), Disp: 60 tablet, Rfl: 0 .  ondansetron (ZOFRAN) 8 MG tablet, Take 1 tablet (8 mg total) by mouth 2 (two) times daily as needed (Nausea or vomiting). Start if needed on the third day after chemotherapy. (Patient not taking: Reported on 11/03/2018), Disp: 30 tablet, Rfl: 1 .  oxyCODONE-acetaminophen (PERCOCET/ROXICET) 5-325 MG tablet, Take 1 tablet by mouth every 4 (four) hours as needed for moderate pain. (Patient not taking: Reported on 01/03/2019), Disp: 30 tablet, Rfl: 0 .  polyethylene glycol (MIRALAX / GLYCOLAX) 17 g packet, Take 17 g by mouth 2 (two) times daily., Disp: 14 each, Rfl: 0 .  potassium chloride (K-DUR,KLOR-CON) 10 MEQ tablet, Take 10 mEq by mouth 2 (two) times daily., Disp: , Rfl:  .  pravastatin (PRAVACHOL) 40 MG tablet, Take 40 mg by mouth daily., Disp: , Rfl:  .  senna (SENOKOT) 8.6 MG TABS tablet, Take 1 tablet (8.6 mg total) by mouth daily as needed for mild constipation., Disp: 30 tablet, Rfl: 0 .  tamsulosin (FLOMAX) 0.4 MG CAPS capsule, Take 1 capsule (0.4 mg total) by mouth daily. (Patient not taking: Reported on 01/03/2019), Disp: 30 capsule, Rfl: 0 .  traZODone (DESYREL) 50 MG tablet, Take 100 mg by mouth. , Disp: , Rfl:  .  triamterene-hydrochlorothiazide (MAXZIDE-25) 37.5-25 MG tablet, Take 1 tablet by mouth daily., Disp: , Rfl:   Physical exam: Exam limited due to telemedicine  Physical Exam Constitutional:      General: He is not in acute distress.     Comments: Accompanied by wife  Musculoskeletal:     Comments: Ambulating around his home without aids  Neurological:     Mental Status: He is alert.  Psychiatric:        Mood and Affect: Mood normal.        Behavior: Behavior normal.  CMP Latest Ref Rng & Units 01/25/2019  Glucose 70 - 99 mg/dL 179(H)  BUN 8 - 23 mg/dL 19  Creatinine 0.61 - 1.24 mg/dL 0.89  Sodium 135 - 145 mmol/L 136  Potassium 3.5 - 5.1 mmol/L 3.3(L)  Chloride 98 - 111 mmol/L 98  CO2 22 - 32 mmol/L 29  Calcium 8.9 - 10.3 mg/dL 9.3  Total Protein 6.5 - 8.1 g/dL 7.3  Total Bilirubin 0.3 - 1.2 mg/dL 0.9  Alkaline Phos 38 - 126 U/L 56  AST 15 - 41 U/L 20  ALT 0 - 44 U/L 13   CBC Latest Ref Rng & Units 01/25/2019  WBC 4.0 - 10.5 K/uL 4.8  Hemoglobin 13.0 - 17.0 g/dL 12.7(L)  Hematocrit 39.0 - 52.0 % 38.4(L)  Platelets 150 - 400 K/uL 128(L)    No images are attached to the encounter.  No results found.  Assessment and plan- Patient is a 86 y.o. male diagnosed with metastatic adenocarcinoma of the lung currently on palliative pembrolizumab who presents to symptom management clinic for complaints of uncontrolled back and pelvic pain.  1.  Back and pelvic pain-chronic problem that is acutely worse.  Etiology unclear.  Question bone metastasis given history.  Does not appear that he has had Xgeva.  PET from September 2020 showed 1 site of bone metastasis and right seventh rib.  Will get bone scan to evaluate for other sites.  In the interim, encouraged use of pain medication as prescribed.  Refill of Percocet sent today.  2.  Opioid-induced constipation-chronic problem.  Suspect generally constipation is likely secondary to opioid use however, likely low fluid intake, low fiber diet, and limited physical activity also contribute.  Discussed starting MiraLAX 1-2 times a day with senna 1 to 2 tablets once a day with goal of achieving bowel movement without pain or straining every to every other day.  Continue  suppositories if no bowel movement after 48 hours.  Lifestyle modifications encouraged  Disposition: Referral for bone scan Follow-up with Dr. Mike Gip based on results or as scheduled Return to clinic if symptoms do not improve or worsen in the interim   Visit Diagnosis No diagnosis found.  Patient expressed understanding and was in agreement with this plan. He also understands that He can call clinic at any time with any questions, concerns, or complaints.   I discussed the assessment and treatment plan with the patient. The patient was provided an opportunity to ask questions and all were answered. The patient agreed with the plan and demonstrated an understanding of the instructions.   The patient was advised to call back or seek an in-person evaluation if the symptoms worsen or if the condition fails to improve as anticipated.   I provided 25 minutes of face-to-face video visit time during this encounter, and > 50% was spent counseling as documented under my assessment & plan.  Thank you for allowing me to participate in the care of this very pleasant patient.   Beckey Rutter, DNP, AGNP-[facility]: Dr. Mike Gip

## 2019-02-05 NOTE — Telephone Encounter (Signed)
I got him scheduled with [facility] at 230 pm Beckey Rutter NP

## 2019-02-07 ENCOUNTER — Telehealth: Payer: Self-pay | Admitting: Hematology and Oncology

## 2019-02-07 NOTE — Telephone Encounter (Signed)
[name] called and Andres Hebert needs his oxycodone refilled. He has only 2 left. [facility].

## 2019-02-08 ENCOUNTER — Other Ambulatory Visit: Payer: Self-pay | Admitting: Hematology and Oncology

## 2019-02-08 ENCOUNTER — Encounter: Payer: Self-pay | Admitting: Nurse Practitioner

## 2019-02-08 MED ORDER — OXYCODONE-ACETAMINOPHEN 5-325 MG PO TABS: 1.0000 | ORAL_TABLET | ORAL | 0 refills | Status: DC | PRN

## 2019-02-12 ENCOUNTER — Ambulatory Visit
Admission: RE | Admit: 2019-02-12 | Discharge: 2019-02-12 | Disposition: A | Discharge status: Home / Self Care | Payer: Medicare HMO | Source: Ambulatory Visit | Attending: Hematology and Oncology | Admitting: Hematology and Oncology

## 2019-02-12 ENCOUNTER — Other Ambulatory Visit: Payer: Self-pay

## 2019-02-12 DIAGNOSIS — C3431 Malignant neoplasm of lower lobe, right bronchus or lung: Secondary | ICD-10-CM | POA: Diagnosis present

## 2019-02-12 MED ORDER — IOHEXOL 300 MG/ML  SOLN
75.0000 mL | Freq: Once | INTRAMUSCULAR | Status: AC | PRN
  Administered 2019-02-12: 75 mL via INTRAVENOUS

## 2019-02-13 NOTE — Progress Notes (Signed)
[facility]  [address] [place], [place] [address] Phone: [phone]  Fax: [phone]   Clinic Day:  02/15/2019  Referring physician: [name], MD  Chief Complaint: Andres Hebert is a 86 y.o. male with stage IV adenocarcinoma of therightlung who is seen for assessment prior to cycle #5 pembrolizumab.   HPI: The patient was last seen in the medical oncology clinic on 01/25/2019. At that time, he was doing well. He was more active. He was gaining weight. Pain was controlled. Exam was stable. Hematocrit 38.4, hemoglobin 12.7, platelets 128,000, WBC 4,800. Potassium was 3.3. TSH 2.137. We discussed plans for a follow-up chest CT. He received cycle #4 pembrolizumab.   Patient had a virtual visit in the symptoms management clinic with Beckey Rutter, NP on 02/05/2019. He had localized lower back and bilateral groin pain. Pain was worse with laying down or moving. Pain was somewhat better by standing. He rated pain 10 of 10 at its worst. He took pain medication before bed. He had chronic constipation and was on Dulcolax. He was given a refill of Percocet. He was to start MiraLAX 1-2 times a day with senna 1 to 2 tablets once a day. He continued suppositories if no bowel movement after 48 hours.   Chest CT on 02/12/2019 revealed interval improvement in the patient's malignancy. The primary mass was 3.9 x 3.6 cm (previously 5.7 x 7.5 cm).  The right pleural nodularity had improved in size and number of lesions (nodularity abutting right 5th rib was 1.5 x 0.5 cm; previously 2.9 x 1.4 cm). There were sclerotic changes in multiple right-sided ribs c/w treated metastatic disease. There was no right hilar adenopathy.  The right pleural effusion was smaller. There was severe emphysematous changes in the lungs. There was no metastatic disease below the diaphragm.   Bone scan on 02/14/2019 revealed a linear bandlike uptake along the inferior aspect of the L3  vertebral body. This would not be typical for metastatic disease and may be related to compression fracture.  Lumbar spine MRI could be used to further evaluate. There was radiotracer accumulation in the lateral right fifth and seventh ribs c/w known metastatic disease as characterized on previous chest CT and PET-CT. There was no unexpected uptake in the bony pelvis.  Symptomatically, he describes a "bad day".  He is in constant pain (10/10). He has pain from the waist down and originates in his back.  Pain increases with movement. He states he has been crawling on his hands and knees due to the pain. His wife notes his back pain has become worse over the past 3 weeks. For pain, he is taking oxycodone-acetaminophen 5-325 mg TID. His pain reduces from a (10/10) to (7/10).  Pain medications do not make him groggy.  He does not want to take to much pain medication because he does not want to become addicted.  He is also using a pain patch.  I encouraged patient to go to the hospital to be evaluated for possible compression fracture. Patient and wife declined. Patient was interested in receiving his treatment today, obtaining outpatient imaging, and seeing orthopedics in clinic.   He notes trouble getting in and out of his vehicle. He describes insomnia x 1 month related to back pain.  He has constipation. His wife put him on stool softeners, suppositories and when he can not go, Miralax.  I discussed food choices ("p" fruit).    Past Medical History:  Diagnosis Date  .  BPV (benign positional vertigo)   . Chronic constipation   . Collapsed lung 1959   right  . Diabetes mellitus without complication ([place])   . Hyperlipidemia   . Hypertension     Past Surgical History:  Procedure Laterality Date  . PORTA CATH INSERTION N/A 11/27/2018   Procedure: PORTA CATH INSERTION;  Surgeon: Algernon Huxley, MD;  Location: [facility];  Service: Cardiovascular;  Laterality: N/A;    Family History   Problem Relation Age of Onset  . Stroke Mother   . Prostate cancer Father     Social History:  reports that he quit smoking about 45 years ago. His smoking use included cigarettes. He has a 25.00 pack-year smoking history. He has never used smokeless tobacco. He reports previous alcohol use. He reports that he does not use drugs.  Hehas a 25-30 pack-year smoking history.He stoppedsmoking about 45 years ago.He denies any exposure to asbestos, radiation or toxins.He worked for [employer] and [place] lives in [place].His wife's name isSheila. The patient is accompanied by his wife via face time today.  Allergies:  Allergies  Allergen Reactions  . Accupril [Quinapril Hcl] Other (See Comments)    Reaction:dizziness per MR    Current Medications: Current Outpatient Medications  Medication Sig Dispense Refill  . acetaminophen (TYLENOL) 325 MG tablet Take 2 tablets (650 mg total) by mouth every 6 (six) hours as needed for mild pain (or Fever >/= 101).    Marland Kitchen amLODipine (NORVASC) 5 MG tablet Take 2.5 mg by mouth daily.     Marland Kitchen aspirin 81 MG chewable tablet Chew 81 mg by mouth daily.    . feeding supplement, ENSURE ENLIVE, (ENSURE ENLIVE) LIQD Take 237 mLs by mouth 3 (three) times daily between meals. 470 mL 0  . folic acid (FOLVITE) 1 MG tablet Take 1 tablet (1 mg total) by mouth daily. Start 5-7 days before Alimta chemotherapy. Continue until 21 days after Alimta completed. 100 tablet 3  . lidocaine-prilocaine (EMLA) cream Apply 1 application topically as needed. 30 g 1  . oxyCODONE-acetaminophen (PERCOCET/ROXICET) 5-325 MG tablet Take 1 tablet by mouth every 4 (four) hours as needed for moderate pain. 30 tablet 0  . polyethylene glycol (MIRALAX / GLYCOLAX) 17 g packet Take 17 g by mouth 2 (two) times daily. 14 each 0  . potassium chloride (K-DUR,KLOR-CON) 10 MEQ tablet Take 10 mEq by mouth 2 (two) times daily.    . pravastatin (PRAVACHOL) 40 MG tablet Take 40 mg by mouth daily.    Marland Kitchen senna  (SENOKOT) 8.6 MG TABS tablet Take 1 tablet (8.6 mg total) by mouth daily as needed for mild constipation. 30 tablet 0  . traZODone (DESYREL) 50 MG tablet Take 100 mg by mouth.     . triamterene-hydrochlorothiazide (MAXZIDE-25) 37.5-25 MG tablet Take 1 tablet by mouth daily.    . citalopram (CELEXA) 10 MG tablet Take 1 tablet by mouth daily.    Marland Kitchen dexamethasone (DECADRON) 4 MG tablet Take 1 tab two times a day the day before Alimta chemo, then take 2 tabs once a day for 3 days starting the day after chemo. (Patient not taking: Reported on 11/14/2018) 30 tablet 1  . metoprolol tartrate (LOPRESSOR) 25 MG tablet Take 0.5 tablets (12.5 mg total) by mouth 2 (two) times daily. (Patient not taking: Reported on 01/03/2019) 60 tablet 0  . ondansetron (ZOFRAN) 8 MG tablet Take 1 tablet (8 mg total) by mouth 2 (two) times daily as needed (Nausea or vomiting). Start if  needed on the third day after chemotherapy. (Patient not taking: Reported on 11/03/2018) 30 tablet 1  . tamsulosin (FLOMAX) 0.4 MG CAPS capsule Take 1 capsule (0.4 mg total) by mouth daily. (Patient not taking: Reported on 01/03/2019) 30 capsule 0   No current facility-administered medications for this visit.   Facility-Administered Medications Ordered in Other Visits  Medication Dose Route Frequency Provider Last Rate Last Admin  . heparin lock flush 100 unit/mL  500 Units Intravenous Once Jeniya Flannigan C, MD      . sodium chloride flush (NS) 0.9 % injection 10 mL  10 mL Intravenous PRN Lequita Asal, MD   10 mL at 02/15/19 0814    Review of Systems  Constitutional: Negative for chills, diaphoresis, fever, malaise/fatigue and weight loss (no new weight).       Having a bad day.  His wife assist him with ADLs. Unable to sit still in wheelchair for long periods secondary to discomfort/pain.  HENT: Negative.  Negative for congestion, ear pain, hearing loss, nosebleeds, sinus pain and sore throat.        Clear/white nasal secretions.  Rhinorrhea.  Eyes: Negative.  Negative for blurred vision, double vision and photophobia.  Respiratory: Negative for hemoptysis, sputum production, shortness of breath and wheezing. Cough: chronic.   Cardiovascular: Negative.  Negative for chest pain, palpitations, orthopnea, leg swelling and PND.  Gastrointestinal: Positive for constipation (taking stool softeners, suppositories, and Miralax). Negative for abdominal pain, blood in stool, diarrhea, heartburn, melena, nausea and vomiting.       Good appetite. Drinking Ensure.  Genitourinary: Negative.  Negative for dysuria, flank pain, frequency, hematuria and urgency.  Musculoskeletal: Positive for back pain (lumbar spine; increased pain x 3 weeks (10/10); pain increased with movement). Negative for falls, joint pain, myalgias and neck pain.  Skin: Negative.  Negative for itching.       Skin flakes. Sores on bottom secondary to sitting; using "baby butt cream".   Neurological: Positive for weakness (generalized). Negative for dizziness, tingling, sensory change, speech change, focal weakness, seizures and headaches.  Endo/Heme/Allergies: Negative.  Does not bruise/bleed easily.  Psychiatric/Behavioral: Positive for memory loss. Negative for depression. The patient is not nervous/anxious and does not have insomnia.   All other systems reviewed and are negative.  Performance status (ECOG):  2-3  Vitals Blood pressure (!) 91/52, pulse 70, temperature 98.7 F (37.1 C), temperature source Tympanic, resp. rate 18, height 5' 6"  (1.676 m), SpO2 92 %.   Physical Exam  Constitutional: He is oriented to person, place, and time. He appears well-developed and well-nourished. No distress.  He needs assistance on and off exam room table.  He is unable to rest comfortably secondary to back pain.  HENT:  Head: Normocephalic and atraumatic.  Mouth/Throat: Oropharynx is clear and moist. No oropharyngeal exudate.  Cap and mask.  Gray hair and beard.   Dentures.  Eyes: Pupils are equal, round, and reactive to light. Conjunctivae and EOM are normal. No scleral icterus.  Glasses.  Blue eyes.  Neck: No JVD present.  Cardiovascular: Normal rate and normal heart sounds. Exam reveals no gallop.  No murmur heard. Pulmonary/Chest: Effort normal and breath sounds normal. No respiratory distress. He has no wheezes. He has no rales.  Abdominal: Soft. Bowel sounds are normal. He exhibits no distension and no mass. There is no abdominal tenderness. There is no rebound and no guarding.  Musculoskeletal:        General: No edema. Normal range of motion.  Cervical back: Normal range of motion and neck supple.     Comments: Lumbar spine pain on palpation.  Lymphadenopathy:       Head (right side): No preauricular, no posterior auricular and no occipital adenopathy present.       Head (left side): No preauricular, no posterior auricular and no occipital adenopathy present.    He has no cervical adenopathy.    He has no axillary adenopathy.       Right: No inguinal and no supraclavicular adenopathy present.       Left: No inguinal and no supraclavicular adenopathy present.  Neurological: He is alert and oriented to person, place, and time.  Skin: Skin is warm, dry and intact. No bruising and no rash noted. He is not diaphoretic. No erythema. No pallor.  Psychiatric: He has a normal mood and affect. His behavior is normal. Judgment and thought content normal.  Nursing note and vitals reviewed.   Infusion on 02/15/2019  Component Date Value Ref Range Status  . WBC 02/15/2019 6.2  4.0 - 10.5 K/uL Final  . RBC 02/15/2019 4.03* 4.22 - 5.81 MIL/uL Final  . Hemoglobin 02/15/2019 13.1  13.0 - 17.0 g/dL Final  . HCT 02/15/2019 38.6* 39.0 - 52.0 % Final  . MCV 02/15/2019 95.8  80.0 - 100.0 fL Final  . MCH 02/15/2019 32.5  26.0 - 34.0 pg Final  . MCHC 02/15/2019 33.9  30.0 - 36.0 g/dL Final  . RDW 02/15/2019 12.9  11.5 - 15.5 % Final  . Platelets  02/15/2019 149* 150 - 400 K/uL Final  . nRBC 02/15/2019 0.0  0.0 - 0.2 % Final  . Neutrophils Relative % 02/15/2019 79  % Final  . Neutro Abs 02/15/2019 4.8  1.7 - 7.7 K/uL Final  . Lymphocytes Relative 02/15/2019 14  % Final  . Lymphs Abs 02/15/2019 0.9  0.7 - 4.0 K/uL Final  . Monocytes Relative 02/15/2019 7  % Final  . Monocytes Absolute 02/15/2019 0.4  0.1 - 1.0 K/uL Final  . Eosinophils Relative 02/15/2019 0  % Final  . Eosinophils Absolute 02/15/2019 0.0  0.0 - 0.5 K/uL Final  . Basophils Relative 02/15/2019 0  % Final  . Basophils Absolute 02/15/2019 0.0  0.0 - 0.1 K/uL Final  . Immature Granulocytes 02/15/2019 0  % Final  . Abs Immature Granulocytes 02/15/2019 0.02  0.00 - 0.07 K/uL Final   Performed at [facility], [address], [place], [place] [address]    Assessment:  BRAZEN DOMANGUE is a 86 y.o. male with metastaticadenocarcinoma of thelungs/p right sided thoracentesis on 10/06/2018.Cytologyconfirmed non-small cell carcinoma, favor adenocarcinoma of the lung.He has a 25-30 pack year smoking history.He has clinical stageT4NxM1b.  PD-L1 testing was 80% on 11/09/2018. Foundation Onetesting revealednumber mutational burden (TMB)18 Muts/Mb, MSI-stable, ATM splice site 1314-3O>O, CDKN2A loss, CDKN2B loss, DNMT3A Y533C, FGF10 amplification, KRAS G12C, MTAP loss, and RBM10 A483f*216.  Chest CT angiogramon 10/05/2018 revealed a 8.4 x 6.9 cm right lower lobe mass with numerous pleural nodules and a large right pleural effusion. There was bony destruction of the right lateral 7th and 8th ribs. There was no pulmonary embolism.  Head MRIon 10/07/2018 revealed no metastatic disease or acute intracranial abnormality.There was distal left vertebral artery with poor flow or occlusion, most likely due to atherosclerosis.  PET scanon 10/18/2018 revealed a large hypermetabolic RIGHT lower lobe mass consistent with bronchogenic carcinoma. There was  multifocal pleural metastasis within the RIGHT hemithorax. There was one pleural metastatic lesion invading  adjacent RIGHT seventh rib, and moderate RIGHT effusion. There was probable RIGHT hilar metastatic adenopathy, and no mediastinal or supraclavicular metastatic adenopathy.   He is s/pthoracentesisx 2 (10/06/2018 and10/05/2018).CT biopsyof the right lower lobelung masson 10/31/2018 was performed for mutational studies.  He is s/p cycle #1 Alimta and pembrolizumabon 11/03/2018.He iss/p4 cycles ofpembrolizumabalone (11/24/2018- 01/25/2019).  Chest CT on 02/12/2019 revealed interval improvement in the patient's malignancy. The primary mass was 3.9 x 3.6 cm (previously 5.7 x 7.5 cm).  The right pleural nodularity had improved in size and number of lesions (nodularity abutting right 5th rib was 1.5 x 0.5 cm; previously 2.9 x 1.4 cm). There were sclerotic changes in multiple right-sided ribs c/w treated metastatic disease. There was no right hilar adenopathy.  The right pleural effusion was smaller. There was severe emphysematous changes in the lungs. There was no metastatic disease below the diaphragm.   Bone scan on 02/14/2019 revealed a linear bandlike uptake along the inferior aspect of the L3 vertebral body. This would not be typical for metastatic disease and may be related to compression fracture.  Lumbar spine MRI could be used to further evaluate. There was radiotracer accumulation in the lateral right fifth and seventh ribs c/w known metastatic disease as characterized on previous chest CT and PET-CT. There was no unexpected uptake in the bony pelvis.  He received the influenza vaccineon 10/13/2018.  Symptomatically, he has progressive back pain affecting quality of life and ADLs.  Pain localizes to L3-L4 region.  Plan: 1.   Labs today: CBC with diff, CMP, Mg, TSH. 2.Stage IV adenocarcinoma of the lung Patient iss/p cycle #1 Alimta and  pembrolizumab. Patient is s/p4cycles ofpembrolizumab alone (last 01/25/2019). Clinically,he is doing well except for back pain felt related to compression fracture of the lumbar spine.  Chest CT on 02/12/2019 personally reviewed.  Agree with radiology.   Improvement in pimary mass is 3.9 x 3.6 cm (previously 5.7 x 7.5 cm).     Right pleural nodularity had improved in size and number of lesions.    Sclerotic changes in multiple right-sided ribs c/w treated metastatic disease.    No right hilar adenopathy.  Right pleural effusion was smaller.  Bone scan on 02/14/2019 personally reviewed.  Agree with radiology.   Linear bandlike uptake along the inferior aspect of the L3 vertebral body.    Activity in the lateral right fifth and seventh ribs c/w known metastatic disease.  He denies hemoptysis. Labs review.  Cycle #5 pembrolizumab today.   Patient agrees to treatment. 3. Bone metastasis Patienthasright lower ribmetastasis seen on chest CT. Bone scan on 02/14/2019 revealed only lateral right 5th and 7th ribs c/w metastatic disease/  Consider future Xgeva. 4.   Back pain  Etiology felt secondary to compression fracture.  Lumbar spine MRI today.  Discuss referral to orthopedics for kyphoplasty.  Rx:  oxycodone 5 mg 1-2 tablets every 4 hours prn pain (dis: #40).  Patient to keep a pain diary. 5.   Cancer-related pain Painis well controlled.  He is on Percocet 5-325 1 tablet TID. 6.Renal insufficiency Creatinine1/04today. Baseline creatinine 0.69 - 0.87 in past 1 month. Continue to monitor. 7. Hypokalemia             Potassium 3.3.             Patient is currently taking potassium chloride 10 meq BID.             Increase potassium to 10 meq TID x 3 days then back to  10 meq BID. 8.   Constipation  Etiology secondary to  narcotics.  Encourage fluids and "p" fruits.  Patient using stool softeners and laxatives. 9.   STAT lumbar spine MRI. 10.   Consult orthopedics 11.   RTC in 1 week for MD assessment and adjustment of pain medications  Addendum:  Lumbar spine MRI on 02/15/2019 revealed a ild inferior endplate compression fracture of L3 with vertebral body height loss of up to approximately 30% has an appearance most c/w a senile osteoporotic injury. No evidence for metastatic disease.  There was partial visualization of a right pleural effusion. The patient had a right effusion on the prior chest CT.  There was an approximately 3.6 cm abdominal aortic aneurysm (chronic).  I discussed the assessment and treatment plan with the patient.  The patient was provided an opportunity to ask questions and all were answered.  The patient agreed with the plan and demonstrated an understanding of the instructions.  The patient was advised to call back if the symptoms worsen or if the condition fails to improve as anticipated.   Lequita Asal, MD, PhD    02/15/2019, 10:03 AM  I, Selena Batten, am acting as scribe for [employer]. Mike Gip, MD, PhD.  I, Nikie Cid C. Mike Gip, MD, have reviewed the above documentation for accuracy and completeness, and I agree with the above.

## 2019-02-14 ENCOUNTER — Encounter
Admission: RE | Admit: 2019-02-14 | Discharge: 2019-02-14 | Disposition: A | Discharge status: Home / Self Care | Payer: Medicare HMO | Source: Ambulatory Visit | Attending: Nurse Practitioner | Admitting: Nurse Practitioner

## 2019-02-14 ENCOUNTER — Other Ambulatory Visit: Payer: Self-pay

## 2019-02-14 DIAGNOSIS — M545 Low back pain, unspecified: Secondary | ICD-10-CM

## 2019-02-14 DIAGNOSIS — M25559 Pain in unspecified hip: Secondary | ICD-10-CM | POA: Insufficient documentation

## 2019-02-14 DIAGNOSIS — T7840XA Allergy, unspecified, initial encounter: Secondary | ICD-10-CM | POA: Insufficient documentation

## 2019-02-14 DIAGNOSIS — C3431 Malignant neoplasm of lower lobe, right bronchus or lung: Secondary | ICD-10-CM | POA: Diagnosis present

## 2019-02-14 DIAGNOSIS — J9819 Other pulmonary collapse: Secondary | ICD-10-CM | POA: Insufficient documentation

## 2019-02-14 DIAGNOSIS — G43909 Migraine, unspecified, not intractable, without status migrainosus: Secondary | ICD-10-CM | POA: Insufficient documentation

## 2019-02-14 DIAGNOSIS — R17 Unspecified jaundice: Secondary | ICD-10-CM | POA: Insufficient documentation

## 2019-02-14 DIAGNOSIS — I10 Essential (primary) hypertension: Secondary | ICD-10-CM | POA: Insufficient documentation

## 2019-02-14 DIAGNOSIS — E785 Hyperlipidemia, unspecified: Secondary | ICD-10-CM | POA: Insufficient documentation

## 2019-02-14 DIAGNOSIS — K449 Diaphragmatic hernia without obstruction or gangrene: Secondary | ICD-10-CM | POA: Insufficient documentation

## 2019-02-14 MED ORDER — TECHNETIUM TC 99M MEDRONATE IV KIT
20.0000 | PACK | Freq: Once | INTRAVENOUS | Status: AC | PRN
  Administered 2019-02-14: 24.35 via INTRAVENOUS

## 2019-02-15 ENCOUNTER — Telehealth: Payer: Self-pay

## 2019-02-15 ENCOUNTER — Encounter: Payer: Self-pay | Admitting: Hematology and Oncology

## 2019-02-15 ENCOUNTER — Inpatient Hospital Stay: Payer: Medicare HMO

## 2019-02-15 ENCOUNTER — Inpatient Hospital Stay (HOSPITAL_BASED_OUTPATIENT_CLINIC_OR_DEPARTMENT_OTHER): Payer: Medicare HMO | Admitting: Hematology and Oncology

## 2019-02-15 ENCOUNTER — Ambulatory Visit
Admission: RE | Admit: 2019-02-15 | Discharge: 2019-02-15 | Disposition: A | Discharge status: Home / Self Care | Payer: Medicare HMO | Source: Ambulatory Visit | Attending: Hematology and Oncology | Admitting: Hematology and Oncology

## 2019-02-15 ENCOUNTER — Other Ambulatory Visit: Payer: Self-pay

## 2019-02-15 VITALS — BP 91/52 | HR 70 | Temp 98.7°F | Resp 18 | Ht 66.0 in

## 2019-02-15 VITALS — BP 124/68 | HR 59 | Temp 98.4°F | Resp 17

## 2019-02-15 DIAGNOSIS — M545 Low back pain, unspecified: Secondary | ICD-10-CM

## 2019-02-15 DIAGNOSIS — E785 Hyperlipidemia, unspecified: Secondary | ICD-10-CM | POA: Diagnosis not present

## 2019-02-15 DIAGNOSIS — T402X5A Adverse effect of other opioids, initial encounter: Secondary | ICD-10-CM | POA: Diagnosis not present

## 2019-02-15 DIAGNOSIS — Z5112 Encounter for antineoplastic immunotherapy: Secondary | ICD-10-CM

## 2019-02-15 DIAGNOSIS — C7951 Secondary malignant neoplasm of bone: Secondary | ICD-10-CM

## 2019-02-15 DIAGNOSIS — N289 Disorder of kidney and ureter, unspecified: Secondary | ICD-10-CM | POA: Diagnosis not present

## 2019-02-15 DIAGNOSIS — I714 Abdominal aortic aneurysm, without rupture: Secondary | ICD-10-CM | POA: Diagnosis not present

## 2019-02-15 DIAGNOSIS — Z823 Family history of stroke: Secondary | ICD-10-CM | POA: Diagnosis not present

## 2019-02-15 DIAGNOSIS — M79643 Pain in unspecified hand: Secondary | ICD-10-CM | POA: Diagnosis not present

## 2019-02-15 DIAGNOSIS — R634 Abnormal weight loss: Secondary | ICD-10-CM

## 2019-02-15 DIAGNOSIS — C782 Secondary malignant neoplasm of pleura: Secondary | ICD-10-CM | POA: Diagnosis not present

## 2019-02-15 DIAGNOSIS — C3431 Malignant neoplasm of lower lobe, right bronchus or lung: Secondary | ICD-10-CM

## 2019-02-15 DIAGNOSIS — Z87891 Personal history of nicotine dependence: Secondary | ICD-10-CM | POA: Diagnosis not present

## 2019-02-15 DIAGNOSIS — G893 Neoplasm related pain (acute) (chronic): Secondary | ICD-10-CM

## 2019-02-15 DIAGNOSIS — E876 Hypokalemia: Secondary | ICD-10-CM | POA: Diagnosis not present

## 2019-02-15 DIAGNOSIS — J90 Pleural effusion, not elsewhere classified: Secondary | ICD-10-CM | POA: Diagnosis not present

## 2019-02-15 DIAGNOSIS — M549 Dorsalgia, unspecified: Secondary | ICD-10-CM | POA: Diagnosis not present

## 2019-02-15 DIAGNOSIS — E119 Type 2 diabetes mellitus without complications: Secondary | ICD-10-CM | POA: Diagnosis not present

## 2019-02-15 DIAGNOSIS — I10 Essential (primary) hypertension: Secondary | ICD-10-CM | POA: Diagnosis not present

## 2019-02-15 DIAGNOSIS — Z8042 Family history of malignant neoplasm of prostate: Secondary | ICD-10-CM | POA: Diagnosis not present

## 2019-02-15 DIAGNOSIS — K5903 Drug induced constipation: Secondary | ICD-10-CM | POA: Diagnosis not present

## 2019-02-15 DIAGNOSIS — Z7952 Long term (current) use of systemic steroids: Secondary | ICD-10-CM | POA: Diagnosis not present

## 2019-02-15 DIAGNOSIS — Z79899 Other long term (current) drug therapy: Secondary | ICD-10-CM | POA: Diagnosis not present

## 2019-02-15 LAB — COMPREHENSIVE METABOLIC PANEL
ALT: 12 U/L (ref 0–44)
AST: 20 U/L (ref 15–41)
Albumin: 3.7 g/dL (ref 3.5–5.0)
Alkaline Phosphatase: 61 U/L (ref 38–126)
Anion gap: 12 (ref 5–15)
BUN: 15 mg/dL (ref 8–23)
CO2: 25 mmol/L (ref 22–32)
Calcium: 9.4 mg/dL (ref 8.9–10.3)
Chloride: 97 mmol/L — ABNORMAL LOW (ref 98–111)
Creatinine, Ser: 1.04 mg/dL (ref 0.61–1.24)
GFR calc Af Amer: >60 mL/min (ref >60)
GFR calc non Af Amer: >60 mL/min (ref >60)
Glucose, Bld: 208 mg/dL — ABNORMAL HIGH (ref 70–99)
Potassium: 3.3 mmol/L — ABNORMAL LOW (ref 3.5–5.1)
Sodium: 134 mmol/L — ABNORMAL LOW (ref 135–145)
Total Bilirubin: 0.4 mg/dL (ref 0.3–1.2)
Total Protein: 7.4 g/dL (ref 6.5–8.1)

## 2019-02-15 LAB — CBC WITH DIFFERENTIAL/PLATELET
Abs Immature Granulocytes: 0.02 10*3/uL (ref 0.00–0.07)
Basophils Absolute: 0 10*3/uL (ref 0.0–0.1)
Basophils Relative: 0 %
Eosinophils Absolute: 0 10*3/uL (ref 0.0–0.5)
Eosinophils Relative: 0 %
HCT: 38.6 % — ABNORMAL LOW (ref 39.0–52.0)
Hemoglobin: 13.1 g/dL (ref 13.0–17.0)
Immature Granulocytes: 0 %
Lymphocytes Relative: 14 %
Lymphs Abs: 0.9 10*3/uL (ref 0.7–4.0)
MCH: 32.5 pg (ref 26.0–34.0)
MCHC: 33.9 g/dL (ref 30.0–36.0)
MCV: 95.8 fL (ref 80.0–100.0)
Monocytes Absolute: 0.4 10*3/uL (ref 0.1–1.0)
Monocytes Relative: 7 %
Neutro Abs: 4.8 10*3/uL (ref 1.7–7.7)
Neutrophils Relative %: 79 %
Platelets: 149 10*3/uL — ABNORMAL LOW (ref 150–400)
RBC: 4.03 MIL/uL — ABNORMAL LOW (ref 4.22–5.81)
RDW: 12.9 % (ref 11.5–15.5)
WBC: 6.2 10*3/uL (ref 4.0–10.5)
nRBC: 0 % (ref 0.0–0.2)

## 2019-02-15 LAB — MAGNESIUM: Magnesium: 1.9 mg/dL (ref 1.7–2.4)

## 2019-02-15 LAB — TSH: TSH: 2.006 u[IU]/mL (ref 0.350–4.500)

## 2019-02-15 MED ORDER — SODIUM CHLORIDE 0.9 % IV SOLN
200.0000 mg | Freq: Once | INTRAVENOUS | Status: AC
  Administered 2019-02-15: 200 mg via INTRAVENOUS
  Filled 2019-02-15: qty 8

## 2019-02-15 MED ORDER — HEPARIN SOD (PORK) LOCK FLUSH 100 UNIT/ML IV SOLN
INTRAVENOUS | Status: AC
  Filled 2019-02-15: qty 5

## 2019-02-15 MED ORDER — GADOBUTROL 1 MMOL/ML IV SOLN
6.0000 mL | Freq: Once | INTRAVENOUS | Status: AC | PRN
  Administered 2019-02-15: 6 mL via INTRAVENOUS

## 2019-02-15 MED ORDER — SODIUM CHLORIDE 0.9% FLUSH
10.0000 mL | INTRAVENOUS | Status: DC | PRN
  Administered 2019-02-15: 10:00:00 10 mL via INTRAVENOUS
  Filled 2019-02-15: qty 10

## 2019-02-15 MED ORDER — SODIUM CHLORIDE 0.9 % IV SOLN
Freq: Once | INTRAVENOUS | Status: AC
  Filled 2019-02-15: qty 250

## 2019-02-15 MED ORDER — OXYCODONE HCL 5 MG PO TABS: ORAL_TABLET | ORAL | 0 refills | Status: DC

## 2019-02-15 MED ORDER — HEPARIN SOD (PORK) LOCK FLUSH 100 UNIT/ML IV SOLN
500.0000 [IU] | Freq: Once | INTRAVENOUS | Status: AC
  Administered 2019-02-15: 12:00:00 500 [IU] via INTRAVENOUS
  Filled 2019-02-15: qty 5

## 2019-02-15 NOTE — Telephone Encounter (Signed)
New patient referral faxed over to Dr Hessie Knows [phone]). Fax confirmation confirmed.

## 2019-02-15 NOTE — Patient Instructions (Signed)
STOP taking Dexamethasone (Decadron) and Folic Acid. These medications are not longer needed.   INCREASE Potassium 10 MEQ to 1 tablet 3 time per day for 3 total days. After he finished taking it for three days, he can decrease back to his normal dose (1 tablet twice a day).  Please call us at [phone] if you have any further questions.  Thanks!

## 2019-02-15 NOTE — Progress Notes (Signed)
Patient states his back pain is" terrible", rates it 10/10 on pain scale. Can't sit still due to the pain. States he did not eat breakfast and has not been eating due to the pain.

## 2019-02-15 NOTE — Progress Notes (Signed)
The patient c/o having pain from his waist down. With moving any kind of way. Pain level ( 10). Constant pain, the patient states he has been crawling on his hands and knees due to the pain. The patient is moaning while sitting in the wheel chair due to pain.

## 2019-02-16 ENCOUNTER — Ambulatory Visit: Payer: Medicare HMO

## 2019-02-16 ENCOUNTER — Other Ambulatory Visit: Payer: Self-pay | Admitting: Orthopedic Surgery

## 2019-02-16 ENCOUNTER — Other Ambulatory Visit
Admission: RE | Admit: 2019-02-16 | Discharge: 2019-02-16 | Disposition: A | Discharge status: Home / Self Care | Payer: Medicare HMO | Source: Ambulatory Visit | Attending: Orthopedic Surgery | Admitting: Orthopedic Surgery

## 2019-02-16 DIAGNOSIS — Z01812 Encounter for preprocedural laboratory examination: Secondary | ICD-10-CM | POA: Insufficient documentation

## 2019-02-16 DIAGNOSIS — Z20822 Contact with and (suspected) exposure to covid-19: Secondary | ICD-10-CM | POA: Diagnosis not present

## 2019-02-17 DIAGNOSIS — M545 Low back pain, unspecified: Secondary | ICD-10-CM | POA: Insufficient documentation

## 2019-02-17 LAB — SARS CORONAVIRUS 2 (TAT 6-24 HRS): SARS Coronavirus 2: NEGATIVE

## 2019-02-19 ENCOUNTER — Other Ambulatory Visit: Payer: Self-pay

## 2019-02-19 ENCOUNTER — Other Ambulatory Visit
Admission: RE | Admit: 2019-02-19 | Discharge: 2019-02-19 | Disposition: A | Discharge status: Home / Self Care | Payer: Medicare HMO | Source: Ambulatory Visit | Attending: Orthopedic Surgery | Admitting: Orthopedic Surgery

## 2019-02-19 HISTORY — DX: Personal history of other diseases of the digestive system: Z87.19

## 2019-02-19 HISTORY — DX: Malignant (primary) neoplasm, unspecified: C80.1

## 2019-02-19 MED ORDER — CEFAZOLIN SODIUM-DEXTROSE 2-4 GM/100ML-% IV SOLN
2.0000 g | INTRAVENOUS | Status: AC
  Administered 2019-02-20: 2 g via INTRAVENOUS

## 2019-02-19 NOTE — Patient Instructions (Addendum)
Your procedure is scheduled on: Tuesday, January 26 Report to Day Surgery on the 2nd floor of the [facility]. To find out your arrival time, please call [phone] between 1PM - 3PM on: Monday, January 25  REMEMBER: Instructions that are not followed completely may result in serious medical risk, up to and including death; or upon the discretion of your surgeon and anesthesiologist your surgery may need to be rescheduled.  Do not eat food after midnight the night before surgery.  No gum chewing, lozengers or hard candies.  You may however, drink CLEAR liquids up to 2 hours before you are scheduled to arrive for your surgery. Do not drink anything within 2 hours of the start of your surgery.  Clear liquids include: - water  - apple juice without pulp - gatorade - black coffee or tea (Do NOT add milk or creamers to the coffee or tea) Do NOT drink anything that is not on this list.  ENSURE PRE-SURGERY CARBOHYDRATE DRINK:  Complete drinking 3 hours prior to surgery.  No Alcohol for 24 hours before or after surgery.  No Smoking including e-cigarettes for 24 hours prior to surgery.  No chewable tobacco products for at least 6 hours prior to surgery.  No nicotine patches on the day of surgery.  On the morning of surgery brush your teeth with toothpaste and water, you may rinse your mouth with mouthwash if you wish. Do not swallow any toothpaste or mouthwash.  Notify your doctor if there is any change in your medical condition (cold, fever, infection).  Do not wear jewelry, make-up, hairpins, clips or nail polish.  Do not wear lotions, powders, or perfumes.   Do not shave 48 hours prior to surgery.   Contacts and dentures may not be worn into surgery.  Do not bring valuables to the hospital, including drivers license, insurance or credit cards.  [facility] is not responsible for any belongings or valuables.   TAKE THESE MEDICATIONS THE MORNING OF SURGERY:  1.   Amlodipine 2.  Citalopram 3.  Oxycodone (if needed for pain) 4.  Potassium  Use antibacterial soap the night before surgery and the morning of surgery.  Stop aspirin and Anti-inflammatories (NSAIDS) such as Advil, Aleve, Ibuprofen, Motrin, Naproxen, Naprosyn and Aspirin based products such as Excedrin, Goodys Powder, BC Powder. (May take Tylenol or Acetaminophen if needed.)  Stop ANY OVER THE COUNTER supplements until after surgery. (May continue Vitamin D, Vitamin B, and multivitamin.)  Wear comfortable clothing (specific to your surgery type) to the hospital.  If you are being discharged the day of surgery, you will not be allowed to drive home. You will need a responsible adult to drive you home and stay with you that night.   If you are taking public transportation, you will need to have a responsible adult with you. Please confirm with your physician that it is acceptable to use public transportation.   Please call [phone] if you have any questions about these instructions.

## 2019-02-20 ENCOUNTER — Ambulatory Visit: Payer: Medicare HMO

## 2019-02-20 ENCOUNTER — Encounter
Admission: RE | Disposition: A | Discharge status: Home / Self Care | Payer: Self-pay | Source: Home / Self Care | Attending: Orthopedic Surgery

## 2019-02-20 ENCOUNTER — Ambulatory Visit: Payer: Medicare HMO | Admitting: Anesthesiology

## 2019-02-20 ENCOUNTER — Encounter: Payer: Self-pay | Admitting: Orthopedic Surgery

## 2019-02-20 ENCOUNTER — Ambulatory Visit
Admission: RE | Admit: 2019-02-20 | Discharge: 2019-02-20 | Disposition: A | Discharge status: Home / Self Care | Payer: Medicare HMO | Attending: Orthopedic Surgery | Admitting: Orthopedic Surgery

## 2019-02-20 DIAGNOSIS — E785 Hyperlipidemia, unspecified: Secondary | ICD-10-CM | POA: Insufficient documentation

## 2019-02-20 DIAGNOSIS — C7951 Secondary malignant neoplasm of bone: Secondary | ICD-10-CM | POA: Diagnosis not present

## 2019-02-20 DIAGNOSIS — Z87891 Personal history of nicotine dependence: Secondary | ICD-10-CM | POA: Diagnosis not present

## 2019-02-20 DIAGNOSIS — Z7982 Long term (current) use of aspirin: Secondary | ICD-10-CM | POA: Insufficient documentation

## 2019-02-20 DIAGNOSIS — S32030A Wedge compression fracture of third lumbar vertebra, initial encounter for closed fracture: Secondary | ICD-10-CM | POA: Diagnosis present

## 2019-02-20 DIAGNOSIS — Z888 Allergy status to other drugs, medicaments and biological substances status: Secondary | ICD-10-CM | POA: Diagnosis not present

## 2019-02-20 DIAGNOSIS — X58XXXA Exposure to other specified factors, initial encounter: Secondary | ICD-10-CM | POA: Diagnosis not present

## 2019-02-20 DIAGNOSIS — H8110 Benign paroxysmal vertigo, unspecified ear: Secondary | ICD-10-CM | POA: Insufficient documentation

## 2019-02-20 DIAGNOSIS — Z803 Family history of malignant neoplasm of breast: Secondary | ICD-10-CM | POA: Diagnosis not present

## 2019-02-20 DIAGNOSIS — R519 Headache, unspecified: Secondary | ICD-10-CM | POA: Diagnosis not present

## 2019-02-20 DIAGNOSIS — Z820 Family history of epilepsy and other diseases of the nervous system: Secondary | ICD-10-CM | POA: Insufficient documentation

## 2019-02-20 DIAGNOSIS — Z8249 Family history of ischemic heart disease and other diseases of the circulatory system: Secondary | ICD-10-CM | POA: Insufficient documentation

## 2019-02-20 DIAGNOSIS — Z9981 Dependence on supplemental oxygen: Secondary | ICD-10-CM | POA: Insufficient documentation

## 2019-02-20 DIAGNOSIS — K449 Diaphragmatic hernia without obstruction or gangrene: Secondary | ICD-10-CM | POA: Insufficient documentation

## 2019-02-20 DIAGNOSIS — Y939 Activity, unspecified: Secondary | ICD-10-CM | POA: Insufficient documentation

## 2019-02-20 DIAGNOSIS — Z823 Family history of stroke: Secondary | ICD-10-CM | POA: Diagnosis not present

## 2019-02-20 DIAGNOSIS — K219 Gastro-esophageal reflux disease without esophagitis: Secondary | ICD-10-CM | POA: Diagnosis not present

## 2019-02-20 DIAGNOSIS — C3490 Malignant neoplasm of unspecified part of unspecified bronchus or lung: Secondary | ICD-10-CM | POA: Insufficient documentation

## 2019-02-20 DIAGNOSIS — Z79899 Other long term (current) drug therapy: Secondary | ICD-10-CM | POA: Diagnosis not present

## 2019-02-20 DIAGNOSIS — J449 Chronic obstructive pulmonary disease, unspecified: Secondary | ICD-10-CM | POA: Diagnosis not present

## 2019-02-20 DIAGNOSIS — Z9841 Cataract extraction status, right eye: Secondary | ICD-10-CM | POA: Diagnosis not present

## 2019-02-20 DIAGNOSIS — I10 Essential (primary) hypertension: Secondary | ICD-10-CM | POA: Diagnosis not present

## 2019-02-20 DIAGNOSIS — Z9842 Cataract extraction status, left eye: Secondary | ICD-10-CM | POA: Diagnosis not present

## 2019-02-20 DIAGNOSIS — E118 Type 2 diabetes mellitus with unspecified complications: Secondary | ICD-10-CM | POA: Diagnosis not present

## 2019-02-20 DIAGNOSIS — Z419 Encounter for procedure for purposes other than remedying health state, unspecified: Secondary | ICD-10-CM

## 2019-02-20 HISTORY — PX: KYPHOPLASTY: SHX5884

## 2019-02-20 LAB — POCT I-STAT, CHEM 8
BUN: 21 mg/dL (ref 8–23)
Calcium, Ion: 1.21 mmol/L (ref 1.15–1.40)
Chloride: 97 mmol/L — ABNORMAL LOW (ref 98–111)
Creatinine, Ser: 1.1 mg/dL (ref 0.61–1.24)
Glucose, Bld: 151 mg/dL — ABNORMAL HIGH (ref 70–99)
HCT: 43 % (ref 39.0–52.0)
Hemoglobin: 14.6 g/dL (ref 13.0–17.0)
Potassium: 3.7 mmol/L (ref 3.5–5.1)
Sodium: 136 mmol/L (ref 135–145)
TCO2: 27 mmol/L (ref 22–32)

## 2019-02-20 LAB — GLUCOSE, CAPILLARY
Glucose-Capillary: 149 mg/dL — ABNORMAL HIGH (ref 70–99)
Glucose-Capillary: 135 mg/dL — ABNORMAL HIGH (ref 70–99)

## 2019-02-20 SURGERY — KYPHOPLASTY
Anesthesia: General

## 2019-02-20 MED ORDER — ONDANSETRON HCL 4 MG PO TABS: 4.0000 mg | ORAL_TABLET | Freq: Four times a day (QID) | ORAL | Status: DC | PRN

## 2019-02-20 MED ORDER — SODIUM CHLORIDE 0.9 % IV SOLN: INTRAVENOUS | Status: DC

## 2019-02-20 MED ORDER — OXYCODONE HCL 5 MG PO TABS: 5.0000 mg | ORAL_TABLET | Freq: Once | ORAL | Status: DC | PRN

## 2019-02-20 MED ORDER — OXYCODONE HCL 5 MG/5ML PO SOLN: 5.0000 mg | Freq: Once | ORAL | Status: DC | PRN

## 2019-02-20 MED ORDER — IOHEXOL 180 MG/ML  SOLN
INTRAMUSCULAR | Status: DC | PRN
  Administered 2019-02-20: 40 mL

## 2019-02-20 MED ORDER — DIPHENHYDRAMINE HCL 25 MG PO CAPS
ORAL_CAPSULE | ORAL | Status: AC
  Filled 2019-02-20: qty 2

## 2019-02-20 MED ORDER — FAMOTIDINE 20 MG PO TABS: 20.0000 mg | ORAL_TABLET | Freq: Once | ORAL | Status: DC

## 2019-02-20 MED ORDER — FENTANYL CITRATE (PF) 100 MCG/2ML IJ SOLN
INTRAMUSCULAR | Status: AC
  Filled 2019-02-20: qty 2

## 2019-02-20 MED ORDER — CEFAZOLIN SODIUM-DEXTROSE 2-4 GM/100ML-% IV SOLN
INTRAVENOUS | Status: AC
  Filled 2019-02-20: qty 100

## 2019-02-20 MED ORDER — FENTANYL CITRATE (PF) 100 MCG/2ML IJ SOLN
INTRAMUSCULAR | Status: DC | PRN
  Administered 2019-02-20 (×4): 25 ug via INTRAVENOUS

## 2019-02-20 MED ORDER — METOCLOPRAMIDE HCL 5 MG/ML IJ SOLN: 5.0000 mg | Freq: Three times a day (TID) | INTRAMUSCULAR | Status: DC | PRN

## 2019-02-20 MED ORDER — METOCLOPRAMIDE HCL 10 MG PO TABS: 5.0000 mg | ORAL_TABLET | Freq: Three times a day (TID) | ORAL | Status: DC | PRN

## 2019-02-20 MED ORDER — CHLORHEXIDINE GLUCONATE 4 % EX LIQD
60.0000 mL | Freq: Once | CUTANEOUS | Status: AC
  Administered 2019-02-20: 4 via TOPICAL

## 2019-02-20 MED ORDER — ONDANSETRON HCL 4 MG/2ML IJ SOLN: 4.0000 mg | Freq: Four times a day (QID) | INTRAMUSCULAR | Status: DC | PRN

## 2019-02-20 MED ORDER — MIDAZOLAM HCL 2 MG/2ML IJ SOLN
INTRAMUSCULAR | Status: DC | PRN
  Administered 2019-02-20: 1 mg via INTRAVENOUS

## 2019-02-20 MED ORDER — LIDOCAINE HCL 1 % IJ SOLN
INTRAMUSCULAR | Status: DC | PRN
  Administered 2019-02-20: 20 mL
  Administered 2019-02-20: 10 mL

## 2019-02-20 MED ORDER — MIDAZOLAM HCL 2 MG/2ML IJ SOLN
INTRAMUSCULAR | Status: AC
  Filled 2019-02-20: qty 2

## 2019-02-20 MED ORDER — FENTANYL CITRATE (PF) 100 MCG/2ML IJ SOLN: 25.0000 ug | INTRAMUSCULAR | Status: DC | PRN

## 2019-02-20 MED ORDER — PROPOFOL 10 MG/ML IV BOLUS
INTRAVENOUS | Status: DC | PRN
  Administered 2019-02-20: 30 mg via INTRAVENOUS

## 2019-02-20 MED ORDER — PROPOFOL 500 MG/50ML IV EMUL
INTRAVENOUS | Status: DC | PRN
  Administered 2019-02-20: 45 ug/kg/min via INTRAVENOUS

## 2019-02-20 MED ORDER — BUPIVACAINE-EPINEPHRINE (PF) 0.5% -1:200000 IJ SOLN
INTRAMUSCULAR | Status: DC | PRN
  Administered 2019-02-20: 20 mL via PERINEURAL

## 2019-02-20 SURGICAL SUPPLY — 18 items
CEMENT KYPHON CX01A KIT/MIXER (Cement) ×2 IMPLANT
COVER WAND RF STERILE (DRAPES) ×2 IMPLANT
DERMABOND ADVANCED (GAUZE/BANDAGES/DRESSINGS) ×1
DERMABOND ADVANCED .7 DNX12 (GAUZE/BANDAGES/DRESSINGS) ×1 IMPLANT
DEVICE BIOPSY BONE KYPHX (INSTRUMENTS) ×2 IMPLANT
DRAPE C-ARM XRAY 36X54 (DRAPES) ×2 IMPLANT
DURAPREP 26ML APPLICATOR (WOUND CARE) ×2 IMPLANT
GLOVE SURG SYN 9.0  PF PI (GLOVE) ×1
GLOVE SURG SYN 9.0 PF PI (GLOVE) ×1 IMPLANT
GOWN SRG 2XL LVL 4 RGLN SLV (GOWNS) ×1 IMPLANT
GOWN STRL NON-REIN 2XL LVL4 (GOWNS) ×1
GOWN STRL REUS W/ TWL LRG LVL3 (GOWN DISPOSABLE) ×1 IMPLANT
GOWN STRL REUS W/TWL LRG LVL3 (GOWN DISPOSABLE) ×1
PACK KYPHOPLASTY (MISCELLANEOUS) ×2 IMPLANT
RENTAL RFA GENERATOR (MISCELLANEOUS) IMPLANT
STRAP SAFETY 5IN WIDE (MISCELLANEOUS) ×2 IMPLANT
TRAY KYPHOPAK 15/3 EXPRESS 1ST (MISCELLANEOUS) IMPLANT
TRAY KYPHOPAK 20/3 EXPRESS 1ST (MISCELLANEOUS) ×2 IMPLANT

## 2019-02-20 NOTE — Discharge Instructions (Signed)
Remove Band-Aids on Thursday then okay to shower. Minimize activities through the weekend. Pain medicine as previously directed. Call office if you are having problems.  AMBULATORY SURGERY  DISCHARGE INSTRUCTIONS   1) The drugs that you were given will stay in your system until tomorrow so for the next 24 hours you should not:  A) Drive an automobile B) Make any legal decisions C) Drink any alcoholic beverage   2) You may resume regular meals tomorrow.  Today it is better to start with liquids and gradually work up to solid foods.  You may eat anything you prefer, but it is better to start with liquids, then soup and crackers, and gradually work up to solid foods.   3) Please notify your doctor immediately if you have any unusual bleeding, trouble breathing, redness and pain at the surgery site, drainage, fever, or pain not relieved by medication.    4) Additional Instructions: TAKE A STOOL SOFTENER TWICE A DAY WHILE TAKING NARCOTIC PAIN MEDICINE TO PREVENT CONSTIPATION   Please contact your physician with any problems or Same Day Surgery at [phone], Monday through Friday 6 am to 4 pm, or [place] at [facility] number at [phone].

## 2019-02-20 NOTE — Anesthesia Preprocedure Evaluation (Signed)
Anesthesia Evaluation  Patient identified by MRN, date of birth, ID band Patient awake    Reviewed: Allergy & Precautions, H&P , NPO status , Patient's Chart, lab work & pertinent test results  History of Anesthesia Complications Negative for: history of anesthetic complications  Airway Mallampati: III  TM Distance: <3 FB Neck ROM: limited    Dental  (+) Edentulous Lower, Edentulous Upper   Pulmonary shortness of breath and with exertion, COPD,  oxygen dependent, former smoker,           Cardiovascular hypertension, (-) angina     Neuro/Psych  Headaches, negative psych ROS   GI/Hepatic Neg liver ROS, hiatal hernia, GERD  Medicated and Controlled,  Endo/Other  diabetes, Type 2  Renal/GU negative Renal ROS  negative genitourinary   Musculoskeletal   Abdominal   Peds  Hematology negative hematology ROS (+)   Anesthesia Other Findings Past Medical History: No date: BPV (benign positional vertigo) No date: Cancer (HCC)     Comment:  lung cancer stage IV No date: Chronic constipation 1959: Collapsed lung     Comment:  right No date: Diabetes mellitus without complication (HCC)     Comment:  borderline No date: History of hiatal hernia No date: Hyperlipidemia No date: Hypertension  Past Surgical History: No date: CATARACT EXTRACTION, BILATERAL No date: COLONOSCOPY No date: ESOPHAGOGASTRODUODENOSCOPY No date: EYE SURGERY 11/27/2018: PORTA CATH INSERTION; N/A     Comment:  Procedure: PORTA CATH INSERTION;  Surgeon: Algernon Huxley,              MD;  Location: [facility];  Service:               Cardiovascular;  Laterality: N/A;  BMI    Body Mass Index: 23.89 kg/m      Reproductive/Obstetrics negative OB ROS                             Anesthesia Physical Anesthesia Plan  ASA: IV  Anesthesia Plan: General   Post-op Pain Management:    Induction: Intravenous  PONV  Risk Score and Plan: Propofol infusion and TIVA  Airway Management Planned: Natural Airway and Nasal Cannula  Additional Equipment:   Intra-op Plan:   Post-operative Plan:   Informed Consent: I have reviewed the patients History and Physical, chart, labs and discussed the procedure including the risks, benefits and alternatives for the proposed anesthesia with the patient or authorized representative who has indicated his/her understanding and acceptance.     Dental Advisory Given  Plan Discussed with: Anesthesiologist, CRNA and Surgeon  Anesthesia Plan Comments: (Patient consented for risks of anesthesia including but not limited to:  - adverse reactions to medications - risk of intubation if required - damage to teeth, lips or other oral mucosa - sore throat or hoarseness - Damage to heart, brain, lungs or loss of life  Patient voiced understanding.)        Anesthesia Quick Evaluation

## 2019-02-20 NOTE — H&P (Signed)
Reviewed paper H+P, will be scanned into chart. No changes noted.  

## 2019-02-20 NOTE — Transfer of Care (Signed)
Immediate Anesthesia Transfer of Care Note  Patient: Andres Hebert  Procedure(s) Performed: L3 KYPHOPLASTY (N/A )  Patient Location: PACU  Anesthesia Type:General  Level of Consciousness: sedated  Airway & Oxygen Therapy: Patient Spontanous Breathing and Patient connected to nasal cannula oxygen  Post-op Assessment: Report given to RN and Post -op Vital signs reviewed and stable  Post vital signs: Reviewed and stable  Last Vitals:  Vitals Value Taken Time  BP 108/55 02/20/19 1504  Temp 36.1 C 02/20/19 1504  Pulse 80 02/20/19 1505  Resp 11 02/20/19 1505  SpO2 97 % 02/20/19 1505  Vitals shown include unvalidated device data.  Last Pain:  Vitals:   02/20/19 1504  TempSrc:   PainSc: Asleep         Complications: No apparent anesthesia complications

## 2019-02-20 NOTE — Op Note (Signed)
Date February 20, 2019  time 3:06 PM   PATIENT:  Andres Hebert   PRE-OPERATIVE DIAGNOSIS:  closed wedge compression fracture of L3   POST-OPERATIVE DIAGNOSIS:  closed wedge compression fracture of L3   PROCEDURE:  Procedure(s): KYPHOPLASTY L3  SURGEON: Laurene Footman, MD   ASSISTANTS: None   ANESTHESIA:   local and MAC   EBL:  No intake/output data recorded.   BLOOD ADMINISTERED:none   DRAINS: none    LOCAL MEDICATIONS USED:  MARCAINE    and XYLOCAINE    SPECIMEN:   L3 vertebral body   DISPOSITION OF SPECIMEN:  Pathology   COUNTS:  YES   TOURNIQUET:  * No tourniquets in log *   IMPLANTS: Bone cement   DICTATION: .Dragon Dictation  patient was brought to the operating room and after adequate anesthesia was obtained the patient was placed prone.  C arm was brought in in good visualization of the affected level obtained on both AP and lateral projections.  After patient identification and timeout procedures were completed, local anesthetic was infiltrated with 10 cc 1% Xylocaine infiltrated subcutaneously.  This is done the area on the each side of the planned approach.  The back was then prepped and draped in the usual sterile manner and repeat timeout procedure carried out.  A spinal needle was brought down to the pedicle on the each side of  L3 and a 50-50 mix of 1% Xylocaine half percent Sensorcaine with epinephrine total of 20 cc injected.  After allowing this to set a small incision was made and the trocar was advanced into the vertebral body in an extrapedicular fashion.  Biopsy was obtained Drilling was carried out balloon inserted with inflation to  total of 6 cc.  When the cement was appropriate consistency 7-1/2 cc were injected into the vertebral body without extravasation, good fill superior to inferior endplates and from right to left sides along the inferior endplate.  After the cement had set the trochar was removed and permanent C-arm views obtained.  The wound was  closed with Dermabond followed by Band-Aid   PLAN OF CARE: Discharge to home after PACU   PATIENT DISPOSITION:  PACU - hemodynamically stable.

## 2019-02-21 ENCOUNTER — Other Ambulatory Visit: Payer: Self-pay

## 2019-02-21 NOTE — Progress Notes (Signed)
The patient wife states the patient has new red spot on his body with no itching noted / she reports the spots started when he started taking the Oxycodone . She also reports the patient has been having lower back pain ( pain level 5) but he was able to see the orthopedics yesterday. Dr Rudene Christians YM:EBRAXE wedge compression fracture of L3. KYPHOPLASTY L3 was done on 02/20/2019.

## 2019-02-21 NOTE — Progress Notes (Signed)
[facility]  [address] [place],  [address] Phone: [phone]  Fax: [phone]   Clinic Day:  02/22/2019  Referring physician: [name], MD  Chief Complaint: Andres Hebert is a 86 y.o. male with  stage IV adenocarcinoma of therightlung and recently diagnosed compression fracture of L3 who is seen for assessment and adjustment of his pain medications.   HPI: The patient was last seen in the medical oncology clinic on 02/15/2019. At that time, he had progressive back pain affecting quality of life and ADLs. Pain localized to L3-L4 region. He underwent urgent MRI of lumbar spine.  He received cycle #5 pembrolizumab.  MRI of the lumbar spine on 02/15/2019 revealed a mild inferior endplate compression fracture of L3 with vertebral body height loss of up to approximately 30% has an appearance most c/w a senile osteoporotic injury.  There was no evidence for metastatic disease.  There was partial visualization of a right pleural effusion. The patient had a right effusion on the prior chest CT.  There was an approximately 3.6 cm abdominal aortic aneurysm (chronic).  He was seen in consultation by Dr. Rudene Christians, orthopedic surgeon, on 02/16/2019. He underwent L3 kyphoplasty on 02/20/2019. Surgical pathology revealed focal new bone formation, extensive bone marrow edema and hemorrhage consistent with fracture.  Marrow was normocellular and negative for malignancy.   During the interim, his wife says he is still hurting; she believes it has to do with swelling in his back. She knows he is in pain due to his inability to stay still.  Symptomatically, he has extreme back pain. Pain is from the incision to his tailbone.  His wife says she only gives him 1 oxycodone, however 2 helps with his pain.  He usually takes 4-6 a day. Taking the 2 oxycodone helps his back pain more. He is no longer using a topical pain patch. He had a rash but it has since  cleared.   Past Medical History:  Diagnosis Date  . BPV (benign positional vertigo)   . Cancer ([facility])    lung cancer stage IV  . Chronic constipation   . Collapsed lung 1959   right  . Diabetes mellitus without complication (HCC)    borderline  . History of hiatal hernia   . Hyperlipidemia   . Hypertension     Past Surgical History:  Procedure Laterality Date  . CATARACT EXTRACTION, BILATERAL    . COLONOSCOPY    . ESOPHAGOGASTRODUODENOSCOPY    . EYE SURGERY    . KYPHOPLASTY N/A 02/20/2019   Procedure: L3 KYPHOPLASTY;  Surgeon: Hessie Knows, MD;  Location: [facility];  Service: Orthopedics;  Laterality: N/A;  . PORTA CATH INSERTION N/A 11/27/2018   Procedure: PORTA CATH INSERTION;  Surgeon: Algernon Huxley, MD;  Location: [facility];  Service: Cardiovascular;  Laterality: N/A;    Family History  Problem Relation Age of Onset  . Stroke Mother   . Epilepsy Mother   . Prostate cancer Father     Social History:  reports that he quit smoking about 45 years ago. His smoking use included cigarettes. He has a 25.00 pack-year smoking history. He has never used smokeless tobacco. He reports previous alcohol use. He reports that he does not use drugs. Hehas a 25-30 pack-year smoking history.He stoppedsmoking about 45 years ago.He denies any exposure to asbestos, radiation or toxins.He worked for [employer] and [place] lives in [place].His wife's name isSheila. The patient is accompanied by his wife via  video today.  Allergies:  Allergies  Allergen Reactions  . Accupril [Quinapril Hcl] Other (See Comments)    dizziness per MR    Current Medications: Current Outpatient Medications  Medication Sig Dispense Refill  . amLODipine (NORVASC) 2.5 MG tablet Take 2.5 mg by mouth daily.    Marland Kitchen aspirin EC 81 MG tablet Take 81 mg by mouth daily.    . citalopram (CELEXA) 10 MG tablet Take 10 mg by mouth daily.     . feeding supplement, ENSURE ENLIVE, (ENSURE ENLIVE) LIQD Take 237 mLs by  mouth 3 (three) times daily between meals. (Patient taking differently: Take 237 mLs by mouth 4 (four) times a week. ) 237 mL 0  . lidocaine-prilocaine (EMLA) cream Apply 1 application topically as needed. (Patient taking differently: Apply 1 application topically daily as needed (port access). ) 30 g 1  . oxyCODONE (OXY IR/ROXICODONE) 5 MG immediate release tablet Take 1 to 2 tablets every 4 hours as needed for pain.  Keep a pain diary. 40 tablet 0  . OXYGEN Inhale into the lungs as needed.    . polyethylene glycol (MIRALAX / GLYCOLAX) 17 g packet Take 17 g by mouth 2 (two) times daily. (Patient taking differently: Take 17 g by mouth daily as needed for mild constipation. ) 14 each 0  . potassium chloride (K-DUR,KLOR-CON) 10 MEQ tablet Take 10 mEq by mouth 2 (two) times daily.    . pravastatin (PRAVACHOL) 40 MG tablet Take 40 mg by mouth daily.    Marland Kitchen senna (SENOKOT) 8.6 MG TABS tablet Take 1 tablet (8.6 mg total) by mouth daily as needed for mild constipation. (Patient taking differently: Take 8.6-17.2 mg by mouth daily as needed for mild constipation. ) 30 tablet 0  . traZODone (DESYREL) 50 MG tablet Take 100 mg by mouth at bedtime.     . triamterene-hydrochlorothiazide (MAXZIDE-25) 37.5-25 MG tablet Take 1 tablet by mouth daily.    . ondansetron (ZOFRAN) 8 MG tablet Take 1 tablet (8 mg total) by mouth 2 (two) times daily as needed (Nausea or vomiting). Start if needed on the third day after chemotherapy. (Patient not taking: Reported on 02/21/2019) 30 tablet 1   No current facility-administered medications for this visit.    Review of Systems  Constitutional: Positive for weight loss (5 pounds). Negative for chills, diaphoresis, fever and malaise/fatigue.       Still hurting in back.  Restless.  Walks around room.  HENT: Negative.  Negative for congestion, ear pain, hearing loss, nosebleeds and sore throat.        Rhinorrhea.  Eyes: Negative.  Negative for blurred vision and double vision.   Respiratory: Positive for cough (chronic). Negative for hemoptysis, sputum production, shortness of breath and wheezing.   Cardiovascular: Negative.  Negative for chest pain, palpitations, orthopnea, leg swelling and PND.  Gastrointestinal: Positive for constipation (taking stool softeners, suppositories, and Miralax). Negative for abdominal pain, blood in stool, diarrhea, heartburn, melena, nausea and vomiting.       Good appetite. Drinking Ensure.  Genitourinary: Negative.  Negative for dysuria, flank pain, frequency, hematuria and urgency.  Musculoskeletal: Positive for back pain (pain persists despite kyphoplasty). Negative for falls, joint pain, myalgias and neck pain.       S/p kyphoplasty.  Skin: Negative for itching and rash.       Skin flakes. Sores on bottom secondary to sitting; using "baby butt cream".   Neurological: Positive for weakness (generalized). Negative for dizziness, tingling, sensory change, speech change, focal  weakness, seizures and headaches.  Endo/Heme/Allergies: Negative.  Does not bruise/bleed easily.  Psychiatric/Behavioral: Positive for memory loss. Negative for depression. The patient is not nervous/anxious and does not have insomnia.   All other systems reviewed and are negative.  Performance status (ECOG): 2  Vitals Blood pressure 128/76, pulse 80, temperature 97.6 F (36.4 C), temperature source Tympanic, resp. rate 18, weight 144 lb 4.7 oz (65.5 kg), SpO2 97 %.   Physical Exam  Constitutional: He is oriented to person, place, and time.  He unable to rest comfortably secondary to back pain.  HENT:  Head: Normocephalic and atraumatic.  Cap and mask.  Gray hair and beard.  Dentures.  Eyes: Conjunctivae and EOM are normal. No scleral icterus.  Glasses.  Blue eyes.  Musculoskeletal:        General: Tenderness present. Normal range of motion.     Comments: Lumbar spine pain s/p L3 kyphoplasty.  Neurological: He is alert and oriented to person, place,  and time.  Skin: Skin is warm, dry and intact. No bruising and no rash noted. He is not diaphoretic. No erythema. No pallor.  Psychiatric: He has a normal mood and affect. Judgment and thought content normal.  Restless due to back pain.  Nursing note and vitals reviewed.   Admission on 02/20/2019, Discharged on 02/20/2019  Component Date Value Ref Range Status  . Glucose-Capillary 02/20/2019 149* 70 - 99 mg/dL Final  . Sodium 02/20/2019 136  135 - 145 mmol/L Final  . Potassium 02/20/2019 3.7  3.5 - 5.1 mmol/L Final  . Chloride 02/20/2019 97* 98 - 111 mmol/L Final  . BUN 02/20/2019 21  8 - 23 mg/dL Final  . Creatinine, Ser 02/20/2019 1.10  0.61 - 1.24 mg/dL Final  . Glucose, Bld 02/20/2019 151* 70 - 99 mg/dL Final  . Calcium, Ion 02/20/2019 1.21  1.15 - 1.40 mmol/L Final  . TCO2 02/20/2019 27  22 - 32 mmol/L Final  . Hemoglobin 02/20/2019 14.6  13.0 - 17.0 g/dL Final  . HCT 02/20/2019 43.0  39.0 - 52.0 % Final  . Glucose-Capillary 02/20/2019 135* 70 - 99 mg/dL Final    Assessment:  Andres Hebert is a 86 y.o. male with metastaticadenocarcinoma of thelungs/p right sided thoracentesis on 10/06/2018.Cytologyconfirmed non-small cell carcinoma, favor adenocarcinoma of the lung.He has a 25-30 pack year smoking history.He has clinical stageT4NxM1b.  PD-L1 testing was 80% on 11/09/2018. Foundation Onetesting revealednumber mutational burden (TMB)18 Muts/Mb, MSI-stable, ATM splice site 1017-5Z>W, CDKN2A loss, CDKN2B loss, DNMT3A Y533C, FGF10 amplification, KRAS G12C, MTAP loss, and RBM10 A472f*216.  Chest CT angiogramon 10/05/2018 revealed a 8.4 x 6.9 cm right lower lobe mass with numerous pleural nodules and a large right pleural effusion. There was bony destruction of the right lateral 7th and 8th ribs. There was no pulmonary embolism.  Head MRIon 10/07/2018 revealed no metastatic disease or acute intracranial abnormality.There was distal left vertebral artery with poor  flow or occlusion, most likely due to atherosclerosis.  PET scanon 10/18/2018 revealed a large hypermetabolic RIGHT lower lobe mass consistent with bronchogenic carcinoma. There was multifocal pleural metastasis within the RIGHT hemithorax. There was one pleural metastatic lesion invading adjacent RIGHT seventh rib, and moderate RIGHT effusion. There was probable RIGHT hilar metastatic adenopathy, and no mediastinal or supraclavicular metastatic adenopathy.   He is s/pthoracentesisx 2 (10/06/2018 and10/05/2018).CT biopsyof the right lower lobelung masson 10/31/2018 was performed for mutational studies.  He is s/p cycle #1 Alimta and pembrolizumabon 11/03/2018.He iss/p5cycles ofpembrolizumabalone (11/24/2018-02/15/2019).  Chest CT on 02/12/2019  revealed interval improvement in the patient's malignancy. The primary mass was 3.9 x 3.6 cm (previously 5.7 x 7.5 cm).  The right pleural nodularity had improved in size and number of lesions (nodularity abutting right 5th rib was 1.5 x 0.5 cm; previously 2.9 x 1.4 cm). There were sclerotic changes in multiple right-sided ribs c/w treated metastatic disease. There was no right hilar adenopathy.  The right pleural effusion was smaller. There was severe emphysematous changes in the lungs. There was no metastatic disease below the diaphragm.   Bone scan on 02/14/2019 revealed a linear bandlike uptake along the inferior aspect of the L3 vertebral body. This would not be typical for metastatic disease and may be related to compression fracture.  Lumbar spine MRI could be used to further evaluate. There was radiotracer accumulation in the lateral right fifth and seventh ribs c/w known metastatic disease as characterized on previous chest CT and PET-CT. There was no unexpected uptake in the bony pelvis.  Lumbar spine MRI on 02/15/2019 revealed a mild inferior endplate compression fracture of L3 with vertebral body height loss of up to  approximately 30% has an appearance most c/w a senile osteoporotic injury.  There was no evidence for metastatic disease.  There was partial visualization of a right pleural effusion. The patient had a right effusion on the prior chest CT.  There was an approximately 3.6 cm abdominal aortic aneurysm (chronic).  He underwent L3 kyphoplasty on 02/20/2019.  He received the influenza vaccineon 10/13/2018.  Symptomatically, he has persistent back pain s/p kyphoplasty.  Plan: 1.   Stage IV adenocarcinoma of the lung Patient iss/p cycle #1 Alimta and pembrolizumab. Patient is s/p5cycles ofpembrolizumab alone(last 02/15/2019). Clinically,he isdoing well except for back pain felt related to compression fracture of the lumbar spine.             Chest CT on 02/12/2019 revealed improvement in pimary mass.                           Right pleural nodularity had improved in size and number of lesions.                          Sclerotic changes in multiple right-sided ribs c/w treated metastatic disease.              Bone scan on 02/14/2019 revealed linear bandlike uptake along the inferior aspect of the L3 vertebral body.                          Activity in the lateral right fifth and seventh ribs c/w known metastatic disease.             He denies hemoptysis. 2. Bone metastasis Patienthasright lower ribmetastasis seen on chest CT. Bone scan on 02/14/2019 revealed only lateral right 5th and 7th ribs c/w metastatic disease/             Consider future Xgeva. 3.   Back pain             Patient is s/p L3 kyphoplasty on 02/20/2019.             Patient has ongoing pain.    Contact Dr Theodore Demark office.  Spoke with Rachelle Hora, PA.  Lumbar films today.  RN or MD to contact patient with results of imaging studies. 4.   Cancer-related pain Cancer pain is well controlled.  Pain associated with compression fracture is  problematic.  Rx: oxycodone 5 mg po q 4 hours prn pain (dis: #40). 5.   RTC in 1 week for MD assessment. 6.   RTC in 2 weeks for MD assessment, labs (CBC with diff, CMP, TSH), and cycle #6 pembrolizumab.  I discussed the assessment and treatment plan with the patient.  The patient was provided an opportunity to ask questions and all were answered.  The patient agreed with the plan and demonstrated an understanding of the instructions.  The patient was advised to call back if the symptoms worsen or if the condition fails to improve as anticipated.  I provided 35 minutes (2:53 PM - 3:28 PM) of face-to-face time during this this encounter and > 50% was spent counseling as documented under my assessment and plan.    Lequita Asal, MD, PhD    02/22/2019, 3:28 PM  I, Samul Dada, am acting as a scribe for Lequita Asal, MD.  I, [place] Mike Gip, MD, have reviewed the above documentation for accuracy and completeness, and I agree with the above.

## 2019-02-21 NOTE — Anesthesia Postprocedure Evaluation (Signed)
Anesthesia Post Note  Patient: Andres Hebert  Procedure(s) Performed: L3 KYPHOPLASTY (N/A )  Patient location during evaluation: PACU Anesthesia Type: General Level of consciousness: awake and alert Pain management: pain level controlled Vital Signs Assessment: post-procedure vital signs reviewed and stable Respiratory status: spontaneous breathing, nonlabored ventilation, respiratory function stable and patient connected to nasal cannula oxygen Cardiovascular status: blood pressure returned to baseline and stable Postop Assessment: no apparent nausea or vomiting Anesthetic complications: no     Last Vitals:  Vitals:   02/20/19 1634 02/20/19 1726  BP: 136/79 136/68  Pulse:  66  Resp: 16 14  Temp: 36.4 C   SpO2: 95% 97%    Last Pain:  Vitals:   02/20/19 1726  TempSrc:   PainSc: 7                  Precious Haws Kendalynn Wideman

## 2019-02-22 ENCOUNTER — Inpatient Hospital Stay (HOSPITAL_BASED_OUTPATIENT_CLINIC_OR_DEPARTMENT_OTHER): Payer: Medicare HMO | Admitting: Hematology and Oncology

## 2019-02-22 ENCOUNTER — Other Ambulatory Visit: Payer: Self-pay

## 2019-02-22 ENCOUNTER — Ambulatory Visit
Admission: RE | Admit: 2019-02-22 | Discharge: 2019-02-22 | Disposition: A | Discharge status: Home / Self Care | Payer: Medicare HMO | Source: Ambulatory Visit | Attending: Hematology and Oncology | Admitting: Hematology and Oncology

## 2019-02-22 ENCOUNTER — Encounter: Payer: Self-pay | Admitting: Hematology and Oncology

## 2019-02-22 ENCOUNTER — Ambulatory Visit
Admission: RE | Admit: 2019-02-22 | Discharge: 2019-02-22 | Disposition: A | Discharge status: Home / Self Care | Payer: Medicare HMO | Attending: Hematology and Oncology | Admitting: Hematology and Oncology

## 2019-02-22 VITALS — BP 128/76 | HR 80 | Temp 97.6°F | Resp 18 | Wt 144.3 lb

## 2019-02-22 DIAGNOSIS — G893 Neoplasm related pain (acute) (chronic): Secondary | ICD-10-CM

## 2019-02-22 DIAGNOSIS — C3431 Malignant neoplasm of lower lobe, right bronchus or lung: Secondary | ICD-10-CM

## 2019-02-22 DIAGNOSIS — S32030D Wedge compression fracture of third lumbar vertebra, subsequent encounter for fracture with routine healing: Secondary | ICD-10-CM | POA: Diagnosis not present

## 2019-02-22 DIAGNOSIS — S32000D Wedge compression fracture of unspecified lumbar vertebra, subsequent encounter for fracture with routine healing: Secondary | ICD-10-CM | POA: Insufficient documentation

## 2019-02-22 DIAGNOSIS — C7951 Secondary malignant neoplasm of bone: Secondary | ICD-10-CM | POA: Diagnosis not present

## 2019-02-22 MED ORDER — OXYCODONE HCL 5 MG PO TABS: ORAL_TABLET | ORAL | 0 refills | Status: DC

## 2019-02-22 NOTE — Progress Notes (Signed)
Pt in for follow up, states having back pain, rates 3-7 on pain scale.

## 2019-02-23 ENCOUNTER — Telehealth: Payer: Self-pay

## 2019-02-23 ENCOUNTER — Other Ambulatory Visit: Payer: Self-pay

## 2019-02-23 DIAGNOSIS — C7951 Secondary malignant neoplasm of bone: Secondary | ICD-10-CM

## 2019-02-23 DIAGNOSIS — M25559 Pain in unspecified hip: Secondary | ICD-10-CM

## 2019-02-23 DIAGNOSIS — G893 Neoplasm related pain (acute) (chronic): Secondary | ICD-10-CM

## 2019-02-23 DIAGNOSIS — M545 Low back pain, unspecified: Secondary | ICD-10-CM

## 2019-02-23 LAB — SURGICAL PATHOLOGY

## 2019-02-23 MED ORDER — OXYCODONE HCL 5 MG PO TABS: ORAL_TABLET | ORAL | 0 refills | Status: DC

## 2019-02-23 NOTE — Telephone Encounter (Signed)
Spoke with the patient to inform his wife his medication has been refilled.

## 2019-02-27 ENCOUNTER — Encounter: Payer: Self-pay | Admitting: Hematology and Oncology

## 2019-02-27 NOTE — Progress Notes (Signed)
Today the patient states the pain is slightly better ( today pain level 3) .The patient pain is better with lying down, with movement it feels like something is biting him. The patient name and DOB has been verified by phone today.

## 2019-02-27 NOTE — Progress Notes (Signed)
[facility]  [address], [address] [place], [place] [address] Phone: [phone]  Fax: [phone]   Clinic Day:  03/01/2019  Referring physician: [name], MD  Chief Complaint: Andres Hebert is a 86 y.o. male with stage IV adenocarcinoma of therightlung and recently diagnosed compression fracture of L3 who is seen for a 1 week assessment.  HPI: The patient was last seen in the medical oncology clinic on 02/22/2019. At that time, he had persistent pain s/p kyphoplasty.  Patient remained on oxycodone prn for pain.  Lumbar spine films were discussed.  He received cycle #5 pembrolizumab.   Lumbar spine films on 02/22/2019 showed changes of L3 vertebral augmentation without complicating features. There was no new acute fracture. There were degenerative changes of the spine. There was osteopenia. There was an infrarenal abdominal aortic aneurysm.   During the interim, he has felt better. His lumbar spine pain is slight better (3/10). Pain improves when lying down. With movement, it feels like something is biting him. His wife thinks he is getting better. His wife notes that he has not taken any pain medication for 2 days.   He is eating well.  I encouraged good calorie intake. His bowels are better.  His bottom is healing nicely and he does not require topical cream.    Past Medical History:  Diagnosis Date  . BPV (benign positional vertigo)   . Cancer ([place])    lung cancer stage IV  . Chronic constipation   . Collapsed lung 1959   right  . Diabetes mellitus without complication (HCC)    borderline  . History of hiatal hernia   . Hyperlipidemia   . Hypertension     Past Surgical History:  Procedure Laterality Date  . CATARACT EXTRACTION, BILATERAL    . COLONOSCOPY    . ESOPHAGOGASTRODUODENOSCOPY    . EYE SURGERY    . KYPHOPLASTY N/A 02/20/2019   Procedure: L3 KYPHOPLASTY;  Surgeon: Hessie Knows, MD;  Location: [facility] ORS;  Service:  Orthopedics;  Laterality: N/A;  . PORTA CATH INSERTION N/A 11/27/2018   Procedure: PORTA CATH INSERTION;  Surgeon: Algernon Huxley, MD;  Location: [facility];  Service: Cardiovascular;  Laterality: N/A;    Family History  Problem Relation Age of Onset  . Stroke Mother   . Epilepsy Mother   . Prostate cancer Father     Social History:  reports that he quit smoking about 45 years ago. His smoking use included cigarettes. He has a 25.00 pack-year smoking history. He has never used smokeless tobacco. He reports previous alcohol use. He reports that he does not use drugs. Hehas a 25-30 pack-year smoking history.He stoppedsmoking about 45 years ago.He denies any exposure to asbestos, radiation or toxins.He worked for [employer] and [place] lives in [place].His wife's name isSheila. The patient is accompanied by [name] today.  Allergies:  Allergies  Allergen Reactions  . Accupril [Quinapril Hcl] Other (See Comments)    dizziness per MR    Current Medications: Current Outpatient Medications  Medication Sig Dispense Refill  . amLODipine (NORVASC) 2.5 MG tablet Take 2.5 mg by mouth daily.    Marland Kitchen aspirin EC 81 MG tablet Take 81 mg by mouth daily.    . citalopram (CELEXA) 10 MG tablet Take 10 mg by mouth daily.     . feeding supplement, ENSURE ENLIVE, (ENSURE ENLIVE) LIQD Take 237 mLs by mouth 3 (three) times daily between meals. (Patient taking differently: Take 237 mLs by mouth  4 (four) times a week. ) 237 mL 0  . lidocaine-prilocaine (EMLA) cream Apply 1 application topically as needed. (Patient taking differently: Apply 1 application topically daily as needed (port access). ) 30 g 1  . oxyCODONE (OXY IR/ROXICODONE) 5 MG immediate release tablet Take 1 tablet every 4 hours as needed for pain.  Keep a pain diary. 40 tablet 0  . OXYGEN Inhale into the lungs as needed.    . polyethylene glycol (MIRALAX / GLYCOLAX) 17 g packet Take 17 g by mouth 2 (two) times daily. (Patient taking  differently: Take 17 g by mouth daily as needed for mild constipation. ) 14 each 0  . potassium chloride (K-DUR,KLOR-CON) 10 MEQ tablet Take 10 mEq by mouth 2 (two) times daily.    . pravastatin (PRAVACHOL) 40 MG tablet Take 40 mg by mouth daily.    Marland Kitchen senna (SENOKOT) 8.6 MG TABS tablet Take 1 tablet (8.6 mg total) by mouth daily as needed for mild constipation. (Patient taking differently: Take 8.6-17.2 mg by mouth daily as needed for mild constipation. ) 30 tablet 0  . traZODone (DESYREL) 50 MG tablet Take 100 mg by mouth at bedtime.     . triamterene-hydrochlorothiazide (MAXZIDE-25) 37.5-25 MG tablet Take 1 tablet by mouth daily.    . ondansetron (ZOFRAN) 8 MG tablet Take 1 tablet (8 mg total) by mouth 2 (two) times daily as needed (Nausea or vomiting). Start if needed on the third day after chemotherapy. (Patient not taking: Reported on 02/27/2019) 30 tablet 1   No current facility-administered medications for this visit.    Review of Systems  Constitutional: Positive for weight loss (5 pounds). Negative for chills, diaphoresis, fever and malaise/fatigue.       Feels "better". Patient is up walking around in room. His wife assist him with ADLs. Unable to sit for long periods secondary to discomfort/pain.   HENT: Negative for congestion, hearing loss, nosebleeds, sinus pain and sore throat.        Clear/white nasal secretions. Rhinorrhea.  Eyes: Negative for blurred vision.  Respiratory: Negative for cough (chronic), hemoptysis, sputum production and shortness of breath.   Cardiovascular: Negative for chest pain, palpitations and leg swelling.  Gastrointestinal: Negative for abdominal pain, blood in stool, constipation (on stool softeners; suppositories, and Miralax), diarrhea, heartburn, melena, nausea and vomiting.       Good appetite. Eating well. Drinking ensure. Bowels are better.  Genitourinary: Negative for dysuria, frequency, hematuria and urgency.  Musculoskeletal: Positive for back  pain (lumbar spine pain (3/10) better; pain increased with movement). Negative for myalgias and neck pain.  Skin: Negative for itching and rash.       Skin flakes. Sores on bottom secondary to sitting; using "baby butt cream"; improving.  Neurological: Positive for weakness (generalized). Negative for dizziness, tingling, sensory change and headaches.  Endo/Heme/Allergies: Does not bruise/bleed easily.  Psychiatric/Behavioral: Positive for memory loss. Negative for depression. The patient is not nervous/anxious and does not have insomnia.   All other systems reviewed and are negative.   Performance status (ECOG): 2  Vitals Blood pressure 115/66, pulse 71, temperature (!) 97 F (36.1 C), temperature source Tympanic, resp. rate 18, height 5' 6"  (1.676 m), weight 139 lb 3.2 oz (63.1 kg), SpO2 94 %.   Physical Exam  Constitutional: He is oriented to person, place, and time. He appears well-developed and well-nourished. No distress.  He is unable to rest comfortably secondary to back pain. Patient walks around to relieve back pain.  HENT:  Head: Normocephalic and atraumatic.  Black cap and mask. Gray hair and beard.  Eyes: Conjunctivae and EOM are normal. No scleral icterus.  Glasses. Blue eyes.  Neurological: He is alert and oriented to person, place, and time.  Skin: He is not diaphoretic.  Psychiatric: He has a normal mood and affect. His behavior is normal. Judgment and thought content normal.  Nursing note and vitals reviewed.   No visits with results within 3 Day(s) from this visit.  Latest known visit with results is:  Admission on 02/20/2019, Discharged on 02/20/2019  Component Date Value Ref Range Status  . Glucose-Capillary 02/20/2019 149* 70 - 99 mg/dL Final  . Sodium 02/20/2019 136  135 - 145 mmol/L Final  . Potassium 02/20/2019 3.7  3.5 - 5.1 mmol/L Final  . Chloride 02/20/2019 97* 98 - 111 mmol/L Final  . BUN 02/20/2019 21  8 - 23 mg/dL Final  . Creatinine, Ser  02/20/2019 1.10  0.61 - 1.24 mg/dL Final  . Glucose, Bld 02/20/2019 151* 70 - 99 mg/dL Final  . Calcium, Ion 02/20/2019 1.21  1.15 - 1.40 mmol/L Final  . TCO2 02/20/2019 27  22 - 32 mmol/L Final  . Hemoglobin 02/20/2019 14.6  13.0 - 17.0 g/dL Final  . HCT 02/20/2019 43.0  39.0 - 52.0 % Final  . SURGICAL PATHOLOGY 02/20/2019    Final-Edited                   Value:SURGICAL PATHOLOGY CASE: [id number] PATIENT: [facility] Surgical Pathology Report  Specimen Submitted: A. L3 bone; bx  Clinical History: Closed wedge compression fracture of third lumbar vertebra. 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 PET/CT and MRI findings L3 consistent with osteoporotic disease rather than metastasis.  DIAGNOSIS: A. BONE, THIRD LUMBAR VERTEBRA; BIOPSY: - FOCAL NEW BONE FORMATION, EXTENSIVE BONE MARROW EDEMA, AND HEMORRHAGE, CONSISTENT WITH FRACTURE. - NORMOCELLULAR BONE MARROW. - NEGATIVE FOR MALIGNANCY.  GROSS DESCRIPTION: A. Labeled: L3 bone biopsy Received: In formalin Tissue fragment(s): 1 Size: 1.1 cm in length and 0.1 cm in diameter Description: Bone tissue core surrounded by fresh blood clot Entirely submitted in 1 cassette after decalcification.  Final Diagnosis performed by Bryan Lemma, MD.   Electronically signed 02/23/2019 10:25:56AM The electronic                          signature indicates that the named Attending Pathologist has evaluated the specimen Technical component performed at [facility], [address], [place], [place] [address] Lab: [phone] Dir: Rush Farmer, MD, MMM  Professional component performed at [facility], [facility], [place], [place], [place] [address] Lab: [phone] Dir: Dellia Nims. Reuel Derby, MD  . Glucose-Capillary 02/20/2019 135* 70 - 99 mg/dL Final    Assessment:  Andres Hebert is a 86 y.o. male with metastaticadenocarcinoma of thelungs/p right sided  thoracentesis on 10/06/2018.Cytologyconfirmed non-small cell carcinoma, favor adenocarcinoma of the lung.He has a 25-30 pack year smoking history.He has clinical stageT4NxM1b.  PD-L1 testing was 80% on 11/09/2018. Foundation Onetesting revealednumber mutational burden (TMB)18 Muts/Mb, MSI-stable, ATM splice site 2876-8T>L, CDKN2A loss, CDKN2B loss, DNMT3A Y533C, FGF10 amplification, KRAS G12C, MTAP loss, and RBM10 A480f*216.  Chest CT angiogramon 10/05/2018 revealed a 8.4 x 6.9 cm right lower lobe mass with numerous pleural nodules and a large right pleural effusion. There was bony destruction of the right lateral 7th and 8th ribs. There was no pulmonary embolism.  Head  [place] 10/07/2018 revealed no metastatic disease or acute intracranial abnormality.There was distal left vertebral artery with poor flow or occlusion, most likely due to atherosclerosis.  PET scanon 10/18/2018 revealed a large hypermetabolic RIGHT lower lobe mass consistent with bronchogenic carcinoma. There was multifocal pleural metastasis within the RIGHT hemithorax. There was one pleural metastatic lesion invading adjacent RIGHT seventh rib, and moderate RIGHT effusion. There was probable RIGHT hilar metastatic adenopathy, and no mediastinal or supraclavicular metastatic adenopathy.   He is s/pthoracentesisx 2 (10/06/2018 and10/05/2018).CT biopsyof the right lower lobelung masson 10/31/2018 was performed for mutational studies.  He is s/p cycle #1 Alimta and pembrolizumabon 11/03/2018.He iss/p5cycles ofpembrolizumabalone (11/24/2018-02/15/2019).  Chest CTon 02/12/2019 revealed interval improvement in the patient's malignancy. The primary mass was3.9 x 3.6 cm (previously 5.7 x 7.5 cm). The right pleural nodularityhadimproved insize andnumberof lesions(nodularity abutting right 5th rib was1.5 x 0.5 cm; previously 2.9 x 1.4 cm). There were sclerotic changes in multiple right-sided  ribsc/wtreated metastatic disease. There was no right hilar adenopathy. The right pleural effusion was smaller. There was severe emphysematous changes in the lungs. There was no metastatic disease below the diaphragm.   Bone scanon 02/14/2019 revealed a linear bandlike uptake along the inferior aspect of the L3 vertebral body. This would not be typical for metastatic disease and may be related to compression fracture. Lumbar spine MRI could be used to further evaluate. There was radiotracer accumulation in the lateral right fifth and seventh ribsc/wknown metastatic disease as characterized on previous chest CT and PET-CT. There was no unexpected uptake in the bony pelvis.  Lumbar spine MRI on 02/15/2019 revealed amild inferior endplate compression fracture of L3with vertebral body height loss of up to approximately 30% has an appearance most c/wa senile osteoporotic injury.  There was no evidencefor metastatic disease. There was partial visualization of a right pleural effusion. The patient had a right effusion on the prior chest CT.There was an approximately 3.6 cm abdominal aortic aneurysm(chronic).  He underwent L3 kyphoplasty on 02/20/2019.  He received the influenza vaccineon 10/13/2018.  Symptomatically, back pain has improved.  He is using less pain medications.  Plan: 1. Stage IV adenocarcinoma of the lung Patient iss/p cycle #1 Alimta and pembrolizumab. Patient is s/p5 cycles ofpembrolizumab alone(last01/21/2021). Clinically,he continues to do well except for back pain related related to a benign compression fracture of his lumbar spine. Chest CT on 01/18/2021revealed improvement in pimary mass.  Right pleural nodularityhadimproved insize andnumberof lesions. There were sclerotic changes in multiple right-sided ribsc/wtreated metastatic disease.   There was no right hilar adenopathy.Right pleural effusion was smaller. Bone scan on 02/14/2019 revealed a linear bandlike uptake along the inferior aspect of the L3 vertebral body. There was ativityin the lateral right fifth and seventh ribs c/wmetastatic disease. 2. Bone metastasis Patienthasright lower ribmetastasis seen on chest CT. Chest CT revealed sclerotic changes in right ribs.  Bone scan on 02/14/2019 revealed only lateral right 5th and 7th ribs c/w metastatic disease. 3. Back pain Etiology is secondary to the benign compression fracture.  Lumbar spine MRI on 02/15/2019 revealed amild inferior endplate compression fracture of L3. Patient underwent kyphoplasty on 02/20/2019.  Lumbar spine plain films on 02/22/2019 revealed no acute changes.  Follow-up with Dr, Rudene Christians on 03/07/2019. 4.Cancer-related pain Pain is well controlled.  He is using less pain medications. 5. Constipation Etiology secondary to pain medications. Continue stool softeners, laxatives, and "p" fruits/juices. 6.  RTC next week as previously scheduled.   I discussed the assessment and treatment plan with the patient.  The patient was provided an opportunity to ask questions and all were answered.  The patient agreed with the plan and demonstrated an understanding of the instructions.  The patient was advised to call back if the symptoms worsen or if the condition fails to improve as anticipated.   Lequita Asal, MD, PhD    03/01/2019, 3:12 PM  I, Selena Batten, am acting as scribe for [employer]. Mike Gip, MD, PhD.  I, Melissa C. Mike Gip, MD, have reviewed the above documentation for accuracy and completeness, and I agree with the above.

## 2019-03-01 ENCOUNTER — Other Ambulatory Visit: Payer: Self-pay

## 2019-03-01 ENCOUNTER — Inpatient Hospital Stay: Payer: Medicare HMO | Attending: Hematology and Oncology | Admitting: Hematology and Oncology

## 2019-03-01 ENCOUNTER — Encounter: Payer: Self-pay | Admitting: Hematology and Oncology

## 2019-03-01 VITALS — BP 115/66 | HR 71 | Temp 97.0°F | Resp 18 | Ht 66.0 in | Wt 139.2 lb

## 2019-03-01 DIAGNOSIS — M545 Low back pain, unspecified: Secondary | ICD-10-CM

## 2019-03-01 DIAGNOSIS — S32030D Wedge compression fracture of third lumbar vertebra, subsequent encounter for fracture with routine healing: Secondary | ICD-10-CM

## 2019-03-01 DIAGNOSIS — M549 Dorsalgia, unspecified: Secondary | ICD-10-CM | POA: Insufficient documentation

## 2019-03-01 DIAGNOSIS — G893 Neoplasm related pain (acute) (chronic): Secondary | ICD-10-CM

## 2019-03-01 DIAGNOSIS — C7951 Secondary malignant neoplasm of bone: Secondary | ICD-10-CM | POA: Diagnosis not present

## 2019-03-01 DIAGNOSIS — Z8042 Family history of malignant neoplasm of prostate: Secondary | ICD-10-CM | POA: Insufficient documentation

## 2019-03-01 DIAGNOSIS — Z79899 Other long term (current) drug therapy: Secondary | ICD-10-CM | POA: Insufficient documentation

## 2019-03-01 DIAGNOSIS — Z5112 Encounter for antineoplastic immunotherapy: Secondary | ICD-10-CM | POA: Diagnosis not present

## 2019-03-01 DIAGNOSIS — Z820 Family history of epilepsy and other diseases of the nervous system: Secondary | ICD-10-CM | POA: Diagnosis not present

## 2019-03-01 DIAGNOSIS — C3431 Malignant neoplasm of lower lobe, right bronchus or lung: Secondary | ICD-10-CM

## 2019-03-01 DIAGNOSIS — Z823 Family history of stroke: Secondary | ICD-10-CM | POA: Diagnosis not present

## 2019-03-05 NOTE — Progress Notes (Signed)
[facility]  [address], [address] [place], [place] [address] Phone: [phone]  Fax: [phone]   Clinic Day:  03/08/2019  Referring physician: [name], MD  Chief Complaint: Andres Hebert is a 86 y.o. male with stage IV adenocarcinoma of therightlungand non-pathologic L3 compression fracture s/p kyphoplasty who is seen for assessment prior to cycle #6 pembrolizumab.   HPI: The patient was last seen in the medical oncology clinic on 03/01/2019. At that time, lumbar pain s/p kyphoplasty was improving.  He had not taken pain medication in 2 days.  We discussed plans for continuation of pembrolizumab.  During the interim, he has felt good. He has some abdominal pain related to all the medications he is taking. He reports falling out of the truck yesterday. He denies any trauma to the head. He notes only having a few scratches s/p fall.  He notes his back is sore. His wife notes that he is restless.   His face feels itchy. He denies having any sores on his bottom. He continues to have rhinorrhea. He notes having a bowel movement today and needed the nurses help today.  His wife gives him Miralax for constipation. She reports that he is eating well.   He was seen by Rachelle Hora, PA in the orthopedics clinic on 03/07/2019. He was put on 10 mg prednisone for 6 days (day 1-  6 tablets; day 2- 5 tablets; day 3- 4 tablets; day 4- 3 tablets; day 5- 2 tablets; day 6- 1 tablet) . He took six 10 mg tablets on 03/07/2019.  He notes it is too early to tell if the medication is working.    Past Medical History:  Diagnosis Date  . BPV (benign positional vertigo)   . Cancer ([place])    lung cancer stage IV  . Chronic constipation   . Collapsed lung 1959   right  . Diabetes mellitus without complication (HCC)    borderline  . History of hiatal hernia   . Hyperlipidemia   . Hypertension     Past Surgical History:  Procedure Laterality Date  . CATARACT  EXTRACTION, BILATERAL    . COLONOSCOPY    . ESOPHAGOGASTRODUODENOSCOPY    . EYE SURGERY    . KYPHOPLASTY N/A 02/20/2019   Procedure: L3 KYPHOPLASTY;  Surgeon: Hessie Knows, MD;  Location: [facility] ORS;  Service: Orthopedics;  Laterality: N/A;  . PORTA CATH INSERTION N/A 11/27/2018   Procedure: PORTA CATH INSERTION;  Surgeon: Algernon Huxley, MD;  Location: [facility];  Service: Cardiovascular;  Laterality: N/A;    Family History  Problem Relation Age of Onset  . Stroke Mother   . Epilepsy Mother   . Prostate cancer Father     Social History:  reports that he quit smoking about 45 years ago. His smoking use included cigarettes. He has a 25.00 pack-year smoking history. He has never used smokeless tobacco. He reports previous alcohol use. He reports that he does not use drugs. He stoppedsmoking about 45 years ago.He denies any exposure to asbestos, radiation or toxins.He worked for [employer] and [place] lives in [place].His wife's name isSheila. The patient is accompanied by [name] via [facility] today.  Allergies:  Allergies  Allergen Reactions  . Accupril [Quinapril Hcl] Other (See Comments)    dizziness per MR    Current Medications: Current Outpatient Medications  Medication Sig Dispense Refill  . amLODipine (NORVASC) 2.5 MG tablet Take 2.5 mg by mouth daily.    Marland Kitchen aspirin  EC 81 MG tablet Take 81 mg by mouth daily.    . citalopram (CELEXA) 10 MG tablet Take 10 mg by mouth daily.     . feeding supplement, ENSURE ENLIVE, (ENSURE ENLIVE) LIQD Take 237 mLs by mouth 3 (three) times daily between meals. (Patient taking differently: Take 237 mLs by mouth 4 (four) times a week. ) 237 mL 0  . lidocaine-prilocaine (EMLA) cream Apply 1 application topically as needed. (Patient taking differently: Apply 1 application topically daily as needed (port access). ) 30 g 1  . oxyCODONE (OXY IR/ROXICODONE) 5 MG immediate release tablet Take 1 tablet every 4 hours as needed for pain.  Keep a pain diary.  40 tablet 0  . OXYGEN Inhale into the lungs as needed.    . polyethylene glycol (MIRALAX / GLYCOLAX) 17 g packet Take 17 g by mouth 2 (two) times daily. (Patient taking differently: Take 17 g by mouth daily as needed for mild constipation. ) 14 each 0  . potassium chloride (K-DUR,KLOR-CON) 10 MEQ tablet Take 10 mEq by mouth 2 (two) times daily.    . pravastatin (PRAVACHOL) 40 MG tablet Take 40 mg by mouth daily.    . predniSONE (DELTASONE) 10 MG tablet Take 10 mg by mouth daily. 6 Tablets Today, 5 Tablets Tomorrow (03/08/2019)    . senna (SENOKOT) 8.6 MG TABS tablet Take 1 tablet (8.6 mg total) by mouth daily as needed for mild constipation. (Patient taking differently: Take 8.6-17.2 mg by mouth daily as needed for mild constipation. ) 30 tablet 0  . traZODone (DESYREL) 50 MG tablet Take 100 mg by mouth at bedtime.     . triamterene-hydrochlorothiazide (MAXZIDE-25) 37.5-25 MG tablet Take 1 tablet by mouth daily.    . ondansetron (ZOFRAN) 8 MG tablet Take 1 tablet (8 mg total) by mouth 2 (two) times daily as needed (Nausea or vomiting). Start if needed on the third day after chemotherapy. (Patient not taking: Reported on 02/27/2019) 30 tablet 1   No current facility-administered medications for this visit.   Facility-Administered Medications Ordered in Other Visits  Medication Dose Route Frequency Provider Last Rate Last Admin  . heparin lock flush 100 unit/mL  500 Units Intravenous Once Shaunte Weissinger C, MD      . sodium chloride flush (NS) 0.9 % injection 10 mL  10 mL Intravenous PRN Lequita Asal, MD   10 mL at 03/08/19 0920    Review of Systems  Constitutional: Negative for chills, diaphoresis, fever, malaise/fatigue and weight loss (up 8 pounds).       Feels "good".  He is up walking around in room. His wife assist him with ADLs.  HENT: Negative for congestion, ear pain, hearing loss, nosebleeds, sinus pain and sore throat.        Clear/white nasal secretions.  Eyes: Negative.   Negative for blurred vision, double vision and photophobia.  Respiratory: Negative for cough (chronic), hemoptysis, sputum production and shortness of breath.   Cardiovascular: Negative.  Negative for chest pain, palpitations, orthopnea, claudication and leg swelling.  Gastrointestinal: Positive for abdominal pain (related to medications). Negative for blood in stool, constipation (on stool softeners; suppositories; Miralax), diarrhea, heartburn, melena, nausea and vomiting.       Good appetite. Eating well. Drinking ensure. Bowels are better.  Genitourinary: Negative for dysuria, frequency, hematuria and urgency.  Musculoskeletal: Positive for back pain (lumbar spine- improved; pain increased with movement) and falls (1 day ago). Negative for joint pain, myalgias and neck pain.  Skin: Positive  for itching (face). Negative for rash.       Skin flakes. Sores on bottom secondary to sitting; using "baby butt cream"- improving.  Neurological: Positive for weakness (generalized). Negative for dizziness, tingling, sensory change, speech change, focal weakness and headaches.  Endo/Heme/Allergies: Negative.  Does not bruise/bleed easily.  Psychiatric/Behavioral: Positive for memory loss. Negative for depression. The patient is not nervous/anxious and does not have insomnia.   All other systems reviewed and are negative.   Performance status (ECOG): 2-3  Vitals Blood pressure 128/77, pulse 90, temperature (!) 96 F (35.6 C), temperature source Tympanic, resp. rate 18, height 5' 6"  (1.676 m), weight 147 lb 14.9 oz (67.1 kg), SpO2 99 %.   Physical Exam  Constitutional: He is oriented to person, place, and time. He appears well-developed and well-nourished. No distress.  Patient examined in wheelchair.  HENT:  Head: Normocephalic and atraumatic.  Mouth/Throat: Oropharynx is clear and moist. No oropharyngeal exudate.  Cap and mask. Gray hair and beard. Dentures.  Eyes: Pupils are equal, round, and  reactive to light. Conjunctivae and EOM are normal. No scleral icterus.  Glasses. Blue eyes.  Cardiovascular: Normal rate, regular rhythm and normal heart sounds.  No murmur heard. Pulmonary/Chest: Effort normal and breath sounds normal. No respiratory distress. He has no wheezes. He has no rales. He exhibits no tenderness.  Abdominal: Soft. Bowel sounds are normal. He exhibits no distension and no mass. There is no abdominal tenderness. There is no rebound and no guarding.  Musculoskeletal:        General: No edema. Normal range of motion.     Cervical back: Normal range of motion and neck supple.     Lumbar back: Tenderness (L3) present.  Lymphadenopathy:    He has no cervical adenopathy.    He has no axillary adenopathy.       Right: No supraclavicular adenopathy present.       Left: No supraclavicular adenopathy present.  Neurological: He is alert and oriented to person, place, and time.  Skin: Skin is warm and dry. He is not diaphoretic.  Psychiatric: He has a normal mood and affect. His behavior is normal. Judgment and thought content normal.  Nursing note and vitals reviewed.   Infusion on 03/08/2019  Component Date Value Ref Range Status  . Sodium 03/08/2019 132* 135 - 145 mmol/L Final  . Potassium 03/08/2019 3.2* 3.5 - 5.1 mmol/L Final  . Chloride 03/08/2019 95* 98 - 111 mmol/L Final  . CO2 03/08/2019 29  22 - 32 mmol/L Final  . Glucose, Bld 03/08/2019 254* 70 - 99 mg/dL Final  . BUN 03/08/2019 22  8 - 23 mg/dL Final  . Creatinine, Ser 03/08/2019 1.01  0.61 - 1.24 mg/dL Final  . Calcium 03/08/2019 9.6  8.9 - 10.3 mg/dL Final  . Total Protein 03/08/2019 7.5  6.5 - 8.1 g/dL Final  . Albumin 03/08/2019 3.8  3.5 - 5.0 g/dL Final  . AST 03/08/2019 24  15 - 41 U/L Final  . ALT 03/08/2019 15  0 - 44 U/L Final  . Alkaline Phosphatase 03/08/2019 73  38 - 126 U/L Final  . Total Bilirubin 03/08/2019 0.7  0.3 - 1.2 mg/dL Final  . GFR calc non Af Amer 03/08/2019 >60  >60 mL/min Final   . GFR calc Af Amer 03/08/2019 >60  >60 mL/min Final  . Anion gap 03/08/2019 8  5 - 15 Final   Performed at [facility], [address] [place], [place] [address]  .  WBC 03/08/2019 6.9  4.0 - 10.5 K/uL Final  . RBC 03/08/2019 4.24  4.22 - 5.81 MIL/uL Final  . Hemoglobin 03/08/2019 13.5  13.0 - 17.0 g/dL Final  . HCT 03/08/2019 39.8  39.0 - 52.0 % Final  . MCV 03/08/2019 93.9  80.0 - 100.0 fL Final  . MCH 03/08/2019 31.8  26.0 - 34.0 pg Final  . MCHC 03/08/2019 33.9  30.0 - 36.0 g/dL Final  . RDW 03/08/2019 12.7  11.5 - 15.5 % Final  . Platelets 03/08/2019 155  150 - 400 K/uL Final  . nRBC 03/08/2019 0.0  0.0 - 0.2 % Final  . Neutrophils Relative % 03/08/2019 77  % Final  . Neutro Abs 03/08/2019 5.3  1.7 - 7.7 K/uL Final  . Lymphocytes Relative 03/08/2019 17  % Final  . Lymphs Abs 03/08/2019 1.2  0.7 - 4.0 K/uL Final  . Monocytes Relative 03/08/2019 6  % Final  . Monocytes Absolute 03/08/2019 0.4  0.1 - 1.0 K/uL Final  . Eosinophils Relative 03/08/2019 0  % Final  . Eosinophils Absolute 03/08/2019 0.0  0.0 - 0.5 K/uL Final  . Basophils Relative 03/08/2019 0  % Final  . Basophils Absolute 03/08/2019 0.0  0.0 - 0.1 K/uL Final  . Immature Granulocytes 03/08/2019 0  % Final  . Abs Immature Granulocytes 03/08/2019 0.02  0.00 - 0.07 K/uL Final   Performed at [facility], [address] [place], [place] [address]    Assessment:  Andres Hebert is a 86 y.o. male with metastaticadenocarcinoma of thelungs/p right sided thoracentesis on 10/06/2018.Cytologyconfirmed non-small cell carcinoma, favor adenocarcinoma of the lung.He has a 25-30 pack year smoking history.He has clinical stageT4NxM1b.  PD-L1 testing was 80% on 11/09/2018. Foundation Onetesting revealednumber mutational burden (TMB)18 Muts/Mb, MSI-stable, ATM splice site [id number]>J, CDKN2A loss, CDKN2B loss, DNMT3A Y533C, FGF10 amplification, KRAS G12C, MTAP loss, and RBM10 A460f*216.  Chest  CT angiogramon 10/05/2018 revealed a 8.4 x 6.9 cm right lower lobe mass with numerous pleural nodules and a large right pleural effusion. There was bony destruction of the right lateral 7th and 8th ribs. There was no pulmonary embolism.  Head MRIon 10/07/2018 revealed no metastatic disease or acute intracranial abnormality.There was distal left vertebral artery with poor flow or occlusion, most likely due to atherosclerosis.  PET scanon 10/18/2018 revealed a large hypermetabolic RIGHT lower lobe mass consistent with bronchogenic carcinoma. There was multifocal pleural metastasis within the RIGHT hemithorax. There was one pleural metastatic lesion invading adjacent RIGHT seventh rib, and moderate RIGHT effusion. There was probable RIGHT hilar metastatic adenopathy, and no mediastinal or supraclavicular metastatic adenopathy.   He is s/pthoracentesisx 2 (10/06/2018 and10/05/2018).CT biopsyof the right lower lobelung masson 10/31/2018 was performed for mutational studies.  He is s/p cycle #1 Alimta and pembrolizumabon 11/03/2018.He iss/p5cycles ofpembrolizumabalone (11/24/2018-02/15/2019).  Chest CTon 02/12/2019 revealed interval improvement in the patient's malignancy. The primary mass was3.9 x 3.6 cm (previously 5.7 x 7.5 cm). The right pleural nodularityhadimproved insize andnumberof lesions(nodularity abutting right 5th rib was1.5 x 0.5 cm; previously 2.9 x 1.4 cm). There were sclerotic changes in multiple right-sided ribsc/wtreated metastatic disease. There was no right hilar adenopathy. The right pleural effusion was smaller. There was severe emphysematous changes in the lungs. There was no metastatic disease below the diaphragm.   Bone scanon 02/14/2019 revealed a linear bandlike uptake along the inferior aspect of the L3 vertebral body. This would not be typical for metastatic disease and may be related to compression fracture. Lumbar spine MRI  could be  used to further evaluate. There was radiotracer accumulation in the lateral right fifth and seventh ribsc/wknown metastatic disease as characterized on previous chest CT and PET-CT. There was no unexpected uptake in the bony pelvis.  Lumbar spine MRIon 02/15/2019 revealed amild inferior endplate compression fracture of L3with vertebral body height loss of up to approximately 30% has an appearance most c/wa senile osteoporotic injury.There was no evidencefor metastatic disease. There was partial visualization of a right pleural effusion. The patient had a right effusion on the prior chest CT.There was an approximately 3.6 cm abdominal aortic aneurysm(chronic).He underwent L3 kyphoplastyon 02/20/2019.  He received the influenza vaccineon 10/13/2018.  Symptomatically, he is feeling better today.  He was started on a prednisone pulse on 03/07/2019.  Plan: 1.   Labs today:  CBC with diff, CMP, TSH. 2.   Stage IV adenocarcinoma of the lung Patient iss/p cycle #1 Alimta and pembrolizumab. Patient is s/p5cycles ofpembrolizumab alone(last01/21/2021). Clinically,he is doing well except for a lumbar compression fracture unrelated to malignancy. Chest CT on 01/18/2021revealed improvement in pimary massis3.9 x 3.6 cm (previously 5.7 x 7.5 cm).  Right pleural nodularityhadimproved insize andnumberof lesions. Sclerotic changes in multiple right-sided ribsc/wtreated metastatic disease.  No right hilar adenopathy.Right pleural effusion was smaller. Bone scan on 01/20/2021revealed linear bandlike uptake along the inferior aspect of the L3 vertebral body. Activityin the lateral right fifth and seventh ribs c/wknown metastatic disease.  Labs reviewed and adequate.  Discuss need to postpone treatment secondary to  current steroids. 3. Bone metastasis Patienthasright lower ribmetastasis seen on chest CT. Bone scan on 02/14/2019 revealed only lateral right 5th and 7th ribs c/w metastatic disease/ Xgeva currently on hold. 4. Back pain, improving Etiology secondary to compression fracture. Lumbar spine MRI on 02/15/2019 personally reviewed.  Agree with radiology interpretation.   Mild endplate compression fracture of L3 with 30% vertebral height loss. Patient seen by orthopedics.  He is currently on steroids. 5.Cancer-related pain Pain remains well controlled.  Continue oxycodone 5 mg po q 4 hours prn pain. 6.Renal insufficiency Creatinine1.01today. Baseline creatinine 0.69 - 0.87 in past 1 month. Continue to monitor. 7. Hypokalemia Potassiumis 3.2 today. Patientiscurrently taking potassium chloride 10 meq BID. Increase potassium to 10 meq TID.  Continue to monitor. 8.   No treatment today as patient on steroids. 9.   RTC in 1 week for MD assessment, labs (CBC with diff, CMP), and cycle #6 pembrolizumab.  I discussed the assessment and treatment plan with the patient.  The patient was provided an opportunity to ask questions and all were answered.  The patient agreed with the plan and demonstrated an understanding of the instructions.  The patient was advised to call back if the symptoms worsen or if the condition fails to improve as anticipated.   Lequita Asal, MD, PhD    03/08/2019, 9:56 AM  I, Selena Batten, am acting as scribe for [employer]. Mike Gip, MD, PhD.  I, Keyshaun Exley C. Mike Gip, MD, have reviewed the above documentation for accuracy and completeness, and I agree with the above.

## 2019-03-07 ENCOUNTER — Other Ambulatory Visit: Payer: Self-pay

## 2019-03-07 DIAGNOSIS — C801 Malignant (primary) neoplasm, unspecified: Secondary | ICD-10-CM

## 2019-03-07 NOTE — Progress Notes (Signed)
Confirmed Name and DOB. Assessment completed with assistance from wife, [name]. Denies any concerns.

## 2019-03-08 ENCOUNTER — Other Ambulatory Visit: Payer: Self-pay

## 2019-03-08 ENCOUNTER — Inpatient Hospital Stay (HOSPITAL_BASED_OUTPATIENT_CLINIC_OR_DEPARTMENT_OTHER): Payer: Medicare HMO | Admitting: Hematology and Oncology

## 2019-03-08 ENCOUNTER — Inpatient Hospital Stay: Payer: Medicare HMO

## 2019-03-08 ENCOUNTER — Encounter: Payer: Self-pay | Admitting: Hematology and Oncology

## 2019-03-08 ENCOUNTER — Telehealth: Payer: Self-pay

## 2019-03-08 VITALS — BP 128/77 | HR 90 | Temp 96.0°F | Resp 18 | Ht 66.0 in | Wt 147.9 lb

## 2019-03-08 DIAGNOSIS — C3431 Malignant neoplasm of lower lobe, right bronchus or lung: Secondary | ICD-10-CM | POA: Diagnosis not present

## 2019-03-08 DIAGNOSIS — S32030D Wedge compression fracture of third lumbar vertebra, subsequent encounter for fracture with routine healing: Secondary | ICD-10-CM

## 2019-03-08 DIAGNOSIS — E876 Hypokalemia: Secondary | ICD-10-CM

## 2019-03-08 DIAGNOSIS — C7951 Secondary malignant neoplasm of bone: Secondary | ICD-10-CM | POA: Diagnosis not present

## 2019-03-08 DIAGNOSIS — C801 Malignant (primary) neoplasm, unspecified: Secondary | ICD-10-CM

## 2019-03-08 DIAGNOSIS — G893 Neoplasm related pain (acute) (chronic): Secondary | ICD-10-CM

## 2019-03-08 DIAGNOSIS — Z5112 Encounter for antineoplastic immunotherapy: Secondary | ICD-10-CM | POA: Diagnosis not present

## 2019-03-08 LAB — CBC WITH DIFFERENTIAL/PLATELET
Abs Immature Granulocytes: 0.02 10*3/uL (ref 0.00–0.07)
Basophils Absolute: 0 10*3/uL (ref 0.0–0.1)
Basophils Relative: 0 %
Eosinophils Absolute: 0 10*3/uL (ref 0.0–0.5)
Eosinophils Relative: 0 %
HCT: 39.8 % (ref 39.0–52.0)
Hemoglobin: 13.5 g/dL (ref 13.0–17.0)
Immature Granulocytes: 0 %
Lymphocytes Relative: 17 %
Lymphs Abs: 1.2 10*3/uL (ref 0.7–4.0)
MCH: 31.8 pg (ref 26.0–34.0)
MCHC: 33.9 g/dL (ref 30.0–36.0)
MCV: 93.9 fL (ref 80.0–100.0)
Monocytes Absolute: 0.4 10*3/uL (ref 0.1–1.0)
Monocytes Relative: 6 %
Neutro Abs: 5.3 10*3/uL (ref 1.7–7.7)
Neutrophils Relative %: 77 %
Platelets: 155 10*3/uL (ref 150–400)
RBC: 4.24 MIL/uL (ref 4.22–5.81)
RDW: 12.7 % (ref 11.5–15.5)
WBC: 6.9 10*3/uL (ref 4.0–10.5)
nRBC: 0 % (ref 0.0–0.2)

## 2019-03-08 LAB — COMPREHENSIVE METABOLIC PANEL
ALT: 15 U/L (ref 0–44)
AST: 24 U/L (ref 15–41)
Albumin: 3.8 g/dL (ref 3.5–5.0)
Alkaline Phosphatase: 73 U/L (ref 38–126)
Anion gap: 8 (ref 5–15)
BUN: 22 mg/dL (ref 8–23)
CO2: 29 mmol/L (ref 22–32)
Calcium: 9.6 mg/dL (ref 8.9–10.3)
Chloride: 95 mmol/L — ABNORMAL LOW (ref 98–111)
Creatinine, Ser: 1.01 mg/dL (ref 0.61–1.24)
GFR calc Af Amer: >60 mL/min (ref >60)
GFR calc non Af Amer: >60 mL/min (ref >60)
Glucose, Bld: 254 mg/dL — ABNORMAL HIGH (ref 70–99)
Potassium: 3.2 mmol/L — ABNORMAL LOW (ref 3.5–5.1)
Sodium: 132 mmol/L — ABNORMAL LOW (ref 135–145)
Total Bilirubin: 0.7 mg/dL (ref 0.3–1.2)
Total Protein: 7.5 g/dL (ref 6.5–8.1)

## 2019-03-08 LAB — TSH: TSH: 1.198 u[IU]/mL (ref 0.350–4.500)

## 2019-03-08 MED ORDER — HEPARIN SOD (PORK) LOCK FLUSH 100 UNIT/ML IV SOLN
500.0000 [IU] | Freq: Once | INTRAVENOUS | Status: AC
  Administered 2019-03-08: 10:00:00 500 [IU] via INTRAVENOUS
  Filled 2019-03-08: qty 5

## 2019-03-08 MED ORDER — SODIUM CHLORIDE 0.9% FLUSH
10.0000 mL | INTRAVENOUS | Status: DC | PRN
  Administered 2019-03-08: 09:00:00 10 mL via INTRAVENOUS
  Filled 2019-03-08: qty 10

## 2019-03-08 NOTE — Telephone Encounter (Signed)
-----   Message from Lequita Asal, MD sent at 03/08/2019 12:39 PM EST ----- Regarding: Please call patient.  Potassium is 3.2.  Is he taking his oral potassium?  If so, how much and how often as the dose will need to be increased.  M ----- Message ----- From: Buel Ream, [facility] [place] Sent: 03/08/2019   9:31 AM EST To: Lequita Asal, MD

## 2019-03-08 NOTE — Telephone Encounter (Signed)
Per Dr Mike Gip she wanted to know how much potassium is the patient taking . His wife states he is currently taking 10 meq BID. Per Dr Mike Gip she would like for the patient to take an extra 20 meq today and I extra pill x 2 days. Ms Andres Hebert was understanding and agreeable.

## 2019-03-13 NOTE — Progress Notes (Signed)
[facility]  [address] [place], [place] [address] Phone: [phone]  Fax: [phone]   Telemedicine Office Visit:  03/15/2019  Referring physician: [name], MD  I connected with Andres Hebert on 03/15/2019 at 10:10 AM by videoconferencing and verified that I was speaking with the correct person using 2 identifiers.  The patient was at home.  I discussed the limitations, risk, security and privacy concerns of performing an evaluation and management service by videoconferencing and the availability of in person appointments.  I also discussed with the patient that there may be a patient responsible charge related to this service.  The patient expressed understanding and agreed to proceed.   Chief Complaint: Andres Hebert is a 86 y.o. male with stage IV adenocarcinoma of therightlungand non-pathologic L3 compression fracture s/p kyphoplasty who is seen for assessment prior to cycle #5 pembrolizumab.    HPI: The patient was last seen in the medical oncology clinic on 03/08/2019. At that time, his back was feeling better.  He had seen orthopedics who had put him on a 6 day steroid burst.  Steroids started on 03/07/2019.  He had taken 60 mg prednisone on 03/07/2019 and 50 mg on 03/08/2019.  Last dose was scheduled for 03/12/2019. Labs included a hematocrit 39.8, hemoglobin 13.5, platelets 155,000, WBC 6,900. Sodium 132. Potassium was 3.2. TSH 1.198. Potassium was increased to 20 meq x 2 days then back to 20 meq a day.  Pembrolizumab was held secondary to ongoing steroids.  During the interim, he has felt "weak and tired".  He completed his steroids on 03/12/2019. His wife notes that he does not want to get up much. She notes that he gets agitated.  She notes he can't get settled which is not related to his pain. He is up and down at night.  He will follow-up with Altha Harm, NP.   His back pain is better but he continues to have some  discomfort. His wife notes that his bowels are good. He is eating well. He had no breakdown on his bottom. His wife notes that his treatment is tomorrow at 11:15 AM.  The weather may be bad tomorrow;  I encouraged them to not come in tomorrow if the roads are icy and reschedule treatment for Monday, 03/19/2019.  They are using gas logs to stay warm.     Past Medical History:  Diagnosis Date  . BPV (benign positional vertigo)   . Cancer ([place])    lung cancer stage IV  . Chronic constipation   . Collapsed lung 1959   right  . Diabetes mellitus without complication (HCC)    borderline  . History of hiatal hernia   . Hyperlipidemia   . Hypertension     Past Surgical History:  Procedure Laterality Date  . CATARACT EXTRACTION, BILATERAL    . COLONOSCOPY    . ESOPHAGOGASTRODUODENOSCOPY    . EYE SURGERY    . KYPHOPLASTY N/A 02/20/2019   Procedure: L3 KYPHOPLASTY;  Surgeon: Hessie Knows, MD;  Location: [facility] ORS;  Service: Orthopedics;  Laterality: N/A;  . PORTA CATH INSERTION N/A 11/27/2018   Procedure: PORTA CATH INSERTION;  Surgeon: Algernon Huxley, MD;  Location: [facility];  Service: Cardiovascular;  Laterality: N/A;    Family History  Problem Relation Age of Onset  . Stroke Mother   . Epilepsy Mother   . Prostate cancer Father     Social History:  reports that he quit smoking about  45 years ago. His smoking use included cigarettes. He has a 25.00 pack-year smoking history. He has never used smokeless tobacco. He reports previous alcohol use. He reports that he does not use drugs. He stoppedsmoking about 45 years ago.He denies any exposure to asbestos, radiation or toxins.He worked for [employer] and [place] lives in [place].His wife's name isSheila. The patient is accompanied by [name] today.  Participants in the patient's visit and their role in the encounter included the patient, [name] and Vito Berger, CMA, today.  The intake visit was provided by Vito Berger,  CMA.   Allergies:  Allergies  Allergen Reactions  . Accupril [Quinapril Hcl] Other (See Comments)    dizziness per MR    Current Medications: Current Outpatient Medications  Medication Sig Dispense Refill  . amLODipine (NORVASC) 2.5 MG tablet Take 2.5 mg by mouth daily.    Marland Kitchen aspirin EC 81 MG tablet Take 81 mg by mouth daily.    . citalopram (CELEXA) 10 MG tablet Take 10 mg by mouth daily.     . feeding supplement, ENSURE ENLIVE, (ENSURE ENLIVE) LIQD Take 237 mLs by mouth 3 (three) times daily between meals. (Patient taking differently: Take 237 mLs by mouth 4 (four) times a week. ) 237 mL 0  . oxyCODONE (OXY IR/ROXICODONE) 5 MG immediate release tablet Take 1 tablet every 4 hours as needed for pain.  Keep a pain diary. 40 tablet 0  . polyethylene glycol (MIRALAX / GLYCOLAX) 17 g packet Take 17 g by mouth 2 (two) times daily. (Patient taking differently: Take 17 g by mouth daily as needed for mild constipation. ) 14 each 0  . potassium chloride (K-DUR,KLOR-CON) 10 MEQ tablet Take 10 mEq by mouth 2 (two) times daily.    . pravastatin (PRAVACHOL) 40 MG tablet Take 40 mg by mouth daily.    Marland Kitchen senna (SENOKOT) 8.6 MG TABS tablet Take 1 tablet (8.6 mg total) by mouth daily as needed for mild constipation. (Patient taking differently: Take 8.6-17.2 mg by mouth daily as needed for mild constipation. ) 30 tablet 0  . traZODone (DESYREL) 50 MG tablet Take 100 mg by mouth at bedtime.     . triamterene-hydrochlorothiazide (MAXZIDE-25) 37.5-25 MG tablet Take 1 tablet by mouth daily.    Marland Kitchen lidocaine-prilocaine (EMLA) cream Apply 1 application topically as needed. (Patient not taking: Reported on 03/14/2019) 30 g 1  . ondansetron (ZOFRAN) 8 MG tablet Take 1 tablet (8 mg total) by mouth 2 (two) times daily as needed (Nausea or vomiting). Start if needed on the third day after chemotherapy. (Patient not taking: Reported on 02/27/2019) 30 tablet 1  . OXYGEN Inhale into the lungs as needed.    . predniSONE  (DELTASONE) 10 MG tablet Take 10 mg by mouth daily. 6 Tablets Today, 5 Tablets Tomorrow (03/08/2019)     No current facility-administered medications for this visit.    Review of Systems  Constitutional: Positive for malaise/fatigue. Negative for chills, diaphoresis, fever and weight loss (no new weight).       Feels "weak and tired". His wife assist him with ADLs.  HENT: Negative for congestion, hearing loss, nosebleeds, sinus pain and sore throat.        Clear/white nasal secretions.  Eyes: Negative.  Negative for blurred vision, double vision and photophobia.  Respiratory: Negative for cough (chronic), hemoptysis, sputum production and shortness of breath.   Cardiovascular: Negative.  Negative for chest pain, palpitations and leg swelling.  Gastrointestinal: Negative for abdominal pain, blood in stool,  constipation (on stool softeners; suppositories; MiraLax), diarrhea, heartburn, melena, nausea and vomiting.       Eating well. Drinking Ensure. Bowels are better.   Genitourinary: Negative.  Negative for dysuria, flank pain, frequency, hematuria and urgency.  Musculoskeletal: Positive for back pain (L3 discomfort- better). Negative for falls, joint pain, myalgias and neck pain.  Skin: Negative for itching and rash.  Neurological: Positive for weakness (generalized). Negative for dizziness, tingling, sensory change, speech change, focal weakness and headaches.  Endo/Heme/Allergies: Negative.  Does not bruise/bleed easily.  Psychiatric/Behavioral: Positive for memory loss. Negative for depression. The patient has insomnia. The patient is not nervous/anxious.   All other systems reviewed and are negative.  Performance status (ECOG):  2-3  Vitals There were no vitals taken for this visit.   Physical Exam  Constitutional: He is oriented to person, place, and time. He appears well-developed and well-nourished. No distress.  HENT:  Head: Normocephalic and atraumatic.  Gray hair and beard.   Eyes: Conjunctivae and EOM are normal. No scleral icterus.  Glasses.  Blue eyes.  Neurological: He is alert and oriented to person, place, and time.  Skin: He is not diaphoretic.  Psychiatric: He has a normal mood and affect. His behavior is normal. Judgment and thought content normal.  Nursing note reviewed.   No visits with results within 3 Day(s) from this visit.  Latest known visit with results is:  Infusion on 03/08/2019  Component Date Value Ref Range Status  . TSH 03/08/2019 1.198  0.350 - 4.500 uIU/mL Final   Comment: Performed by a 3rd Generation assay with a functional sensitivity of <=0.01 uIU/mL. Performed at [facility], [address], [place], [place] [address]   . Sodium 03/08/2019 132* 135 - 145 mmol/L Final  . Potassium 03/08/2019 3.2* 3.5 - 5.1 mmol/L Final  . Chloride 03/08/2019 95* 98 - 111 mmol/L Final  . CO2 03/08/2019 29  22 - 32 mmol/L Final  . Glucose, Bld 03/08/2019 254* 70 - 99 mg/dL Final  . BUN 03/08/2019 22  8 - 23 mg/dL Final  . Creatinine, Ser 03/08/2019 1.01  0.61 - 1.24 mg/dL Final  . Calcium 03/08/2019 9.6  8.9 - 10.3 mg/dL Final  . Total Protein 03/08/2019 7.5  6.5 - 8.1 g/dL Final  . Albumin 03/08/2019 3.8  3.5 - 5.0 g/dL Final  . AST 03/08/2019 24  15 - 41 U/L Final  . ALT 03/08/2019 15  0 - 44 U/L Final  . Alkaline Phosphatase 03/08/2019 73  38 - 126 U/L Final  . Total Bilirubin 03/08/2019 0.7  0.3 - 1.2 mg/dL Final  . GFR calc non Af Amer 03/08/2019 >60  >60 mL/min Final  . GFR calc Af Amer 03/08/2019 >60  >60 mL/min Final  . Anion gap 03/08/2019 8  5 - 15 Final   Performed at [facility], [address], [place], [place] [address]  . WBC 03/08/2019 6.9  4.0 - 10.5 K/uL Final  . RBC 03/08/2019 4.24  4.22 - 5.81 MIL/uL Final  . Hemoglobin 03/08/2019 13.5  13.0 - 17.0 g/dL Final  . HCT 03/08/2019 39.8  39.0 - 52.0 % Final  . MCV 03/08/2019 93.9  80.0 - 100.0 fL Final  . MCH 03/08/2019 31.8  26.0 - 34.0 pg Final   . MCHC 03/08/2019 33.9  30.0 - 36.0 g/dL Final  . RDW 03/08/2019 12.7  11.5 - 15.5 % Final  . Platelets 03/08/2019 155  150 - 400 K/uL Final  .  nRBC 03/08/2019 0.0  0.0 - 0.2 % Final  . Neutrophils Relative % 03/08/2019 77  % Final  . Neutro Abs 03/08/2019 5.3  1.7 - 7.7 K/uL Final  . Lymphocytes Relative 03/08/2019 17  % Final  . Lymphs Abs 03/08/2019 1.2  0.7 - 4.0 K/uL Final  . Monocytes Relative 03/08/2019 6  % Final  . Monocytes Absolute 03/08/2019 0.4  0.1 - 1.0 K/uL Final  . Eosinophils Relative 03/08/2019 0  % Final  . Eosinophils Absolute 03/08/2019 0.0  0.0 - 0.5 K/uL Final  . Basophils Relative 03/08/2019 0  % Final  . Basophils Absolute 03/08/2019 0.0  0.0 - 0.1 K/uL Final  . Immature Granulocytes 03/08/2019 0  % Final  . Abs Immature Granulocytes 03/08/2019 0.02  0.00 - 0.07 K/uL Final   Performed at [facility], [address] [place], [place] [address]    Assessment:  Andres Hebert is a 86 y.o. male with metastaticadenocarcinoma of thelungs/p right sided thoracentesis on 10/06/2018.Cytologyconfirmed non-small cell carcinoma, favor adenocarcinoma of the lung.He has a 25-30 pack year smoking history.He has clinical stageT4NxM1b.  PD-L1 testing was 80% on 11/09/2018. Foundation Onetesting revealednumber mutational burden (TMB)18 Muts/Mb, MSI-stable, ATM splice site [id number]>W, CDKN2A loss, CDKN2B loss, DNMT3A Y533C, FGF10 amplification, KRAS G12C, MTAP loss, and RBM10 A459f*216.  Chest CT angiogramon 10/05/2018 revealed a 8.4 x 6.9 cm right lower lobe mass with numerous pleural nodules and a large right pleural effusion. There was bony destruction of the right lateral 7th and 8th ribs. There was no pulmonary embolism.  Head MRIon 10/07/2018 revealed no metastatic disease or acute intracranial abnormality.There was distal left vertebral artery with poor flow or occlusion, most likely due to atherosclerosis.  PET scanon 10/18/2018  revealed a large hypermetabolic RIGHT lower lobe mass consistent with bronchogenic carcinoma. There was multifocal pleural metastasis within the RIGHT hemithorax. There was one pleural metastatic lesion invading adjacent RIGHT seventh rib, and moderate RIGHT effusion. There was probable RIGHT hilar metastatic adenopathy, and no mediastinal or supraclavicular metastatic adenopathy.   He is s/pthoracentesisx 2 (10/06/2018 and10/05/2018).CT biopsyof the right lower lobelung masson 10/31/2018 was performed for mutational studies.  He is s/p cycle #1 Alimta and pembrolizumabon 11/03/2018.He iss/p5cycles ofpembrolizumabalone (11/24/2018-02/15/2019).  Chest CTon 02/12/2019 revealed interval improvement in the patient's malignancy. The primary mass was3.9 x 3.6 cm (previously 5.7 x 7.5 cm). The right pleural nodularityhadimproved insize andnumberof lesions(nodularity abutting right 5th rib was1.5 x 0.5 cm; previously 2.9 x 1.4 cm). There were sclerotic changes in multiple right-sided ribsc/wtreated metastatic disease. There was no right hilar adenopathy. The right pleural effusion was smaller. There was severe emphysematous changes in the lungs. There was no metastatic disease below the diaphragm.   Bone scanon 02/14/2019 revealed a linear bandlike uptake along the inferior aspect of the L3 vertebral body. This would not be typical for metastatic disease and may be related to compression fracture. Lumbar spine MRI could be used to further evaluate. There was radiotracer accumulation in the lateral right fifth and seventh ribsc/wknown metastatic disease as characterized on previous chest CT and PET-CT. There was no unexpected uptake in the bony pelvis.  Lumbar spine MRIon 02/15/2019 revealed amild inferior endplate compression fracture of L3with vertebral body height loss of up to approximately 30% has an appearance most c/wa senile osteoporotic injury.There was no  evidencefor metastatic disease. There was partial visualization of a right pleural effusion. The patient had a right effusion on the prior chest CT.There was an approximately 3.6 cm abdominal  aortic aneurysm(chronic).He underwent L3 kyphoplastyon 02/20/2019.  He received the influenza vaccineon 10/13/2018.  Symptomatically, he remains weak.  He denies any significant back pain.  He has completed his steroid burst per orthopedics.  Plan: 1.   Labs on 03/16/2019:  CBC with diff, CMP, Mg. 2.   Stage IV adenocarcinoma of the lung Patient iss/p cycle #1 Alimta and pembrolizumab. Patient is s/p5cycles ofpembrolizumab alone(last01/21/2021). Clinically,he is doing well regarding his malignancy. Chest CT on 01/18/2021revealed improvement in pimary massis3.9 x 3.6 cm (previously 5.7 x 7.5 cm).  Right pleural nodularityhadimproved insize andnumberof lesions. Sclerotic changes in multiple right-sided ribsc/wtreated metastatic disease.  No right hilar adenopathy.Right pleural effusion was smaller. Bone scan on 01/20/2021revealed linear bandlike uptake along the inferior aspect of the L3 vertebral body. Activityin the lateral right fifth and seventh ribs c/wknown metastatic disease. Labs will be reviewed tomorrow.  Anticipate cycle #6 pembrolizumab tomorrow. 3. Bone metastasis Patienthasright lower ribmetastasis seen on chest CT. Bone scan on 02/14/2019 revealed only lateral right 5th and 7th ribs c/w metastatic disease. Xgeva currently on hold. 4. Back pain, improved Etiology felt secondary to non-pathologic compression fracture. Lumbar spine MRI on 02/15/2019 revealed compression fracture of L3 only.  Patient completed steroid pulse per  orthopedics. 5.Cancer-related pain Painremains well controlled on oxycodone 5 mg po q 4 hours prn pain.  Continue to monitor. 6.Renal insufficiency Creatinine1.01on 03/08/2019. Baseline creatinine 0.69 - 0.87 in past 1 month. Continue to monitor. 7. Hypokalemia Potassiumwas 3.2 last week. Patient on oral potassium.  Continue to monitor and adjust potassium supplementation. 8.   RTC tomorrow for labs and pembrolizumab (already scheduled). 9.   Palliative Care consult with Altha Harm, NP. 10.   RTC 3 weeks after treatment for MD assessment, labs (CBC with diff, CMP, Mg, TSH), and cycle #7 pembrolizumab.   I discussed the assessment and treatment plan with the patient.  The patient was provided an opportunity to ask questions and all were answered.  The patient agreed with the plan and demonstrated an understanding of the instructions.  The patient was advised to call back if the symptoms worsen or if the condition fails to improve as anticipated.   Lequita Asal, MD, PhD    03/15/2019, 10:10 AM  I, Selena Batten, am acting as scribe for [employer]. Mike Gip, MD, PhD.  I, Jorah Hua C. Mike Gip, MD, have reviewed the above documentation for accuracy and completeness, and I agree with the above.

## 2019-03-14 ENCOUNTER — Encounter: Payer: Self-pay | Admitting: Hematology and Oncology

## 2019-03-14 NOTE — Progress Notes (Signed)
No new changes noted today. The patient Name, DOB and all medication has been verified by phone today.

## 2019-03-15 ENCOUNTER — Inpatient Hospital Stay: Payer: Medicare HMO

## 2019-03-15 ENCOUNTER — Inpatient Hospital Stay (HOSPITAL_BASED_OUTPATIENT_CLINIC_OR_DEPARTMENT_OTHER): Payer: Medicare HMO | Admitting: Hematology and Oncology

## 2019-03-15 ENCOUNTER — Encounter: Payer: Self-pay | Admitting: Hematology and Oncology

## 2019-03-15 DIAGNOSIS — C3431 Malignant neoplasm of lower lobe, right bronchus or lung: Secondary | ICD-10-CM

## 2019-03-15 DIAGNOSIS — E876 Hypokalemia: Secondary | ICD-10-CM

## 2019-03-15 DIAGNOSIS — C7951 Secondary malignant neoplasm of bone: Secondary | ICD-10-CM | POA: Diagnosis not present

## 2019-03-15 DIAGNOSIS — S32030D Wedge compression fracture of third lumbar vertebra, subsequent encounter for fracture with routine healing: Secondary | ICD-10-CM | POA: Diagnosis not present

## 2019-03-15 DIAGNOSIS — G893 Neoplasm related pain (acute) (chronic): Secondary | ICD-10-CM | POA: Diagnosis not present

## 2019-03-15 DIAGNOSIS — Z7189 Other specified counseling: Secondary | ICD-10-CM

## 2019-03-16 ENCOUNTER — Other Ambulatory Visit: Payer: Self-pay

## 2019-03-16 ENCOUNTER — Ambulatory Visit: Payer: Medicare HMO | Admitting: Hematology and Oncology

## 2019-03-16 ENCOUNTER — Inpatient Hospital Stay: Payer: Medicare HMO

## 2019-03-16 ENCOUNTER — Other Ambulatory Visit: Payer: Self-pay | Admitting: Hematology and Oncology

## 2019-03-16 VITALS — BP 108/70 | HR 62 | Temp 97.2°F | Resp 18

## 2019-03-16 DIAGNOSIS — Z5112 Encounter for antineoplastic immunotherapy: Secondary | ICD-10-CM | POA: Diagnosis not present

## 2019-03-16 DIAGNOSIS — C7951 Secondary malignant neoplasm of bone: Secondary | ICD-10-CM

## 2019-03-16 DIAGNOSIS — E876 Hypokalemia: Secondary | ICD-10-CM

## 2019-03-16 DIAGNOSIS — C3431 Malignant neoplasm of lower lobe, right bronchus or lung: Secondary | ICD-10-CM

## 2019-03-16 DIAGNOSIS — Z7189 Other specified counseling: Secondary | ICD-10-CM

## 2019-03-16 LAB — COMPREHENSIVE METABOLIC PANEL
ALT: 24 U/L (ref 0–44)
AST: 23 U/L (ref 15–41)
Albumin: 3.7 g/dL (ref 3.5–5.0)
Alkaline Phosphatase: 68 U/L (ref 38–126)
Anion gap: 7 (ref 5–15)
BUN: 25 mg/dL — ABNORMAL HIGH (ref 8–23)
CO2: 29 mmol/L (ref 22–32)
Calcium: 9 mg/dL (ref 8.9–10.3)
Chloride: 95 mmol/L — ABNORMAL LOW (ref 98–111)
Creatinine, Ser: 0.9 mg/dL (ref 0.61–1.24)
GFR calc Af Amer: >60 mL/min (ref >60)
GFR calc non Af Amer: >60 mL/min (ref >60)
Glucose, Bld: 136 mg/dL — ABNORMAL HIGH (ref 70–99)
Potassium: 3.8 mmol/L (ref 3.5–5.1)
Sodium: 131 mmol/L — ABNORMAL LOW (ref 135–145)
Total Bilirubin: 0.8 mg/dL (ref 0.3–1.2)
Total Protein: 7 g/dL (ref 6.5–8.1)

## 2019-03-16 LAB — CBC WITH DIFFERENTIAL/PLATELET
Abs Immature Granulocytes: 0.01 10*3/uL (ref 0.00–0.07)
Basophils Absolute: 0 10*3/uL (ref 0.0–0.1)
Basophils Relative: 0 %
Eosinophils Absolute: 0 10*3/uL (ref 0.0–0.5)
Eosinophils Relative: 0 %
HCT: 39.8 % (ref 39.0–52.0)
Hemoglobin: 13.2 g/dL (ref 13.0–17.0)
Immature Granulocytes: 0 %
Lymphocytes Relative: 19 %
Lymphs Abs: 1.2 10*3/uL (ref 0.7–4.0)
MCH: 32 pg (ref 26.0–34.0)
MCHC: 33.2 g/dL (ref 30.0–36.0)
MCV: 96.4 fL (ref 80.0–100.0)
Monocytes Absolute: 0.5 10*3/uL (ref 0.1–1.0)
Monocytes Relative: 7 %
Neutro Abs: 4.5 10*3/uL (ref 1.7–7.7)
Neutrophils Relative %: 74 %
Platelets: 128 10*3/uL — ABNORMAL LOW (ref 150–400)
RBC: 4.13 MIL/uL — ABNORMAL LOW (ref 4.22–5.81)
RDW: 13 % (ref 11.5–15.5)
WBC: 6.2 10*3/uL (ref 4.0–10.5)
nRBC: 0 % (ref 0.0–0.2)

## 2019-03-16 LAB — TSH: TSH: 1.526 u[IU]/mL (ref 0.350–4.500)

## 2019-03-16 LAB — MAGNESIUM: Magnesium: 2.2 mg/dL (ref 1.7–2.4)

## 2019-03-16 MED ORDER — SODIUM CHLORIDE 0.9 % IV SOLN
200.0000 mg | Freq: Once | INTRAVENOUS | Status: AC
  Administered 2019-03-16: 13:00:00 200 mg via INTRAVENOUS
  Filled 2019-03-16: qty 8

## 2019-03-16 MED ORDER — SODIUM CHLORIDE 0.9 % IV SOLN
Freq: Once | INTRAVENOUS | Status: AC
  Filled 2019-03-16: qty 250

## 2019-03-16 MED ORDER — HEPARIN SOD (PORK) LOCK FLUSH 100 UNIT/ML IV SOLN
500.0000 [IU] | Freq: Once | INTRAVENOUS | Status: AC
  Administered 2019-03-16: 500 [IU] via INTRAVENOUS
  Filled 2019-03-16: qty 5

## 2019-03-16 MED ORDER — SODIUM CHLORIDE 0.9% FLUSH
10.0000 mL | INTRAVENOUS | Status: DC | PRN
  Administered 2019-03-16: 12:00:00 10 mL via INTRAVENOUS
  Filled 2019-03-16: qty 10

## 2019-03-16 NOTE — Patient Instructions (Signed)
Pembrolizumab injection What is this medicine? PEMBROLIZUMAB (pem broe liz ue mab) is a monoclonal antibody. It is used to treat certain types of cancer. This medicine may be used for other purposes; ask your health care provider or pharmacist if you have questions. COMMON BRAND NAME(S): Keytruda What should I tell my health care provider before I take this medicine? They need to know if you have any of these conditions:  diabetes  immune system problems  inflammatory bowel disease  liver disease  lung or breathing disease  lupus  received or scheduled to receive an organ transplant or a stem-cell transplant that uses donor stem cells  an unusual or allergic reaction to pembrolizumab, other medicines, foods, dyes, or preservatives  pregnant or trying to get pregnant  breast-feeding How should I use this medicine? This medicine is for infusion into a vein. It is given by a health care professional in a hospital or clinic setting. A special MedGuide will be given to you before each treatment. Be sure to read this information carefully each time. Talk to your pediatrician regarding the use of this medicine in children. While this drug may be prescribed for children as young as 6 months for selected conditions, precautions do apply. Overdosage: If you think you have taken too much of this medicine contact a poison control center or emergency room at once. NOTE: This medicine is only for you. Do not share this medicine with others. What if I miss a dose? It is important not to miss your dose. Call your doctor or health care professional if you are unable to keep an appointment. What may interact with this medicine? Interactions have not been studied. Give your health care provider a list of all the medicines, herbs, non-prescription drugs, or dietary supplements you use. Also tell them if you smoke, drink alcohol, or use illegal drugs. Some items may interact with your medicine. This  list may not describe all possible interactions. Give your health care provider a list of all the medicines, herbs, non-prescription drugs, or dietary supplements you use. Also tell them if you smoke, drink alcohol, or use illegal drugs. Some items may interact with your medicine. What should I watch for while using this medicine? Your condition will be monitored carefully while you are receiving this medicine. You may need blood work done while you are taking this medicine. Do not become pregnant while taking this medicine or for 4 months after stopping it. Women should inform their doctor if they wish to become pregnant or think they might be pregnant. There is a potential for serious side effects to an unborn child. Talk to your health care professional or pharmacist for more information. Do not breast-feed an infant while taking this medicine or for 4 months after the last dose. What side effects may I notice from receiving this medicine? Side effects that you should report to your doctor or health care professional as soon as possible:  allergic reactions like skin rash, itching or hives, swelling of the face, lips, or tongue  bloody or black, tarry  breathing problems  changes in vision  chest pain  chills  confusion  constipation  cough  diarrhea  dizziness or feeling faint or lightheaded  fast or irregular heartbeat  fever  flushing  joint pain  low blood counts - this medicine may decrease the number of white blood cells, red blood cells and platelets. You may be at increased risk for infections and bleeding.  muscle pain  muscle   weakness  pain, tingling, numbness in the hands or feet  persistent headache  redness, blistering, peeling or loosening of the skin, including inside the mouth  signs and symptoms of high blood sugar such as dizziness; dry mouth; dry skin; fruity breath; nausea; stomach pain; increased hunger or thirst; increased urination  signs  and symptoms of kidney injury like trouble passing urine or change in the amount of urine  signs and symptoms of liver injury like dark urine, light-colored stools, loss of appetite, nausea, right upper belly pain, yellowing of the eyes or skin  sweating  swollen lymph nodes  weight loss Side effects that usually do not require medical attention (report to your doctor or health care professional if they continue or are bothersome):  decreased appetite  hair loss  muscle pain  tiredness This list may not describe all possible side effects. Call your doctor for medical advice about side effects. You may report side effects to FDA at 1-800-FDA-1088. Where should I keep my medicine? This drug is given in a hospital or clinic and will not be stored at home. NOTE: This sheet is a summary. It may not cover all possible information. If you have questions about this medicine, talk to your doctor, pharmacist, or health care provider.  2020 Elsevier/Gold Standard (2018-11-17 18:07:58)  

## 2019-03-19 ENCOUNTER — Inpatient Hospital Stay: Payer: Medicare HMO | Attending: Hospice and Palliative Medicine | Admitting: Hospice and Palliative Medicine

## 2019-03-19 DIAGNOSIS — F419 Anxiety disorder, unspecified: Secondary | ICD-10-CM

## 2019-03-19 DIAGNOSIS — G893 Neoplasm related pain (acute) (chronic): Secondary | ICD-10-CM | POA: Diagnosis not present

## 2019-03-19 DIAGNOSIS — C3431 Malignant neoplasm of lower lobe, right bronchus or lung: Secondary | ICD-10-CM | POA: Diagnosis not present

## 2019-03-19 DIAGNOSIS — Z515 Encounter for palliative care: Secondary | ICD-10-CM

## 2019-03-19 DIAGNOSIS — G4700 Insomnia, unspecified: Secondary | ICD-10-CM

## 2019-03-19 MED ORDER — CITALOPRAM HYDROBROMIDE 10 MG PO TABS: 10.0000 mg | ORAL_TABLET | Freq: Every day | ORAL | 0 refills | Status: DC

## 2019-03-19 NOTE — Progress Notes (Signed)
Virtual Visit via Telephone Note  I connected with Andres Hebert on 03/19/19 at  3:00 PM EST by telephone and verified that I am speaking with the correct person using two identifiers.   I discussed the limitations, risks, security and privacy concerns of performing an evaluation and management service by telephone and the availability of in person appointments. I also discussed with the patient that there may be a patient responsible charge related to this service. The patient expressed understanding and agreed to proceed.   History of Present Illness: Mr. Andres Hebert is an 86 y.o. male with multiple medical problems including mild cognitive impairment and stage IV adenocarcinoma of the lung with h/o malignant pleural effusion on treatment with Keytruda.  Palliative care was consulted to help address goals and manage ongoing symptoms.   Observations/Objective: I called and spoke with patient and wife by phone.  Patient reports that he has been feeling generalized anxiety over the past several weeks.  He has history of insomnia but is able to initiate sleep with use of trazodone 100 mg at bedtime and 10 mg of melatonin.  During the day, patient feels like he cannot sit still but does not really have the energy to "get up and go."  Patient denies depression.  He was previously treated on citalopram per the chart but neither patient nor wife recognized that medication.  It does not appear that he has had any refills since 2019.  Last known dose was 10 mg daily.  Of note, patient was recently started on prednisone due to chronic pain but that has been weaned given plan for ongoing treatment with immunotherapy.  Patient reports overall stable but chronic back pain.  Oral intake is reportedly adequate.  Wife is giving him supplements several times a day.  Functionally, patient is ambulatory with use of a cane.  He does have his wife to assist with care as needed.  Assessment and Plan: Stage  IV non-small cell lung cancer -on active treatment with Keytruda plus pemetrexed.  Followed by Dr. Mike Gip  Neoplasm related pain -history of kyphoplasty.  Stable with as needed oxycodone  Anxiety -we will restart citalopram 10 mg daily x1 week and then increase to 20 mg daily if tolerated  Insomnia -continue trazodone nightly  Follow Up Instructions: Follow-up telephone visit in 2 to 3 weeks   I discussed the assessment and treatment plan with the patient. The patient was provided an opportunity to ask questions and all were answered. The patient agreed with the plan and demonstrated an understanding of the instructions.   The patient was advised to call back or seek an in-person evaluation if the symptoms worsen or if the condition fails to improve as anticipated.  I provided 10 minutes of non-face-to-face time during this encounter.   Irean Hong, NP

## 2019-03-25 NOTE — Patient Instructions (Signed)
Follow up with orthopedics

## 2019-04-05 DIAGNOSIS — E876 Hypokalemia: Secondary | ICD-10-CM | POA: Insufficient documentation

## 2019-04-06 ENCOUNTER — Inpatient Hospital Stay: Payer: Medicare HMO | Attending: Nurse Practitioner | Admitting: Oncology

## 2019-04-06 ENCOUNTER — Inpatient Hospital Stay: Payer: Medicare HMO

## 2019-04-06 ENCOUNTER — Other Ambulatory Visit: Payer: Self-pay

## 2019-04-06 ENCOUNTER — Encounter: Payer: Self-pay | Admitting: Oncology

## 2019-04-06 VITALS — BP 123/77 | HR 63 | Resp 18

## 2019-04-06 VITALS — BP 110/56 | HR 65 | Temp 96.6°F | Resp 18 | Ht 66.0 in

## 2019-04-06 DIAGNOSIS — C3431 Malignant neoplasm of lower lobe, right bronchus or lung: Secondary | ICD-10-CM | POA: Diagnosis not present

## 2019-04-06 DIAGNOSIS — I10 Essential (primary) hypertension: Secondary | ICD-10-CM | POA: Insufficient documentation

## 2019-04-06 DIAGNOSIS — E119 Type 2 diabetes mellitus without complications: Secondary | ICD-10-CM | POA: Insufficient documentation

## 2019-04-06 DIAGNOSIS — E785 Hyperlipidemia, unspecified: Secondary | ICD-10-CM | POA: Insufficient documentation

## 2019-04-06 DIAGNOSIS — C7951 Secondary malignant neoplasm of bone: Secondary | ICD-10-CM | POA: Insufficient documentation

## 2019-04-06 DIAGNOSIS — Z823 Family history of stroke: Secondary | ICD-10-CM | POA: Insufficient documentation

## 2019-04-06 DIAGNOSIS — E876 Hypokalemia: Secondary | ICD-10-CM

## 2019-04-06 DIAGNOSIS — N289 Disorder of kidney and ureter, unspecified: Secondary | ICD-10-CM | POA: Insufficient documentation

## 2019-04-06 DIAGNOSIS — K5900 Constipation, unspecified: Secondary | ICD-10-CM | POA: Insufficient documentation

## 2019-04-06 DIAGNOSIS — R531 Weakness: Secondary | ICD-10-CM | POA: Insufficient documentation

## 2019-04-06 DIAGNOSIS — Z79899 Other long term (current) drug therapy: Secondary | ICD-10-CM | POA: Diagnosis not present

## 2019-04-06 DIAGNOSIS — Z8042 Family history of malignant neoplasm of prostate: Secondary | ICD-10-CM | POA: Insufficient documentation

## 2019-04-06 DIAGNOSIS — Z7952 Long term (current) use of systemic steroids: Secondary | ICD-10-CM | POA: Insufficient documentation

## 2019-04-06 DIAGNOSIS — I714 Abdominal aortic aneurysm, without rupture: Secondary | ICD-10-CM | POA: Diagnosis not present

## 2019-04-06 DIAGNOSIS — F419 Anxiety disorder, unspecified: Secondary | ICD-10-CM | POA: Diagnosis not present

## 2019-04-06 DIAGNOSIS — Z820 Family history of epilepsy and other diseases of the nervous system: Secondary | ICD-10-CM | POA: Insufficient documentation

## 2019-04-06 DIAGNOSIS — C782 Secondary malignant neoplasm of pleura: Secondary | ICD-10-CM | POA: Diagnosis not present

## 2019-04-06 DIAGNOSIS — S32030D Wedge compression fracture of third lumbar vertebra, subsequent encounter for fracture with routine healing: Secondary | ICD-10-CM

## 2019-04-06 DIAGNOSIS — Z5112 Encounter for antineoplastic immunotherapy: Secondary | ICD-10-CM | POA: Diagnosis not present

## 2019-04-06 DIAGNOSIS — M549 Dorsalgia, unspecified: Secondary | ICD-10-CM | POA: Diagnosis not present

## 2019-04-06 DIAGNOSIS — G893 Neoplasm related pain (acute) (chronic): Secondary | ICD-10-CM | POA: Insufficient documentation

## 2019-04-06 LAB — COMPREHENSIVE METABOLIC PANEL
ALT: 15 U/L (ref 0–44)
AST: 18 U/L (ref 15–41)
Albumin: 3.8 g/dL (ref 3.5–5.0)
Alkaline Phosphatase: 63 U/L (ref 38–126)
Anion gap: 12 (ref 5–15)
BUN: 23 mg/dL (ref 8–23)
CO2: 27 mmol/L (ref 22–32)
Calcium: 9.4 mg/dL (ref 8.9–10.3)
Chloride: 96 mmol/L — ABNORMAL LOW (ref 98–111)
Creatinine, Ser: 0.92 mg/dL (ref 0.61–1.24)
GFR calc Af Amer: >60 mL/min (ref >60)
GFR calc non Af Amer: >60 mL/min (ref >60)
Glucose, Bld: 114 mg/dL — ABNORMAL HIGH (ref 70–99)
Potassium: 3.4 mmol/L — ABNORMAL LOW (ref 3.5–5.1)
Sodium: 135 mmol/L (ref 135–145)
Total Bilirubin: 0.7 mg/dL (ref 0.3–1.2)
Total Protein: 7.2 g/dL (ref 6.5–8.1)

## 2019-04-06 LAB — CBC WITH DIFFERENTIAL/PLATELET
Abs Immature Granulocytes: 0.01 10*3/uL (ref 0.00–0.07)
Basophils Absolute: 0 10*3/uL (ref 0.0–0.1)
Basophils Relative: 0 %
Eosinophils Absolute: 0 10*3/uL (ref 0.0–0.5)
Eosinophils Relative: 1 %
HCT: 41.7 % (ref 39.0–52.0)
Hemoglobin: 13.6 g/dL (ref 13.0–17.0)
Immature Granulocytes: 0 %
Lymphocytes Relative: 29 %
Lymphs Abs: 1.3 10*3/uL (ref 0.7–4.0)
MCH: 31.3 pg (ref 26.0–34.0)
MCHC: 32.6 g/dL (ref 30.0–36.0)
MCV: 96.1 fL (ref 80.0–100.0)
Monocytes Absolute: 0.4 10*3/uL (ref 0.1–1.0)
Monocytes Relative: 10 %
Neutro Abs: 2.6 10*3/uL (ref 1.7–7.7)
Neutrophils Relative %: 60 %
Platelets: 141 10*3/uL — ABNORMAL LOW (ref 150–400)
RBC: 4.34 MIL/uL (ref 4.22–5.81)
RDW: 13.1 % (ref 11.5–15.5)
WBC: 4.4 10*3/uL (ref 4.0–10.5)
nRBC: 0 % (ref 0.0–0.2)

## 2019-04-06 LAB — MAGNESIUM: Magnesium: 2.1 mg/dL (ref 1.7–2.4)

## 2019-04-06 LAB — TSH: TSH: 1.79 u[IU]/mL (ref 0.350–4.500)

## 2019-04-06 MED ORDER — SODIUM CHLORIDE 0.9 % IV SOLN
200.0000 mg | Freq: Once | INTRAVENOUS | Status: AC
  Administered 2019-04-06: 200 mg via INTRAVENOUS
  Filled 2019-04-06: qty 8

## 2019-04-06 MED ORDER — SODIUM CHLORIDE 0.9% FLUSH
10.0000 mL | INTRAVENOUS | Status: DC | PRN
  Administered 2019-04-06: 10 mL via INTRAVENOUS
  Filled 2019-04-06: qty 10

## 2019-04-06 MED ORDER — SODIUM CHLORIDE 0.9 % IV SOLN
Freq: Once | INTRAVENOUS | Status: AC
  Filled 2019-04-06: qty 250

## 2019-04-06 MED ORDER — HEPARIN SOD (PORK) LOCK FLUSH 100 UNIT/ML IV SOLN
500.0000 [IU] | Freq: Once | INTRAVENOUS | Status: AC
  Administered 2019-04-06: 500 [IU] via INTRAVENOUS
  Filled 2019-04-06: qty 5

## 2019-04-06 NOTE — Patient Instructions (Signed)
Pembrolizumab injection What is this medicine? PEMBROLIZUMAB (pem broe liz ue mab) is a monoclonal antibody. It is used to treat certain types of cancer. This medicine may be used for other purposes; ask your health care provider or pharmacist if you have questions. COMMON BRAND NAME(S): Keytruda What should I tell my health care provider before I take this medicine? They need to know if you have any of these conditions:  diabetes  immune system problems  inflammatory bowel disease  liver disease  lung or breathing disease  lupus  received or scheduled to receive an organ transplant or a stem-cell transplant that uses donor stem cells  an unusual or allergic reaction to pembrolizumab, other medicines, foods, dyes, or preservatives  pregnant or trying to get pregnant  breast-feeding How should I use this medicine? This medicine is for infusion into a vein. It is given by a health care professional in a hospital or clinic setting. A special MedGuide will be given to you before each treatment. Be sure to read this information carefully each time. Talk to your pediatrician regarding the use of this medicine in children. While this drug may be prescribed for children as young as 6 months for selected conditions, precautions do apply. Overdosage: If you think you have taken too much of this medicine contact a poison control center or emergency room at once. NOTE: This medicine is only for you. Do not share this medicine with others. What if I miss a dose? It is important not to miss your dose. Call your doctor or health care professional if you are unable to keep an appointment. What may interact with this medicine? Interactions have not been studied. Give your health care provider a list of all the medicines, herbs, non-prescription drugs, or dietary supplements you use. Also tell them if you smoke, drink alcohol, or use illegal drugs. Some items may interact with your medicine. This  list may not describe all possible interactions. Give your health care provider a list of all the medicines, herbs, non-prescription drugs, or dietary supplements you use. Also tell them if you smoke, drink alcohol, or use illegal drugs. Some items may interact with your medicine. What should I watch for while using this medicine? Your condition will be monitored carefully while you are receiving this medicine. You may need blood work done while you are taking this medicine. Do not become pregnant while taking this medicine or for 4 months after stopping it. Women should inform their doctor if they wish to become pregnant or think they might be pregnant. There is a potential for serious side effects to an unborn child. Talk to your health care professional or pharmacist for more information. Do not breast-feed an infant while taking this medicine or for 4 months after the last dose. What side effects may I notice from receiving this medicine? Side effects that you should report to your doctor or health care professional as soon as possible:  allergic reactions like skin rash, itching or hives, swelling of the face, lips, or tongue  bloody or black, tarry  breathing problems  changes in vision  chest pain  chills  confusion  constipation  cough  diarrhea  dizziness or feeling faint or lightheaded  fast or irregular heartbeat  fever  flushing  joint pain  low blood counts - this medicine may decrease the number of white blood cells, red blood cells and platelets. You may be at increased risk for infections and bleeding.  muscle pain  muscle   weakness  pain, tingling, numbness in the hands or feet  persistent headache  redness, blistering, peeling or loosening of the skin, including inside the mouth  signs and symptoms of high blood sugar such as dizziness; dry mouth; dry skin; fruity breath; nausea; stomach pain; increased hunger or thirst; increased urination  signs  and symptoms of kidney injury like trouble passing urine or change in the amount of urine  signs and symptoms of liver injury like dark urine, light-colored stools, loss of appetite, nausea, right upper belly pain, yellowing of the eyes or skin  sweating  swollen lymph nodes  weight loss Side effects that usually do not require medical attention (report to your doctor or health care professional if they continue or are bothersome):  decreased appetite  hair loss  muscle pain  tiredness This list may not describe all possible side effects. Call your doctor for medical advice about side effects. You may report side effects to FDA at 1-800-FDA-1088. Where should I keep my medicine? This drug is given in a hospital or clinic and will not be stored at home. NOTE: This sheet is a summary. It may not cover all possible information. If you have questions about this medicine, talk to your doctor, pharmacist, or health care provider.  2020 Elsevier/Gold Standard (2018-11-17 18:07:58)  

## 2019-04-06 NOTE — Addendum Note (Signed)
Addended by: Vito Berger on: 04/06/2019 02:54 PM   Modules accepted: Orders

## 2019-04-06 NOTE — Progress Notes (Signed)
No new changes noted today 

## 2019-04-06 NOTE — Progress Notes (Signed)
[facility]  [address] [place], [place] [address] Phone: [phone]  Fax: [phone]   Telemedicine Office Visit:  04/06/2019  Referring physician: [name], MD  Chief Complaint: Andres Hebert is a 86 y.o. male with stage IV adenocarcinoma of therightlungand non-pathologic L3 compression fracture s/p kyphoplasty who is seen for assessment prior to cycle #7 pembrolizumab.    HPI: The patient was last seen in the medical oncology clinic on 03/15/2019.  His back pain continue to improve but still noted some discomfort.  His appetite remained stable.  He had recently completed a course of steroids for his back pain.  He received cycle 6 Keytruda.   He was evaluated by Billey Chang on 03/19/2019.  He was restarted on citalopram for anxiety.  During the interim, he has done "well".  He denies any new pain.  His anxiety appears improved since beginning citalopram.  He is sleeping better.  He is eating "okay".  Wife states "he eats when he wants to eat".  He continues to need help with most ADLs.  He denies any chest pain, shortness of breath or abdominal pain.  He has constipation intermittently requiring suppositories. Last BM was yesterday.    Past Medical History:  Diagnosis Date  . BPV (benign positional vertigo)   . Cancer ([employer])    lung cancer stage IV  . Chronic constipation   . Collapsed lung 1959   right  . Diabetes mellitus without complication (HCC)    borderline  . History of hiatal hernia   . Hyperlipidemia   . Hypertension     Past Surgical History:  Procedure Laterality Date  . CATARACT EXTRACTION, BILATERAL    . COLONOSCOPY    . ESOPHAGOGASTRODUODENOSCOPY    . EYE SURGERY    . KYPHOPLASTY N/A 02/20/2019   Procedure: L3 KYPHOPLASTY;  Surgeon: Hessie Knows, MD;  Location: [facility] ORS;  Service: Orthopedics;  Laterality: N/A;  . PORTA CATH INSERTION N/A 11/27/2018   Procedure: PORTA CATH INSERTION;  Surgeon: Algernon Huxley, MD;  Location: [facility];  Service: Cardiovascular;  Laterality: N/A;    Family History  Problem Relation Age of Onset  . Stroke Mother   . Epilepsy Mother   . Prostate cancer Father     Social History:  reports that he quit smoking about 45 years ago. His smoking use included cigarettes. He has a 25.00 pack-year smoking history. He has never used smokeless tobacco. He reports previous alcohol use. He reports that he does not use drugs. He stoppedsmoking about 45 years ago.He denies any exposure to asbestos, radiation or toxins.He worked for [employer] and [place] lives in [place].His wife's name isSheila. The patient is accompanied by [name] today.  Participants in the patient's visit and their role in the encounter included the patient, [name] and Vito Berger, CMA, today.  The intake visit was provided by Vito Berger, CMA.   Allergies:  Allergies  Allergen Reactions  . Accupril [Quinapril Hcl] Other (See Comments)    dizziness per MR    Current Medications: Current Outpatient Medications  Medication Sig Dispense Refill  . amLODipine (NORVASC) 2.5 MG tablet Take 2.5 mg by mouth daily.    Marland Kitchen aspirin EC 81 MG tablet Take 81 mg by mouth daily.    . citalopram (CELEXA) 10 MG tablet Take 1 tablet (10 mg total) by mouth daily. May increase to two tablets (91m total) after one week if tolerating 45 tablet 0  . feeding  supplement, ENSURE ENLIVE, (ENSURE ENLIVE) LIQD Take 237 mLs by mouth 3 (three) times daily between meals. (Patient taking differently: Take 237 mLs by mouth 4 (four) times a week. ) 237 mL 0  . lidocaine-prilocaine (EMLA) cream Apply 1 application topically as needed. (Patient not taking: Reported on 03/14/2019) 30 g 1  . ondansetron (ZOFRAN) 8 MG tablet Take 1 tablet (8 mg total) by mouth 2 (two) times daily as needed (Nausea or vomiting). Start if needed on the third day after chemotherapy. (Patient not taking: Reported on 02/27/2019) 30 tablet 1  .  oxyCODONE (OXY IR/ROXICODONE) 5 MG immediate release tablet Take 1 tablet every 4 hours as needed for pain.  Keep a pain diary. 40 tablet 0  . OXYGEN Inhale into the lungs as needed.    . polyethylene glycol (MIRALAX / GLYCOLAX) 17 g packet Take 17 g by mouth 2 (two) times daily. (Patient taking differently: Take 17 g by mouth daily as needed for mild constipation. ) 14 each 0  . potassium chloride (K-DUR,KLOR-CON) 10 MEQ tablet Take 10 mEq by mouth 2 (two) times daily.    . pravastatin (PRAVACHOL) 40 MG tablet Take 40 mg by mouth daily.    . predniSONE (DELTASONE) 10 MG tablet Take 10 mg by mouth daily. 6 Tablets Today, 5 Tablets Tomorrow (03/08/2019)    . senna (SENOKOT) 8.6 MG TABS tablet Take 1 tablet (8.6 mg total) by mouth daily as needed for mild constipation. (Patient taking differently: Take 8.6-17.2 mg by mouth daily as needed for mild constipation. ) 30 tablet 0  . traZODone (DESYREL) 50 MG tablet Take 100 mg by mouth at bedtime.     . triamterene-hydrochlorothiazide (MAXZIDE-25) 37.5-25 MG tablet Take 1 tablet by mouth daily.     No current facility-administered medications for this visit.   Review of Systems  Constitutional: Positive for malaise/fatigue. Negative for chills, fever and weight loss.  HENT: Negative for congestion, ear pain and tinnitus.   Eyes: Negative.  Negative for blurred vision and double vision.  Respiratory: Negative.  Negative for cough, sputum production and shortness of breath.   Cardiovascular: Negative.  Negative for chest pain, palpitations and leg swelling.  Gastrointestinal: Positive for constipation. Negative for abdominal pain, diarrhea, nausea and vomiting.  Genitourinary: Negative for dysuria, frequency and urgency.  Musculoskeletal: Negative for back pain and falls.  Skin: Negative.  Negative for rash.  Neurological: Positive for weakness. Negative for headaches.  Endo/Heme/Allergies: Negative.  Does not bruise/bleed easily.   Psychiatric/Behavioral: Positive for memory loss. Negative for depression. The patient is nervous/anxious. The patient does not have insomnia.    Performance status (ECOG):  2-3  Vitals There were no vitals taken for this visit.   Physical Exam  Constitutional: He is oriented to person, place, and time. He appears well-developed and well-nourished. No distress.  HENT:  Head: Normocephalic and atraumatic.  Gray hair and beard.  Eyes: Conjunctivae and EOM are normal. No scleral icterus.  Glasses.  Blue eyes.  Neurological: He is alert and oriented to person, place, and time.  Skin: He is not diaphoretic.  Psychiatric: He has a normal mood and affect. His behavior is normal. Judgment and thought content normal.  Nursing note reviewed.   No visits with results within 3 Day(s) from this visit.  Latest known visit with results is:  Infusion on 03/16/2019  Component Date Value Ref Range Status  . WBC 03/16/2019 6.2  4.0 - 10.5 K/uL Final  . RBC 03/16/2019 4.13* 4.22 -  5.81 MIL/uL Final  . Hemoglobin 03/16/2019 13.2  13.0 - 17.0 g/dL Final  . HCT 03/16/2019 39.8  39.0 - 52.0 % Final  . MCV 03/16/2019 96.4  80.0 - 100.0 fL Final  . MCH 03/16/2019 32.0  26.0 - 34.0 pg Final  . MCHC 03/16/2019 33.2  30.0 - 36.0 g/dL Final  . RDW 03/16/2019 13.0  11.5 - 15.5 % Final  . Platelets 03/16/2019 128* 150 - 400 K/uL Final  . nRBC 03/16/2019 0.0  0.0 - 0.2 % Final  . Neutrophils Relative % 03/16/2019 74  % Final  . Neutro Abs 03/16/2019 4.5  1.7 - 7.7 K/uL Final  . Lymphocytes Relative 03/16/2019 19  % Final  . Lymphs Abs 03/16/2019 1.2  0.7 - 4.0 K/uL Final  . Monocytes Relative 03/16/2019 7  % Final  . Monocytes Absolute 03/16/2019 0.5  0.1 - 1.0 K/uL Final  . Eosinophils Relative 03/16/2019 0  % Final  . Eosinophils Absolute 03/16/2019 0.0  0.0 - 0.5 K/uL Final  . Basophils Relative 03/16/2019 0  % Final  . Basophils Absolute 03/16/2019 0.0  0.0 - 0.1 K/uL Final  . Immature Granulocytes  03/16/2019 0  % Final  . Abs Immature Granulocytes 03/16/2019 0.01  0.00 - 0.07 K/uL Final   Performed at [facility], [address], [place], [place] [address]  . Sodium 03/16/2019 131* 135 - 145 mmol/L Final  . Potassium 03/16/2019 3.8  3.5 - 5.1 mmol/L Final  . Chloride 03/16/2019 95* 98 - 111 mmol/L Final  . CO2 03/16/2019 29  22 - 32 mmol/L Final  . Glucose, Bld 03/16/2019 136* 70 - 99 mg/dL Final  . BUN 03/16/2019 25* 8 - 23 mg/dL Final  . Creatinine, Ser 03/16/2019 0.90  0.61 - 1.24 mg/dL Final  . Calcium 03/16/2019 9.0  8.9 - 10.3 mg/dL Final  . Total Protein 03/16/2019 7.0  6.5 - 8.1 g/dL Final  . Albumin 03/16/2019 3.7  3.5 - 5.0 g/dL Final  . AST 03/16/2019 23  15 - 41 U/L Final  . ALT 03/16/2019 24  0 - 44 U/L Final  . Alkaline Phosphatase 03/16/2019 68  38 - 126 U/L Final  . Total Bilirubin 03/16/2019 0.8  0.3 - 1.2 mg/dL Final  . GFR calc non Af Amer 03/16/2019 >60  >60 mL/min Final  . GFR calc Af Amer 03/16/2019 >60  >60 mL/min Final  . Anion gap 03/16/2019 7  5 - 15 Final   Performed at [facility], [address], [place], [place] [address]  . Magnesium 03/16/2019 2.2  1.7 - 2.4 mg/dL Final   Performed at [facility], [address], [place], [place] [address]  . TSH 03/16/2019 1.526  0.350 - 4.500 uIU/mL Final   Comment: Performed by a 3rd Generation assay with a functional sensitivity of <=0.01 uIU/mL. Performed at [facility], [address] [place],  [address]     Assessment:  Andres Hebert is a 86 y.o. male with metastaticadenocarcinoma of thelungs/p right sided thoracentesis on 10/06/2018.Cytologyconfirmed non-small cell carcinoma, favor adenocarcinoma of the lung.He has a 25-30 pack year smoking history.He has clinical stageT4NxM1b.  PD-L1 testing was 80% on 11/09/2018. Foundation Onetesting revealednumber mutational burden (TMB)18 Muts/Mb, MSI-stable, ATM splice site [id number]>O,  CDKN2A loss, CDKN2B loss, DNMT3A Y533C, FGF10 amplification, KRAS G12C, MTAP loss, and RBM10 A440f*216.  Chest CT angiogramon 10/05/2018 revealed a 8.4 x 6.9 cm right lower lobe mass with numerous pleural nodules  and a large right pleural effusion. There was bony destruction of the right lateral 7th and 8th ribs. There was no pulmonary embolism.  Head MRIon 10/07/2018 revealed no metastatic disease or acute intracranial abnormality.There was distal left vertebral artery with poor flow or occlusion, most likely due to atherosclerosis.  PET scanon 10/18/2018 revealed a large hypermetabolic RIGHT lower lobe mass consistent with bronchogenic carcinoma. There was multifocal pleural metastasis within the RIGHT hemithorax. There was one pleural metastatic lesion invading adjacent RIGHT seventh rib, and moderate RIGHT effusion. There was probable RIGHT hilar metastatic adenopathy, and no mediastinal or supraclavicular metastatic adenopathy.   He is s/pthoracentesisx 2 (10/06/2018 and10/05/2018).CT biopsyof the right lower lobelung masson 10/31/2018 was performed for mutational studies.  He is s/p cycle #1 Alimta and pembrolizumabon 11/03/2018.He iss/p5cycles ofpembrolizumabalone (11/24/2018-02/15/2019).  Chest CTon 02/12/2019 revealed interval improvement in the patient's malignancy. The primary mass was3.9 x 3.6 cm (previously 5.7 x 7.5 cm). The right pleural nodularityhadimproved insize andnumberof lesions(nodularity abutting right 5th rib was1.5 x 0.5 cm; previously 2.9 x 1.4 cm). There were sclerotic changes in multiple right-sided ribsc/wtreated metastatic disease. There was no right hilar adenopathy. The right pleural effusion was smaller. There was severe emphysematous changes in the lungs. There was no metastatic disease below the diaphragm.   Bone scanon 02/14/2019 revealed a linear bandlike uptake along the inferior aspect of the L3 vertebral body. This  would not be typical for metastatic disease and may be related to compression fracture. Lumbar spine MRI could be used to further evaluate. There was radiotracer accumulation in the lateral right fifth and seventh ribsc/wknown metastatic disease as characterized on previous chest CT and PET-CT. There was no unexpected uptake in the bony pelvis.  Lumbar spine MRIon 02/15/2019 revealed amild inferior endplate compression fracture of L3with vertebral body height loss of up to approximately 30% has an appearance most c/wa senile osteoporotic injury.There was no evidencefor metastatic disease. There was partial visualization of a right pleural effusion. The patient had a right effusion on the prior chest CT.There was an approximately 3.6 cm abdominal aortic aneurysm(chronic).He underwent L3 kyphoplastyon 02/20/2019.  He received the influenza vaccineon 10/13/2018.  Symptomatically, he remains weak.  His back pain has significantly improved.  He was started on citalopram and anxiety has improved.  Plan: 1.   Labs on 03/16/2019:  CBC with diff, CMP, Mg. 2.   Stage IV adenocarcinoma of the lung Patient iss/p cycle #1 Alimta and pembrolizumab. Patient is s/p6cycles ofpembrolizumab alone(last02/19/2021). Clinically,he is doing well regarding his malignancy. Chest CT on 01/18/2021revealed improvement in pimary massis3.9 x 3.6 cm (previously 5.7 x 7.5 cm).  Right pleural nodularityhadimproved insize andnumberof lesions. Sclerotic changes in multiple right-sided ribsc/wtreated metastatic disease.  No right hilar adenopathy.Right pleural effusion was smaller. Bone scan on 01/20/2021revealed linear bandlike uptake along the inferior aspect of the L3 vertebral body. Activityin the lateral right fifth and seventh ribs  c/wknown metastatic disease. Labs are adequate for treatment.  Proceed with cycle 7 pembrolizumab.  3. Bone metastasis Patienthasright lower ribmetastasis seen on chest CT. Bone scan on 02/14/2019 revealed only lateral right 5th and 7th ribs c/w metastatic disease. Xgeva currently on hold. 4. Back pain, improved Etiology felt secondary to non-pathologic compression fracture. Lumbar spine MRI on 02/15/2019 revealed compression fracture of L3 only.  Patient completed steroid pulse per orthopedics. 5.Cancer-related pain Painremains well controlled on oxycodone 5 mg po q 4 hours prn pain.  Continue to monitor. 6.Renal insufficiency Creatininereturned to baseline-0.92 Baseline creatinine 0.69 - 0.87 in past  1 month. Continue to monitor. 7. Hypokalemia Potassium3.4 today. Patient on oral potassium. Continue.   Continue to monitor and adjust potassium supplementation. 8.   Anxiety  Stable on citalopram 10 mg  Patient to be seen by Merrily Pew [facility]orders next week for follow-up/adjustment. 9.  Return to clinic as scheduled for follow-up with Vonna [facility] and palliative care. 10.  RTC 3 weeks after treatment for MD assessment, labs (CBC with diff, CMP, Mg, TSH), and cycle #8 pembrolizumab.   I discussed the assessment and treatment plan with the patient.  The patient was provided an opportunity to ask questions and all were answered.  The patient agreed with the plan and demonstrated an understanding of the instructions.  The patient was advised to call back if the symptoms worsen or if the condition fails to improve as anticipated.  Greater than 50% was spent in counseling and coordination of care with this patient including but not limited to discussion of the relevant topics above (See A&P) including, but not  limited to diagnosis and management of acute and chronic medical conditions.   Faythe Casa, NP 04/06/2019 11:13 AM

## 2019-04-09 ENCOUNTER — Inpatient Hospital Stay (HOSPITAL_BASED_OUTPATIENT_CLINIC_OR_DEPARTMENT_OTHER): Payer: Medicare HMO | Admitting: Hospice and Palliative Medicine

## 2019-04-09 DIAGNOSIS — Z515 Encounter for palliative care: Secondary | ICD-10-CM

## 2019-04-09 DIAGNOSIS — C3431 Malignant neoplasm of lower lobe, right bronchus or lung: Secondary | ICD-10-CM | POA: Diagnosis not present

## 2019-04-09 DIAGNOSIS — F32A Depression, unspecified: Secondary | ICD-10-CM

## 2019-04-09 DIAGNOSIS — F329 Major depressive disorder, single episode, unspecified: Secondary | ICD-10-CM | POA: Diagnosis not present

## 2019-04-09 NOTE — Progress Notes (Signed)
Virtual Visit via Telephone Note  I connected with Andres Hebert on 04/09/19 at  2:30 PM EDT by telephone and verified that I am speaking with the correct person using two identifiers.   I discussed the limitations, risks, security and privacy concerns of performing an evaluation and management service by telephone and the availability of in person appointments. I also discussed with the patient that there may be a patient responsible charge related to this service. The patient expressed understanding and agreed to proceed.   History of Present Illness: Andres Hebert is an 86 y.o. male with multiple medical problems including mild cognitive impairment and stage IV adenocarcinoma of the lung with h/o malignant pleural effusion on treatment with Keytruda.  Palliative care was consulted to help address goals and manage ongoing symptoms.   Observations/Objective: I called and spoke with patient and wife by phone.  Wife says the patient has had significant improvement with anxiety following increase in his citalopram.  However, she says that she tried giving him 2 of the 10 mg tablets to equal 20 mg dose and it caused him to be overly drowsy.  She was giving him that in the morning.  She says that she subsequently started giving him 10 mg twice a day and found that he tolerated it much better.  I explained that twice daily dosing of citalopram is atypical and that the dosing regimen is generally once a day.  Furthermore, I recommended that she give him 20 mg at bedtime to facilitate sleep.  Generally, drowsiness will resolve over time.  However, she stated it was her intent to continue the twice daily dosing as she feels he is tolerating it well.  Assessment and Plan: Stage IV non-small cell lung cancer -on active treatment with Keytruda plus pemetrexed.  Followed by Dr. Mike Gip  Neoplasm related pain -history of kyphoplasty.  Stable with as needed oxycodone  Anxiety -citalopram 20 mg  daily  Insomnia -continue trazodone nightly  Follow Up Instructions: Follow-up virtual visit in 1 to 2 months   I discussed the assessment and treatment plan with the patient. The patient was provided an opportunity to ask questions and all were answered. The patient agreed with the plan and demonstrated an understanding of the instructions.   The patient was advised to call back or seek an in-person evaluation if the symptoms worsen or if the condition fails to improve as anticipated.  I provided 10 minutes of non-face-to-face time during this encounter.   Irean Hong, NP

## 2019-04-20 NOTE — Progress Notes (Signed)
Pharmacist Chemotherapy Monitoring - Follow Up Assessment    I verify that I have reviewed each item in the below checklist:  . Regimen for the patient is scheduled for the appropriate day and plan matches scheduled date. Marland Kitchen Appropriate non-routine labs are ordered dependent on drug ordered. . If applicable, additional medications reviewed and ordered per protocol based on lifetime cumulative doses and/or treatment regimen.   Plan for follow-up and/or issues identified: No . I-vent associated with next due treatment: No . MD and/or nursing notified: No  Elan Mcelvain K 04/20/2019 8:28 AM

## 2019-04-25 NOTE — Progress Notes (Signed)
[facility]  [address], [address] [place], [place] [address] Phone: [phone]  Fax: [phone]   Office Visit:  04/27/2019  Referring physician: [name], MD  Chief Complaint: Andres Hebert is a 86 y.o. male with stage IV adenocarcinoma of therightlungand non-pathologic L3 compression fracture s/p kyphoplasty who is seen for assessment prior to cycle #8 pembrolizumab.    HPI: The patient was last seen in the medical oncology clinic on 04/06/2019 by Dema Severin, NP. At that time, he felt weak. Back pain had significantly improved. He was on citalopram for anxiety; symptoms had mproved. CBC revealed hematocrit 41.7, hemoglobin 13.6, MCV 96.1, platelets 141,000, WBC 4400, ANC 2600.  Potassium was 3.4. TSH 1.79.  He received cycle #7 pembrolizumab.  He had a visit with Altha Harm, NP via telephone on 04/09/2019.  Pain and anxiety were well controlled.  He states that his treatment went well.  He recently lost his son unexpectedly due to "a blood clot that moved to his brain".  He has lost weight because he has been very emotional lately and hasn't been eating as much.  He is sore from getting up and down repetitively at home with people coming to see him. He is no longer restless as he was a few months ago. He has still been having constipation and has been taking Miralax. He denies taking any steroids. His wife asks if he needs to continue the folic acid; he was instructed to stop.  He received his second COVID vaccine on 04/19/2019.   Past Medical History:  Diagnosis Date  . BPV (benign positional vertigo)   . Cancer ([place])    lung cancer stage IV  . Chronic constipation   . Collapsed lung 1959   right  . Diabetes mellitus without complication (HCC)    borderline  . History of hiatal hernia   . Hyperlipidemia   . Hypertension     Past Surgical History:  Procedure Laterality Date  . CATARACT EXTRACTION, BILATERAL    . COLONOSCOPY     . ESOPHAGOGASTRODUODENOSCOPY    . EYE SURGERY    . KYPHOPLASTY N/A 02/20/2019   Procedure: L3 KYPHOPLASTY;  Surgeon: Hessie Knows, MD;  Location: [facility] ORS;  Service: Orthopedics;  Laterality: N/A;  . PORTA CATH INSERTION N/A 11/27/2018   Procedure: PORTA CATH INSERTION;  Surgeon: Algernon Huxley, MD;  Location: [facility];  Service: Cardiovascular;  Laterality: N/A;    Family History  Problem Relation Age of Onset  . Stroke Mother   . Epilepsy Mother   . Prostate cancer Father     Social History:  reports that he quit smoking about 45 years ago. His smoking use included cigarettes. He has a 25.00 pack-year smoking history. He has never used smokeless tobacco. He reports previous alcohol use. He reports that he does not use drugs. He stoppedsmoking about 45 years ago.He denies any exposure to asbestos, radiation or toxins.He worked for [employer] and [place] recently lost his son.  He lives in [place].His wife's name isSheila. The patient is accompanied by his wife on the iPad today.   Allergies:  Allergies  Allergen Reactions  . Accupril [Quinapril Hcl] Other (See Comments)    dizziness per MR    Current Medications: Current Outpatient Medications  Medication Sig Dispense Refill  . amLODipine (NORVASC) 2.5 MG tablet Take 2.5 mg by mouth daily.    Marland Kitchen aspirin EC 81 MG tablet Take 81 mg by mouth daily.    Marland Kitchen  citalopram (CELEXA) 10 MG tablet Take 1 tablet (10 mg total) by mouth daily. May increase to two tablets (40m total) after one week if tolerating 45 tablet 0  . feeding supplement, ENSURE ENLIVE, (ENSURE ENLIVE) LIQD Take 237 mLs by mouth 3 (three) times daily between meals. (Patient taking differently: Take 237 mLs by mouth 4 (four) times a week. ) 237 mL 0  . oxyCODONE (OXY IR/ROXICODONE) 5 MG immediate release tablet Take 1 tablet every 4 hours as needed for pain.  Keep a pain diary. 40 tablet 0  . potassium chloride (K-DUR,KLOR-CON) 10 MEQ tablet Take 10 mEq by mouth 2  (two) times daily.    . pravastatin (PRAVACHOL) 40 MG tablet Take 40 mg by mouth daily.    . traZODone (DESYREL) 50 MG tablet Take 100 mg by mouth at bedtime.     . triamterene-hydrochlorothiazide (MAXZIDE-25) 37.5-25 MG tablet Take 1 tablet by mouth daily.    .Marland Kitchenlidocaine-prilocaine (EMLA) cream Apply 1 application topically as needed. (Patient not taking: Reported on 03/14/2019) 30 g 1  . ondansetron (ZOFRAN) 8 MG tablet Take 1 tablet (8 mg total) by mouth 2 (two) times daily as needed (Nausea or vomiting). Start if needed on the third day after chemotherapy. (Patient not taking: Reported on 02/27/2019) 30 tablet 1  . OXYGEN Inhale into the lungs as needed.    . polyethylene glycol (MIRALAX / GLYCOLAX) 17 g packet Take 17 g by mouth 2 (two) times daily. (Patient not taking: Reported on 04/27/2019) 14 each 0  . predniSONE (DELTASONE) 10 MG tablet Take 10 mg by mouth daily. 6 Tablets Today, 5 Tablets Tomorrow (03/08/2019)    . senna (SENOKOT) 8.6 MG TABS tablet Take 1 tablet (8.6 mg total) by mouth daily as needed for mild constipation. (Patient not taking: Reported on 04/27/2019) 30 tablet 0   No current facility-administered medications for this visit.   Facility-Administered Medications Ordered in Other Visits  Medication Dose Route Frequency Provider Last Rate Last Admin  . heparin lock flush 100 unit/mL  500 Units Intravenous Once Shila Kruczek C, MD      . sodium chloride flush (NS) 0.9 % injection 10 mL  10 mL Intravenous PRN CLequita Asal MD   10 mL at 04/27/19 01517   Review of Systems  Constitutional: Positive for malaise/fatigue and weight loss (down 11 lb after loss of his son). Negative for chills and fever.  HENT: Negative for congestion, ear pain, nosebleeds, sinus pain, sore throat and tinnitus.   Eyes: Negative.  Negative for blurred vision and double vision.  Respiratory: Negative.  Negative for cough, hemoptysis, sputum production and shortness of breath.    Cardiovascular: Negative.  Negative for chest pain, palpitations and leg swelling.  Gastrointestinal: Positive for constipation. Negative for abdominal pain, blood in stool, diarrhea, nausea and vomiting.       Eating poorly after loss of his son.  Genitourinary: Negative.  Negative for dysuria, frequency and urgency.  Musculoskeletal: Negative.  Negative for back pain, falls, joint pain, myalgias and neck pain.  Skin: Negative.  Negative for rash.  Neurological: Positive for weakness (generalized). Negative for tingling, tremors, sensory change, speech change, focal weakness and headaches.  Endo/Heme/Allergies: Negative.  Negative for polydipsia. Does not bruise/bleed easily.  Psychiatric/Behavioral: Positive for memory loss. Negative for depression. The patient is nervous/anxious. The patient does not have insomnia.    Performance status (ECOG):  2  Vitals Blood pressure 118/76, pulse 69, temperature 97.7 F (36.5 C), temperature  source Tympanic, resp. rate 18, height _0  (1.676 m), weight 136 lb 9.6 oz (62 kg), SpO2 94 %.   Physical Exam  Constitutional: He is oriented to person, place, and time. No distress. Face mask in place.  Thin gentleman sitting comfortably in a wheelchair.  HENT:  Head: Normocephalic and atraumatic.  Right Ear: Hearing normal.  Left Ear: Hearing normal.  Mouth/Throat: Oropharynx is clear and moist and mucous membranes are normal. No oral lesions.  Cap.  Gray hair and beard.  Eyes: Pupils are equal, round, and reactive to light. Conjunctivae and EOM are normal. No scleral icterus.  Glasses.  Blue eyes.  Neck: No JVD present.  Cardiovascular: Normal rate, regular rhythm and normal heart sounds. Exam reveals no gallop and no friction rub.  No murmur heard. Pulmonary/Chest: Effort normal and breath sounds normal. No respiratory distress. He has no wheezes. He has no rhonchi. He has no rales.  Abdominal: Soft. Normal appearance and bowel sounds are normal. He  exhibits no mass. There is no hepatosplenomegaly. There is no abdominal tenderness. There is no rebound and no guarding.  Musculoskeletal:        General: No tenderness or edema. Normal range of motion.     Cervical back: Normal range of motion and neck supple.  Lymphadenopathy:       Head (right side): No preauricular, no posterior auricular and no occipital adenopathy present.       Head (left side): No preauricular, no posterior auricular and no occipital adenopathy present.    He has no cervical adenopathy.       Right cervical: No superficial cervical adenopathy present.   He has no axillary adenopathy.       Right: No inguinal and no supraclavicular adenopathy present.       Left: No inguinal and no supraclavicular adenopathy present.  Neurological: He is alert and oriented to person, place, and time.  Skin: Skin is warm, dry and intact. No bruising, no lesion and no rash noted. He is not diaphoretic. No erythema. No pallor.  Psychiatric: He has a normal mood and affect. His behavior is normal. Judgment and thought content normal.  Nursing note and vitals reviewed.   Infusion on 04/27/2019  Component Date Value Ref Range Status  . WBC 04/27/2019 4.9  4.0 - 10.5 K/uL Final  . RBC 04/27/2019 4.29  4.22 - 5.81 MIL/uL Final  . Hemoglobin 04/27/2019 13.6  13.0 - 17.0 g/dL Final  . HCT 04/27/2019 41.1  39.0 - 52.0 % Final  . MCV 04/27/2019 95.8  80.0 - 100.0 fL Final  . MCH 04/27/2019 31.7  26.0 - 34.0 pg Final  . MCHC 04/27/2019 33.1  30.0 - 36.0 g/dL Final  . RDW 04/27/2019 13.3  11.5 - 15.5 % Final  . Platelets 04/27/2019 140* 150 - 400 K/uL Final   Comment: Immature Platelet Fraction may be clinically indicated, consider ordering this additional test [id number]   . nRBC 04/27/2019 0.0  0.0 - 0.2 % Final  . Neutrophils Relative % 04/27/2019 71  % Final  . Neutro Abs 04/27/2019 3.4  1.7 - 7.7 K/uL Final  . Lymphocytes Relative 04/27/2019 22  % Final  . Lymphs Abs 04/27/2019 1.1   0.7 - 4.0 K/uL Final  . Monocytes Relative 04/27/2019 7  % Final  . Monocytes Absolute 04/27/2019 0.4  0.1 - 1.0 K/uL Final  . Eosinophils Relative 04/27/2019 0  % Final  . Eosinophils Absolute 04/27/2019 0.0  0.0 - 0.5 K/uL  Final  . Basophils Relative 04/27/2019 0  % Final  . Basophils Absolute 04/27/2019 0.0  0.0 - 0.1 K/uL Final  . Immature Granulocytes 04/27/2019 0  % Final  . Abs Immature Granulocytes 04/27/2019 0.02  0.00 - 0.07 K/uL Final   Performed at [facility], [address] [place], [place] [address]  . Sodium 04/27/2019 135  135 - 145 mmol/L Final  . Potassium 04/27/2019 3.6  3.5 - 5.1 mmol/L Final  . Chloride 04/27/2019 95* 98 - 111 mmol/L Final  . CO2 04/27/2019 29  22 - 32 mmol/L Final  . Glucose, Bld 04/27/2019 113* 70 - 99 mg/dL Final   Glucose reference range applies only to samples taken after fasting for at least 8 hours.  . BUN 04/27/2019 18  8 - 23 mg/dL Final  . Creatinine, Ser 04/27/2019 0.82  0.61 - 1.24 mg/dL Final  . Calcium 04/27/2019 9.3  8.9 - 10.3 mg/dL Final  . Total Protein 04/27/2019 7.2  6.5 - 8.1 g/dL Final  . Albumin 04/27/2019 3.9  3.5 - 5.0 g/dL Final  . AST 04/27/2019 18  15 - 41 U/L Final  . ALT 04/27/2019 16  0 - 44 U/L Final  . Alkaline Phosphatase 04/27/2019 63  38 - 126 U/L Final  . Total Bilirubin 04/27/2019 0.8  0.3 - 1.2 mg/dL Final  . GFR calc non Af Amer 04/27/2019 >60  >60 mL/min Final  . GFR calc Af Amer 04/27/2019 >60  >60 mL/min Final  . Anion gap 04/27/2019 11  5 - 15 Final   Performed at [facility], [address], [place], [place] [address]  . Magnesium 04/27/2019 2.0  1.7 - 2.4 mg/dL Final   Performed at [facility], [address], [place], [place] [address]    Assessment:  Andres Hebert is a 86 y.o. male with metastaticadenocarcinoma of thelungs/p right sided thoracentesis on 10/06/2018.Cytologyconfirmed non-small cell carcinoma, favor adenocarcinoma of the lung.He  has a 25-30 pack year smoking history.He has clinical stageT4NxM1b.  PD-L1 testing was 80% on 11/09/2018. Foundation Onetesting revealednumber mutational burden (TMB)18 Muts/Mb, MSI-stable, ATM splice site 4481-8H>U, CDKN2A loss, CDKN2B loss, DNMT3A Y533C, FGF10 amplification, KRAS G12C, MTAP loss, and RBM10 A414f*216.  Chest CT angiogramon 10/05/2018 revealed a 8.4 x 6.9 cm right lower lobe mass with numerous pleural nodules and a large right pleural effusion. There was bony destruction of the right lateral 7th and 8th ribs. There was no pulmonary embolism.  Head MRIon 10/07/2018 revealed no metastatic disease or acute intracranial abnormality.There was distal left vertebral artery with poor flow or occlusion, most likely due to atherosclerosis.  PET scanon 10/18/2018 revealed a large hypermetabolic RIGHT lower lobe mass consistent with bronchogenic carcinoma. There was multifocal pleural metastasis within the RIGHT hemithorax. There was one pleural metastatic lesion invading adjacent RIGHT seventh rib, and moderate RIGHT effusion. There was probable RIGHT hilar metastatic adenopathy, and no mediastinal or supraclavicular metastatic adenopathy.   He is s/pthoracentesisx 2 (10/06/2018 and10/05/2018).CT biopsyof the right lower lobelung masson 10/31/2018 was performed for mutational studies.  He is s/p cycle #1 Alimta and pembrolizumabon 11/03/2018.He iss/p7cycles ofpembrolizumabalone (11/24/2018-04/06/2019).  Chest CTon 02/12/2019 revealed interval improvement in the patient's malignancy. The primary mass was3.9 x 3.6 cm (previously 5.7 x 7.5 cm). The right pleural nodularityhadimproved insize andnumberof lesions(nodularity abutting right 5th rib was1.5 x 0.5 cm; previously 2.9 x 1.4 cm). There were sclerotic changes in multiple right-sided ribsc/wtreated metastatic disease. There was no right hilar adenopathy.  The right pleural effusion was smaller.  There was severe emphysematous changes in the lungs. There was no metastatic disease below the diaphragm.   Bone scanon 02/14/2019 revealed a linear bandlike uptake along the inferior aspect of the L3 vertebral body. This would not be typical for metastatic disease and may be related to compression fracture. Lumbar spine MRI could be used to further evaluate. There was radiotracer accumulation in the lateral right fifth and seventh ribsc/wknown metastatic disease as characterized on previous chest CT and PET-CT. There was no unexpected uptake in the bony pelvis.  Lumbar spine MRIon 02/15/2019 revealed amild inferior endplate compression fracture of L3with vertebral body height loss of up to approximately 30% has an appearance most c/wa senile osteoporotic injury.There was no evidencefor metastatic disease. There was partial visualization of a right pleural effusion. The patient had a right effusion on the prior chest CT.There was an approximately 3.6 cm abdominal aortic aneurysm(chronic).He underwent L3 kyphoplastyon 02/20/2019.  He received the influenza vaccineon 10/13/2018.  He received his second COVID-19 vaccine on 04/19/2019.  Symptomatically, he is struggling after the recent death of his son.  He has lost 11 pounds.  He denies any shortness of breath or bone pain.  Exam is stable.  Plan: 1.   Labs today:  CBC with diff, CMP, Mg, TSH 2.   Stage IV adenocarcinoma of the lung Patient iss/p cycle #1 Alimta and pembrolizumab. Patient is s/p7cycles ofpembrolizumab alone(last03/12/2019). Clinically,he is doing well regarding his malignancy. Chest CT on 01/18/2021revealed improvement in the pimary mass, right pleural nodularity, and right pleural effusion.  Sclerotic changes in multiple right-sided ribsc/wtreated metastatic disease.   Bone scan on 01/20/2021revealed linear bandlike uptake along  the inferior aspect of the L3 vertebral body. Activityin the lateral right fifth and seventh ribs c/wknown metastatic disease. Labs were adequate for treatment.  Proceed with cycle #8 pembrolizumab.  Discuss plan for restaging studies and continuation of therapy if scans stable to improved. 3. Bone metastasis Patienthasright lower ribmetastasis seen on chest CT. Bone scan on 02/14/2019 revealed only lateral right 5th and 7th ribs c/w metastatic disease. Delton See currently remains on hold. 4.Cancer-related pain Pain remains well controlled on current regimen. 5. Hypokalemia Potassium3.6 today. Continue oral potassium supplementation. 6.   Anxiety and depression  Patient remains stable on citalopram.  Support provided patient with loss of his son. 7.   Cycle #8 pembrolizumab today.  8.   Schedule chest CT on 05/16/2019. 9.   RTC 3 weeks after treatment for MD assessment, labs (CBC with diff, CMP, Mg, TSH), review of chest CT, and cycle #9 pembrolizumab.  I discussed the assessment and treatment plan with the patient.  The patient was provided an opportunity to ask questions and all were answered.  The patient agreed with the plan and demonstrated an understanding of the instructions.  The patient was advised to call back if the symptoms worsen or if the condition fails to improve as anticipated.   Ogden Handlin C. Mike Gip, MD, PhD    04/27/2019, 9:59 AM  I, Heywood Footman, am acting as a Education administrator for Lequita Asal, MD.  I, Omer Mike Gip, MD, have reviewed the above documentation for accuracy and completeness, and I agree with the above.

## 2019-04-27 ENCOUNTER — Inpatient Hospital Stay: Payer: Medicare HMO

## 2019-04-27 ENCOUNTER — Inpatient Hospital Stay: Payer: Medicare HMO | Attending: Hematology and Oncology | Admitting: Hematology and Oncology

## 2019-04-27 ENCOUNTER — Other Ambulatory Visit: Payer: Self-pay

## 2019-04-27 ENCOUNTER — Encounter: Payer: Self-pay | Admitting: Hematology and Oncology

## 2019-04-27 VITALS — BP 126/77 | HR 66 | Resp 18

## 2019-04-27 VITALS — BP 118/76 | HR 69 | Temp 97.7°F | Resp 18 | Ht 66.0 in | Wt 136.6 lb

## 2019-04-27 DIAGNOSIS — I10 Essential (primary) hypertension: Secondary | ICD-10-CM | POA: Insufficient documentation

## 2019-04-27 DIAGNOSIS — M549 Dorsalgia, unspecified: Secondary | ICD-10-CM | POA: Insufficient documentation

## 2019-04-27 DIAGNOSIS — R634 Abnormal weight loss: Secondary | ICD-10-CM | POA: Insufficient documentation

## 2019-04-27 DIAGNOSIS — C7951 Secondary malignant neoplasm of bone: Secondary | ICD-10-CM

## 2019-04-27 DIAGNOSIS — F329 Major depressive disorder, single episode, unspecified: Secondary | ICD-10-CM | POA: Diagnosis not present

## 2019-04-27 DIAGNOSIS — E785 Hyperlipidemia, unspecified: Secondary | ICD-10-CM | POA: Diagnosis not present

## 2019-04-27 DIAGNOSIS — R05 Cough: Secondary | ICD-10-CM | POA: Insufficient documentation

## 2019-04-27 DIAGNOSIS — I2510 Atherosclerotic heart disease of native coronary artery without angina pectoris: Secondary | ICD-10-CM | POA: Insufficient documentation

## 2019-04-27 DIAGNOSIS — G893 Neoplasm related pain (acute) (chronic): Secondary | ICD-10-CM | POA: Insufficient documentation

## 2019-04-27 DIAGNOSIS — D7212 Drug rash with eosinophilia and systemic symptoms syndrome: Secondary | ICD-10-CM | POA: Diagnosis not present

## 2019-04-27 DIAGNOSIS — Z8042 Family history of malignant neoplasm of prostate: Secondary | ICD-10-CM | POA: Insufficient documentation

## 2019-04-27 DIAGNOSIS — C3431 Malignant neoplasm of lower lobe, right bronchus or lung: Secondary | ICD-10-CM

## 2019-04-27 DIAGNOSIS — J90 Pleural effusion, not elsewhere classified: Secondary | ICD-10-CM | POA: Insufficient documentation

## 2019-04-27 DIAGNOSIS — Z87891 Personal history of nicotine dependence: Secondary | ICD-10-CM | POA: Diagnosis not present

## 2019-04-27 DIAGNOSIS — E119 Type 2 diabetes mellitus without complications: Secondary | ICD-10-CM | POA: Diagnosis not present

## 2019-04-27 DIAGNOSIS — Z823 Family history of stroke: Secondary | ICD-10-CM | POA: Insufficient documentation

## 2019-04-27 DIAGNOSIS — Z79899 Other long term (current) drug therapy: Secondary | ICD-10-CM | POA: Insufficient documentation

## 2019-04-27 DIAGNOSIS — I700 Atherosclerosis of aorta: Secondary | ICD-10-CM | POA: Diagnosis not present

## 2019-04-27 DIAGNOSIS — Z820 Family history of epilepsy and other diseases of the nervous system: Secondary | ICD-10-CM | POA: Insufficient documentation

## 2019-04-27 DIAGNOSIS — I714 Abdominal aortic aneurysm, without rupture: Secondary | ICD-10-CM | POA: Diagnosis not present

## 2019-04-27 DIAGNOSIS — C782 Secondary malignant neoplasm of pleura: Secondary | ICD-10-CM | POA: Insufficient documentation

## 2019-04-27 DIAGNOSIS — R531 Weakness: Secondary | ICD-10-CM | POA: Diagnosis not present

## 2019-04-27 DIAGNOSIS — K5900 Constipation, unspecified: Secondary | ICD-10-CM | POA: Insufficient documentation

## 2019-04-27 DIAGNOSIS — Z5112 Encounter for antineoplastic immunotherapy: Secondary | ICD-10-CM | POA: Diagnosis present

## 2019-04-27 DIAGNOSIS — F419 Anxiety disorder, unspecified: Secondary | ICD-10-CM | POA: Diagnosis not present

## 2019-04-27 DIAGNOSIS — E876 Hypokalemia: Secondary | ICD-10-CM | POA: Insufficient documentation

## 2019-04-27 LAB — COMPREHENSIVE METABOLIC PANEL
ALT: 16 U/L (ref 0–44)
AST: 18 U/L (ref 15–41)
Albumin: 3.9 g/dL (ref 3.5–5.0)
Alkaline Phosphatase: 63 U/L (ref 38–126)
Anion gap: 11 (ref 5–15)
BUN: 18 mg/dL (ref 8–23)
CO2: 29 mmol/L (ref 22–32)
Calcium: 9.3 mg/dL (ref 8.9–10.3)
Chloride: 95 mmol/L — ABNORMAL LOW (ref 98–111)
Creatinine, Ser: 0.82 mg/dL (ref 0.61–1.24)
GFR calc Af Amer: >60 mL/min (ref >60)
GFR calc non Af Amer: >60 mL/min (ref >60)
Glucose, Bld: 113 mg/dL — ABNORMAL HIGH (ref 70–99)
Potassium: 3.6 mmol/L (ref 3.5–5.1)
Sodium: 135 mmol/L (ref 135–145)
Total Bilirubin: 0.8 mg/dL (ref 0.3–1.2)
Total Protein: 7.2 g/dL (ref 6.5–8.1)

## 2019-04-27 LAB — CBC WITH DIFFERENTIAL/PLATELET
Abs Immature Granulocytes: 0.02 10*3/uL (ref 0.00–0.07)
Basophils Absolute: 0 10*3/uL (ref 0.0–0.1)
Basophils Relative: 0 %
Eosinophils Absolute: 0 10*3/uL (ref 0.0–0.5)
Eosinophils Relative: 0 %
HCT: 41.1 % (ref 39.0–52.0)
Hemoglobin: 13.6 g/dL (ref 13.0–17.0)
Immature Granulocytes: 0 %
Lymphocytes Relative: 22 %
Lymphs Abs: 1.1 10*3/uL (ref 0.7–4.0)
MCH: 31.7 pg (ref 26.0–34.0)
MCHC: 33.1 g/dL (ref 30.0–36.0)
MCV: 95.8 fL (ref 80.0–100.0)
Monocytes Absolute: 0.4 10*3/uL (ref 0.1–1.0)
Monocytes Relative: 7 %
Neutro Abs: 3.4 10*3/uL (ref 1.7–7.7)
Neutrophils Relative %: 71 %
Platelets: 140 10*3/uL — ABNORMAL LOW (ref 150–400)
RBC: 4.29 MIL/uL (ref 4.22–5.81)
RDW: 13.3 % (ref 11.5–15.5)
WBC: 4.9 10*3/uL (ref 4.0–10.5)
nRBC: 0 % (ref 0.0–0.2)

## 2019-04-27 LAB — MAGNESIUM: Magnesium: 2 mg/dL (ref 1.7–2.4)

## 2019-04-27 LAB — TSH: TSH: 2.64 u[IU]/mL (ref 0.350–4.500)

## 2019-04-27 MED ORDER — HEPARIN SOD (PORK) LOCK FLUSH 100 UNIT/ML IV SOLN
500.0000 [IU] | Freq: Once | INTRAVENOUS | Status: AC
  Administered 2019-04-27: 500 [IU] via INTRAVENOUS
  Filled 2019-04-27: qty 5

## 2019-04-27 MED ORDER — SODIUM CHLORIDE 0.9 % IV SOLN
Freq: Once | INTRAVENOUS | Status: AC
  Filled 2019-04-27: qty 250

## 2019-04-27 MED ORDER — SODIUM CHLORIDE 0.9% FLUSH
10.0000 mL | INTRAVENOUS | Status: DC | PRN
  Administered 2019-04-27: 10 mL via INTRAVENOUS
  Filled 2019-04-27: qty 10

## 2019-04-27 MED ORDER — SODIUM CHLORIDE 0.9 % IV SOLN
200.0000 mg | Freq: Once | INTRAVENOUS | Status: AC
  Administered 2019-04-27: 200 mg via INTRAVENOUS
  Filled 2019-04-27: qty 8

## 2019-04-27 MED ORDER — SODIUM CHLORIDE 0.9 % IV SOLN
Freq: Once | INTRAVENOUS | Status: DC
  Filled 2019-04-27: qty 250

## 2019-04-27 NOTE — Patient Instructions (Signed)
Pembrolizumab injection What is this medicine? PEMBROLIZUMAB (pem broe liz ue mab) is a monoclonal antibody. It is used to treat certain types of cancer. This medicine may be used for other purposes; ask your health care provider or pharmacist if you have questions. COMMON BRAND NAME(S): Keytruda What should I tell my health care provider before I take this medicine? They need to know if you have any of these conditions:  diabetes  immune system problems  inflammatory bowel disease  liver disease  lung or breathing disease  lupus  received or scheduled to receive an organ transplant or a stem-cell transplant that uses donor stem cells  an unusual or allergic reaction to pembrolizumab, other medicines, foods, dyes, or preservatives  pregnant or trying to get pregnant  breast-feeding How should I use this medicine? This medicine is for infusion into a vein. It is given by a health care professional in a hospital or clinic setting. A special MedGuide will be given to you before each treatment. Be sure to read this information carefully each time. Talk to your pediatrician regarding the use of this medicine in children. While this drug may be prescribed for children as young as 6 months for selected conditions, precautions do apply. Overdosage: If you think you have taken too much of this medicine contact a poison control center or emergency room at once. NOTE: This medicine is only for you. Do not share this medicine with others. What if I miss a dose? It is important not to miss your dose. Call your doctor or health care professional if you are unable to keep an appointment. What may interact with this medicine? Interactions have not been studied. Give your health care provider a list of all the medicines, herbs, non-prescription drugs, or dietary supplements you use. Also tell them if you smoke, drink alcohol, or use illegal drugs. Some items may interact with your medicine. This  list may not describe all possible interactions. Give your health care provider a list of all the medicines, herbs, non-prescription drugs, or dietary supplements you use. Also tell them if you smoke, drink alcohol, or use illegal drugs. Some items may interact with your medicine. What should I watch for while using this medicine? Your condition will be monitored carefully while you are receiving this medicine. You may need blood work done while you are taking this medicine. Do not become pregnant while taking this medicine or for 4 months after stopping it. Women should inform their doctor if they wish to become pregnant or think they might be pregnant. There is a potential for serious side effects to an unborn child. Talk to your health care professional or pharmacist for more information. Do not breast-feed an infant while taking this medicine or for 4 months after the last dose. What side effects may I notice from receiving this medicine? Side effects that you should report to your doctor or health care professional as soon as possible:  allergic reactions like skin rash, itching or hives, swelling of the face, lips, or tongue  bloody or black, tarry  breathing problems  changes in vision  chest pain  chills  confusion  constipation  cough  diarrhea  dizziness or feeling faint or lightheaded  fast or irregular heartbeat  fever  flushing  joint pain  low blood counts - this medicine may decrease the number of white blood cells, red blood cells and platelets. You may be at increased risk for infections and bleeding.  muscle pain  muscle   weakness  pain, tingling, numbness in the hands or feet  persistent headache  redness, blistering, peeling or loosening of the skin, including inside the mouth  signs and symptoms of high blood sugar such as dizziness; dry mouth; dry skin; fruity breath; nausea; stomach pain; increased hunger or thirst; increased urination  signs  and symptoms of kidney injury like trouble passing urine or change in the amount of urine  signs and symptoms of liver injury like dark urine, light-colored stools, loss of appetite, nausea, right upper belly pain, yellowing of the eyes or skin  sweating  swollen lymph nodes  weight loss Side effects that usually do not require medical attention (report to your doctor or health care professional if they continue or are bothersome):  decreased appetite  hair loss  muscle pain  tiredness This list may not describe all possible side effects. Call your doctor for medical advice about side effects. You may report side effects to FDA at 1-800-FDA-1088. Where should I keep my medicine? This drug is given in a hospital or clinic and will not be stored at home. NOTE: This sheet is a summary. It may not cover all possible information. If you have questions about this medicine, talk to your doctor, pharmacist, or health care provider.  2020 Elsevier/Gold Standard (2018-11-17 18:07:58)  

## 2019-04-27 NOTE — Progress Notes (Signed)
Patient c/o just soreness to his back due to the moving back and forward handling son home going.

## 2019-05-11 NOTE — Progress Notes (Signed)
Pharmacist Chemotherapy Monitoring - Follow Up Assessment    I verify that I have reviewed each item in the below checklist:  . Regimen for the patient is scheduled for the appropriate day and plan matches scheduled date. Marland Kitchen Appropriate non-routine labs are ordered dependent on drug ordered. . If applicable, additional medications reviewed and ordered per protocol based on lifetime cumulative doses and/or treatment regimen.   Plan for follow-up and/or issues identified: No . I-vent associated with next due treatment: No . MD and/or nursing notified: No  Andres Hebert K 05/11/2019 11:41 AM

## 2019-05-15 ENCOUNTER — Ambulatory Visit
Admission: RE | Admit: 2019-05-15 | Discharge: 2019-05-15 | Disposition: A | Discharge status: Home / Self Care | Payer: Medicare HMO | Source: Ambulatory Visit | Attending: Hematology and Oncology | Admitting: Hematology and Oncology

## 2019-05-15 ENCOUNTER — Other Ambulatory Visit: Payer: Self-pay

## 2019-05-15 DIAGNOSIS — C3431 Malignant neoplasm of lower lobe, right bronchus or lung: Secondary | ICD-10-CM | POA: Diagnosis not present

## 2019-05-15 MED ORDER — IOHEXOL 300 MG/ML  SOLN
75.0000 mL | Freq: Once | INTRAMUSCULAR | Status: AC | PRN
  Administered 2019-05-15: 75 mL via INTRAVENOUS

## 2019-05-16 ENCOUNTER — Other Ambulatory Visit: Payer: Self-pay | Admitting: Hematology and Oncology

## 2019-05-16 NOTE — Progress Notes (Signed)
[facility]  [address], [address] [place], [place] [address] Phone: [phone]  Fax: [phone]   Office Visit:  05/18/2019  Referring physician: [name], MD  Chief Complaint: Andres Hebert is a 86 y.o. male with stage IV adenocarcinoma of therightlungand non-pathologic L3 compression fracture s/p kyphoplasty who is seen for review of interval chest CT and consideration of cycle #9 pembrolizumab.    HPI: The patient was last seen in the medical oncology clinic on 04/27/2019. At that time,  he was struggling after the recent death of his son. He had lost 11 pounds. He denied any shortness of breath or bone pain.  Exam was stable. Hematocrit was 41.1, hemoglobin 13.6, MCV 95.8, platelets 140,000, WBC 4900, ANC 3400.  TSH was 2.64.  He received cycle #8 pembrolizumab.  Chest CT on 05/15/2019 revealed slight interval decrease in size and fullness of a spiculated infrahilar mass of the right lower lobe, with extensive postobstructive consolidation of the right lung base and a small right pleural effusion. There was interval increase in size in multiple subpleural soft tissue nodules about the right lung c/w worsened pleural metastatic disease.  There were unchanged sclerotic rib lesions.  There was no evidence of new osseous metastatic disease. He has severe emphysema, coronary artery disease, and aortic atherosclerosis.  He has a partially imaged aneurysm of the infrarenal abdominal aorta in the included upper abdomen.  During the interim, he says he has been doing "ok" and that he "is here". His wife says his energy level is a little better and his breathing has improved. He no longer uses his oxygen at home. He coughs up mucus 3-4 times a day. He notes he has been eating twice a day. He has been drinking both Ensure and Boost once a day. He says he has to watch what he eats because he doesn't want to "over do it".  He requested Boost and Ensure samples. His  wife helps him get up and dress him.  He is no longer having issues with constipation. His back pain is still there, but manageable.   We discussed a mixed response on CT scan.  We discussed change in therapy from pembrolizumab to Alimta.  He agrees to start folic acid today. He would like to start treatment the week of 05/28/2019. His wife requests another prescription of Decadron since she is unsure if he has any left.    Past Medical History:  Diagnosis Date  . BPV (benign positional vertigo)   . Cancer ([place])    lung cancer stage IV  . Chronic constipation   . Collapsed lung 1959   right  . Diabetes mellitus without complication (HCC)    borderline  . History of hiatal hernia   . Hyperlipidemia   . Hypertension     Past Surgical History:  Procedure Laterality Date  . CATARACT EXTRACTION, BILATERAL    . COLONOSCOPY    . ESOPHAGOGASTRODUODENOSCOPY    . EYE SURGERY    . KYPHOPLASTY N/A 02/20/2019   Procedure: L3 KYPHOPLASTY;  Surgeon: Hessie Knows, MD;  Location: [facility] ORS;  Service: Orthopedics;  Laterality: N/A;  . PORTA CATH INSERTION N/A 11/27/2018   Procedure: PORTA CATH INSERTION;  Surgeon: Algernon Huxley, MD;  Location: [place] [facility];  Service: Cardiovascular;  Laterality: N/A;    Family History  Problem Relation Age of Onset  . Stroke Mother   . Epilepsy Mother   . Prostate cancer Father  Social History:  reports that he quit smoking about 45 years ago. His smoking use included cigarettes. He has a 25.00 pack-year smoking history. He has never used smokeless tobacco. He reports previous alcohol use. He reports that he does not use drugs. He stoppedsmoking about 45 years ago.He denies any exposure to asbestos, radiation or toxins.He worked for [employer] and [employer] recently lost his son.  He lives in [place].His wife's name isSheila. The patient is accompanied by his wife on the iPad today.  Allergies:  Allergies  Allergen Reactions  . Accupril [Quinapril  Hcl] Other (See Comments)    dizziness per MR    Current Medications: Current Outpatient Medications  Medication Sig Dispense Refill  . amLODipine (NORVASC) 2.5 MG tablet Take 2.5 mg by mouth daily.    Marland Kitchen aspirin EC 81 MG tablet Take 81 mg by mouth daily.    . citalopram (CELEXA) 10 MG tablet Take 1 tablet (10 mg total) by mouth daily. May increase to two tablets (28m total) after one week if tolerating 45 tablet 0  . feeding supplement, ENSURE ENLIVE, (ENSURE ENLIVE) LIQD Take 237 mLs by mouth 3 (three) times daily between meals. (Patient taking differently: Take 237 mLs by mouth 4 (four) times a week. ) 237 mL 0  . oxyCODONE (OXY IR/ROXICODONE) 5 MG immediate release tablet Take 1 tablet every 4 hours as needed for pain.  Keep a pain diary. 40 tablet 0  . OXYGEN Inhale into the lungs as needed.    . potassium chloride (K-DUR,KLOR-CON) 10 MEQ tablet Take 10 mEq by mouth 2 (two) times daily.    . pravastatin (PRAVACHOL) 40 MG tablet Take 40 mg by mouth daily.    . traZODone (DESYREL) 50 MG tablet Take 100 mg by mouth at bedtime.     . triamterene-hydrochlorothiazide (MAXZIDE-25) 37.5-25 MG tablet Take 1 tablet by mouth daily.    .Marland Kitchendexamethasone (DECADRON) 4 MG tablet Take 1 tab two times a day the day before Alimta chemo. Take 2 tabs the day after chemo, then take 2 tabs two times a day for 2 days. 30 tablet 1  . folic acid (FOLVITE) 1 MG tablet Take 1 tablet (1 mg total) by mouth daily. Start 5-7 days before Alimta chemotherapy. Continue until 21 days after Alimta completed. 100 tablet 3  . lidocaine-prilocaine (EMLA) cream Apply 1 application topically as needed. (Patient not taking: Reported on 03/14/2019) 30 g 1  . polyethylene glycol (MIRALAX / GLYCOLAX) 17 g packet Take 17 g by mouth 2 (two) times daily. (Patient not taking: Reported on 04/27/2019) 14 each 0  . senna (SENOKOT) 8.6 MG TABS tablet Take 1 tablet (8.6 mg total) by mouth daily as needed for mild constipation. (Patient not taking:  Reported on 04/27/2019) 30 tablet 0   No current facility-administered medications for this visit.    Review of Systems  Constitutional: Positive for malaise/fatigue and weight loss (down 11 lb after loss of his son). Negative for chills, diaphoresis and fever.       Energy level is a little better.  HENT: Negative for congestion, ear pain, hearing loss, nosebleeds, sinus pain, sore throat and tinnitus.   Eyes: Negative.  Negative for blurred vision and double vision.  Respiratory: Positive for cough (mucus 3-4 times a day). Negative for hemoptysis, sputum production and shortness of breath.   Cardiovascular: Negative.  Negative for chest pain, palpitations and leg swelling.  Gastrointestinal: Negative for abdominal pain, blood in stool, constipation, diarrhea, heartburn, melena, nausea and  vomiting.       Eating poorly after loss of his son.  Genitourinary: Negative.  Negative for dysuria, frequency and urgency.  Musculoskeletal: Positive for back pain. Negative for falls, joint pain, myalgias and neck pain.  Skin: Negative.  Negative for itching and rash.  Neurological: Positive for weakness (generalized). Negative for dizziness, tingling, tremors, sensory change, speech change, focal weakness and headaches.  Endo/Heme/Allergies: Negative.  Negative for polydipsia. Does not bruise/bleed easily.  Psychiatric/Behavioral: Positive for memory loss. Negative for depression. The patient is not nervous/anxious and does not have insomnia.    Performance status (ECOG): 2  Physical Exam  Constitutional: He is oriented to person, place, and time. He appears well-developed and well-nourished. No distress. Face mask in place.  Thin gentleman sitting comfortably in a wheelchair.  HENT:  Head: Normocephalic and atraumatic.  Mouth/Throat: Oropharynx is clear and moist and mucous membranes are normal. No oral lesions.  Cap.  Gray hair and beard.  Eyes: Pupils are equal, round, and reactive to light.  Conjunctivae and EOM are normal. Right eye exhibits no discharge. Left eye exhibits no discharge. No scleral icterus.  Glasses.  Blue eyes.  Neck: No JVD present.  Cardiovascular: Normal rate, regular rhythm and normal heart sounds. Exam reveals no gallop and no friction rub.  No murmur heard. Pulmonary/Chest: Effort normal and breath sounds normal. No respiratory distress. He has no wheezes. He has no rhonchi. He has no rales.  Abdominal: Soft. Normal appearance and bowel sounds are normal. He exhibits no distension and no mass. There is no hepatosplenomegaly. There is no abdominal tenderness. There is no rebound and no guarding.  Musculoskeletal:        General: No tenderness or edema. Normal range of motion.     Cervical back: Normal range of motion and neck supple.  Lymphadenopathy:       Head (right side): No preauricular, no posterior auricular and no occipital adenopathy present.       Head (left side): No preauricular, no posterior auricular and no occipital adenopathy present.    He has no cervical adenopathy.    He has no axillary adenopathy.       Right: No inguinal and no supraclavicular adenopathy present.       Left: No inguinal and no supraclavicular adenopathy present.  Neurological: He is alert and oriented to person, place, and time.  Skin: Skin is warm, dry and intact. No bruising, no lesion and no rash noted. He is not diaphoretic. No erythema. No pallor.  Psychiatric: He has a normal mood and affect. His behavior is normal. Judgment and thought content normal.  Nursing note and vitals reviewed.   Infusion on 05/18/2019  Component Date Value Ref Range Status  . TSH 05/18/2019 2.443  0.350 - 4.500 uIU/mL Final   Comment: Performed by a 3rd Generation assay with a functional sensitivity of <=0.01 uIU/mL. Performed at [facility], [address] [place], [place] [address]   . Magnesium 05/18/2019 1.9  1.7 - 2.4 mg/dL Final   Performed at [facility], [address] [place], [place] [address]  . Sodium 05/18/2019 135  135 - 145 mmol/L Final  . Potassium 05/18/2019 3.4* 3.5 - 5.1 mmol/L Final  . Chloride 05/18/2019 98  98 - 111 mmol/L Final  . CO2 05/18/2019 27  22 - 32 mmol/L Final  . Glucose, Bld 05/18/2019 113* 70 - 99 mg/dL Final   Glucose reference range applies only to samples taken after  fasting for at least 8 hours.  . BUN 05/18/2019 18  8 - 23 mg/dL Final  . Creatinine, Ser 05/18/2019 1.01  0.61 - 1.24 mg/dL Final  . Calcium 05/18/2019 9.5  8.9 - 10.3 mg/dL Final  . Total Protein 05/18/2019 7.4  6.5 - 8.1 g/dL Final  . Albumin 05/18/2019 4.2  3.5 - 5.0 g/dL Final  . AST 05/18/2019 18  15 - 41 U/L Final  . ALT 05/18/2019 15  0 - 44 U/L Final  . Alkaline Phosphatase 05/18/2019 57  38 - 126 U/L Final  . Total Bilirubin 05/18/2019 0.9  0.3 - 1.2 mg/dL Final  . GFR calc non Af Amer 05/18/2019 >60  >60 mL/min Final  . GFR calc Af Amer 05/18/2019 >60  >60 mL/min Final  . Anion gap 05/18/2019 10  5 - 15 Final   Performed at [facility], [address] [place], [place] [address]  . WBC 05/18/2019 4.1  4.0 - 10.5 K/uL Final  . RBC 05/18/2019 4.19* 4.22 - 5.81 MIL/uL Final  . Hemoglobin 05/18/2019 13.4  13.0 - 17.0 g/dL Final  . HCT 05/18/2019 40.0  39.0 - 52.0 % Final  . MCV 05/18/2019 95.5  80.0 - 100.0 fL Final  . MCH 05/18/2019 32.0  26.0 - 34.0 pg Final  . MCHC 05/18/2019 33.5  30.0 - 36.0 g/dL Final  . RDW 05/18/2019 13.4  11.5 - 15.5 % Final  . Platelets 05/18/2019 123* 150 - 400 K/uL Final  . nRBC 05/18/2019 0.0  0.0 - 0.2 % Final  . Neutrophils Relative % 05/18/2019 64  % Final  . Neutro Abs 05/18/2019 2.6  1.7 - 7.7 K/uL Final  . Lymphocytes Relative 05/18/2019 27  % Final  . Lymphs Abs 05/18/2019 1.1  0.7 - 4.0 K/uL Final  . Monocytes Relative 05/18/2019 8  % Final  . Monocytes Absolute 05/18/2019 0.3  0.1 - 1.0 K/uL Final  . Eosinophils Relative 05/18/2019 1  % Final  . Eosinophils Absolute  05/18/2019 0.1  0.0 - 0.5 K/uL Final  . Basophils Relative 05/18/2019 0  % Final  . Basophils Absolute 05/18/2019 0.0  0.0 - 0.1 K/uL Final  . Immature Granulocytes 05/18/2019 0  % Final  . Abs Immature Granulocytes 05/18/2019 0.01  0.00 - 0.07 K/uL Final   Performed at [facility], [address], [place], [place] [address]    Assessment:  Andres Hebert is a 86 y.o. male with metastaticadenocarcinoma of thelungs/p right sided thoracentesis on 10/06/2018.Cytologyconfirmed non-small cell carcinoma, favor adenocarcinoma of the lung.He has a 25-30 pack year smoking history.He has clinical stageT4NxM1b.  PD-L1 testing was 80% on 11/09/2018. Foundation Onetesting revealednumber mutational burden (TMB)18 Muts/Mb, MSI-stable, ATM splice site 0354-6F>K, CDKN2A loss, CDKN2B loss, DNMT3A Y533C, FGF10 amplification, KRAS G12C, MTAP loss, and RBM10 A417f*216.  Chest CT angiogramon 10/05/2018 revealed a 8.4 x 6.9 cm right lower lobe mass with numerous pleural nodules and a large right pleural effusion. There was bony destruction of the right lateral 7th and 8th ribs. There was no pulmonary embolism.  Head MRIon 10/07/2018 revealed no metastatic disease or acute intracranial abnormality.There was distal left vertebral artery with poor flow or occlusion, most likely due to atherosclerosis.  PET scanon 10/18/2018 revealed a large hypermetabolic RIGHT lower lobe mass consistent with bronchogenic carcinoma. There was multifocal pleural metastasis within the RIGHT hemithorax. There was one pleural metastatic lesion invading adjacent RIGHT seventh rib, and moderate RIGHT effusion. There was probable RIGHT hilar  metastatic adenopathy, and no mediastinal or supraclavicular metastatic adenopathy.   He is s/pthoracentesisx 2 (10/06/2018 and10/05/2018).CT biopsyof the right lower lobelung masson 10/31/2018 was performed for mutational studies.  He is s/p cycle #1  Alimta and pembrolizumabon 11/03/2018.He iss/p8cycles ofpembrolizumabalone (11/24/2018-04/27/2019).  Chest CTon 02/12/2019 revealed interval improvement in the patient's malignancy. The primary mass was3.9 x 3.6 cm (previously 5.7 x 7.5 cm). The right pleural nodularityhadimproved insize andnumberof lesions(nodularity abutting right 5th rib was1.5 x 0.5 cm; previously 2.9 x 1.4 cm). There were sclerotic changes in multiple right-sided ribsc/wtreated metastatic disease. There was no right hilar adenopathy. The right pleural effusion was smaller. There was severe emphysematous changes in the lungs. There was no metastatic disease below the diaphragm.   Bone scanon 02/14/2019 revealed a linear bandlike uptake along the inferior aspect of the L3 vertebral body. This would not be typical for metastatic disease and may be related to compression fracture. Lumbar spine MRI could be used to further evaluate. There was radiotracer accumulation in the lateral right fifth and seventh ribsc/wknown metastatic disease as characterized on previous chest CT and PET-CT. There was no unexpected uptake in the bony pelvis.  Lumbar spine MRIon 02/15/2019 revealed amild inferior endplate compression fracture of L3with vertebral body height loss of up to approximately 30% has an appearance most c/wa senile osteoporotic injury.There was no evidencefor metastatic disease. There was partial visualization of a right pleural effusion. The patient had a right effusion on the prior chest CT.There was an approximately 3.6 cm abdominal aortic aneurysm(chronic).He underwent L3 kyphoplastyon 02/20/2019.  Chest CT on 05/15/2019 revealed slight interval decrease in size and fullness of a spiculated infrahilar mass of the right lower lobe, with extensive postobstructive consolidation of the right lung base and a small right pleural effusion. There was interval increase in size in multiple subpleural  soft tissue nodules about the right lung c/w worsened pleural metastatic disease.  There were unchanged sclerotic rib lesions.   He received the influenza vaccineon 10/13/2018.  He received his second COVID-19 vaccine on 04/19/2019.  Symptomatically, he is feeling a little better.  He denies any respiratory symptoms. Pain is well controlled.  Exam is stable.   Plan: 1.   Labs today:  CBC with diff, CMP, Mg, TSH. 2.   Stage IV adenocarcinoma of the lung Clinically, he is doing well.  Patient iss/p 1 cycle of Alimta and pembrolizumab. Patient is s/p8cycles ofpembrolizumab alone(last04/02/2019). Chest CT on 01/18/2021revealed improvement in the pimary mass, right pleural nodularity, and right pleural effusion.  Sclerotic changes in multiple right-sided ribsc/wtreated metastatic disease.   Bone scan on 01/20/2021revealed linear bandlike uptake along the inferior aspect of the L3 vertebral body. Activityin the lateral right fifth and seventh ribs c/wknown metastatic disease. Chest CT on 04/27/2019 personally reviewed.  Agree with radiology interpretation.   He has a mixed response with slight decrease in the right infrahilar mass and increase in subpleural nodules in the right lung.  Discuss discontinuation of pembrolizumab.  Discuss initiation of Alimta.   Potential side effects reviewed.  Patient had received 1 cycle on 11/03/2018.   Begin folic acid 1 mg/day.   B12 injection today and every 9 weeks.   Remind patient of use of Decadron around time of treatment.    Rx provided today.  Preauth Alimta.  Discuss symptom management.  He has antiemetics and pain medications at home to use on a prn bases.  Interventions are adequate.    3. Bone metastasis Patienthasright lower ribmetastasis seen on chest CT.  Bone scan on 02/14/2019 revealed only lateral right  5th and 7th ribs c/w metastatic disease. Consider initiation of Xgeva at next visit. 4.Cancer-related pain Pain is well controlled. 5. Hypokalemia Potassium3.4 today. Continue oral potassium supplementation. 6.   Weight loss  Patient notes that he is now eating better.  He had a significant decline in intake with the loss of his son.  He is requesting Ensure and Boost today.  Continue to monitor. 7.   Present at tumor board on 05/24/2019. 8.   RTC on 05/28/2019 for MD assessment, labs (CBC with diff, CMP, Mg, CEA), and cycle #1 Alimta.  I discussed the assessment and treatment plan with the patient.  The patient was provided an opportunity to ask questions and all were answered.  The patient agreed with the plan and demonstrated an understanding of the instructions.  The patient was advised to call back if the symptoms worsen or if the condition fails to improve as anticipated.   Tara Wich C. Mike Gip, MD, PhD    05/18/2019, 9:51 AM  I, Heywood Footman, am acting as a Education administrator for Lequita Asal, MD.  I, [place] Mike Gip, MD, have reviewed the above documentation for accuracy and completeness, and I agree with the above.

## 2019-05-17 ENCOUNTER — Other Ambulatory Visit: Payer: Self-pay | Admitting: Hematology and Oncology

## 2019-05-17 DIAGNOSIS — C3431 Malignant neoplasm of lower lobe, right bronchus or lung: Secondary | ICD-10-CM

## 2019-05-17 NOTE — Progress Notes (Signed)
DISCONTINUE OFF PATHWAY REGIMEN - Non-Small Cell Lung   [id number]:Pembrolizumab 200 mg  IV D1 + Pemetrexed 500 mg/m2 IV D1 + Carboplatin AUC=5 IV D1 q21 Days:   A cycle is every 21 days:     Pembrolizumab      Pemetrexed      Carboplatin   **Always confirm dose/schedule in your pharmacy ordering system**  REASON: Disease Progression PRIOR TREATMENT: Off Pathway: Pembrolizumab 200 mg  IV D1 + Pemetrexed 500 mg/m2 IV D1 + Carboplatin AUC=5 IV D1 q21 Days TREATMENT RESPONSE: Partial Response (PR)  START ON PATHWAY REGIMEN - Non-Small Cell Lung     A cycle is every 21 days:     Pemetrexed   **Always confirm dose/schedule in your pharmacy ordering system**  Patient Characteristics: Stage IV Metastatic, Nonsquamous, Initial Chemotherapy/Immunotherapy, PS = 2, ALK Rearrangement Negative and EGFR Mutation Negative/Non-Sensitizing and ROS1 Rearrangement Negative and NTRK Gene Fusion-Negative and RET Gene Fusion-Negative, PD-L1  Expression Positive 1-49% (TPS) / Negative / Not Tested / Awaiting Test Results Therapeutic Status: Stage IV Metastatic Histology: Nonsquamous Cell ROS1 Rearrangement Status: Negative Other Mutations/Biomarkers: No Other Actionable Mutations NTRK Gene Fusion Status: Negative PD-L1 Expression Status: PD-L1 Positive 1-49% (TPS) Chemotherapy/Immunotherapy LOT: Initial Chemotherapy/Immunotherapy Molecular Targeted Therapy: Not Appropriate MET Exon 14 Mutation Status: Negative RET Gene Fusion Status: Negative ALK Rearrangement Status: Negative EGFR Mutation Status: Negative/Wild Type BRAF [id number] Mutation Status: Negative ECOG Performance Status: 2 Biomarker Assessment Status Confirmation: All Genomic Markers Negative or Only MET+ or BRAF+ Intent of Therapy: Non-Curative / Palliative Intent, Discussed with Patient

## 2019-05-17 NOTE — Progress Notes (Signed)
Confirmed Name and DOB. Assessment completed with assistance from wife. Denies any concerns.

## 2019-05-18 ENCOUNTER — Inpatient Hospital Stay (HOSPITAL_BASED_OUTPATIENT_CLINIC_OR_DEPARTMENT_OTHER): Payer: Medicare HMO | Admitting: Hematology and Oncology

## 2019-05-18 ENCOUNTER — Inpatient Hospital Stay: Payer: Medicare HMO

## 2019-05-18 ENCOUNTER — Other Ambulatory Visit: Payer: Self-pay

## 2019-05-18 ENCOUNTER — Encounter: Payer: Self-pay | Admitting: Hematology and Oncology

## 2019-05-18 VITALS — BP 115/72 | HR 75 | Temp 97.9°F | Resp 18 | Ht 66.0 in | Wt 135.0 lb

## 2019-05-18 DIAGNOSIS — Z7189 Other specified counseling: Secondary | ICD-10-CM

## 2019-05-18 DIAGNOSIS — C7951 Secondary malignant neoplasm of bone: Secondary | ICD-10-CM | POA: Diagnosis not present

## 2019-05-18 DIAGNOSIS — C3431 Malignant neoplasm of lower lobe, right bronchus or lung: Secondary | ICD-10-CM

## 2019-05-18 DIAGNOSIS — G893 Neoplasm related pain (acute) (chronic): Secondary | ICD-10-CM

## 2019-05-18 DIAGNOSIS — E876 Hypokalemia: Secondary | ICD-10-CM

## 2019-05-18 DIAGNOSIS — Z5112 Encounter for antineoplastic immunotherapy: Secondary | ICD-10-CM | POA: Diagnosis not present

## 2019-05-18 LAB — CBC WITH DIFFERENTIAL/PLATELET
Abs Immature Granulocytes: 0.01 10*3/uL (ref 0.00–0.07)
Basophils Absolute: 0 10*3/uL (ref 0.0–0.1)
Basophils Relative: 0 %
Eosinophils Absolute: 0.1 10*3/uL (ref 0.0–0.5)
Eosinophils Relative: 1 %
HCT: 40 % (ref 39.0–52.0)
Hemoglobin: 13.4 g/dL (ref 13.0–17.0)
Immature Granulocytes: 0 %
Lymphocytes Relative: 27 %
Lymphs Abs: 1.1 10*3/uL (ref 0.7–4.0)
MCH: 32 pg (ref 26.0–34.0)
MCHC: 33.5 g/dL (ref 30.0–36.0)
MCV: 95.5 fL (ref 80.0–100.0)
Monocytes Absolute: 0.3 10*3/uL (ref 0.1–1.0)
Monocytes Relative: 8 %
Neutro Abs: 2.6 10*3/uL (ref 1.7–7.7)
Neutrophils Relative %: 64 %
Platelets: 123 10*3/uL — ABNORMAL LOW (ref 150–400)
RBC: 4.19 MIL/uL — ABNORMAL LOW (ref 4.22–5.81)
RDW: 13.4 % (ref 11.5–15.5)
WBC: 4.1 10*3/uL (ref 4.0–10.5)
nRBC: 0 % (ref 0.0–0.2)

## 2019-05-18 LAB — COMPREHENSIVE METABOLIC PANEL
ALT: 15 U/L (ref 0–44)
AST: 18 U/L (ref 15–41)
Albumin: 4.2 g/dL (ref 3.5–5.0)
Alkaline Phosphatase: 57 U/L (ref 38–126)
Anion gap: 10 (ref 5–15)
BUN: 18 mg/dL (ref 8–23)
CO2: 27 mmol/L (ref 22–32)
Calcium: 9.5 mg/dL (ref 8.9–10.3)
Chloride: 98 mmol/L (ref 98–111)
Creatinine, Ser: 1.01 mg/dL (ref 0.61–1.24)
GFR calc Af Amer: >60 mL/min (ref >60)
GFR calc non Af Amer: >60 mL/min (ref >60)
Glucose, Bld: 113 mg/dL — ABNORMAL HIGH (ref 70–99)
Potassium: 3.4 mmol/L — ABNORMAL LOW (ref 3.5–5.1)
Sodium: 135 mmol/L (ref 135–145)
Total Bilirubin: 0.9 mg/dL (ref 0.3–1.2)
Total Protein: 7.4 g/dL (ref 6.5–8.1)

## 2019-05-18 LAB — MAGNESIUM: Magnesium: 1.9 mg/dL (ref 1.7–2.4)

## 2019-05-18 LAB — TSH: TSH: 2.443 u[IU]/mL (ref 0.350–4.500)

## 2019-05-18 MED ORDER — DEXAMETHASONE 4 MG PO TABS: ORAL_TABLET | ORAL | 1 refills | Status: DC

## 2019-05-18 MED ORDER — CYANOCOBALAMIN 1000 MCG/ML IJ SOLN
1000.0000 ug | Freq: Once | INTRAMUSCULAR | Status: AC
  Administered 2019-05-18: 1000 ug via INTRAMUSCULAR

## 2019-05-18 MED ORDER — FOLIC ACID 1 MG PO TABS: 1.0000 mg | ORAL_TABLET | Freq: Every day | ORAL | 3 refills | Status: DC

## 2019-05-22 NOTE — Progress Notes (Signed)
Pharmacist Chemotherapy Monitoring - Initial Assessment    Anticipated start date: 05/28/19   Regimen:  . Are orders appropriate based on the patient's diagnosis, regimen, and cycle? Yes . Does the plan date match the patient's scheduled date? Yes . Is the sequencing of drugs appropriate? Yes . Are the premedications appropriate for the patient's regimen? Yes . Prior Authorization for treatment is: Approved o If applicable, is the correct biosimilar selected based on the patient's insurance? not applicable  Organ Function and Labs: Marland Kitchen Are dose adjustments needed based on the patient's renal function, hepatic function, or hematologic function? Yes . Are appropriate labs ordered prior to the start of patient's treatment? Yes . Other organ system assessment, if indicated: N/A . The following baseline labs, if indicated, have been ordered: N/A  Dose Assessment: . Are the drug doses appropriate? Yes . Are the following correct: o Drug concentrations Yes o IV fluid compatible with drug Yes o Administration routes Yes o Timing of therapy Yes . If applicable, does the patient have documented access for treatment and/or plans for port-a-cath placement? yes . If applicable, have lifetime cumulative doses been properly documented and assessed? not applicable Lifetime Dose Tracking  No doses have been documented on this patient for the following tracked chemicals: Doxorubicin, Epirubicin, Idarubicin, Daunorubicin, Mitoxantrone, Bleomycin, Oxaliplatin, Carboplatin, Liposomal Doxorubicin  o   Toxicity Monitoring/Prevention: . The patient has the following take home antiemetics prescribed: Dexamethasone . The patient has the following take home medications prescribed: N/A . Medication allergies and previous infusion related reactions, if applicable, have been reviewed and addressed. Yes . The patient's current medication list has been assessed for drug-drug interactions with their chemotherapy  regimen. no significant drug-drug interactions were identified on review.  Order Review: . Are the treatment plan orders signed? No . Is the patient scheduled to see a provider prior to their treatment? Yes  I verify that I have reviewed each item in the above checklist and answered each question accordingly.  Deshayla Empson K 05/22/2019 2:22 PM

## 2019-05-24 ENCOUNTER — Inpatient Hospital Stay: Payer: Medicare HMO

## 2019-05-24 NOTE — Progress Notes (Addendum)
Tumor Board Documentation  TAHJI Andres Hebert was presented by Dr Mike Gip at our Tumor Board on 05/24/2019, which included representatives from medical oncology, radiation oncology, navigation, pathology, radiology, surgical oncology, internal medicine, genetics, pulmonology, palliative care, research.  Andres Hebert currently presents as a current patient, for discussion with history of the following treatments: immunotherapy, active survellience.  Additionally, we reviewed previous medical and familial history, history of present illness, and recent lab results along with all available histopathologic and imaging studies. The tumor board considered available treatment options and made the following recommendations:  Chemotherapy (single agent Alimta)   The following procedures/referrals were also placed: No orders of the defined types were placed in this encounter.   Clinical Trial Status: not discussed   Staging used: Pathologic Stage  AJCC Staging:       Group: Stage 4 Adenocarcinoma Of Right Lung   National site-specific guidelines   were discussed with respect to the case.  Tumor board is a meeting of clinicians from various specialty areas who evaluate and discuss patients for whom a multidisciplinary approach is being considered. Final determinations in the plan of care are those of the provider(s). The responsibility for follow up of recommendations given during tumor board is that of the provider.   Today's extended care, comprehensive team conference, [name] was not present for the discussion and was not examined.   Multidisciplinary Tumor Board is a multidisciplinary case peer review process.  Decisions discussed in the Multidisciplinary Tumor Board reflect the opinions of the specialists present at the conference without having examined the patient.  Ultimately, treatment and diagnostic decisions rest with the primary provider(s) and the patient.

## 2019-05-27 NOTE — Progress Notes (Signed)
[facility]  [address], [address] [place], [place] [address] Phone: [phone]  Fax: [phone]   Office Visit:  05/28/2019  Referring physician: [name], MD  Chief Complaint: Andres Hebert is a 86 y.o. male with stage IV adenocarcinoma of therightlung who is seen for assessment prior to cycle #1 Alimta.    HPI: The patient was last seen in the medical oncology clinic on 05/18/2019. At that time,  he was feeling a little better.  He denied any respiratory symptoms. Pain was well controlled.  Exam was stable. Hematocrit was 40.0, hemoglobin 13.4, MCV 95.5, platelets 123,000, WBC 4,100, ANC 2,600. TSH was 2.44.  Imaging studied revealed a mixed reponse.  We discussed a change in therapy from pembrolizumab to Alimta.  He received B12 and began oral folic acid.  An Rx for Decadron premedications was provided.   During the interim, he has been feeling pretty well. He recently went on vacation to the beach. He was happy because he got to eat lots of fresh seafood.  He denies any concerns.  He notes an intermittent cough.   Past Medical History:  Diagnosis Date  . BPV (benign positional vertigo)   . Cancer ([place])    lung cancer stage IV  . Chronic constipation   . Collapsed lung 1959   right  . Diabetes mellitus without complication (HCC)    borderline  . History of hiatal hernia   . Hyperlipidemia   . Hypertension     Past Surgical History:  Procedure Laterality Date  . CATARACT EXTRACTION, BILATERAL    . COLONOSCOPY    . ESOPHAGOGASTRODUODENOSCOPY    . EYE SURGERY    . KYPHOPLASTY N/A 02/20/2019   Procedure: L3 KYPHOPLASTY;  Surgeon: Hessie Knows, MD;  Location: [facility] OR[facility];  Service: Orthopedics;  Laterality: N/A;  . PORTA CATH INSERTION N/A 11/27/2018   Procedure: PORTA CATH INSERTION;  Surgeon: Algernon Huxley, MD;  Location: [facility];  Service: Cardiovascular;  Laterality: N/A;    Family History  Problem Relation Age of  Onset  . Stroke Mother   . Epilepsy Mother   . Prostate cancer Father     Social History:  reports that he quit smoking about 45 years ago. His smoking use included cigarettes. He has a 25.00 pack-year smoking history. He has never used smokeless tobacco. He reports previous alcohol use. He reports that he does not use drugs. He stoppedsmoking about 45 years ago.He denies any exposure to asbestos, radiation or toxins.He worked for [employer] and [place] recently lost his son.  He lives in [place].His wife's name isSheila. The patient is accompanied by his wife via teleconference today.   Allergies:  Allergies  Allergen Reactions  . Accupril [Quinapril Hcl] Other (See Comments)    dizziness per MR    Current Medications: Current Outpatient Medications  Medication Sig Dispense Refill  . amLODipine (NORVASC) 2.5 MG tablet Take 2.5 mg by mouth daily.    Marland Kitchen aspirin EC 81 MG tablet Take 81 mg by mouth daily.    . citalopram (CELEXA) 10 MG tablet Take 1 tablet (10 mg total) by mouth daily. May increase to two tablets (5m total) after one week if tolerating 45 tablet 0  . dexamethasone (DECADRON) 4 MG tablet Take 1 tab two times a day the day before Alimta chemo. Take 2 tabs the day after chemo, then take 2 tabs two times a day for 2 days. 30 tablet 1  . feeding supplement,  ENSURE ENLIVE, (ENSURE ENLIVE) LIQD Take 237 mLs by mouth 3 (three) times daily between meals. (Patient taking differently: Take 237 mLs by mouth 4 (four) times a week. ) 454 mL 0  . folic acid (FOLVITE) 1 MG tablet Take 1 tablet (1 mg total) by mouth daily. Start 5-7 days before Alimta chemotherapy. Continue until 21 days after Alimta completed. 100 tablet 3  . oxyCODONE (OXY IR/ROXICODONE) 5 MG immediate release tablet Take 1 tablet every 4 hours as needed for pain.  Keep a pain diary. 40 tablet 0  . OXYGEN Inhale into the lungs as needed.    . potassium chloride (K-DUR,KLOR-CON) 10 MEQ tablet Take 10 mEq by mouth 2 (two)  times daily.    . pravastatin (PRAVACHOL) 40 MG tablet Take 40 mg by mouth daily.    . traZODone (DESYREL) 50 MG tablet Take 100 mg by mouth at bedtime.     . triamterene-hydrochlorothiazide (MAXZIDE-25) 37.5-25 MG tablet Take 1 tablet by mouth daily.    Marland Kitchen lidocaine-prilocaine (EMLA) cream Apply 1 application topically as needed. (Patient not taking: Reported on 03/14/2019) 30 g 1  . polyethylene glycol (MIRALAX / GLYCOLAX) 17 g packet Take 17 g by mouth 2 (two) times daily. (Patient not taking: Reported on 04/27/2019) 14 each 0  . senna (SENOKOT) 8.6 MG TABS tablet Take 1 tablet (8.6 mg total) by mouth daily as needed for mild constipation. (Patient not taking: Reported on 04/27/2019) 30 tablet 0   No current facility-administered medications for this visit.   Facility-Administered Medications Ordered in Other Visits  Medication Dose Route Frequency Provider Last Rate Last Admin  . heparin lock flush 100 unit/mL  500 Units Intravenous Once Kwana Ringel C, MD      . sodium chloride flush (NS) 0.9 % injection 10 mL  10 mL Intravenous PRN Lequita Asal, MD   10 mL at 05/28/19 1317    Review of Systems  Constitutional: Positive for malaise/fatigue. Negative for chills, diaphoresis, fever and weight loss (up 3 lbs).       Energy level is a little better.  HENT: Negative for congestion, ear pain, nosebleeds, sinus pain, sore throat and tinnitus.   Eyes: Negative.  Negative for blurred vision, double vision and photophobia.  Respiratory: Positive for cough (3 times a day). Negative for hemoptysis, sputum production and shortness of breath.   Cardiovascular: Negative.  Negative for chest pain, palpitations and leg swelling.  Gastrointestinal: Negative for abdominal pain, blood in stool, constipation, diarrhea, heartburn, melena, nausea and vomiting.       Eating better.  Genitourinary: Negative.  Negative for dysuria, frequency and urgency.  Musculoskeletal: Positive for back pain. Negative  for falls, joint pain, myalgias and neck pain.  Skin: Negative for itching and rash.  Neurological: Positive for weakness (generalized). Negative for dizziness, tingling, tremors, sensory change, speech change, focal weakness and headaches.  Endo/Heme/Allergies: Negative.  Negative for polydipsia. Does not bruise/bleed easily.  Psychiatric/Behavioral: Positive for memory loss. Negative for depression. The patient is not nervous/anxious and does not have insomnia.    Performance status (ECOG): 2  Physical Exam  Constitutional: He is oriented to person, place, and time. He appears well-developed and well-nourished. No distress. Face mask in place.  Thin gentleman sitting comfortably in the exam room.  He requires assistance onto the exam table.  HENT:  Head: Normocephalic and atraumatic.  Mouth/Throat: Oropharynx is clear and moist and mucous membranes are normal. No oral lesions. No oropharyngeal exudate.  Cap.  Pearline Cables  hair and beard.  Eyes: Pupils are equal, round, and reactive to light. Conjunctivae and EOM are normal. Right eye exhibits no discharge. Left eye exhibits no discharge. No scleral icterus.  Glasses.  Blue eyes.  Neck: No JVD present.  Cardiovascular: Normal rate, regular rhythm and normal heart sounds. Exam reveals no gallop and no friction rub.  No murmur heard. Pulmonary/Chest: Effort normal and breath sounds normal. No respiratory distress. He has no wheezes. He has no rhonchi. He has no rales.  Abdominal: Soft. Normal appearance and bowel sounds are normal. He exhibits no distension and no mass. There is no hepatosplenomegaly. There is no abdominal tenderness. There is no rebound and no guarding.  Musculoskeletal:        General: Edema (trace ankle) present. No tenderness. Normal range of motion.     Cervical back: Normal range of motion and neck supple.  Lymphadenopathy:       Head (right side): No preauricular, no posterior auricular and no occipital adenopathy present.        Head (left side): No preauricular, no posterior auricular and no occipital adenopathy present.    He has no cervical adenopathy.    He has no axillary adenopathy.       Right: No inguinal and no supraclavicular adenopathy present.       Left: No inguinal and no supraclavicular adenopathy present.  Neurological: He is alert and oriented to person, place, and time.  Skin: Skin is warm, dry and intact. No bruising, no lesion and no rash noted. He is not diaphoretic. No erythema. No pallor.  Psychiatric: He has a normal mood and affect. His behavior is normal. Judgment and thought content normal.  Nursing note and vitals reviewed.   Infusion on 05/28/2019  Component Date Value Ref Range Status  . Sodium 05/28/2019 135  135 - 145 mmol/L Final  . Potassium 05/28/2019 3.5  3.5 - 5.1 mmol/L Final  . Chloride 05/28/2019 95* 98 - 111 mmol/L Final  . CO2 05/28/2019 31  22 - 32 mmol/L Final  . Glucose, Bld 05/28/2019 145* 70 - 99 mg/dL Final   Glucose reference range applies only to samples taken after fasting for at least 8 hours.  . BUN 05/28/2019 34* 8 - 23 mg/dL Final  . Creatinine, Ser 05/28/2019 1.20  0.61 - 1.24 mg/dL Final  . Calcium 05/28/2019 9.4  8.9 - 10.3 mg/dL Final  . Total Protein 05/28/2019 7.2  6.5 - 8.1 g/dL Final  . Albumin 05/28/2019 3.9  3.5 - 5.0 g/dL Final  . AST 05/28/2019 18  15 - 41 U/L Final  . ALT 05/28/2019 14  0 - 44 U/L Final  . Alkaline Phosphatase 05/28/2019 54  38 - 126 U/L Final  . Total Bilirubin 05/28/2019 0.5  0.3 - 1.2 mg/dL Final  . GFR calc non Af Amer 05/28/2019 54* >60 mL/min Final  . GFR calc Af Amer 05/28/2019 >60  >60 mL/min Final  . Anion gap 05/28/2019 9  5 - 15 Final   Performed at [facility], [address] [place], [place] [address]  . WBC 05/28/2019 9.9  4.0 - 10.5 K/uL Final  . RBC 05/28/2019 4.04* 4.22 - 5.81 MIL/uL Final  . Hemoglobin 05/28/2019 13.0  13.0 - 17.0 g/dL Final  . HCT 05/28/2019 38.4* 39.0 - 52.0 % Final   . MCV 05/28/2019 95.0  80.0 - 100.0 fL Final  . MCH 05/28/2019 32.2  26.0 - 34.0 pg Final  . MCHC 05/28/2019  33.9  30.0 - 36.0 g/dL Final  . RDW 05/28/2019 13.3  11.5 - 15.5 % Final  . Platelets 05/28/2019 146* 150 - 400 K/uL Final  . nRBC 05/28/2019 0.0  0.0 - 0.2 % Final  . Neutrophils Relative % 05/28/2019 78  % Final  . Neutro Abs 05/28/2019 7.8* 1.7 - 7.7 K/uL Final  . Lymphocytes Relative 05/28/2019 12  % Final  . Lymphs Abs 05/28/2019 1.2  0.7 - 4.0 K/uL Final  . Monocytes Relative 05/28/2019 9  % Final  . Monocytes Absolute 05/28/2019 0.9  0.1 - 1.0 K/uL Final  . Eosinophils Relative 05/28/2019 0  % Final  . Eosinophils Absolute 05/28/2019 0.0  0.0 - 0.5 K/uL Final  . Basophils Relative 05/28/2019 0  % Final  . Basophils Absolute 05/28/2019 0.0  0.0 - 0.1 K/uL Final  . Immature Granulocytes 05/28/2019 1  % Final  . Abs Immature Granulocytes 05/28/2019 0.05  0.00 - 0.07 K/uL Final   Performed at [facility], [address], [place], [place] [address]  . Magnesium 05/28/2019 2.1  1.7 - 2.4 mg/dL Final   Performed at [facility], [address], [place], [place] [address]    Assessment:  Andres Hebert is a 86 y.o. male with metastaticadenocarcinoma of thelungs/p right sided thoracentesis on 10/06/2018.Cytologyconfirmed non-small cell carcinoma, favor adenocarcinoma of the lung.He has a 25-30 pack year smoking history.He has clinical stageT4NxM1b.  PD-L1 testing was 80% on 11/09/2018. Foundation Onetesting revealednumber mutational burden (TMB)18 Muts/Mb, MSI-stable, ATM splice site 7253-6U>Y, CDKN2A loss, CDKN2B loss, DNMT3A Y533C, FGF10 amplification, KRAS G12C, MTAP loss, and RBM10 A452f*216.  Chest CT angiogramon 10/05/2018 revealed a 8.4 x 6.9 cm right lower lobe mass with numerous pleural nodules and a large right pleural effusion. There was bony destruction of the right lateral 7th and 8th ribs. There was no pulmonary  embolism.  Head MRIon 10/07/2018 revealed no metastatic disease or acute intracranial abnormality.There was distal left vertebral artery with poor flow or occlusion, most likely due to atherosclerosis.  PET scanon 10/18/2018 revealed a large hypermetabolic RIGHT lower lobe mass consistent with bronchogenic carcinoma. There was multifocal pleural metastasis within the RIGHT hemithorax. There was one pleural metastatic lesion invading adjacent RIGHT seventh rib, and moderate RIGHT effusion. There was probable RIGHT hilar metastatic adenopathy, and no mediastinal or supraclavicular metastatic adenopathy.   He is s/pthoracentesisx 2 (10/06/2018 and10/05/2018).CT biopsyof the right lower lobelung masson 10/31/2018 was performed for mutational studies.  He is s/p cycle #1 Alimta and pembrolizumabon 11/03/2018.He iss/p8cycles ofpembrolizumabalone (11/24/2018-04/27/2019).  Chest CTon 02/12/2019 revealed interval improvement in the patient's malignancy. The primary mass was3.9 x 3.6 cm (previously 5.7 x 7.5 cm). The right pleural nodularityhadimproved insize andnumberof lesions(nodularity abutting right 5th rib was1.5 x 0.5 cm; previously 2.9 x 1.4 cm). There were sclerotic changes in multiple right-sided ribsc/wtreated metastatic disease. There was no right hilar adenopathy. The right pleural effusion was smaller. There was severe emphysematous changes in the lungs. There was no metastatic disease below the diaphragm.   Bone scanon 02/14/2019 revealed a linear bandlike uptake along the inferior aspect of the L3 vertebral body. This would not be typical for metastatic disease and may be related to compression fracture. Lumbar spine MRI could be used to further evaluate. There was radiotracer accumulation in the lateral right fifth and seventh ribsc/wknown metastatic disease as characterized on previous chest CT and PET-CT. There was no unexpected uptake in the bony  pelvis.  Lumbar spine MRIon 02/15/2019 revealed  amild inferior endplate compression fracture of L3with vertebral body height loss of up to approximately 30% has an appearance most c/wa senile osteoporotic injury.There was no evidencefor metastatic disease. There was partial visualization of a right pleural effusion. The patient had a right effusion on the prior chest CT.There was an approximately 3.6 cm abdominal aortic aneurysm(chronic).He underwent L3 kyphoplastyon 02/20/2019.  Chest CT on 05/15/2019 revealed slight interval decrease in size and fullness of a spiculated infrahilar mass of the right lower lobe, with extensive postobstructive consolidation of the right lung base and a small right pleural effusion. There was interval increase in size in multiple subpleural soft tissue nodules about the right lung c/w worsened pleural metastatic disease.  There were unchanged sclerotic rib lesions.   He received the influenza vaccineon 10/13/2018.  He received his second COVID-19 vaccine on 04/19/2019.  Symptomatically, he feels better.  He has gained weight.  He has an intermittent cough.  Exam is unremarkable.  Plan: 1.   Labs today:  CBC with diff, CMP, Mg, TSH 2.   Stage IV adenocarcinoma of the lung Clinically, he appears to be doing well.  Patient iss/p 1 cycle of Alimta and pembrolizumab. Patient is s/p8cycles ofpembrolizumab alone(last04/02/2019). Chest CT on 04/27/2019 revealed a mixed response with slight decrease in the right infrahilar mass and increase in subpleural nodules in the right lung.  Pembrolizumab was discontinued (last dose on 04/27/2019).  Review plan for Alimta every 3 weeks.   He is on folic acid 1 mg/day.   He received B12 on 05/18/2019 (continue every 9 weeks).   Patient took Decadron yesterday.    Rx provided today.  Review labs.  Creatinine has increased.  Hold treatment today. 3. Renal  insufficiency  Creatinine 1.20 (CrCl 39.4 ml/min) today.  Baseline creatinine 0.82 - 1.01.  Check renal ultrasound to r/o obstruction.  Encourage fluids. 4.   Bone metastasis Patienthasright lower ribmetastasis seen on chest CT. Bone scan on 02/14/2019 revealed only lateral right 5th and 7th ribs c/w metastatic disease. May consider Xgeva in future if creatinine stable. 5.Cancer-related pain Pain remains well controlled. 6. Hypokalemia Potassium3.5 today. Continue to monitor. 7.   Weight loss  Weight has improved after vacation.  Continue to monitor. 8.   No Alimta today.  Schedule renal ultrasound. 9.   RTC in 1 week for MD assessment, labs (CBC with diff, CMP, Mg), and cycle #1 Alimta.  I discussed the assessment and treatment plan with the patient.  The patient was provided an opportunity to ask questions and all were answered.  The patient agreed with the plan and demonstrated an understanding of the instructions.  The patient was advised to call back if the symptoms worsen or if the condition fails to improve as anticipated.  I provided 17 minutes (1:32 PM - 1:49 PM) of face-to-face time during this this encounter and > 50% was spent counseling as documented under my assessment and plan. An additional 5 minutes were spent reviewing his chart (Epic and Care Everywhere) including notes, labs, and imaging studies.     Cameran Ahmed C. Mike Gip, MD, PhD    05/28/2019, 9:51 AM  I, Jacqualyn Posey, am acting as a Education administrator for [employer]. Mike Gip, MD.   I, Darianna Amy C. Mike Gip, MD, have reviewed the above documentation for accuracy and completeness, and I agree with the above.

## 2019-05-28 ENCOUNTER — Inpatient Hospital Stay: Payer: Medicare HMO

## 2019-05-28 ENCOUNTER — Telehealth: Payer: Self-pay

## 2019-05-28 ENCOUNTER — Encounter: Payer: Self-pay | Admitting: Hematology and Oncology

## 2019-05-28 ENCOUNTER — Ambulatory Visit
Admission: RE | Admit: 2019-05-28 | Discharge: 2019-05-28 | Disposition: A | Discharge status: Home / Self Care | Payer: Medicare HMO | Source: Ambulatory Visit | Attending: Hematology and Oncology | Admitting: Hematology and Oncology

## 2019-05-28 ENCOUNTER — Inpatient Hospital Stay: Payer: Medicare HMO | Attending: Hematology and Oncology | Admitting: Hematology and Oncology

## 2019-05-28 ENCOUNTER — Other Ambulatory Visit: Payer: Self-pay

## 2019-05-28 VITALS — BP 112/58 | HR 71 | Temp 97.8°F | Resp 16 | Wt 138.8 lb

## 2019-05-28 DIAGNOSIS — J90 Pleural effusion, not elsewhere classified: Secondary | ICD-10-CM | POA: Insufficient documentation

## 2019-05-28 DIAGNOSIS — Z7189 Other specified counseling: Secondary | ICD-10-CM

## 2019-05-28 DIAGNOSIS — C3431 Malignant neoplasm of lower lobe, right bronchus or lung: Secondary | ICD-10-CM | POA: Insufficient documentation

## 2019-05-28 DIAGNOSIS — C7951 Secondary malignant neoplasm of bone: Secondary | ICD-10-CM | POA: Insufficient documentation

## 2019-05-28 DIAGNOSIS — Z823 Family history of stroke: Secondary | ICD-10-CM | POA: Diagnosis not present

## 2019-05-28 DIAGNOSIS — M549 Dorsalgia, unspecified: Secondary | ICD-10-CM | POA: Insufficient documentation

## 2019-05-28 DIAGNOSIS — I714 Abdominal aortic aneurysm, without rupture: Secondary | ICD-10-CM | POA: Diagnosis not present

## 2019-05-28 DIAGNOSIS — Z8042 Family history of malignant neoplasm of prostate: Secondary | ICD-10-CM | POA: Diagnosis not present

## 2019-05-28 DIAGNOSIS — Z7952 Long term (current) use of systemic steroids: Secondary | ICD-10-CM | POA: Insufficient documentation

## 2019-05-28 DIAGNOSIS — R5383 Other fatigue: Secondary | ICD-10-CM | POA: Insufficient documentation

## 2019-05-28 DIAGNOSIS — E119 Type 2 diabetes mellitus without complications: Secondary | ICD-10-CM | POA: Diagnosis not present

## 2019-05-28 DIAGNOSIS — I10 Essential (primary) hypertension: Secondary | ICD-10-CM | POA: Diagnosis not present

## 2019-05-28 DIAGNOSIS — Z820 Family history of epilepsy and other diseases of the nervous system: Secondary | ICD-10-CM | POA: Insufficient documentation

## 2019-05-28 DIAGNOSIS — E785 Hyperlipidemia, unspecified: Secondary | ICD-10-CM | POA: Insufficient documentation

## 2019-05-28 DIAGNOSIS — N289 Disorder of kidney and ureter, unspecified: Secondary | ICD-10-CM | POA: Insufficient documentation

## 2019-05-28 DIAGNOSIS — R05 Cough: Secondary | ICD-10-CM | POA: Diagnosis not present

## 2019-05-28 DIAGNOSIS — G893 Neoplasm related pain (acute) (chronic): Secondary | ICD-10-CM | POA: Diagnosis not present

## 2019-05-28 DIAGNOSIS — Z79899 Other long term (current) drug therapy: Secondary | ICD-10-CM | POA: Diagnosis not present

## 2019-05-28 DIAGNOSIS — N281 Cyst of kidney, acquired: Secondary | ICD-10-CM | POA: Diagnosis not present

## 2019-05-28 DIAGNOSIS — C782 Secondary malignant neoplasm of pleura: Secondary | ICD-10-CM | POA: Insufficient documentation

## 2019-05-28 DIAGNOSIS — E876 Hypokalemia: Secondary | ICD-10-CM | POA: Diagnosis not present

## 2019-05-28 DIAGNOSIS — R635 Abnormal weight gain: Secondary | ICD-10-CM | POA: Insufficient documentation

## 2019-05-28 DIAGNOSIS — Z5111 Encounter for antineoplastic chemotherapy: Secondary | ICD-10-CM

## 2019-05-28 LAB — COMPREHENSIVE METABOLIC PANEL
ALT: 14 U/L (ref 0–44)
AST: 18 U/L (ref 15–41)
Albumin: 3.9 g/dL (ref 3.5–5.0)
Alkaline Phosphatase: 54 U/L (ref 38–126)
Anion gap: 9 (ref 5–15)
BUN: 34 mg/dL — ABNORMAL HIGH (ref 8–23)
CO2: 31 mmol/L (ref 22–32)
Calcium: 9.4 mg/dL (ref 8.9–10.3)
Chloride: 95 mmol/L — ABNORMAL LOW (ref 98–111)
Creatinine, Ser: 1.2 mg/dL (ref 0.61–1.24)
GFR calc Af Amer: >60 mL/min (ref >60)
GFR calc non Af Amer: 54 mL/min — ABNORMAL LOW (ref >60)
Glucose, Bld: 145 mg/dL — ABNORMAL HIGH (ref 70–99)
Potassium: 3.5 mmol/L (ref 3.5–5.1)
Sodium: 135 mmol/L (ref 135–145)
Total Bilirubin: 0.5 mg/dL (ref 0.3–1.2)
Total Protein: 7.2 g/dL (ref 6.5–8.1)

## 2019-05-28 LAB — CBC WITH DIFFERENTIAL/PLATELET
Abs Immature Granulocytes: 0.05 10*3/uL (ref 0.00–0.07)
Basophils Absolute: 0 10*3/uL (ref 0.0–0.1)
Basophils Relative: 0 %
Eosinophils Absolute: 0 10*3/uL (ref 0.0–0.5)
Eosinophils Relative: 0 %
HCT: 38.4 % — ABNORMAL LOW (ref 39.0–52.0)
Hemoglobin: 13 g/dL (ref 13.0–17.0)
Immature Granulocytes: 1 %
Lymphocytes Relative: 12 %
Lymphs Abs: 1.2 10*3/uL (ref 0.7–4.0)
MCH: 32.2 pg (ref 26.0–34.0)
MCHC: 33.9 g/dL (ref 30.0–36.0)
MCV: 95 fL (ref 80.0–100.0)
Monocytes Absolute: 0.9 10*3/uL (ref 0.1–1.0)
Monocytes Relative: 9 %
Neutro Abs: 7.8 10*3/uL — ABNORMAL HIGH (ref 1.7–7.7)
Neutrophils Relative %: 78 %
Platelets: 146 10*3/uL — ABNORMAL LOW (ref 150–400)
RBC: 4.04 MIL/uL — ABNORMAL LOW (ref 4.22–5.81)
RDW: 13.3 % (ref 11.5–15.5)
WBC: 9.9 10*3/uL (ref 4.0–10.5)
nRBC: 0 % (ref 0.0–0.2)

## 2019-05-28 LAB — MAGNESIUM: Magnesium: 2.1 mg/dL (ref 1.7–2.4)

## 2019-05-28 MED ORDER — SODIUM CHLORIDE 0.9 % IV SOLN
Freq: Once | INTRAVENOUS | Status: AC
  Filled 2019-05-28: qty 250

## 2019-05-28 MED ORDER — SODIUM CHLORIDE 0.9% FLUSH
10.0000 mL | INTRAVENOUS | Status: DC | PRN
  Administered 2019-05-28: 10 mL via INTRAVENOUS
  Filled 2019-05-28: qty 10

## 2019-05-28 MED ORDER — ONDANSETRON HCL 4 MG PO TABS
8.0000 mg | ORAL_TABLET | Freq: Once | ORAL | Status: AC
  Administered 2019-05-28: 8 mg via ORAL

## 2019-05-28 MED ORDER — CYANOCOBALAMIN 1000 MCG/ML IJ SOLN: 1000.0000 ug | Freq: Once | INTRAMUSCULAR | Status: DC

## 2019-05-28 MED ORDER — SODIUM CHLORIDE 0.9 % IV SOLN: 900.0000 mg | Freq: Once | INTRAVENOUS | Status: DC

## 2019-05-28 MED ORDER — HEPARIN SOD (PORK) LOCK FLUSH 100 UNIT/ML IV SOLN
500.0000 [IU] | Freq: Once | INTRAVENOUS | Status: AC
  Administered 2019-05-28: 15:00:00 500 [IU] via INTRAVENOUS
  Filled 2019-05-28: qty 5

## 2019-05-28 NOTE — Telephone Encounter (Signed)
Spoke with Ms Spelman to inform that Mr [place] was normal, If you can please encourage him to drink plenty of fluids and if his Creatinin dose not improve she will do a referral to the nephology.Ms Pasha was understanding and agreeable.

## 2019-05-28 NOTE — Progress Notes (Signed)
No new changes noted today 

## 2019-05-28 NOTE — Progress Notes (Signed)
Patient's creatinine clearance is elevated at this time.  Will hold treatment and give fluids.  MD to put orders in for renal ultrasound.  Plan explained to patient's wife.

## 2019-05-28 NOTE — Telephone Encounter (Signed)
-----   Message from Lequita Asal, MD sent at 05/28/2019  3:57 PM EDT ----- Regarding: Please call patient  Renal ultrasound was normal.  He has a few cysts.  Let's encourage fluids and try again next week.  If creatinine does not improve, we will refer him to nephrology.  M ----- Message ----- From: Interface, Rad Results In Sent: 05/28/2019   3:50 PM EDT To: Lequita Asal, MD

## 2019-05-29 LAB — CEA: CEA: 4 ng/mL (ref 0.0–4.7)

## 2019-05-29 NOTE — Progress Notes (Signed)
Pharmacist Chemotherapy Monitoring - Follow Up Assessment    I verify that I have reviewed each item in the below checklist:  . Regimen for the patient is scheduled for the appropriate day and plan matches scheduled date. Marland Kitchen Appropriate non-routine labs are ordered dependent on drug ordered. . If applicable, additional medications reviewed and ordered per protocol based on lifetime cumulative doses and/or treatment regimen.   Plan for follow-up and/or issues identified: No . I-vent associated with next due treatment: No . MD and/or nursing notified: No  Andres Hebert K 05/29/2019 1:35 PM

## 2019-05-30 ENCOUNTER — Telehealth: Payer: Self-pay | Admitting: *Deleted

## 2019-05-30 ENCOUNTER — Telehealth: Payer: Self-pay | Admitting: Hematology and Oncology

## 2019-05-30 NOTE — Telephone Encounter (Signed)
Re:  Sleeping  Patient's wife called regarding patient sleeping since last being seen in clinic on 05/28/2019.  At that time he did not receive Alimta given the decline in creatinine clearance.  Renal ultrasound revealed no evidence of obstruction and only renal cysts.  He received ondansetron by mouth only in clinic as well as IV fluids.  Patient's wife states that he has been sleeping since coming home on 05/28/2019.  She does wake him up.  He is eating and drinking but spending the majority of the day sleeping.  I stated that we would see him in clinic for evaluation.  I inquired if he had any access to pain medications.  He does.  She states that she gives him all of his medications and does not think he has gotten into anything he should not have.  I requested the patient be seen in clinic.  As we were talking on the phone, the patient came out of his room and was walking about and talking with her normally.  She stated that she would observe him at home at this time.  If she has any further concerns, she will contact the clinic for evaluation.  Several questions were asked and answered.   Lequita Asal, MD

## 2019-05-30 NOTE — Telephone Encounter (Signed)
Patient family member called reporting that patient has basically been sleeping all the time since Monday when he came in for his Alimta treatment. She si asking if we gave him anything to make him this way. Please advise

## 2019-05-31 ENCOUNTER — Emergency Department: Payer: Medicare HMO

## 2019-05-31 ENCOUNTER — Encounter: Payer: Self-pay | Admitting: *Deleted

## 2019-05-31 ENCOUNTER — Other Ambulatory Visit: Payer: Self-pay

## 2019-05-31 DIAGNOSIS — E119 Type 2 diabetes mellitus without complications: Secondary | ICD-10-CM | POA: Insufficient documentation

## 2019-05-31 DIAGNOSIS — J449 Chronic obstructive pulmonary disease, unspecified: Secondary | ICD-10-CM | POA: Diagnosis not present

## 2019-05-31 DIAGNOSIS — Z79899 Other long term (current) drug therapy: Secondary | ICD-10-CM | POA: Diagnosis not present

## 2019-05-31 DIAGNOSIS — K5641 Fecal impaction: Secondary | ICD-10-CM | POA: Insufficient documentation

## 2019-05-31 DIAGNOSIS — K5900 Constipation, unspecified: Secondary | ICD-10-CM | POA: Diagnosis present

## 2019-05-31 DIAGNOSIS — Z87891 Personal history of nicotine dependence: Secondary | ICD-10-CM | POA: Diagnosis not present

## 2019-05-31 DIAGNOSIS — I10 Essential (primary) hypertension: Secondary | ICD-10-CM | POA: Insufficient documentation

## 2019-05-31 NOTE — ED Triage Notes (Signed)
p to triage via wheelchair.  Pt has constipation for 1 week.  No vomiting.  Pt reports  Butt pain .  Denies abd pain.  Pt alert  Speech clear.

## 2019-05-31 NOTE — Progress Notes (Signed)
[facility]  [address] [place], [place] [address] Phone: [phone]  Fax: [phone]   Clinic Day:  06/04/2019  Referring physician: [name], MD  Chief Complaint: Andres Hebert is a 86 y.o. male with stage IV adenocarcinoma of therightlung who is seen for a 1 week assessment and consideration of cycle #1 Alimta.  HPI: The patient was last seen in the medical oncology clinic on 05/28/2019. At that time, he felt better.  He had gained weight after his trip to the beach.  He had an intermittent cough.  Exam was unremarkable.  Hematocrit was 38.4, hemoglobin 13.0, platelets 146,000, WBC 9,900 (ANC 7,800). Creatinine was 1.20 (CrCl 39.4 ml/min). CEA was 4.0. The patient treatment was held secondary to his elevated creatinine. He received IV fluids and oral ondansetron. Renal ultrasound was ordered. We discussed a referral to nephrology if his creatinine did not improve. I encouraged the patient to drink plenty of fluids.   Renal ultrasound on 05/28/2019 showed bilateral simple renal cysts. No other renal abnormality was noted.  I spoke with the patient's wife on 05/30/2019. I discussed the results of his renal ultrasound. Patient's wife stated that he had been sleeping since coming home on 05/28/2019.  He was eating and drinking but spending the majority of the day sleeping.  I stated that we would see him in clinic for evaluation.  I inquired if he had any access to pain medications.  He did.  She stated that she gave him all of his medications; she did not think he had gotten into anything he should not have.  I requested the patient be seen in clinic.  As we were talking on the phone, the patient came out of his room and was walking about and talking with her normally.  She stated that she would observe him at home at this time.  If she had any further concerns, she would contact the clinic for evaluation.  Several questions were asked and  answered.  He was seen in the ER on 06/01/2019 for constipation x 1 week. His wife stated that she had not been keeping good track of his bowel movements but he had been using his usual bowel regimen which included at least 3 Colace, 2 Ex-Lax, and a dose of MiraLAX that day.  In spite of this, he had not had a normal bowel movement in about a week ; he felt distended with abdominal pain and rectal pain when he tried to have a bowel movement.  He felt hard stool when he felt up in side with a finger but he could not move it out.  He had some passage of liquid stool but only a small amount.  He was given enema and underwent fecal disimpaction.   During the interim, he has felt "sleepy". He feels like getting out of bed is a chore. She notes that he is spitting up a lot of bubbles. His scalp itches a lot. She reports that he has not been sleeping well. She feels like her has been "talking out of his head" x 2-3 days. She reports that last night he had her looking for someone outside and looking for things around the home. He notes someone was outside. His wife notes that he has never been evaluated by neurology.   He notes some back pain. He has leg pain secondary from getting up and down at the hospital. His wife notes that he is taking his folic acid. The  patient took his steroids yesterday. The patient agreed to treatment today. His wife note he has poor fluid intake. He notes frequency 4-5 times per day.   Patient was unable to remember 2 out of the 3 words given (blue, seven, different). He notes he likes to clear his mind. He reports having dreams for the last 3 weeks that should not be in his head. Patient was able to remember the past event very well.    Past Medical History:  Diagnosis Date  . BPV (benign positional vertigo)   . Cancer ([place])    lung cancer stage IV  . Chronic constipation   . Collapsed lung 1959   right  . Diabetes mellitus without complication (HCC)    borderline  .  History of hiatal hernia   . Hyperlipidemia   . Hypertension     Past Surgical History:  Procedure Laterality Date  . CATARACT EXTRACTION, BILATERAL    . COLONOSCOPY    . ESOPHAGOGASTRODUODENOSCOPY    . EYE SURGERY    . KYPHOPLASTY N/A 02/20/2019   Procedure: L3 KYPHOPLASTY;  Surgeon: Hessie Knows, MD;  Location: [facility] ORS;  Service: Orthopedics;  Laterality: N/A;  . PORTA CATH INSERTION N/A 11/27/2018   Procedure: PORTA CATH INSERTION;  Surgeon: Algernon Huxley, MD;  Location: [facility];  Service: Cardiovascular;  Laterality: N/A;    Family History  Problem Relation Age of Onset  . Stroke Mother   . Epilepsy Mother   . Prostate cancer Father     Social History:  reports that he quit smoking about 45 years ago. His smoking use included cigarettes. He has a 25.00 pack-year smoking history. He has never used smokeless tobacco. He reports previous alcohol use. He reports that he does not use drugs. He stoppedsmoking about 45 years ago.He denies any exposure to asbestos, radiation or toxins.He worked for [employer] and [place] recently lost his son.  He lives in [place].His wife's name isSheila. The patient is accompanied by his wife via Page today.  Allergies:  Allergies  Allergen Reactions  . Accupril [Quinapril Hcl] Other (See Comments)    dizziness per MR    Current Medications: Current Outpatient Medications  Medication Sig Dispense Refill  . amLODipine (NORVASC) 2.5 MG tablet Take 2.5 mg by mouth daily.    Marland Kitchen aspirin EC 81 MG tablet Take 81 mg by mouth daily.    . citalopram (CELEXA) 10 MG tablet Take 1 tablet (10 mg total) by mouth daily. May increase to two tablets (85m total) after one week if tolerating 45 tablet 0  . dexamethasone (DECADRON) 4 MG tablet Take 1 tab two times a day the day before Alimta chemo. Take 2 tabs the day after chemo, then take 2 tabs two times a day for 2 days. 30 tablet 1  . feeding supplement, ENSURE ENLIVE, (ENSURE ENLIVE) LIQD Take 237  mLs by mouth 3 (three) times daily between meals. (Patient taking differently: Take 237 mLs by mouth 4 (four) times a week. ) 2767mL 0  . folic acid (FOLVITE) 1 MG tablet Take 1 tablet (1 mg total) by mouth daily. Start 5-7 days before Alimta chemotherapy. Continue until 21 days after Alimta completed. 100 tablet 3  . oxyCODONE (OXY IR/ROXICODONE) 5 MG immediate release tablet Take 1 tablet every 4 hours as needed for pain.  Keep a pain diary. 40 tablet 0  . OXYGEN Inhale into the lungs as needed.    . polyethylene glycol (MIRALAX / GLYCOLAX) 17  g packet Take 17 g by mouth 2 (two) times daily. 14 each 0  . potassium chloride (K-DUR,KLOR-CON) 10 MEQ tablet Take 10 mEq by mouth 2 (two) times daily.    . pravastatin (PRAVACHOL) 40 MG tablet Take 40 mg by mouth daily.    Marland Kitchen senna (SENOKOT) 8.6 MG TABS tablet Take 1 tablet (8.6 mg total) by mouth daily as needed for mild constipation. 30 tablet 0  . traZODone (DESYREL) 50 MG tablet Take 100 mg by mouth at bedtime.     . triamterene-hydrochlorothiazide (MAXZIDE-25) 37.5-25 MG tablet Take 1 tablet by mouth daily.    Marland Kitchen lidocaine-prilocaine (EMLA) cream Apply 1 application topically as needed. (Patient not taking: Reported on 03/14/2019) 30 g 1   No current facility-administered medications for this visit.   Facility-Administered Medications Ordered in Other Visits  Medication Dose Route Frequency Provider Last Rate Last Admin  . heparin lock flush 100 unit/mL  500 Units Intravenous Once Corcoran, Melissa C, MD      . sodium chloride flush (NS) 0.9 % injection 10 mL  10 mL Intravenous PRN Nolon Stalls C, MD   10 mL at 06/04/19 1320    Review of Systems  Constitutional: Positive for malaise/fatigue. Negative for chills, diaphoresis, fever and weight loss (stable).       Feels "sleepy". Getting out of bed is a chore.  HENT: Negative for congestion, ear discharge, ear pain, hearing loss, nosebleeds, sinus pain, sore throat and tinnitus.   Eyes:  Negative for blurred vision.  Respiratory: Positive for cough (spitting up bubbles). Negative for hemoptysis, sputum production and shortness of breath.   Cardiovascular: Negative for chest pain, palpitations and leg swelling.  Gastrointestinal: Negative for abdominal pain, blood in stool, constipation, diarrhea, heartburn, melena, nausea and vomiting.       Poor fluid intake.  Genitourinary: Positive for frequency (x 4-5 per day). Negative for dysuria, hematuria and urgency.  Musculoskeletal: Positive for back pain and myalgias (legs). Negative for joint pain and neck pain.  Skin: Positive for itching (scalp). Negative for rash.  Neurological: Negative for dizziness, tingling, sensory change, weakness (generalized) and headaches.  Endo/Heme/Allergies: Does not bruise/bleed easily.  Psychiatric/Behavioral: Positive for memory loss. Negative for depression. The patient has insomnia. The patient is not nervous/anxious.   All other systems reviewed and are negative.  Performance status (ECOG): 2  Vitals Blood pressure 118/60, pulse 64, temperature 97.9 F (36.6 C), temperature source Tympanic, resp. rate 18, height 5' 6"  (1.676 m), weight 137 lb (62.1 kg), SpO2 98 %.   Physical Exam  Constitutional: He is oriented to person, place, and time. He appears well-developed and well-nourished. No distress.  Thin gentleman sitting comfortably in a wheelchair.  HENT:  Head: Normocephalic and atraumatic.  Mouth/Throat: Oropharynx is clear and moist. No oropharyngeal exudate.  Cap.  Gray hair and beard. Mask.  Eyes: Pupils are equal, round, and reactive to light. Conjunctivae and EOM are normal. No scleral icterus.  Glasses. Blue eyes.  Cardiovascular: Normal rate, regular rhythm and normal heart sounds.  No murmur heard. Pulmonary/Chest: Effort normal and breath sounds normal. No respiratory distress. He has no wheezes. He has no rales. He exhibits no tenderness.  Abdominal: Soft. Bowel sounds are  normal. He exhibits no distension and no mass. There is no abdominal tenderness. There is no rebound and no guarding.  Musculoskeletal:        General: No tenderness or edema. Normal range of motion.     Cervical back: Normal range of  motion and neck supple.  Lymphadenopathy:    He has no cervical adenopathy.    He has no axillary adenopathy.  Neurological: He is alert and oriented to person, place, and time.  Skin: Skin is warm and dry. He is not diaphoretic.  Psychiatric: He has a normal mood and affect. His behavior is normal. Judgment and thought content normal.    Infusion on 06/04/2019  Component Date Value Ref Range Status  . WBC 06/04/2019 7.1  4.0 - 10.5 K/uL Final  . RBC 06/04/2019 4.05* 4.22 - 5.81 MIL/uL Final  . Hemoglobin 06/04/2019 13.2  13.0 - 17.0 g/dL Final  . HCT 06/04/2019 38.5* 39.0 - 52.0 % Final  . MCV 06/04/2019 95.1  80.0 - 100.0 fL Final  . MCH 06/04/2019 32.6  26.0 - 34.0 pg Final  . MCHC 06/04/2019 34.3  30.0 - 36.0 g/dL Final  . RDW 06/04/2019 13.1  11.5 - 15.5 % Final  . Platelets 06/04/2019 147* 150 - 400 K/uL Final  . nRBC 06/04/2019 0.0  0.0 - 0.2 % Final  . Neutrophils Relative % 06/04/2019 74  % Final  . Neutro Abs 06/04/2019 5.2  1.7 - 7.7 K/uL Final  . Lymphocytes Relative 06/04/2019 16  % Final  . Lymphs Abs 06/04/2019 1.2  0.7 - 4.0 K/uL Final  . Monocytes Relative 06/04/2019 9  % Final  . Monocytes Absolute 06/04/2019 0.6  0.1 - 1.0 K/uL Final  . Eosinophils Relative 06/04/2019 0  % Final  . Eosinophils Absolute 06/04/2019 0.0  0.0 - 0.5 K/uL Final  . Basophils Relative 06/04/2019 0  % Final  . Basophils Absolute 06/04/2019 0.0  0.0 - 0.1 K/uL Final  . Immature Granulocytes 06/04/2019 1  % Final  . Abs Immature Granulocytes 06/04/2019 0.07  0.00 - 0.07 K/uL Final   Performed at [facility], [address], [place], [place] [address]    Assessment:  Andres Hebert is a 86 y.o. male with metastaticadenocarcinoma of  thelungs/p right sided thoracentesis on 10/06/2018.Cytologyconfirmed non-small cell carcinoma, favor adenocarcinoma of the lung.He has a 25-30 pack year smoking history.He has clinical stageT4NxM1b.  PD-L1 testing was 80% on 11/09/2018. Foundation Onetesting revealednumber mutational burden (TMB)18 Muts/Mb, MSI-stable, ATM splice site 2979-8X>Q, CDKN2A loss, CDKN2B loss, DNMT3A Y533C, FGF10 amplification, KRAS G12C, MTAP loss, and RBM10 A414f*216.  Chest CT angiogramon 10/05/2018 revealed a 8.4 x 6.9 cm right lower lobe mass with numerous pleural nodules and a large right pleural effusion. There was bony destruction of the right lateral 7th and 8th ribs. There was no pulmonary embolism.  Head MRIon 10/07/2018 revealed no metastatic disease or acute intracranial abnormality.There was distal left vertebral artery with poor flow or occlusion, most likely due to atherosclerosis.  PET scanon 10/18/2018 revealed a large hypermetabolic RIGHT lower lobe mass consistent with bronchogenic carcinoma. There was multifocal pleural metastasis within the RIGHT hemithorax. There was one pleural metastatic lesion invading adjacent RIGHT seventh rib, and moderate RIGHT effusion. There was probable RIGHT hilar metastatic adenopathy, and no mediastinal or supraclavicular metastatic adenopathy.   He is s/pthoracentesisx 2 (10/06/2018 and10/05/2018).CT biopsyof the right lower lobelung masson 10/31/2018 was performed for mutational studies.  He is s/p cycle #1 Alimta and pembrolizumabon 11/03/2018.He iss/p8cycles ofpembrolizumabalone (11/24/2018-04/27/2019).  Chest CTon 02/12/2019 revealed interval improvement in the patient's malignancy. The primary mass was3.9 x 3.6 cm (previously 5.7 x 7.5 cm). The right pleural nodularityhadimproved insize andnumberof lesions(nodularity abutting right 5th rib was1.5 x 0.5 cm; previously 2.9 x 1.4 cm).  There were sclerotic changes  in multiple right-sided ribsc/wtreated metastatic disease. There was no right hilar adenopathy. The right pleural effusion was smaller. There was severe emphysematous changes in the lungs. There was no metastatic disease below the diaphragm.   Bone scanon 02/14/2019 revealed a linear bandlike uptake along the inferior aspect of the L3 vertebral body. This would not be typical for metastatic disease and may be related to compression fracture. Lumbar spine MRI could be used to further evaluate. There was radiotracer accumulation in the lateral right fifth and seventh ribsc/wknown metastatic disease as characterized on previous chest CT and PET-CT. There was no unexpected uptake in the bony pelvis.  Lumbar spine MRIon 02/15/2019 revealed amild inferior endplate compression fracture of L3with vertebral body height loss of up to approximately 30% has an appearance most c/wa senile osteoporotic injury.There was no evidencefor metastatic disease. There was partial visualization of a right pleural effusion. The patient had a right effusion on the prior chest CT.There was an approximately 3.6 cm abdominal aortic aneurysm(chronic).He underwent L3 kyphoplastyon 02/20/2019.  Chest CT on 05/15/2019 revealed slight interval decrease in size and fullness of a spiculated infrahilar mass of the right lower lobe, with extensive postobstructive consolidation of the right lung base and a small right pleural effusion. There was interval increase in size in multiple subpleural soft tissue nodules about the right lung c/w worsened pleural metastatic disease.  There were unchanged sclerotic rib lesions.   He has a history of renal insufficiency on 05/28/2019.  Renal ultrasound on 05/28/2019 showed bilateral simple renal cysts without hydronephrosis.  He received the influenza vaccineon 10/13/2018.  He received his second COVID-19 vaccine on 04/19/2019.  Symptomatically, performance status is marginal.   He is "sleepy" and at times "talks out of his head".    Plan: 1.   Labs today:  CBC with diff, CMP, Mg. 2.   Stage IV adenocarcinoma of the lung Clinically, he is doing fair.             Patient iss/p 1 cycle of Alimta and pembrolizumab. Patient is s/p8cycles ofpembrolizumab alone(last04/02/2019). Chest CT on 04/27/2019 revealed a mixed response with slight decrease in the right infrahilar mass and increase in subpleural nodules in the right lung.             Pembrolizumab was discontinued (last dose on 04/27/2019).             Review current plan for Alimta every 3 weeks.                         He is on folic acid 1 mg/day.                         He received B12 on 05/18/2019 (continue every 9 weeks).                         Patient took Decadron yesterday.             Discuss plan to hold treatment today. 3. Renal insufficiency             Creatinine 1.12 today.             Baseline creatinine 0.82 - 1.01.             Renal ultrasound revealed no hydronephrosis.             Encourage fluids.  Nephrology  consult. 4.   Bone metastasis Patienthasright lower ribmetastasis seen on chest CT. Bone scan on 02/14/2019 revealed only lateral right 5th and 7th ribs c/w metastatic disease. Xgeva on hold secondary to renal issues. 5.Cancer-related pain Pain is controlled. 6.   Weight loss             Weight is stable.  Encourage caloric intake.  Monitor closely. 7.   Altered mental status  Concern for underlying dementia.  Doubt brain metastasis.  Discuss plan for CNS imaging.  Neurology consult- contacted. 8.   RTC on 06/14/2019 for MD assessment, labs (CBC with diff, CMP), and +/- cycle #1 Alimta.  I discussed the assessment and treatment plan with the patient.  The patient was provided an opportunity to ask questions and all were answered.  The patient agreed with the  plan and demonstrated an understanding of the instructions.  The patient was advised to call back if the symptoms worsen or if the condition fails to improve as anticipated.  I provided 20 minutes of face-to-face time during this this encounter and > 50% was spent counseling as documented under my assessment and plan.    Lequita Asal, MD, PhD    06/04/2019, 1:39 PM  I, Selena Batten, am acting as scribe for [employer]. Mike Gip, MD, PhD.  I, Melissa C. Mike Gip, MD, have reviewed the above documentation for accuracy and completeness, and I agree with the above.

## 2019-06-01 ENCOUNTER — Other Ambulatory Visit: Payer: Self-pay

## 2019-06-01 ENCOUNTER — Telehealth: Payer: Self-pay | Admitting: *Deleted

## 2019-06-01 ENCOUNTER — Emergency Department
Admission: EM | Admit: 2019-06-01 | Discharge: 2019-06-01 | Disposition: A | Discharge status: Home / Self Care | Payer: Medicare HMO | Attending: Emergency Medicine | Admitting: Emergency Medicine

## 2019-06-01 ENCOUNTER — Encounter: Payer: Self-pay | Admitting: Hematology and Oncology

## 2019-06-01 DIAGNOSIS — K5900 Constipation, unspecified: Secondary | ICD-10-CM

## 2019-06-01 MED ORDER — DOCUSATE SODIUM 50 MG/5ML PO LIQD
100.0000 mg | ORAL | Status: AC
  Administered 2019-06-01: 100 mg
  Filled 2019-06-01: qty 10

## 2019-06-01 MED ORDER — LACTULOSE 10 GM/15ML PO SOLN
20.0000 g | Freq: Once | ORAL | Status: AC
  Administered 2019-06-01: 20 g via ORAL
  Filled 2019-06-01: qty 30

## 2019-06-01 MED ORDER — MAGNESIUM CITRATE PO SOLN
1.0000 | Freq: Once | ORAL | Status: AC
  Administered 2019-06-01: 1 via ORAL
  Filled 2019-06-01: qty 296

## 2019-06-01 MED ORDER — LIDOCAINE VISCOUS HCL 2 % MT SOLN
15.0000 mL | Freq: Once | OROMUCOSAL | Status: AC
  Administered 2019-06-01: 15 mL via OROMUCOSAL
  Filled 2019-06-01: qty 15

## 2019-06-01 NOTE — ED Provider Notes (Signed)
[facility] Emergency Department Provider Note  ____________________________________________   First MD Initiated Contact with Patient 06/01/19 [id number]     (approximate)  I have reviewed the triage vital signs and the nursing notes.   HISTORY  Chief Complaint Constipation    HPI Andres RIBAUDO is a 86 y.o. male with medical history as listed below which notably includes stage IV lung cancer and who is currently on chemotherapy every 3 weeks, managed by Dr. Mike Gip the oncologist.  He presents with his wife tonight for evaluation of about a week of constipation.  His wife says that she has not been keeping good track of his bowel movements but he has been using his usual bowel regimen which includes, today alone, at least 3 Colace, 2 Ex-Lax, and a dose of MiraLAX.  In spite of this he has not had a normal bowel movement in about a week and he feels distended with abdominal pain and rectal pain when he tries to have a bowel movement.  He said that he can feel hard stool when he feels up in side with a finger but he cannot move it out.  He has had some passage of liquid stool but only a small amount.  He denies fever/chills, sore throat, chest pain, acute shortness of breath, nausea, and vomiting.  Nothing in particular is making his symptoms better or worse.  He has suffered from constipation in the past and he has come to the emergency department for fecal disimpaction before.   They described the symptoms as severe.        Past Medical History:  Diagnosis Date  . BPV (benign positional vertigo)   . Cancer ([employer])    lung cancer stage IV  . Chronic constipation   . Collapsed lung 1959   right  . Diabetes mellitus without complication (HCC)    borderline  . History of hiatal hernia   . Hyperlipidemia   . Hypertension     Patient Active Problem List   Diagnosis Date Noted  . Renal insufficiency 05/28/2019  . Hypokalemia 04/05/2019  . Compression  fracture of lumbar vertebra with routine healing 02/22/2019  . Lumbar pain 02/17/2019  . Migraines 02/14/2019  . Jaundice 02/14/2019  . Hypertension 02/14/2019  . Hyperlipidemia 02/14/2019  . Hiatal hernia 02/14/2019  . Collapsed lung 02/14/2019  . Allergy 02/14/2019  . Pressure injury of sacral region, stage 1 01/21/2019  . Weight loss 12/15/2018  . Malignant pleural effusion 12/07/2018  . Pain due to malignant neoplasm metastatic to bone ([place]) 12/07/2018  . Constipation 12/07/2018  . Dehydration 11/26/2018  . Encounter for antineoplastic chemotherapy 11/03/2018  . Encounter for antineoplastic immunotherapy 11/03/2018  . Cancer ([place])   . Palliative care encounter   . COPD exacerbation ([place]) 10/28/2018  . Primary adenocarcinoma of lower lobe of right lung ([place]) 10/13/2018  . Bone metastasis ([place]) 10/13/2018  . Goals of care, counseling/discussion 10/13/2018  . Cancer-related pain 10/13/2018  . Pleural effusion 10/05/2018  . Diabetes mellitus type 2, uncomplicated ([place]) 76/19/5093    Past Surgical History:  Procedure Laterality Date  . CATARACT EXTRACTION, BILATERAL    . COLONOSCOPY    . ESOPHAGOGASTRODUODENOSCOPY    . EYE SURGERY    . KYPHOPLASTY N/A 02/20/2019   Procedure: L3 KYPHOPLASTY;  Surgeon: Hessie Knows, MD;  Location: [facility] ORS;  Service: Orthopedics;  Laterality: N/A;  . PORTA CATH INSERTION N/A 11/27/2018   Procedure: PORTA CATH INSERTION;  Surgeon: Algernon Huxley, MD;  Location: [facility];  Service: Cardiovascular;  Laterality: N/A;    Prior to Admission medications   Medication Sig Start Date End Date Taking? Authorizing Provider  amLODipine (NORVASC) 2.5 MG tablet Take 2.5 mg by mouth daily.    [provider]  aspirin EC 81 MG tablet Take 81 mg by mouth daily.    [provider]  citalopram (CELEXA) 10 MG tablet Take 1 tablet (10 mg total) by mouth daily. May increase to two tablets (20mg  total) after one week if tolerating 03/19/19    Borders, Kirt Boys, NP  dexamethasone (DECADRON) 4 MG tablet Take 1 tab two times a day the day before Alimta chemo. Take 2 tabs the day after chemo, then take 2 tabs two times a day for 2 days. 05/18/19   Lequita Asal, MD  feeding supplement, ENSURE ENLIVE, (ENSURE ENLIVE) LIQD Take 237 mLs by mouth 3 (three) times daily between meals. Patient taking differently: Take 237 mLs by mouth 4 (four) times a week.  10/08/18   Stark Jock Jude, MD  folic acid (FOLVITE) 1 MG tablet Take 1 tablet (1 mg total) by mouth daily. Start 5-7 days before Alimta chemotherapy. Continue until 21 days after Alimta completed. 05/18/19   Lequita Asal, MD  lidocaine-prilocaine (EMLA) cream Apply 1 application topically as needed. Patient not taking: Reported on 03/14/2019 12/04/18   Lequita Asal, MD  oxyCODONE (OXY IR/ROXICODONE) 5 MG immediate release tablet Take 1 tablet every 4 hours as needed for pain.  Keep a pain diary. 02/23/19   Lequita Asal, MD  OXYGEN Inhale into the lungs as needed.    [provider]  polyethylene glycol (MIRALAX / GLYCOLAX) 17 g packet Take 17 g by mouth 2 (two) times daily. Patient not taking: Reported on 04/27/2019 10/31/18   Nicholes Mango, MD  potassium chloride (K-DUR,KLOR-CON) 10 MEQ tablet Take 10 mEq by mouth 2 (two) times daily.    [provider]  pravastatin (PRAVACHOL) 40 MG tablet Take 40 mg by mouth daily.    [provider]  senna (SENOKOT) 8.6 MG TABS tablet Take 1 tablet (8.6 mg total) by mouth daily as needed for mild constipation. Patient not taking: Reported on 04/27/2019 10/08/18   Otila Back, MD  traZODone (DESYREL) 50 MG tablet Take 100 mg by mouth at bedtime.  11/06/18 11/06/19  [provider]  triamterene-hydrochlorothiazide (MAXZIDE-25) 37.5-25 MG tablet Take 1 tablet by mouth daily.    [provider]    Allergies Accupril [quinapril hcl]  Family History  Problem Relation Age of Onset  . Stroke Mother   .  Epilepsy Mother   . Prostate cancer Father     Social History Social History   Tobacco Use  . Smoking status: Former Smoker    Packs/day: 1.00    Years: 25.00    Pack years: 25.00    Types: Cigarettes    Quit date: 1976    Years since quitting: 45.3  . Smokeless tobacco: Never Used  Substance Use Topics  . Alcohol use: Not Currently  . Drug use: Never    Review of Systems Constitutional: No fever/chills Eyes: No visual changes. ENT: No sore throat. Cardiovascular: Denies chest pain. Respiratory: Denies acute shortness of breath. Gastrointestinal: Constipation with some abdominal distention and cramping abdominal pain as well as rectal pain. Genitourinary: Negative for dysuria. Musculoskeletal: Negative for neck pain.  Negative for back pain. Integumentary: Negative for rash. Neurological: Negative for headaches, focal weakness or numbness.  ____________________________________________   PHYSICAL EXAM:  VITAL SIGNS: ED Triage Vitals [05/31/19 2045]  Enc Vitals Group     BP (!) 113/53     Pulse Rate 95     Resp 18     Temp 98.6 F (37 C)     Temp Source Oral     SpO2 95 %     Weight 51.3 kg (113 lb)     Height 1.676 m (5\' 6" )     Head Circumference      Peak Flow      Pain Score 10     Pain Loc      Pain Edu?      Excl. in [place]?     Constitutional: Alert and oriented.  Elderly and deconditioned. Eyes: Conjunctivae are normal.  Head: Atraumatic. Nose: No congestion/rhinnorhea. Mouth/Throat: Patient is wearing a mask. Neck: No stridor.  No meningeal signs.   Cardiovascular: Normal rate, regular rhythm. Good peripheral circulation. Grossly normal heart sounds. Respiratory: Normal respiratory effort.  No retractions. Gastrointestinal/Rectal: Soft and nontender.  Nondistended.  Rectal exam is notable for a large solid ball of stool in the rectal vault.  See procedure note and hospital course for additional details.  ED nurse Valetta Fuller present throughout exam and  procedure. Musculoskeletal: No lower extremity tenderness nor edema. No gross deformities of extremities. Neurologic:  Normal speech and language. No gross focal neurologic deficits are appreciated.  Skin:  Skin is warm, dry and intact. Psychiatric: Mood and affect are normal. Speech and behavior are normal.  ____________________________________________   LABS (all labs ordered are listed, but only abnormal results are displayed)  Labs Reviewed - No data to display ____________________________________________  EKG  No indication for emergent EKG ____________________________________________  RADIOLOGY I, Hinda Kehr, personally viewed and evaluated these images (plain radiographs) as part of my medical decision making, as well as reviewing the written report by the radiologist.  ED MD interpretation: Large amount of rectal stool on radiograph.  Official radiology report(s): DG Abdomen 1 View  Result Date: 05/31/2019 CLINICAL DATA:  Constipation EXAM: ABDOMEN - 1 VIEW COMPARISON:  10/27/2018 FINDINGS: There is a large amount of stool, especially at the level of the rectum. The patient is status post prior vertebral augmentation of the L3 vertebral body. There is small punctate calcifications projecting over both kidneys which may represent small nephroliths. There are degenerative changes of the lumbar spine and hips. Osteopenia is noted. IMPRESSION: 1. Large amount of stool at the level of the rectum. 2. Probable small bilateral nephroliths. 3. Osteopenia with prior vertebral augmentation of the L3 vertebral body. Electronically Signed   By: Constance Holster M.D.   On: 05/31/2019 21:09    ____________________________________________   PROCEDURES   Procedure(s) performed (including Critical Care):  Fecal disimpaction  Date/Time: 06/01/2019 2:12 AM Performed by: Hinda Kehr, MD Authorized by: Hinda Kehr, MD  Consent: Verbal consent obtained. Risks and benefits: risks,  benefits and alternatives were discussed Consent given by: patient and spouse Patient understanding: patient states understanding of the procedure being performed Patient identity confirmed: verbally with patient and arm band Local anesthesia used: yes  Anesthesia: Local anesthesia used: yes Local anesthetic: viscous lidocaine 2% as lubricant and anesthetic.  Sedation: Patient sedated: no  Patient tolerance: patient tolerated the procedure well with no immediate complications Comments: Moderate amount of solid stool was manually removed but with additional stool more proximally palpable but unable to be removed.      ____________________________________________   INITIAL IMPRESSION / MDM /  ASSESSMENT AND PLAN / ED COURSE  As part of my medical decision making, I reviewed the following data within the [place] History obtained from family, Nursing notes reviewed and incorporated, Old chart reviewed, Radiograph reviewed  and Notes from prior ED visits   Differential diagnosis includes, but is not limited to, constipation, drug-induced constipation, electrolyte or metabolic abnormality, SBO/ileus.  The presentation and radiograph and history are all consistent with slow transit or drug-induced constipation.  I offered several options including enema, oral medications, or manual fecal disimpaction.  The patient wanted to proceed with my attempt at disimpaction and I was moderately successful.  However I could feel some additional stool up higher in the rectal vault that I could not reach with my fingers so we agreed to proceed with an enema (soapsuds enema with magnesium citrate and liquid Colace added).  We will give him a chance to see if this works and then reassess.  He does not have an acute abdomen at this time and additional imaging is not indicated.       Clinical Course as of May 31 432  Fri Jun 01, 2019  0222 Patient was unable to retain the enema for more  than about 30 seconds.  It does not appear that enema treatment will be particularly effective.  He had half a bottle of magnesium citrate 150 mL in total, and I am not giving him a dose of lactulose 20 g by mouth.  I anticipate discharge with aggressive bowel regimen plan and outpatient follow-up.   [CF]  [phone] The patient has passed a little bit of stool although not much.  However he said he feels "like a new person" after the manual fecal disimpaction and they are ready to go home and follow-up as an outpatient.  I gave my usual and customary recommendations and return precautions.   [CF]    Clinical Course User Index [CF] Hinda Kehr, MD     ____________________________________________  FINAL CLINICAL IMPRESSION(S) / ED DIAGNOSES  Final diagnoses:  Constipation, unspecified constipation type     MEDICATIONS GIVEN DURING THIS VISIT:  Medications  lidocaine (XYLOCAINE) 2 % viscous mouth solution 15 mL (15 mLs Mouth/Throat Given by Other 06/01/19 0124)  docusate (COLACE) 50 MG/5ML liquid 100 mg (100 mg Per Tube Given 06/01/19 0209)  magnesium citrate solution 1 Bottle (1 Bottle Oral Given 06/01/19 0209)  lactulose (CHRONULAC) 10 GM/15ML solution 20 g (20 g Oral Given 06/01/19 0236)     ED Discharge Orders    None      *Please note:  ESTHER BROYLES was evaluated in Emergency Department on 06/01/2019 for the symptoms described in the history of present illness. He was evaluated in the context of the global COVID-19 pandemic, which necessitated consideration that the patient might be at risk for infection with the SARS-CoV-2 virus that causes COVID-19. Institutional protocols and algorithms that pertain to the evaluation of patients at risk for COVID-19 are in a state of rapid change based on information released by regulatory bodies including the CDC and federal and state organizations. These policies and algorithms were followed during the patient's care in the ED.  Some ED evaluations  and interventions may be delayed as a result of limited staffing during the pandemic.*  Note:  This document was prepared using Dragon voice recognition software and may include unintentional dictation errors.   Hinda Kehr, MD 06/01/19 [phone]

## 2019-06-01 NOTE — Telephone Encounter (Signed)
Has this been addressed?  Faythe Casa, NP 06/01/2019 9:11 AM

## 2019-06-01 NOTE — Telephone Encounter (Signed)
Called to check on patient and see how he was feeling. Spoke to patient's wife who said they went to the ED last night because his "bowels was impacted". He is at home resting now. I offered her an appointment to bring him in, but she refused and thanked me for checking on him. Encouraged her to call me back if she felt like he needed to come in for any reason. She verbalized understanding.

## 2019-06-01 NOTE — Telephone Encounter (Signed)
Hey Doni, Can you call this patient to see if he is any better.   Faythe Casa, NP 06/01/2019 10:30 AM

## 2019-06-01 NOTE — Discharge Instructions (Signed)
You were seen in the emergency department today for constipation.  Please continue using the bowel regimen you are currently on, but I recommend you increase the amount of MiraLAX you are taking to twice daily with plenty of fluids including juice (consider prune juice).  I recommend you follow-up with your primary care doctor or with Dr. Mike Gip to see if they have additional recommendations.  Please return to the Emergency Department immediately if you develop new or worsening symptoms that concern you, such as (but not limited to) fever > 101 degrees, severe abdominal pain, or persistent vomiting.

## 2019-06-01 NOTE — Telephone Encounter (Signed)
Not that I can see in his chart

## 2019-06-01 NOTE — Progress Notes (Signed)
The patient was inpacted with stool and was seen in the ED. The patient wife reports the patient has been talking out of his head x 2-3 days( he keep telling her that there are people in the yard and she need to help them. The patient name and DOB has been verified by phone today.

## 2019-06-01 NOTE — Telephone Encounter (Signed)
Tried to reach patient and left voice mail message for him to call me back.

## 2019-06-04 ENCOUNTER — Inpatient Hospital Stay: Payer: Medicare HMO

## 2019-06-04 ENCOUNTER — Inpatient Hospital Stay (HOSPITAL_BASED_OUTPATIENT_CLINIC_OR_DEPARTMENT_OTHER): Payer: Medicare HMO | Admitting: Hematology and Oncology

## 2019-06-04 ENCOUNTER — Encounter: Payer: Self-pay | Admitting: Hematology and Oncology

## 2019-06-04 ENCOUNTER — Other Ambulatory Visit: Payer: Self-pay

## 2019-06-04 ENCOUNTER — Telehealth: Payer: Self-pay

## 2019-06-04 VITALS — BP 118/60 | HR 64 | Temp 97.9°F | Resp 18 | Ht 66.0 in | Wt 137.0 lb

## 2019-06-04 DIAGNOSIS — C7951 Secondary malignant neoplasm of bone: Secondary | ICD-10-CM

## 2019-06-04 DIAGNOSIS — G893 Neoplasm related pain (acute) (chronic): Secondary | ICD-10-CM | POA: Diagnosis not present

## 2019-06-04 DIAGNOSIS — Z7189 Other specified counseling: Secondary | ICD-10-CM

## 2019-06-04 DIAGNOSIS — N289 Disorder of kidney and ureter, unspecified: Secondary | ICD-10-CM

## 2019-06-04 DIAGNOSIS — C3431 Malignant neoplasm of lower lobe, right bronchus or lung: Secondary | ICD-10-CM

## 2019-06-04 DIAGNOSIS — Z5111 Encounter for antineoplastic chemotherapy: Secondary | ICD-10-CM

## 2019-06-04 DIAGNOSIS — R410 Disorientation, unspecified: Secondary | ICD-10-CM

## 2019-06-04 LAB — CBC WITH DIFFERENTIAL/PLATELET
Abs Immature Granulocytes: 0.07 10*3/uL (ref 0.00–0.07)
Basophils Absolute: 0 10*3/uL (ref 0.0–0.1)
Basophils Relative: 0 %
Eosinophils Absolute: 0 10*3/uL (ref 0.0–0.5)
Eosinophils Relative: 0 %
HCT: 38.5 % — ABNORMAL LOW (ref 39.0–52.0)
Hemoglobin: 13.2 g/dL (ref 13.0–17.0)
Immature Granulocytes: 1 %
Lymphocytes Relative: 16 %
Lymphs Abs: 1.2 10*3/uL (ref 0.7–4.0)
MCH: 32.6 pg (ref 26.0–34.0)
MCHC: 34.3 g/dL (ref 30.0–36.0)
MCV: 95.1 fL (ref 80.0–100.0)
Monocytes Absolute: 0.6 10*3/uL (ref 0.1–1.0)
Monocytes Relative: 9 %
Neutro Abs: 5.2 10*3/uL (ref 1.7–7.7)
Neutrophils Relative %: 74 %
Platelets: 147 10*3/uL — ABNORMAL LOW (ref 150–400)
RBC: 4.05 MIL/uL — ABNORMAL LOW (ref 4.22–5.81)
RDW: 13.1 % (ref 11.5–15.5)
WBC: 7.1 10*3/uL (ref 4.0–10.5)
nRBC: 0 % (ref 0.0–0.2)

## 2019-06-04 LAB — COMPREHENSIVE METABOLIC PANEL
ALT: 34 U/L (ref 0–44)
AST: 34 U/L (ref 15–41)
Albumin: 3.9 g/dL (ref 3.5–5.0)
Alkaline Phosphatase: 50 U/L (ref 38–126)
Anion gap: 10 (ref 5–15)
BUN: 35 mg/dL — ABNORMAL HIGH (ref 8–23)
CO2: 29 mmol/L (ref 22–32)
Calcium: 9.3 mg/dL (ref 8.9–10.3)
Chloride: 96 mmol/L — ABNORMAL LOW (ref 98–111)
Creatinine, Ser: 1.12 mg/dL (ref 0.61–1.24)
GFR calc Af Amer: >60 mL/min (ref >60)
GFR calc non Af Amer: 59 mL/min — ABNORMAL LOW (ref >60)
Glucose, Bld: 119 mg/dL — ABNORMAL HIGH (ref 70–99)
Potassium: 3.8 mmol/L (ref 3.5–5.1)
Sodium: 135 mmol/L (ref 135–145)
Total Bilirubin: 0.6 mg/dL (ref 0.3–1.2)
Total Protein: 7.4 g/dL (ref 6.5–8.1)

## 2019-06-04 LAB — MAGNESIUM: Magnesium: 2.4 mg/dL (ref 1.7–2.4)

## 2019-06-04 MED ORDER — HEPARIN SOD (PORK) LOCK FLUSH 100 UNIT/ML IV SOLN
500.0000 [IU] | Freq: Once | INTRAVENOUS | Status: AC
  Administered 2019-06-04: 500 [IU] via INTRAVENOUS
  Filled 2019-06-04: qty 5

## 2019-06-04 MED ORDER — SODIUM CHLORIDE 0.9% FLUSH
10.0000 mL | INTRAVENOUS | Status: DC | PRN
  Administered 2019-06-04: 10 mL via INTRAVENOUS
  Filled 2019-06-04: qty 10

## 2019-06-04 NOTE — Telephone Encounter (Signed)
Faxed New patient referral to Dr. Holley Raring and Dr Manuella Ghazi office. Fax conformation confirmed.  Demography and Insurance card has been faxed with orders.

## 2019-06-07 ENCOUNTER — Ambulatory Visit: Payer: Medicare HMO | Admitting: Hematology and Oncology

## 2019-06-08 DIAGNOSIS — R413 Other amnesia: Secondary | ICD-10-CM | POA: Insufficient documentation

## 2019-06-12 ENCOUNTER — Inpatient Hospital Stay (HOSPITAL_BASED_OUTPATIENT_CLINIC_OR_DEPARTMENT_OTHER): Payer: Medicare HMO | Admitting: Hospice and Palliative Medicine

## 2019-06-12 ENCOUNTER — Other Ambulatory Visit: Payer: Self-pay

## 2019-06-12 DIAGNOSIS — C3431 Malignant neoplasm of lower lobe, right bronchus or lung: Secondary | ICD-10-CM | POA: Diagnosis not present

## 2019-06-12 DIAGNOSIS — Z515 Encounter for palliative care: Secondary | ICD-10-CM

## 2019-06-12 NOTE — Progress Notes (Signed)
[facility]  [address], [address] [place], [place] [address] Phone: [phone]  Fax: [phone]   Clinic Day:  06/14/2019  Referring physician: [name], MD  Chief Complaint: Andres Hebert is a 86 y.o. male with stage IV adenocarcinoma of therightlung who is seen for assessment prior to cycle #1 Alimta.  HPI: The patient was last seen in the medical oncology clinic on 06/04/2019. At that time, he had recently been seen in the ER for constipation.  He was sleeping poorly.  He was clear on past events, but was having difficulty remembering recent events.  He had some visual hallucinations.  Chemotherapy was held.  Creatinine was 1.12 (CrCl 41.6 ml/min).  He was referred to nephrology and neurology.  Hematocrit was 38.5, hemoglobin 13.2, platelets 147,000, WBC 7,100. CMP was normal.  The patient had an initial consult with Dr. Lannie Fields NP Chipper Herb on 06/08/2019. His memory had been worsening since 09/2018. His wife noted he was having visual hallucinations. He was inactive to due fatigue. MME exam was 17/30.  Dr. Melrose Nakayama discussed memory and hallucination medication. Head MRI is scheduled in 06/2019.  Patient will follow up in Chipper Herb, NP in 3-4 weeks.   The patient had a vitural visit with Altha Harm, NP on 06/12/2019. He was doing about the same. She stated that his anxiety was stable with once a day citalopram. No other significant changes or concerns were reported. Follow up planned for 1 to 2 months.   During the interim, the patient felt "pretty good". His weight is down 4 more pounds since 06/04/2019. He is eating well. He notes being constipated a few days ago and resolved after his wife gave him stool softeners. The patient will see the nephrologist in 07/02/2019.  I discussed an assessment by Dr. Holley Raring due to a decreasing kidney function.    Symptomatically, the patient is laying in bed or sitting in his chair during the day. He is  still sleeping majority of the day. Yesterday he sat on the porch for a while. His wife states that she is trying to have him eat and drink more fluids. She notes he is cough with sputum productions.  He has back pain. He reports having trouble sleeping at night and then sleeps during the day.    Past Medical History:  Diagnosis Date  . BPV (benign positional vertigo)   . Cancer ([place])    lung cancer stage IV  . Chronic constipation   . Collapsed lung 1959   right  . Diabetes mellitus without complication (HCC)    borderline  . History of hiatal hernia   . Hyperlipidemia   . Hypertension     Past Surgical History:  Procedure Laterality Date  . CATARACT EXTRACTION, BILATERAL    . COLONOSCOPY    . ESOPHAGOGASTRODUODENOSCOPY    . EYE SURGERY    . KYPHOPLASTY N/A 02/20/2019   Procedure: L3 KYPHOPLASTY;  Surgeon: Hessie Knows, MD;  Location: [facility] ORS;  Service: Orthopedics;  Laterality: N/A;  . PORTA CATH INSERTION N/A 11/27/2018   Procedure: PORTA CATH INSERTION;  Surgeon: Algernon Huxley, MD;  Location: [facility];  Service: Cardiovascular;  Laterality: N/A;    Family History  Problem Relation Age of Onset  . Stroke Mother   . Epilepsy Mother   . Prostate cancer Father     Social History:  reports that he quit smoking about 45 years ago. His smoking use included cigarettes. He has a  25.00 pack-year smoking history. He has never used smokeless tobacco. He reports previous alcohol use. He reports that he does not use drugs. He stoppedsmoking about 45 years ago.He denies any exposure to asbestos, radiation or toxins.He worked for [employer] and [employer] recently lost his son. He lives in [place].His wife's name isSheila. The patient is accompanied by his wife via [place] today.  Allergies:  Allergies  Allergen Reactions  . Accupril [Quinapril Hcl] Other (See Comments)    dizziness per MR    Current Medications: Current Outpatient Medications  Medication Sig Dispense Refill   . amLODipine (NORVASC) 2.5 MG tablet Take 2.5 mg by mouth daily.    Marland Kitchen aspirin EC 81 MG tablet Take 81 mg by mouth daily.    . citalopram (CELEXA) 10 MG tablet Take 1 tablet (10 mg total) by mouth daily. May increase to two tablets ('20mg'$  total) after one week if tolerating 45 tablet 0  . dexamethasone (DECADRON) 4 MG tablet Take 1 tab two times a day the day before Alimta chemo. Take 2 tabs the day after chemo, then take 2 tabs two times a day for 2 days. 30 tablet 1  . feeding supplement, ENSURE ENLIVE, (ENSURE ENLIVE) LIQD Take 237 mLs by mouth 3 (three) times daily between meals. (Patient taking differently: Take 237 mLs by mouth 4 (four) times a week. ) 086 mL 0  . folic acid (FOLVITE) 1 MG tablet Take 1 tablet (1 mg total) by mouth daily. Start 5-7 days before Alimta chemotherapy. Continue until 21 days after Alimta completed. 100 tablet 3  . oxyCODONE (OXY IR/ROXICODONE) 5 MG immediate release tablet Take 1 tablet every 4 hours as needed for pain.  Keep a pain diary. 40 tablet 0  . OXYGEN Inhale into the lungs as needed.    . polyethylene glycol (MIRALAX / GLYCOLAX) 17 g packet Take 17 g by mouth 2 (two) times daily. 14 each 0  . potassium chloride (K-DUR,KLOR-CON) 10 MEQ tablet Take 10 mEq by mouth 2 (two) times daily.    . pravastatin (PRAVACHOL) 40 MG tablet Take 40 mg by mouth daily.    Marland Kitchen senna (SENOKOT) 8.6 MG TABS tablet Take 1 tablet (8.6 mg total) by mouth daily as needed for mild constipation. 30 tablet 0  . traZODone (DESYREL) 50 MG tablet Take 100 mg by mouth at bedtime.     . triamterene-hydrochlorothiazide (MAXZIDE-25) 37.5-25 MG tablet Take 1 tablet by mouth daily.    Marland Kitchen lidocaine-prilocaine (EMLA) cream Apply 1 application topically as needed. (Patient not taking: Reported on 03/14/2019) 30 g 1   No current facility-administered medications for this visit.    Review of Systems  Constitutional: Positive for malaise/fatigue and weight loss (4 lbs). Negative for chills,  diaphoresis and fever.       Feels "pretty good". In bed/sitting majority of the day.  HENT: Negative for congestion, ear discharge, ear pain, hearing loss, nosebleeds, sinus pain, sore throat and tinnitus.   Eyes: Negative for blurred vision.  Respiratory: Positive for cough (spitting up bubbles) and sputum production. Negative for hemoptysis and shortness of breath.   Cardiovascular: Negative for chest pain, palpitations and leg swelling.  Gastrointestinal: Negative for abdominal pain, blood in stool, constipation, diarrhea, heartburn, melena, nausea and vomiting.       Poor fluid intake. Eating well.  Genitourinary: Positive for frequency (x 4-5 per day). Negative for dysuria, hematuria and urgency.  Musculoskeletal: Positive for back pain. Negative for joint pain, myalgias and neck pain.  Skin:  Negative for itching and rash.  Neurological: Positive for weakness (generalized). Negative for dizziness, tingling, sensory change and headaches.  Endo/Heme/Allergies: Does not bruise/bleed easily.  Psychiatric/Behavioral: Positive for memory loss. Negative for depression. The patient has insomnia. The patient is not nervous/anxious.   All other systems reviewed and are negative.  Performance status (ECOG): 2  Vitals Blood pressure 110/74, pulse 77, temperature (!) 95.8 F (35.4 C), temperature source Tympanic, resp. rate 16, weight 133 lb 4.3 oz (60.5 kg), SpO2 97 %.   Physical Exam  Constitutional: He is oriented to person, place, and time. He appears well-developed and well-nourished. No distress.  Thin gentleman sitting comfortably in a wheelchair.  HENT:  Head: Normocephalic and atraumatic.  Mouth/Throat: Oropharynx is clear and moist. No oropharyngeal exudate.  Cap.  Gray hair and beard. Mask.   Eyes: Pupils are equal, round, and reactive to light. Conjunctivae and EOM are normal. No scleral icterus.  Glasses. Blue eyes.  Cardiovascular: Normal rate, regular rhythm and normal heart  sounds.  No murmur heard. Pulmonary/Chest: Effort normal and breath sounds normal. No respiratory distress. He has no wheezes. He has no rales. He exhibits no tenderness.  Abdominal: Soft. Bowel sounds are normal. He exhibits no distension and no mass. There is no abdominal tenderness. There is no rebound and no guarding.  Musculoskeletal:        General: No tenderness or edema. Normal range of motion.     Cervical back: Normal range of motion and neck supple.  Lymphadenopathy:    He has no cervical adenopathy.    He has no axillary adenopathy.       Right: No supraclavicular adenopathy present.       Left: No supraclavicular adenopathy present.  Neurological: He is alert and oriented to person, place, and time.  Skin: Skin is warm and dry. He is not diaphoretic.  Psychiatric: He has a normal mood and affect. His behavior is normal. Judgment and thought content normal.  Nursing note and vitals reviewed.   Infusion on 06/14/2019  Component Date Value Ref Range Status  . Sodium 06/14/2019 133* 135 - 145 mmol/L Final  . Potassium 06/14/2019 3.8  3.5 - 5.1 mmol/L Final  . Chloride 06/14/2019 95* 98 - 111 mmol/L Final  . CO2 06/14/2019 28  22 - 32 mmol/L Final  . Glucose, Bld 06/14/2019 234* 70 - 99 mg/dL Final   Glucose reference range applies only to samples taken after fasting for at least 8 hours.  . BUN 06/14/2019 26* 8 - 23 mg/dL Final  . Creatinine, Ser 06/14/2019 1.35* 0.61 - 1.24 mg/dL Final  . Calcium 06/14/2019 9.0  8.9 - 10.3 mg/dL Final  . Total Protein 06/14/2019 6.8  6.5 - 8.1 g/dL Final  . Albumin 06/14/2019 3.6  3.5 - 5.0 g/dL Final  . AST 06/14/2019 20  15 - 41 U/L Final  . ALT 06/14/2019 19  0 - 44 U/L Final  . Alkaline Phosphatase 06/14/2019 56  38 - 126 U/L Final  . Total Bilirubin 06/14/2019 0.7  0.3 - 1.2 mg/dL Final  . GFR calc non Af Amer 06/14/2019 47* >60 mL/min Final  . GFR calc Af Amer 06/14/2019 55* >60 mL/min Final  . Anion gap 06/14/2019 10  5 - 15  Final   Performed at [facility], [address] [place], [place] [address]  . WBC 06/14/2019 7.7  4.0 - 10.5 K/uL Final  . RBC 06/14/2019 4.04* 4.22 - 5.81 MIL/uL Final  .  Hemoglobin 06/14/2019 13.2  13.0 - 17.0 g/dL Final  . HCT 06/14/2019 38.8* 39.0 - 52.0 % Final  . MCV 06/14/2019 96.0  80.0 - 100.0 fL Final  . MCH 06/14/2019 32.7  26.0 - 34.0 pg Final  . MCHC 06/14/2019 34.0  30.0 - 36.0 g/dL Final  . RDW 06/14/2019 13.0  11.5 - 15.5 % Final  . Platelets 06/14/2019 138* 150 - 400 K/uL Final  . nRBC 06/14/2019 0.0  0.0 - 0.2 % Final  . Neutrophils Relative % 06/14/2019 84  % Final  . Neutro Abs 06/14/2019 6.5  1.7 - 7.7 K/uL Final  . Lymphocytes Relative 06/14/2019 12  % Final  . Lymphs Abs 06/14/2019 0.9  0.7 - 4.0 K/uL Final  . Monocytes Relative 06/14/2019 4  % Final  . Monocytes Absolute 06/14/2019 0.3  0.1 - 1.0 K/uL Final  . Eosinophils Relative 06/14/2019 0  % Final  . Eosinophils Absolute 06/14/2019 0.0  0.0 - 0.5 K/uL Final  . Basophils Relative 06/14/2019 0  % Final  . Basophils Absolute 06/14/2019 0.0  0.0 - 0.1 K/uL Final  . Immature Granulocytes 06/14/2019 0  % Final  . Abs Immature Granulocytes 06/14/2019 0.03  0.00 - 0.07 K/uL Final   Performed at [facility], [address] [place], [place] [address]    Assessment:  Andres Hebert is a 86 y.o. male with metastaticadenocarcinoma of thelungs/p right sided thoracentesis on 10/06/2018.Cytologyconfirmed non-small cell carcinoma, favor adenocarcinoma of the lung.He has a 25-30 pack year smoking history.He has clinical stageT4NxM1b.  PD-L1 testing was 80% on 11/09/2018. Foundation Onetesting revealednumber mutational burden (TMB)18 Muts/Mb, MSI-stable, ATM splice site 8416-6A>Y, CDKN2A loss, CDKN2B loss, DNMT3A Y533C, FGF10 amplification, KRAS G12C, MTAP loss, and RBM10 A443f*216.  Chest CT angiogramon 10/05/2018 revealed a 8.4 x 6.9 cm right lower lobe mass with numerous  pleural nodules and a large right pleural effusion. There was bony destruction of the right lateral 7th and 8th ribs. There was no pulmonary embolism.  Head MRIon 10/07/2018 revealed no metastatic disease or acute intracranial abnormality.There was distal left vertebral artery with poor flow or occlusion, most likely due to atherosclerosis.  PET scanon 10/18/2018 revealed a large hypermetabolic RIGHT lower lobe mass consistent with bronchogenic carcinoma. There was multifocal pleural metastasis within the RIGHT hemithorax. There was one pleural metastatic lesion invading adjacent RIGHT seventh rib, and moderate RIGHT effusion. There was probable RIGHT hilar metastatic adenopathy, and no mediastinal or supraclavicular metastatic adenopathy.   He is s/pthoracentesisx 2 (10/06/2018 and10/05/2018).CT biopsyof the right lower lobelung masson 10/31/2018 was performed for mutational studies.  He is s/p cycle #1 Alimta and pembrolizumabon 11/03/2018.He iss/p8cycles ofpembrolizumabalone (11/24/2018-04/27/2019).  Chest CTon 02/12/2019 revealed interval improvement in the patient's malignancy. The primary mass was3.9 x 3.6 cm (previously 5.7 x 7.5 cm). The right pleural nodularityhadimproved insize andnumberof lesions(nodularity abutting right 5th rib was1.5 x 0.5 cm; previously 2.9 x 1.4 cm). There were sclerotic changes in multiple right-sided ribsc/wtreated metastatic disease. There was no right hilar adenopathy. The right pleural effusion was smaller. There was severe emphysematous changes in the lungs. There was no metastatic disease below the diaphragm.   Bone scanon 02/14/2019 revealed a linear bandlike uptake along the inferior aspect of the L3 vertebral body. This would not be typical for metastatic disease and may be related to compression fracture. Lumbar spine MRI could be used to further evaluate. There was radiotracer accumulation in the lateral right  fifth and seventh ribsc/wknown metastatic disease as characterized on  previous chest CT and PET-CT. There was no unexpected uptake in the bony pelvis.  Lumbar spine MRIon 02/15/2019 revealed amild inferior endplate compression fracture of L3with vertebral body height loss of up to approximately 30% has an appearance most c/wa senile osteoporotic injury.There was no evidencefor metastatic disease. There was partial visualization of a right pleural effusion. The patient had a right effusion on the prior chest CT.There was an approximately 3.6 cm abdominal aortic aneurysm(chronic).He underwent L3 kyphoplastyon 02/20/2019.  Chest CTon 05/15/2019 revealed slight interval decrease in size and fullness of a spiculated infrahilar mass of the right lower lobe, with extensive postobstructive consolidation of the right lung base and a small right pleural effusion. There was interval increase in size in multiple subpleural soft tissue nodules about the right lung c/w worsened pleural metastatic disease. There were unchanged sclerotic rib lesions.   He has a history of renal insufficiency on 05/28/2019.  Renal ultrasound on 05/28/2019 showed bilateral simple renal cysts without hydronephrosis.  He received the influenza vaccineon 10/13/2018. He received his second COVID-19 vaccineon 04/19/2019.  Symptomatically, he has issues with his memory and anxiety.  He spends the majority of his day resting.  Creatinine is 1.35  Plan: 1.    Labs today: CBC with diff, CMP, Mg. 2. Stage IV adenocarcinoma of the lung Clinically, he is doing fair. Patient iss/p 1 cycle of Alimta and pembrolizumab. Patient is s/p8cycles ofpembrolizumab alone(last04/02/2019). Chest CT on 04/02/2021revealeda mixed response with slight decrease in the right infrahilar mass and increase in subpleural nodules in the right lung. Pembrolizumabwas  discontinued (last dose on 04/27/2019). Alimta remains on hold secondary to patient's creatinine clearance (< 45 ml/minute).. 3. Renal insufficiency Creatinine 1.35 today. Baseline creatinine 0.82 - 1.01. No evidence of obstruction by renal ultrasound.  Encourage fluids.  Follow-up with Dr. Holley Raring regarding scheduled outpatient consultation. 4.Bone metastasis Patienthasright lower ribmetastasis seen on chest CT. Bone scan on 02/14/2019 revealed only lateral right 5th and 7th ribs c/w metastatic disease.  Xgeva currently on hold 5.Cancer-related pain Pain is well controlled. 6. Weight loss Weight is down 4 pounds.  Discuss importance of hydration and caloric intake. 7.   No treatment as CrCl < 45 ml/min. 8.   RTC in 2 weeks for MD assesments, labs (CBC with diff, CMP), and cycle #1 Almita.  I discussed the assessment and treatment plan with the patient.  The patient was provided an opportunity to ask questions and all were answered.  The patient agreed with the plan and demonstrated an understanding of the instructions.  The patient was advised to call back if the symptoms worsen or if the condition fails to improve as anticipated.  I provided 14 minutes of face-to-face time during this this encounter and > 50% was spent counseling as documented under my assessment and plan.    Lequita Asal, MD, PhD    06/14/2019, 9:18 AM  I, Selena Batten, am acting as scribe for [employer]. Mike Gip, MD, PhD.  I, Lenae Wherley C. Mike Gip, MD, have reviewed the above documentation for accuracy and completeness, and I agree with the above.

## 2019-06-12 NOTE — Progress Notes (Signed)
Virtual Visit via Telephone Note  I connected with Andres Hebert on 06/12/19 at  9:30 AM EDT by telephone and verified that I am speaking with the correct person using two identifiers.   I discussed the limitations, risks, security and privacy concerns of performing an evaluation and management service by telephone and the availability of in person appointments. I also discussed with the patient that there may be a patient responsible charge related to this service. The patient expressed understanding and agreed to proceed.   History of Present Illness: Mr. Andres Hebert is an 86 y.o. male with multiple medical problems including mild cognitive impairment and stage IV adenocarcinoma of the lung with h/o malignant pleural effusion on treatment with Keytruda.  Palliative care was consulted to help address goals and manage ongoing symptoms.   Observations/Objective: Virtual visit attempted.  I spoke with the patient's wife by phone.  She reports that patient is doing about the same.  He has had some visual hallucinations and was referred to neurology with ongoing work-up.  She says that his anxiety is stable with once a day citalopram.  No other significant changes or concerns were reported today.  No other symptomatic complaints.  Patient continues to receive active treatment for his lung cancer.  Assessment and Plan: Stage IV non-small cell lung cancer -on active treatment with pemetrexed.  Followed by Dr. Mike Gip  Neoplasm related pain -history of kyphoplasty.  Stable with as needed oxycodone  Anxiety -citalopram 10 mg daily  Insomnia -continue trazodone nightly  Follow Up Instructions: Follow-up virtual visit in 1 to 2 months   I discussed the assessment and treatment plan with the patient. The patient was provided an opportunity to ask questions and all were answered. The patient agreed with the plan and demonstrated an understanding of the instructions.   The patient was advised to  call back or seek an in-person evaluation if the symptoms worsen or if the condition fails to improve as anticipated.  I provided 5 minutes of non-face-to-face time during this encounter.   Irean Hong, NP

## 2019-06-14 ENCOUNTER — Inpatient Hospital Stay (HOSPITAL_BASED_OUTPATIENT_CLINIC_OR_DEPARTMENT_OTHER): Payer: Medicare HMO | Admitting: Hematology and Oncology

## 2019-06-14 ENCOUNTER — Other Ambulatory Visit: Payer: Self-pay

## 2019-06-14 ENCOUNTER — Encounter: Payer: Self-pay | Admitting: Hematology and Oncology

## 2019-06-14 ENCOUNTER — Inpatient Hospital Stay: Payer: Medicare HMO

## 2019-06-14 VITALS — BP 110/74 | HR 77 | Temp 95.8°F | Resp 16 | Wt 133.3 lb

## 2019-06-14 DIAGNOSIS — C3431 Malignant neoplasm of lower lobe, right bronchus or lung: Secondary | ICD-10-CM

## 2019-06-14 DIAGNOSIS — N289 Disorder of kidney and ureter, unspecified: Secondary | ICD-10-CM

## 2019-06-14 DIAGNOSIS — Z7189 Other specified counseling: Secondary | ICD-10-CM

## 2019-06-14 LAB — CBC WITH DIFFERENTIAL/PLATELET
Abs Immature Granulocytes: 0.03 10*3/uL (ref 0.00–0.07)
Basophils Absolute: 0 10*3/uL (ref 0.0–0.1)
Basophils Relative: 0 %
Eosinophils Absolute: 0 10*3/uL (ref 0.0–0.5)
Eosinophils Relative: 0 %
HCT: 38.8 % — ABNORMAL LOW (ref 39.0–52.0)
Hemoglobin: 13.2 g/dL (ref 13.0–17.0)
Immature Granulocytes: 0 %
Lymphocytes Relative: 12 %
Lymphs Abs: 0.9 10*3/uL (ref 0.7–4.0)
MCH: 32.7 pg (ref 26.0–34.0)
MCHC: 34 g/dL (ref 30.0–36.0)
MCV: 96 fL (ref 80.0–100.0)
Monocytes Absolute: 0.3 10*3/uL (ref 0.1–1.0)
Monocytes Relative: 4 %
Neutro Abs: 6.5 10*3/uL (ref 1.7–7.7)
Neutrophils Relative %: 84 %
Platelets: 138 10*3/uL — ABNORMAL LOW (ref 150–400)
RBC: 4.04 MIL/uL — ABNORMAL LOW (ref 4.22–5.81)
RDW: 13 % (ref 11.5–15.5)
WBC: 7.7 10*3/uL (ref 4.0–10.5)
nRBC: 0 % (ref 0.0–0.2)

## 2019-06-14 LAB — COMPREHENSIVE METABOLIC PANEL
ALT: 19 U/L (ref 0–44)
AST: 20 U/L (ref 15–41)
Albumin: 3.6 g/dL (ref 3.5–5.0)
Alkaline Phosphatase: 56 U/L (ref 38–126)
Anion gap: 10 (ref 5–15)
BUN: 26 mg/dL — ABNORMAL HIGH (ref 8–23)
CO2: 28 mmol/L (ref 22–32)
Calcium: 9 mg/dL (ref 8.9–10.3)
Chloride: 95 mmol/L — ABNORMAL LOW (ref 98–111)
Creatinine, Ser: 1.35 mg/dL — ABNORMAL HIGH (ref 0.61–1.24)
GFR calc Af Amer: 55 mL/min — ABNORMAL LOW (ref >60)
GFR calc non Af Amer: 47 mL/min — ABNORMAL LOW (ref >60)
Glucose, Bld: 234 mg/dL — ABNORMAL HIGH (ref 70–99)
Potassium: 3.8 mmol/L (ref 3.5–5.1)
Sodium: 133 mmol/L — ABNORMAL LOW (ref 135–145)
Total Bilirubin: 0.7 mg/dL (ref 0.3–1.2)
Total Protein: 6.8 g/dL (ref 6.5–8.1)

## 2019-06-14 MED ORDER — HEPARIN SOD (PORK) LOCK FLUSH 100 UNIT/ML IV SOLN
500.0000 [IU] | Freq: Once | INTRAVENOUS | Status: AC
  Administered 2019-06-14: 500 [IU] via INTRAVENOUS
  Filled 2019-06-14: qty 5

## 2019-06-14 NOTE — Progress Notes (Signed)
Patient denies problems/concerns today.   

## 2019-06-15 ENCOUNTER — Other Ambulatory Visit: Payer: Self-pay | Admitting: Acute Care

## 2019-06-15 DIAGNOSIS — R413 Other amnesia: Secondary | ICD-10-CM

## 2019-06-15 DIAGNOSIS — Z859 Personal history of malignant neoplasm, unspecified: Secondary | ICD-10-CM

## 2019-06-28 ENCOUNTER — Inpatient Hospital Stay: Payer: Medicare HMO

## 2019-06-28 ENCOUNTER — Inpatient Hospital Stay: Payer: Medicare HMO | Admitting: Hematology and Oncology

## 2019-06-28 ENCOUNTER — Other Ambulatory Visit: Payer: Medicare HMO

## 2019-06-28 NOTE — Progress Notes (Signed)
Pharmacist Chemotherapy Monitoring - Follow Up Assessment    I verify that I have reviewed each item in the below checklist:   Regimen for the patient is scheduled for the appropriate day and plan matches scheduled date.  Appropriate non-routine labs are ordered dependent on drug ordered.  If applicable, additional medications reviewed and ordered per protocol based on lifetime cumulative doses and/or treatment regimen.   Plan for follow-up and/or issues identified: No  I-vent associated with next due treatment: No  MD and/or nursing notified: No  Andres Hebert 06/28/2019 10:26 AM

## 2019-07-02 ENCOUNTER — Ambulatory Visit: Payer: Medicare HMO

## 2019-07-04 NOTE — Progress Notes (Signed)
[facility]  [address], [address] [place], [place] [address] Phone: [phone]  Fax: [phone]   Clinic Day:  07/05/2019  Referring physician: [name], MD  Chief Complaint: Andres Hebert is a 86 y.o. male with stage IV adenocarcinoma of therightlung who is seen for assessment prior to cycle #1 Alimta.  HPI: The patient was last seen in the medical oncology clinic on 06/14/2019. At that time, he felt "pretty good".  His weight was down 4 pounds spite eating well.  He spent majority of his day resting.  His wife noted that he had trouble sleeping at night then slept during the day.  Hematocrit was 38.8, hemoglobin 13.2, platelets 138,000, WBC 7,700. Sodium was 133. Creatinine was 1.35 (33.6 ml/min).  Prior creatinine ranged between 0.8 2-1.1.  Alimta was held secondary to CrCl < 45 ml/minute.  He was encouraged to drink fluids.  He was scheduled to see Dr. Holley Raring in nephrology for consultation.  He saw Dr Holley Raring in consultation on 07/02/2019.  Patient was noted to have bouts of volume depletion and dehydration.  Nutrition appeared poor.  He was encouraged to increase his fluid intake.  Labs revealed a creatinine of 1.33.  ANA was negative.  SPEP revealed a poorly defined band of restricted protein mobility in the gamma globulins.  During the interim, he stated "I'm here". He has a cough and is spitting up bubbles (chronic). He notes some mild hemoptysis. Yesterday he ate 2 eggs with cheese, a few snap peas and some potatoes. He is drinking Boost 2 times per day. He reports eating enough to feel satisfied; he does not want to gain too much weight.  His wife, [name], notes he is eating smaller meals along with poor fluid intake. Patient and his wife agreed to tracking his fluid intake in ounces or cups at home. [name] agreed to having him speak with Jennet Maduro, RD.   His wife notes that he spends 12-15 hours in bed and some days longer than that. He is  urinating 5 times per day. He has occasional back pain. He remains on folic acid. He took his steroids yesterday. I discussed a new treatment option (gemcitabine) and provided information on the risk and benefits.   His wife notes he says and does things that are "off the wall". She is concerned about his memory worsening and he will undergo a brain MRI on 07/19/2019.    Past Medical History:  Diagnosis Date  . BPV (benign positional vertigo)   . Cancer ([place])    lung cancer stage IV  . Chronic constipation   . Collapsed lung 1959   right  . Diabetes mellitus without complication (HCC)    borderline  . History of hiatal hernia   . Hyperlipidemia   . Hypertension     Past Surgical History:  Procedure Laterality Date  . CATARACT EXTRACTION, BILATERAL    . COLONOSCOPY    . ESOPHAGOGASTRODUODENOSCOPY    . EYE SURGERY    . KYPHOPLASTY N/A 02/20/2019   Procedure: L3 KYPHOPLASTY;  Surgeon: Hessie Knows, MD;  Location: [facility] ORS;  Service: Orthopedics;  Laterality: N/A;  . PORTA CATH INSERTION N/A 11/27/2018   Procedure: PORTA CATH INSERTION;  Surgeon: Algernon Huxley, MD;  Location: [facility];  Service: Cardiovascular;  Laterality: N/A;    Family History  Problem Relation Age of Onset  . Stroke Mother   . Epilepsy Mother   . Prostate cancer Father  Social History:  reports that he quit smoking about 45 years ago. His smoking use included cigarettes. He has a 25.00 pack-year smoking history. He has never used smokeless tobacco. He reports previous alcohol use. He reports that he does not use drugs. He stoppedsmoking about 45 years ago.He denies any exposure to asbestos, radiation or toxins.He worked for [employer] and [place] recently lost his son. He lives in [place].His wife's name isSheila. The patient is accompanied by [name] on the iPad today.   Allergies:  Allergies  Allergen Reactions  . Accupril [Quinapril Hcl] Other (See Comments)    dizziness per MR     Current Medications: Current Outpatient Medications  Medication Sig Dispense Refill  . amLODipine (NORVASC) 2.5 MG tablet Take 2.5 mg by mouth daily.    Marland Kitchen aspirin EC 81 MG tablet Take 81 mg by mouth daily.    . citalopram (CELEXA) 10 MG tablet Take 1 tablet (10 mg total) by mouth daily. May increase to two tablets (71m total) after one week if tolerating 45 tablet 0  . dexamethasone (DECADRON) 4 MG tablet Take 1 tab two times a day the day before Alimta chemo. Take 2 tabs the day after chemo, then take 2 tabs two times a day for 2 days. 30 tablet 1  . feeding supplement, ENSURE ENLIVE, (ENSURE ENLIVE) LIQD Take 237 mLs by mouth 3 (three) times daily between meals. (Patient taking differently: Take 237 mLs by mouth 4 (four) times a week. ) 2366mL 0  . folic acid (FOLVITE) 1 MG tablet Take 1 tablet (1 mg total) by mouth daily. Start 5-7 days before Alimta chemotherapy. Continue until 21 days after Alimta completed. 100 tablet 3  . lidocaine-prilocaine (EMLA) cream Apply 1 application topically as needed. 30 g 1  . oxyCODONE (OXY IR/ROXICODONE) 5 MG immediate release tablet Take 1 tablet every 4 hours as needed for pain.  Keep a pain diary. 40 tablet 0  . OXYGEN Inhale into the lungs as needed.    . polyethylene glycol (MIRALAX / GLYCOLAX) 17 g packet Take 17 g by mouth 2 (two) times daily. 14 each 0  . potassium chloride (K-DUR,KLOR-CON) 10 MEQ tablet Take 10 mEq by mouth 2 (two) times daily.    . pravastatin (PRAVACHOL) 40 MG tablet Take 40 mg by mouth daily.    .Marland Kitchensenna (SENOKOT) 8.6 MG TABS tablet Take 1 tablet (8.6 mg total) by mouth daily as needed for mild constipation. 30 tablet 0  . traZODone (DESYREL) 50 MG tablet Take 100 mg by mouth at bedtime.     . triamterene-hydrochlorothiazide (MAXZIDE-25) 37.5-25 MG tablet Take 1 tablet by mouth daily.    . potassium chloride (KLOR-CON) 10 MEQ tablet Take 10 mEq by mouth 2 (two) times daily.     No current facility-administered medications  for this visit.    Review of Systems  Constitutional: Positive for malaise/fatigue (in bed 12-15 hrs per day) and weight loss (5 lbs). Negative for chills, diaphoresis and fever.       "I'm here". In bed/sitting majority of the day.   HENT: Negative for congestion, ear discharge, ear pain, hearing loss, nosebleeds, sinus pain, sore throat and tinnitus.   Eyes: Negative for blurred vision.  Respiratory: Positive for cough, hemoptysis (mild) and sputum production (spitting up bubbles). Negative for shortness of breath.   Cardiovascular: Negative for chest pain, palpitations and leg swelling.  Gastrointestinal: Negative for abdominal pain, blood in stool, constipation, diarrhea, heartburn, melena, nausea and vomiting.  Eating smaller meals. Early satiety. Drinking boost BID. Poor fluid intake.  Genitourinary: Positive for frequency (x 4-5 per day). Negative for dysuria, flank pain, hematuria and urgency.  Musculoskeletal: Negative for back pain (occasional), joint pain, myalgias and neck pain.  Skin: Negative for itching and rash.  Neurological: Positive for weakness (generalized). Negative for dizziness, tingling, sensory change and headaches.  Endo/Heme/Allergies: Does not bruise/bleed easily.  Psychiatric/Behavioral: Positive for memory loss (worsening). Negative for depression. The patient is not nervous/anxious and does not have insomnia.   All other systems reviewed and are negative.   Performance status (ECOG): 2  Vitals Blood pressure 111/77, pulse 80, temperature (!) 95.6 F (35.3 C), temperature source Tympanic, weight 128 lb 10.2 oz (58.3 kg), SpO2 98 %.   Physical Exam Vitals and nursing note reviewed.  Constitutional:      General: He is not in acute distress.    Appearance: He is well-developed and well-nourished. He is not diaphoretic.     Comments: Thin gentleman sitting comfortably in a wheelchair. Examined in wheelchair.  HENT:     Head: Normocephalic and  atraumatic.     Mouth/Throat:     Mouth: Oropharynx is clear and moist.     Pharynx: No oropharyngeal exudate.      Comments: Cap.  Gray hair and beard. Mask. Eyes:     General: No scleral icterus.    Extraocular Movements: EOM normal.     Conjunctiva/sclera: Conjunctivae normal.     Pupils: Pupils are equal, round, and reactive to light.     Comments: Glasses. Blue eyes.  Cardiovascular:     Rate and Rhythm: Normal rate and regular rhythm.     Heart sounds: Normal heart sounds. No murmur heard.   Pulmonary:     Effort: Pulmonary effort is normal. No respiratory distress.     Breath sounds: Normal breath sounds. No wheezing or rales.  Chest:     Chest wall: No tenderness.  Abdominal:     General: Bowel sounds are normal. There is no distension.     Palpations: Abdomen is soft. There is no mass.     Tenderness: There is no abdominal tenderness. There is no guarding or rebound.  Musculoskeletal:        General: No tenderness or edema. Normal range of motion.     Cervical back: Normal range of motion and neck supple.  Lymphadenopathy:     Head:     Right side of head: No preauricular, posterior auricular or occipital adenopathy.     Left side of head: No preauricular, posterior auricular or occipital adenopathy.     Cervical: No cervical adenopathy.     Upper Body:  No axillary adenopathy present.    Right upper body: No supraclavicular adenopathy.     Left upper body: No supraclavicular adenopathy.     Lower Body: No right inguinal adenopathy. No left inguinal adenopathy.  Skin:    General: Skin is warm and dry.  Neurological:     Mental Status: He is alert and oriented to person, place, and time.  Psychiatric:        Mood and Affect: Mood and affect normal.        Behavior: Behavior normal.        Thought Content: Thought content normal.        Judgment: Judgment normal.    Infusion on 07/05/2019  Component Date Value Ref Range Status  . WBC 07/05/2019 5.5  4.0 -  10.5 K/uL Final  .  RBC 07/05/2019 4.01* 4.22 - 5.81 MIL/uL Final  . Hemoglobin 07/05/2019 13.3  13.0 - 17.0 g/dL Final  . HCT 07/05/2019 38.4* 39 - 52 % Final  . MCV 07/05/2019 95.8  80.0 - 100.0 fL Final  . MCH 07/05/2019 33.2  26.0 - 34.0 pg Final  . MCHC 07/05/2019 34.6  30.0 - 36.0 g/dL Final  . RDW 07/05/2019 12.6  11.5 - 15.5 % Final  . Platelets 07/05/2019 153  150 - 400 K/uL Final  . nRBC 07/05/2019 0.0  0.0 - 0.2 % Final  . Neutrophils Relative % 07/05/2019 82  % Final  . Neutro Abs 07/05/2019 4.5  1.7 - 7.7 K/uL Final  . Lymphocytes Relative 07/05/2019 14  % Final  . Lymphs Abs 07/05/2019 0.8  0.7 - 4.0 K/uL Final  . Monocytes Relative 07/05/2019 3  % Final  . Monocytes Absolute 07/05/2019 0.2  0 - 1 K/uL Final  . Eosinophils Relative 07/05/2019 0  % Final  . Eosinophils Absolute 07/05/2019 0.0  0 - 0 K/uL Final  . Basophils Relative 07/05/2019 0  % Final  . Basophils Absolute 07/05/2019 0.0  0 - 0 K/uL Final  . Immature Granulocytes 07/05/2019 1  % Final  . Abs Immature Granulocytes 07/05/2019 0.03  0.00 - 0.07 K/uL Final   Performed at [facility], [address], [place], [place] [address]    Assessment:  Andres Hebert is a 86 y.o. male with metastaticadenocarcinoma of thelungs/p right sided thoracentesis on 10/06/2018.Cytologyconfirmed non-small cell carcinoma, favor adenocarcinoma of the lung.He has a 25-30 pack year smoking history.He has clinical stageT4NxM1b.  PD-L1 testing was 80% on 11/09/2018. Foundation Onetesting revealednumber mutational burden (TMB)18 Muts/Mb, MSI-stable, ATM splice site 2831-5V>V, CDKN2A loss, CDKN2B loss, DNMT3A Y533C, FGF10 amplification, KRAS G12C, MTAP loss, and RBM10 A444f*216.  Chest CT angiogramon 10/05/2018 revealed a 8.4 x 6.9 cm right lower lobe mass with numerous pleural nodules and a large right pleural effusion. There was bony destruction of the right lateral 7th and 8th ribs. There was no  pulmonary embolism.  Head MRIon 10/07/2018 revealed no metastatic disease or acute intracranial abnormality.There was distal left vertebral artery with poor flow or occlusion, most likely due to atherosclerosis.  PET scanon 10/18/2018 revealed a large hypermetabolic RIGHT lower lobe mass consistent with bronchogenic carcinoma. There was multifocal pleural metastasis within the RIGHT hemithorax. There was one pleural metastatic lesion invading adjacent RIGHT seventh rib, and moderate RIGHT effusion. There was probable RIGHT hilar metastatic adenopathy, and no mediastinal or supraclavicular metastatic adenopathy.   He is s/pthoracentesisx 2 (10/06/2018 and10/05/2018).CT biopsyof the right lower lobelung masson 10/31/2018 was performed for mutational studies.  He is s/p cycle #1 Alimta and pembrolizumabon 11/03/2018.He iss/p8cycles ofpembrolizumabalone (11/24/2018-04/27/2019).  Chest CTon 02/12/2019 revealed interval improvement in the patient's malignancy. The primary mass was3.9 x 3.6 cm (previously 5.7 x 7.5 cm). The right pleural nodularityhadimproved insize andnumberof lesions(nodularity abutting right 5th rib was1.5 x 0.5 cm; previously 2.9 x 1.4 cm). There were sclerotic changes in multiple right-sided ribsc/wtreated metastatic disease. There was no right hilar adenopathy. The right pleural effusion was smaller. There was severe emphysematous changes in the lungs. There was no metastatic disease below the diaphragm.   Bone scanon 02/14/2019 revealed a linear bandlike uptake along the inferior aspect of the L3 vertebral body. This would not be typical for metastatic disease and may be related to compression fracture. Lumbar spine MRI could be used to further evaluate. There was radiotracer accumulation in the lateral  right fifth and seventh ribsc/wknown metastatic disease as characterized on previous chest CT and PET-CT. There was no unexpected uptake in  the bony pelvis.  Lumbar spine MRIon 02/15/2019 revealed amild inferior endplate compression fracture of L3with vertebral body height loss of up to approximately 30% has an appearance most c/wa senile osteoporotic injury.There was no evidencefor metastatic disease. There was partial visualization of a right pleural effusion. The patient had a right effusion on the prior chest CT.There was an approximately 3.6 cm abdominal aortic aneurysm(chronic).He underwent L3 kyphoplastyon 02/20/2019.  Chest CTon 05/15/2019 revealed slight interval decrease in size and fullness of a spiculated infrahilar mass of the right lower lobe, with extensive postobstructive consolidation of the right lung base and a small right pleural effusion. There was interval increase in size in multiple subpleural soft tissue nodules about the right lung c/w worsened pleural metastatic disease. There were unchanged sclerotic rib lesions.   He has a history of renal insufficiencyon 05/28/2019. Renal ultrasoundon 05/28/2019 showed bilateral simple renal cystswithout hydronephrosis.  SPEP on 07/02/2019 revealed a poorly defined band of restricted protein mobility in the gamma globulins.  He received the influenza vaccineon 10/13/2018. He received his second COVID-19 vaccineon 04/19/2019.  Symptomatically, he spends much of his day resting.  He denies any increased shortness of breath.  He is trying to drink more fluids.  Exam is stable.  Plan: 1.   Labs today: CBC with diff, CMP. 2. Stage IV adenocarcinoma of the lung Clinically, he is doing fair Patient iss/p 1 cycle of Alimta and pembrolizumab. Patient is s/p8cycles ofpembrolizumab alone(last04/02/2019). Chest CT on 04/02/2021revealeda mixed response with slight decrease in the right infrahilar mass and increase in subpleural nodules in the right lung. Pembrolizumabwas discontinued  (last dose on 04/27/2019). Discuss need for creatinine clearance> 45 to proceed with Alimta.  Discuss extra hydration with oral as well as IV fluids over the next few days and retest creatinine.  If unable to proceed with Alimta discuss plans for gemcitabine.  Patient and his wife are in agreement. 3. Renal insufficiency Creatinine 1.24 today. Baseline creatinine 0.82 - 1.01. Renal ultrasound ruled out obstruction. Review interval note by Dr. Holley Raring.  Discuss importance of avoiding dehydration.  Continue to push fluids and receive IV fluids this week. 4.Bone metastasis Patienthasright lower ribmetastasis seen on chest CT. Bone scan on 02/14/2019 revealed only lateral right 5th and 7th ribs c/w metastatic disease. Delton See remains on hold secondary to renal issues. 5.Cancer-related pain Pain remains well controlled 6. Hypokalemia Potassium3.9 today. Continue to monitor 7. Weight loss Weight is down 5 pounds. Discuss importance of caloric intake as well as fluid.  Nutrition consult.  Ensure samples today.  8.   Encourage fluids at home. 9.   IVF today and Friday. 10.   B12 on 07/20/2019. 11.   RTC on 07/10/2019 (Tuesday) for MD assessment, labs (CBC with diff, CMP), and +/- Alimta  I discussed the assessment and treatment plan with the patient.  The patient was provided an opportunity to ask questions and all were answered.  The patient agreed with the plan and demonstrated an understanding of the instructions.  The patient was advised to call back if the symptoms worsen or if the condition fails to improve as anticipated.  I provided 16 minutes of face-to-face time during this this encounter and > 50% was spent counseling as documented under my assessment and plan. An additional 10 minutes were spent  reviewing his chart (Epic and Care Everywhere) including notes, labs, and imaging  studies.   Lequita Asal, MD, PhD    07/05/2019, 9:44 AM  I, Selena Batten, am acting as scribe for [employer]. Mike Gip, MD, PhD.  I, Jabrea Kallstrom C. Mike Gip, MD, have reviewed the above documentation for accuracy and completeness, and I agree with the above.

## 2019-07-04 NOTE — Progress Notes (Signed)
Patient wife stated he has coughed up blood every once in a while.

## 2019-07-05 ENCOUNTER — Other Ambulatory Visit: Payer: Self-pay

## 2019-07-05 ENCOUNTER — Inpatient Hospital Stay: Payer: Medicare HMO

## 2019-07-05 ENCOUNTER — Inpatient Hospital Stay (HOSPITAL_BASED_OUTPATIENT_CLINIC_OR_DEPARTMENT_OTHER): Payer: Medicare HMO | Admitting: Hematology and Oncology

## 2019-07-05 ENCOUNTER — Encounter: Payer: Self-pay | Admitting: Hematology and Oncology

## 2019-07-05 ENCOUNTER — Inpatient Hospital Stay: Payer: Medicare HMO | Attending: Hematology and Oncology

## 2019-07-05 VITALS — BP 111/77 | HR 80 | Temp 95.6°F | Wt 128.6 lb

## 2019-07-05 DIAGNOSIS — Z8042 Family history of malignant neoplasm of prostate: Secondary | ICD-10-CM | POA: Diagnosis not present

## 2019-07-05 DIAGNOSIS — Z79899 Other long term (current) drug therapy: Secondary | ICD-10-CM | POA: Diagnosis not present

## 2019-07-05 DIAGNOSIS — E860 Dehydration: Secondary | ICD-10-CM | POA: Insufficient documentation

## 2019-07-05 DIAGNOSIS — C3431 Malignant neoplasm of lower lobe, right bronchus or lung: Secondary | ICD-10-CM

## 2019-07-05 DIAGNOSIS — E119 Type 2 diabetes mellitus without complications: Secondary | ICD-10-CM | POA: Diagnosis not present

## 2019-07-05 DIAGNOSIS — Z87891 Personal history of nicotine dependence: Secondary | ICD-10-CM | POA: Insufficient documentation

## 2019-07-05 DIAGNOSIS — Z823 Family history of stroke: Secondary | ICD-10-CM | POA: Insufficient documentation

## 2019-07-05 DIAGNOSIS — C7951 Secondary malignant neoplasm of bone: Secondary | ICD-10-CM

## 2019-07-05 DIAGNOSIS — E785 Hyperlipidemia, unspecified: Secondary | ICD-10-CM | POA: Insufficient documentation

## 2019-07-05 DIAGNOSIS — R778 Other specified abnormalities of plasma proteins: Secondary | ICD-10-CM

## 2019-07-05 DIAGNOSIS — I10 Essential (primary) hypertension: Secondary | ICD-10-CM | POA: Diagnosis not present

## 2019-07-05 DIAGNOSIS — E876 Hypokalemia: Secondary | ICD-10-CM | POA: Diagnosis not present

## 2019-07-05 DIAGNOSIS — N289 Disorder of kidney and ureter, unspecified: Secondary | ICD-10-CM

## 2019-07-05 DIAGNOSIS — G893 Neoplasm related pain (acute) (chronic): Secondary | ICD-10-CM | POA: Insufficient documentation

## 2019-07-05 DIAGNOSIS — R634 Abnormal weight loss: Secondary | ICD-10-CM | POA: Insufficient documentation

## 2019-07-05 DIAGNOSIS — I714 Abdominal aortic aneurysm, without rupture: Secondary | ICD-10-CM | POA: Insufficient documentation

## 2019-07-05 DIAGNOSIS — N281 Cyst of kidney, acquired: Secondary | ICD-10-CM | POA: Insufficient documentation

## 2019-07-05 DIAGNOSIS — Z820 Family history of epilepsy and other diseases of the nervous system: Secondary | ICD-10-CM | POA: Diagnosis not present

## 2019-07-05 DIAGNOSIS — C782 Secondary malignant neoplasm of pleura: Secondary | ICD-10-CM | POA: Insufficient documentation

## 2019-07-05 DIAGNOSIS — R351 Nocturia: Secondary | ICD-10-CM | POA: Insufficient documentation

## 2019-07-05 DIAGNOSIS — I7389 Other specified peripheral vascular diseases: Secondary | ICD-10-CM | POA: Insufficient documentation

## 2019-07-05 DIAGNOSIS — R413 Other amnesia: Secondary | ICD-10-CM | POA: Diagnosis not present

## 2019-07-05 DIAGNOSIS — Z5111 Encounter for antineoplastic chemotherapy: Secondary | ICD-10-CM | POA: Diagnosis present

## 2019-07-05 LAB — CBC WITH DIFFERENTIAL/PLATELET
Abs Immature Granulocytes: 0.03 10*3/uL (ref 0.00–0.07)
Basophils Absolute: 0 10*3/uL (ref 0.0–0.1)
Basophils Relative: 0 %
Eosinophils Absolute: 0 10*3/uL (ref 0.0–0.5)
Eosinophils Relative: 0 %
HCT: 38.4 % — ABNORMAL LOW (ref 39.0–52.0)
Hemoglobin: 13.3 g/dL (ref 13.0–17.0)
Immature Granulocytes: 1 %
Lymphocytes Relative: 14 %
Lymphs Abs: 0.8 10*3/uL (ref 0.7–4.0)
MCH: 33.2 pg (ref 26.0–34.0)
MCHC: 34.6 g/dL (ref 30.0–36.0)
MCV: 95.8 fL (ref 80.0–100.0)
Monocytes Absolute: 0.2 10*3/uL (ref 0.1–1.0)
Monocytes Relative: 3 %
Neutro Abs: 4.5 10*3/uL (ref 1.7–7.7)
Neutrophils Relative %: 82 %
Platelets: 153 10*3/uL (ref 150–400)
RBC: 4.01 MIL/uL — ABNORMAL LOW (ref 4.22–5.81)
RDW: 12.6 % (ref 11.5–15.5)
WBC: 5.5 10*3/uL (ref 4.0–10.5)
nRBC: 0 % (ref 0.0–0.2)

## 2019-07-05 LAB — COMPREHENSIVE METABOLIC PANEL
ALT: 13 U/L (ref 0–44)
AST: 18 U/L (ref 15–41)
Albumin: 3.6 g/dL (ref 3.5–5.0)
Alkaline Phosphatase: 57 U/L (ref 38–126)
Anion gap: 12 (ref 5–15)
BUN: 26 mg/dL — ABNORMAL HIGH (ref 8–23)
CO2: 26 mmol/L (ref 22–32)
Calcium: 9 mg/dL (ref 8.9–10.3)
Chloride: 96 mmol/L — ABNORMAL LOW (ref 98–111)
Creatinine, Ser: 1.24 mg/dL (ref 0.61–1.24)
GFR calc Af Amer: >60 mL/min (ref >60)
GFR calc non Af Amer: 52 mL/min — ABNORMAL LOW (ref >60)
Glucose, Bld: 273 mg/dL — ABNORMAL HIGH (ref 70–99)
Potassium: 3.9 mmol/L (ref 3.5–5.1)
Sodium: 134 mmol/L — ABNORMAL LOW (ref 135–145)
Total Bilirubin: 0.5 mg/dL (ref 0.3–1.2)
Total Protein: 6.6 g/dL (ref 6.5–8.1)

## 2019-07-05 MED ORDER — HEPARIN SOD (PORK) LOCK FLUSH 100 UNIT/ML IV SOLN
500.0000 [IU] | Freq: Once | INTRAVENOUS | Status: AC | PRN
  Administered 2019-07-05: 500 [IU]
  Filled 2019-07-05: qty 5

## 2019-07-05 MED ORDER — HEPARIN SOD (PORK) LOCK FLUSH 100 UNIT/ML IV SOLN
INTRAVENOUS | Status: AC
  Filled 2019-07-05: qty 5

## 2019-07-05 MED ORDER — SODIUM CHLORIDE 0.9 % IV SOLN
Freq: Once | INTRAVENOUS | Status: AC
  Filled 2019-07-05: qty 250

## 2019-07-05 NOTE — Patient Instructions (Signed)
Gemcitabine injection What is this medicine? GEMCITABINE (jem SYE ta been) is a chemotherapy drug. This medicine is used to treat many types of cancer like breast cancer, lung cancer, pancreatic cancer, and ovarian cancer. This medicine may be used for other purposes; ask your health care provider or pharmacist if you have questions. COMMON BRAND NAME(S): Gemzar, Infugem What should I tell my health care provider before I take this medicine? They need to know if you have any of these conditions:  blood disorders  infection  kidney disease  liver disease  lung or breathing disease, like asthma  recent or ongoing radiation therapy  an unusual or allergic reaction to gemcitabine, other chemotherapy, other medicines, foods, dyes, or preservatives  pregnant or trying to get pregnant  breast-feeding How should I use this medicine? This drug is given as an infusion into a vein. It is administered in a hospital or clinic by a specially trained health care professional. Talk to your pediatrician regarding the use of this medicine in children. Special care may be needed. Overdosage: If you think you have taken too much of this medicine contact a poison control center or emergency room at once. NOTE: This medicine is only for you. Do not share this medicine with others. What if I miss a dose? It is important not to miss your dose. Call your doctor or health care professional if you are unable to keep an appointment. What may interact with this medicine?  medicines to increase blood counts like filgrastim, pegfilgrastim, sargramostim  some other chemotherapy drugs like cisplatin  vaccines Talk to your doctor or health care professional before taking any of these medicines:  acetaminophen  aspirin  ibuprofen  ketoprofen  naproxen This list may not describe all possible interactions. Give your health care provider a list of all the medicines, herbs, non-prescription drugs, or  dietary supplements you use. Also tell them if you smoke, drink alcohol, or use illegal drugs. Some items may interact with your medicine. What should I watch for while using this medicine? Visit your doctor for checks on your progress. This drug may make you feel generally unwell. This is not uncommon, as chemotherapy can affect healthy cells as well as cancer cells. Report any side effects. Continue your course of treatment even though you feel ill unless your doctor tells you to stop. In some cases, you may be given additional medicines to help with side effects. Follow all directions for their use. Call your doctor or health care professional for advice if you get a fever, chills or sore throat, or other symptoms of a cold or flu. Do not treat yourself. This drug decreases your body's ability to fight infections. Try to avoid being around people who are sick. This medicine may increase your risk to bruise or bleed. Call your doctor or health care professional if you notice any unusual bleeding. Be careful brushing and flossing your teeth or using a toothpick because you may get an infection or bleed more easily. If you have any dental work done, tell your dentist you are receiving this medicine. Avoid taking products that contain aspirin, acetaminophen, ibuprofen, naproxen, or ketoprofen unless instructed by your doctor. These medicines may hide a fever. Do not become pregnant while taking this medicine or for 6 months after stopping it. Women should inform their doctor if they wish to become pregnant or think they might be pregnant. Men should not father a child while taking this medicine and for 3 months after stopping it.  There is a potential for serious side effects to an unborn child. Talk to your health care professional or pharmacist for more information. Do not breast-feed an infant while taking this medicine or for at least 1 week after stopping it. Men should inform their doctors if they wish  to father a child. This medicine may lower sperm counts. Talk with your doctor or health care professional if you are concerned about your fertility. What side effects may I notice from receiving this medicine? Side effects that you should report to your doctor or health care professional as soon as possible:  allergic reactions like skin rash, itching or hives, swelling of the face, lips, or tongue  breathing problems  pain, redness, or irritation at site where injected  signs and symptoms of a dangerous change in heartbeat or heart rhythm like chest pain; dizziness; fast or irregular heartbeat; palpitations; feeling faint or lightheaded, falls; breathing problems  signs of decreased platelets or bleeding - bruising, pinpoint red spots on the skin, black, tarry stools, blood in the urine  signs of decreased red blood cells - unusually weak or tired, feeling faint or lightheaded, falls  signs of infection - fever or chills, cough, sore throat, pain or difficulty passing urine  signs and symptoms of kidney injury like trouble passing urine or change in the amount of urine  signs and symptoms of liver injury like dark yellow or brown urine; general ill feeling or flu-like symptoms; light-colored stools; loss of appetite; nausea; right upper belly pain; unusually weak or tired; yellowing of the eyes or skin  swelling of ankles, feet, hands Side effects that usually do not require medical attention (report to your doctor or health care professional if they continue or are bothersome):  constipation  diarrhea  hair loss  loss of appetite  nausea  rash  vomiting This list may not describe all possible side effects. Call your doctor for medical advice about side effects. You may report side effects to FDA at 1-800-FDA-1088. Where should I keep my medicine? This drug is given in a hospital or clinic and will not be stored at home. NOTE: This sheet is a summary. It may not cover all  possible information. If you have questions about this medicine, talk to your doctor, pharmacist, or health care provider.  2020 Elsevier/Gold Standard (2017-04-06 18:06:11)

## 2019-07-06 ENCOUNTER — Inpatient Hospital Stay: Payer: Medicare HMO | Attending: Hematology and Oncology

## 2019-07-06 VITALS — BP 122/69 | HR 60 | Temp 97.0°F | Resp 19

## 2019-07-06 DIAGNOSIS — C7951 Secondary malignant neoplasm of bone: Secondary | ICD-10-CM | POA: Insufficient documentation

## 2019-07-06 DIAGNOSIS — N289 Disorder of kidney and ureter, unspecified: Secondary | ICD-10-CM | POA: Diagnosis not present

## 2019-07-06 DIAGNOSIS — Z823 Family history of stroke: Secondary | ICD-10-CM | POA: Diagnosis not present

## 2019-07-06 DIAGNOSIS — M549 Dorsalgia, unspecified: Secondary | ICD-10-CM | POA: Insufficient documentation

## 2019-07-06 DIAGNOSIS — C3432 Malignant neoplasm of lower lobe, left bronchus or lung: Secondary | ICD-10-CM | POA: Insufficient documentation

## 2019-07-06 DIAGNOSIS — E876 Hypokalemia: Secondary | ICD-10-CM | POA: Diagnosis not present

## 2019-07-06 DIAGNOSIS — E119 Type 2 diabetes mellitus without complications: Secondary | ICD-10-CM | POA: Insufficient documentation

## 2019-07-06 DIAGNOSIS — E860 Dehydration: Secondary | ICD-10-CM

## 2019-07-06 DIAGNOSIS — Z8042 Family history of malignant neoplasm of prostate: Secondary | ICD-10-CM | POA: Diagnosis not present

## 2019-07-06 DIAGNOSIS — Z820 Family history of epilepsy and other diseases of the nervous system: Secondary | ICD-10-CM | POA: Diagnosis not present

## 2019-07-06 DIAGNOSIS — G893 Neoplasm related pain (acute) (chronic): Secondary | ICD-10-CM | POA: Diagnosis not present

## 2019-07-06 DIAGNOSIS — Z79899 Other long term (current) drug therapy: Secondary | ICD-10-CM | POA: Diagnosis not present

## 2019-07-06 DIAGNOSIS — E785 Hyperlipidemia, unspecified: Secondary | ICD-10-CM | POA: Diagnosis not present

## 2019-07-06 LAB — KAPPA/LAMBDA LIGHT CHAINS
Kappa free light chain: 30.8 mg/L — ABNORMAL HIGH (ref 3.3–19.4)
Kappa, lambda light chain ratio: 1.8 — ABNORMAL HIGH (ref 0.26–1.65)
Lambda free light chains: 17.1 mg/L (ref 5.7–26.3)

## 2019-07-06 MED ORDER — SODIUM CHLORIDE 0.9% FLUSH
10.0000 mL | Freq: Once | INTRAVENOUS | Status: AC
  Administered 2019-07-06: 10 mL via INTRAVENOUS
  Filled 2019-07-06: qty 10

## 2019-07-06 MED ORDER — SODIUM CHLORIDE 0.9 % IV SOLN
INTRAVENOUS | Status: DC
  Filled 2019-07-06 (×2): qty 250

## 2019-07-06 MED ORDER — HEPARIN SOD (PORK) LOCK FLUSH 100 UNIT/ML IV SOLN
INTRAVENOUS | Status: AC
  Filled 2019-07-06: qty 5

## 2019-07-06 MED ORDER — HEPARIN SOD (PORK) LOCK FLUSH 100 UNIT/ML IV SOLN
500.0000 [IU] | Freq: Once | INTRAVENOUS | Status: AC
  Administered 2019-07-06: 500 [IU] via INTRAVENOUS
  Filled 2019-07-06: qty 5

## 2019-07-06 NOTE — Progress Notes (Signed)
Fluid orders reviewed with Sonia Baller, NP. New orders placed by NP. Pt and family member updated and all questions answered at this time.   Sherrelle Prochazka [employer]

## 2019-07-08 ENCOUNTER — Ambulatory Visit
Admission: RE | Admit: 2019-07-08 | Discharge: 2019-07-08 | Disposition: A | Discharge status: Home / Self Care | Payer: Medicare HMO | Source: Ambulatory Visit | Attending: Acute Care | Admitting: Acute Care

## 2019-07-08 DIAGNOSIS — Z859 Personal history of malignant neoplasm, unspecified: Secondary | ICD-10-CM

## 2019-07-08 DIAGNOSIS — R413 Other amnesia: Secondary | ICD-10-CM | POA: Insufficient documentation

## 2019-07-08 MED ORDER — GADOBUTROL 1 MMOL/ML IV SOLN
6.0000 mL | Freq: Once | INTRAVENOUS | Status: AC | PRN
  Administered 2019-07-08: 6 mL via INTRAVENOUS

## 2019-07-09 LAB — MULTIPLE MYELOMA PANEL, SERUM
Albumin SerPl Elph-Mcnc: 3.3 g/dL (ref 2.9–4.4)
Albumin/Glob SerPl: 1.1 (ref 0.7–1.7)
Alpha 1: 0.3 g/dL (ref 0.0–0.4)
Alpha2 Glob SerPl Elph-Mcnc: 1 g/dL (ref 0.4–1.0)
B-Globulin SerPl Elph-Mcnc: 1.1 g/dL (ref 0.7–1.3)
Gamma Glob SerPl Elph-Mcnc: 0.8 g/dL (ref 0.4–1.8)
Globulin, Total: 3.1 g/dL (ref 2.2–3.9)
IgA: 208 mg/dL (ref 61–437)
IgG (Immunoglobin G), Serum: 882 mg/dL (ref 603–1613)
IgM (Immunoglobulin M), Srm: 21 mg/dL (ref 15–143)
Total Protein ELP: 6.4 g/dL (ref 6.0–8.5)

## 2019-07-09 NOTE — Progress Notes (Signed)
[facility]  [address], [address] [place], [place] [address] Phone: [phone]  Fax: [phone]   Clinic Day:  07/10/2019  Referring physician: [name], MD  Chief Complaint: Andres Hebert is a 86 y.o. male with stage IV adenocarcinoma of therightlung who is seen for assessment prior to cycle #1 Alimta after interval  fluids.  HPI: The patient was last seen in the medical oncology clinic on 07/05/2019. At that time, he had a chronic cough.  He was eating enough to feel satisfied; he did not want to gain too much weight.  He was eating smaller meals along with poor fluid intake.  Hematocrit was 38.4, hemoglobin 13.3, platelets 153,000, WBC 5,500.  Sodium was 134 and creatinine 1.24.  M-spike was 0; kappa free light chains were 30.8 (ratio 1.80).    Nephrology felt that he had bouts of volume depletion and dehydration accounting for his renal insufficiency.  Patient and his wife agreed to tracking his fluid intake and speaking with Jennet Maduro, RD.  IVF in clinic were recommended.  Head MRI was planned for memory issues.   He received IVF in clinic on 07/05/2019 and 07/06/2019.  Head MRI on 07/08/2019 revealed no intracranial metastatic disease. There was multifocal white matter hyperintensity, most commonly due to chronic ischemic microangiopathy.  During the interim, he has felt "good". He felt like yesterday was a bad day. Each day he is drinking about 50 oz of fluid. His wife reports that he still not eating well. He had some trouble sleeping last night. He notes frequency every hour at night from drinking fluids throughout the day. I recommended he drink fluid during the day and by 7 PM to limit water and fluid intake.   We discussed that his creatinine clearance has not improved despite pushing fluids and IVF in clinic.  Patient is comfortable with trying gemcitabine and potentially going back to Alimta in the near future if his disease progresses.   He wishes to continue treatment.    Past Medical History:  Diagnosis Date  . BPV (benign positional vertigo)   . Cancer ([place])    lung cancer stage IV  . Chronic constipation   . Collapsed lung 1959   right  . Diabetes mellitus without complication (HCC)    borderline  . History of hiatal hernia   . Hyperlipidemia   . Hypertension     Past Surgical History:  Procedure Laterality Date  . CATARACT EXTRACTION, BILATERAL    . COLONOSCOPY    . ESOPHAGOGASTRODUODENOSCOPY    . EYE SURGERY    . KYPHOPLASTY N/A 02/20/2019   Procedure: L3 KYPHOPLASTY;  Surgeon: Hessie Knows, MD;  Location: [facility] ORS;  Service: Orthopedics;  Laterality: N/A;  . PORTA CATH INSERTION N/A 11/27/2018   Procedure: PORTA CATH INSERTION;  Surgeon: Algernon Huxley, MD;  Location: [facility];  Service: Cardiovascular;  Laterality: N/A;    Family History  Problem Relation Age of Onset  . Stroke Mother   . Epilepsy Mother   . Prostate cancer Father     Social History:  reports that he quit smoking about 45 years ago. His smoking use included cigarettes. He has a 25.00 pack-year smoking history. He has never used smokeless tobacco. He reports previous alcohol use. He reports that he does not use drugs. He stoppedsmoking about 45 years ago.He denies any exposure to asbestos, radiation or toxins.He worked for [employer] and [employer] recently lost his son. He lives in [place].His wife's  name isSheila. The patient is accompanied by [name] on the iPad today.   Allergies:  Allergies  Allergen Reactions  . Accupril [Quinapril Hcl] Other (See Comments)    dizziness per MR    Current Medications: Current Outpatient Medications  Medication Sig Dispense Refill  . amLODipine (NORVASC) 2.5 MG tablet Take 2.5 mg by mouth daily.    Marland Kitchen aspirin EC 81 MG tablet Take 81 mg by mouth daily.    . citalopram (CELEXA) 10 MG tablet Take 1 tablet (10 mg total) by mouth daily. May increase to two tablets (46m total) after one  week if tolerating 45 tablet 0  . dexamethasone (DECADRON) 4 MG tablet Take 1 tab two times a day the day before Alimta chemo. Take 2 tabs the day after chemo, then take 2 tabs two times a day for 2 days. 30 tablet 1  . feeding supplement, ENSURE ENLIVE, (ENSURE ENLIVE) LIQD Take 237 mLs by mouth 3 (three) times daily between meals. (Patient taking differently: Take 237 mLs by mouth 4 (four) times a week. ) 2175mL 0  . folic acid (FOLVITE) 1 MG tablet Take 1 tablet (1 mg total) by mouth daily. Start 5-7 days before Alimta chemotherapy. Continue until 21 days after Alimta completed. 100 tablet 3  . lidocaine-prilocaine (EMLA) cream Apply 1 application topically as needed. 30 g 1  . oxyCODONE (OXY IR/ROXICODONE) 5 MG immediate release tablet Take 1 tablet every 4 hours as needed for pain.  Keep a pain diary. 40 tablet 0  . OXYGEN Inhale into the lungs as needed. (Patient not taking: Reported on 07/10/2019)    . polyethylene glycol (MIRALAX / GLYCOLAX) 17 g packet Take 17 g by mouth 2 (two) times daily. 14 each 0  . potassium chloride (K-DUR,KLOR-CON) 10 MEQ tablet Take 10 mEq by mouth 2 (two) times daily.    . potassium chloride (KLOR-CON) 10 MEQ tablet Take 10 mEq by mouth 2 (two) times daily.    . pravastatin (PRAVACHOL) 40 MG tablet Take 40 mg by mouth daily.    .Marland Kitchensenna (SENOKOT) 8.6 MG TABS tablet Take 1 tablet (8.6 mg total) by mouth daily as needed for mild constipation. 30 tablet 0  . traZODone (DESYREL) 50 MG tablet Take 100 mg by mouth at bedtime.     . triamterene-hydrochlorothiazide (MAXZIDE-25) 37.5-25 MG tablet Take 1 tablet by mouth daily.     No current facility-administered medications for this visit.    Review of Systems  Constitutional: Positive for malaise/fatigue (in bed 12-15 hrs per day). Negative for chills, diaphoresis, fever and weight loss (up 1 pound).       Doing "well".  In bed/sitting majority of the day.   HENT: Negative for congestion, ear discharge, ear pain,  hearing loss, nosebleeds, sinus pain, sore throat and tinnitus.   Eyes: Negative for blurred vision.  Respiratory: Negative for cough, hemoptysis (mild), sputum production (spitting up bubbles) and shortness of breath.   Cardiovascular: Negative for chest pain, palpitations and leg swelling.  Gastrointestinal: Negative for abdominal pain, blood in stool, constipation, diarrhea, heartburn, melena, nausea and vomiting.       Eating small meals. Early satiety. Drinking boost BID. Drinking 50 oz of fluid daily.  Genitourinary: Positive for frequency (every hour). Negative for dysuria, flank pain, hematuria and urgency.  Musculoskeletal: Negative for back pain (occasional), joint pain, myalgias and neck pain.  Skin: Negative for itching and rash.  Neurological: Positive for weakness (generalized). Negative for dizziness, tingling, sensory change and  headaches.  Endo/Heme/Allergies: Does not bruise/bleed easily.  Psychiatric/Behavioral: Positive for memory loss (recent head MRI). Negative for depression. The patient has insomnia (up voiding at night). The patient is not nervous/anxious.   All other systems reviewed and are negative.   Performance status (ECOG): 2  Vitals Blood pressure 108/66, pulse 71, temperature (!) 96.7 F (35.9 C), temperature source Tympanic, resp. rate 16, weight 129 lb 10.1 oz (58.8 kg), SpO2 95 %.   Physical Exam Vitals and nursing note reviewed.  Constitutional:      General: He is not in acute distress.    Appearance: He is well-developed. He is not diaphoretic.     Comments: Thin gentleman sitting comfortably in a wheelchair. Patient is examined in the wheelchair.  HENT:     Head: Normocephalic and atraumatic.     Mouth/Throat:     Mouth: Mucous membranes are moist.     Pharynx: Oropharynx is clear. No oropharyngeal exudate.  Eyes:     General: No scleral icterus.    Conjunctiva/sclera: Conjunctivae normal.     Pupils: Pupils are equal, round, and reactive to  light.     Comments: Glasses. Blue eyes.  Cardiovascular:     Rate and Rhythm: Normal rate and regular rhythm.     Heart sounds: Normal heart sounds. No murmur heard.   Pulmonary:     Effort: Pulmonary effort is normal. No respiratory distress.     Breath sounds: Normal breath sounds. No wheezing or rales.  Chest:     Chest wall: No tenderness.  Abdominal:     General: Bowel sounds are normal. There is no distension.     Palpations: Abdomen is soft. There is no mass.     Tenderness: There is no abdominal tenderness. There is no guarding or rebound.  Musculoskeletal:        General: No tenderness. Normal range of motion.     Cervical back: Normal range of motion and neck supple.  Lymphadenopathy:     Head:     Right side of head: No preauricular, posterior auricular or occipital adenopathy.     Left side of head: No preauricular, posterior auricular or occipital adenopathy.     Cervical: No cervical adenopathy.     Upper Body:     Right upper body: No supraclavicular adenopathy.     Left upper body: No supraclavicular adenopathy.     Lower Body: No right inguinal adenopathy. No left inguinal adenopathy.  Skin:    General: Skin is warm and dry.  Neurological:     Mental Status: He is alert and oriented to person, place, and time.  Psychiatric:        Mood and Affect: Mood normal.        Behavior: Behavior normal.        Thought Content: Thought content normal.        Judgment: Judgment normal.    Appointment on 07/10/2019  Component Date Value Ref Range Status  . Sodium 07/10/2019 134* 135 - 145 mmol/L Final  . Potassium 07/10/2019 3.6  3.5 - 5.1 mmol/L Final  . Chloride 07/10/2019 95* 98 - 111 mmol/L Final  . CO2 07/10/2019 30  22 - 32 mmol/L Final  . Glucose, Bld 07/10/2019 153* 70 - 99 mg/dL Final   Glucose reference range applies only to samples taken after fasting for at least 8 hours.  . BUN 07/10/2019 28* 8 - 23 mg/dL Final  . Creatinine, Ser 07/10/2019 1.14  0.61  -  1.24 mg/dL Final  . Calcium 07/10/2019 9.1  8.9 - 10.3 mg/dL Final  . Total Protein 07/10/2019 6.9  6.5 - 8.1 g/dL Final  . Albumin 07/10/2019 3.8  3.5 - 5.0 g/dL Final  . AST 07/10/2019 18  15 - 41 U/L Final  . ALT 07/10/2019 18  0 - 44 U/L Final  . Alkaline Phosphatase 07/10/2019 51  38 - 126 U/L Final  . Total Bilirubin 07/10/2019 0.6  0.3 - 1.2 mg/dL Final  . GFR calc non Af Amer 07/10/2019 58* >60 mL/min Final  . GFR calc Af Amer 07/10/2019 >60  >60 mL/min Final  . Anion gap 07/10/2019 9  5 - 15 Final   Performed at [facility], [address], [place], [place] [address]  . WBC 07/10/2019 6.2  4.0 - 10.5 K/uL Final  . RBC 07/10/2019 4.04* 4.22 - 5.81 MIL/uL Final  . Hemoglobin 07/10/2019 13.0  13.0 - 17.0 g/dL Final  . HCT 07/10/2019 37.9* 39 - 52 % Final  . MCV 07/10/2019 93.8  80.0 - 100.0 fL Final  . MCH 07/10/2019 32.2  26.0 - 34.0 pg Final  . MCHC 07/10/2019 34.3  30.0 - 36.0 g/dL Final  . RDW 07/10/2019 12.4  11.5 - 15.5 % Final  . Platelets 07/10/2019 156  150 - 400 K/uL Final  . nRBC 07/10/2019 0.0  0.0 - 0.2 % Final  . Neutrophils Relative % 07/10/2019 76  % Final  . Neutro Abs 07/10/2019 4.7  1.7 - 7.7 K/uL Final  . Lymphocytes Relative 07/10/2019 18  % Final  . Lymphs Abs 07/10/2019 1.1  0.7 - 4.0 K/uL Final  . Monocytes Relative 07/10/2019 6  % Final  . Monocytes Absolute 07/10/2019 0.4  0 - 1 K/uL Final  . Eosinophils Relative 07/10/2019 0  % Final  . Eosinophils Absolute 07/10/2019 0.0  0 - 0 K/uL Final  . Basophils Relative 07/10/2019 0  % Final  . Basophils Absolute 07/10/2019 0.0  0 - 0 K/uL Final  . Immature Granulocytes 07/10/2019 0  % Final  . Abs Immature Granulocytes 07/10/2019 0.02  0.00 - 0.07 K/uL Final   Performed at [facility], [address] [place], [place] [address]    Assessment:  JHONATAN LOMELI is a 86 y.o. male with metastaticadenocarcinoma of thelungs/p right sided thoracentesis on  10/06/2018.Cytologyconfirmed non-small cell carcinoma, favor adenocarcinoma of the lung.He has a 25-30 pack year smoking history.He has clinical stageT4NxM1b.  PD-L1 testing was 80% on 11/09/2018. Foundation Onetesting revealednumber mutational burden (TMB)18 Muts/Mb, MSI-stable, ATM splice site 5277-8E>U, CDKN2A loss, CDKN2B loss, DNMT3A Y533C, FGF10 amplification, KRAS G12C, MTAP loss, and RBM10 A426f*216.  Chest CT angiogramon 10/05/2018 revealed a 8.4 x 6.9 cm right lower lobe mass with numerous pleural nodules and a large right pleural effusion. There was bony destruction of the right lateral 7th and 8th ribs. There was no pulmonary embolism.  Head MRIon 10/07/2018 revealed no metastatic disease or acute intracranial abnormality.There was distal left vertebral artery with poor flow or occlusion, most likely due to atherosclerosis.  PET scanon 10/18/2018 revealed a large hypermetabolic RIGHT lower lobe mass consistent with bronchogenic carcinoma. There was multifocal pleural metastasis within the RIGHT hemithorax. There was one pleural metastatic lesion invading adjacent RIGHT seventh rib, and moderate RIGHT effusion. There was probable RIGHT hilar metastatic adenopathy, and no mediastinal or supraclavicular metastatic adenopathy.   He is s/pthoracentesisx 2 (10/06/2018 and10/05/2018).CT biopsyof the right lower lobelung masson 10/31/2018 was performed for  mutational studies.  He is s/p cycle #1 Alimta and pembrolizumabon 11/03/2018.He iss/p8cycles ofpembrolizumabalone (11/24/2018-04/27/2019).  Chest CTon 02/12/2019 revealed interval improvement in the patient's malignancy. The primary mass was3.9 x 3.6 cm (previously 5.7 x 7.5 cm). The right pleural nodularityhadimproved insize andnumberof lesions(nodularity abutting right 5th rib was1.5 x 0.5 cm; previously 2.9 x 1.4 cm). There were sclerotic changes in multiple right-sided ribsc/wtreated  metastatic disease. There was no right hilar adenopathy. The right pleural effusion was smaller. There was severe emphysematous changes in the lungs. There was no metastatic disease below the diaphragm.   Bone scanon 02/14/2019 revealed a linear bandlike uptake along the inferior aspect of the L3 vertebral body. This would not be typical for metastatic disease and may be related to compression fracture. Lumbar spine MRI could be used to further evaluate. There was radiotracer accumulation in the lateral right fifth and seventh ribsc/wknown metastatic disease as characterized on previous chest CT and PET-CT. There was no unexpected uptake in the bony pelvis.  Lumbar spine MRIon 02/15/2019 revealed amild inferior endplate compression fracture of L3with vertebral body height loss of up to approximately 30% has an appearance most c/wa senile osteoporotic injury.There was no evidencefor metastatic disease. There was partial visualization of a right pleural effusion. The patient had a right effusion on the prior chest CT.There was an approximately 3.6 cm abdominal aortic aneurysm(chronic).He underwent L3 kyphoplastyon 02/20/2019.  Chest CTon 05/15/2019 revealed slight interval decrease in size and fullness of a spiculated infrahilar mass of the right lower lobe, with extensive postobstructive consolidation of the right lung base and a small right pleural effusion. There was interval increase in size in multiple subpleural soft tissue nodules about the right lung c/w worsened pleural metastatic disease. There were unchanged sclerotic rib lesions.   He has a history of renal insufficiencyon 05/28/2019. Renal ultrasoundon 05/28/2019 showed bilateral simple renal cystswithout hydronephrosis.  SPEP on 07/02/2019 revealed a poorly defined band of restricted protein mobility in the gamma globulins.  He has memory loss issues.  Brain MRI on 07/08/2019 revealed no intracranial metastatic  disease. There was multifocal white matter hyperintensity, most commonly due to chronic ischemic microangiopathy.  He received the influenza vaccineon 10/13/2018. He received his second COVID-19 vaccineon 04/19/2019.  Symptomatically, he denies any concerns today.  He is eating small meals and drinking fluids.  Despite drinking 50 oz/day and IVF x 2 days, CrCl is 38.7 ml/min.  Plan: 1.   Labs today: CBC with diff, CMP. 2. Stage IV adenocarcinoma of the lung Clinically, heis doing fairly well. Patient iss/p 1 cycle of Alimta and pembrolizumab. Patient is s/p8cycles ofpembrolizumab alone(last04/02/2019). Chest CT on 04/02/2021revealeda mixed response with slight decrease in the right infrahilar mass and increase in subpleural nodules in the right lung. Pembrolizumabwas discontinued (last dose on 04/27/2019). Discuss difficulty starting Alimta as CrCl < 45 ml/min despite recent hydration Discuss patient's thoughts about therapy.  He would like to try a different chemotherapy.   Discuss gemcitabine weekly (2 weeks on and 1 week off).   Potential side effects reviewed.   Information provided.  Begin gemcitabine next week.  Address future consideration of Alimta based on renal function. 3. Renal insufficiency Creatinine 1.24 to 1.14 (CrCl 38.7 ml/min) today. Baseline creatinine 0.82 - 1.01. No hydronephrosis on ultrasound.  Nephrology consultation notes bouts of volume depletion and dehydration.  Discuss fluid intake and avoiding fluids after 7 PM at night secondary to nocturia.  Continue to monitor. 4.Bone metastasis Patienthasright lower ribmetastasis seen on chest CT. Bone scan  on 02/14/2019 revealed only lateral right 5th and 7th ribs c/w metastatic disease. Consider Xgeva in the future based on renal  function. 5.Cancer-related pain Pain is well controlled. 6. Hypokalemia Potassium3.6today. No intervention needed. 7. Weight loss Weight is up 1 pound.  Continue to encourage good hydration and small frequent meals. 8.No treatment today. 9.   RTC in 1 week for MD assessment, labs (CBC with diff, CMP), and treatment (gemcitabine).  I discussed the assessment and treatment plan with the patient.  The patient was provided an opportunity to ask questions and all were answered.  The patient agreed with the plan and demonstrated an understanding of the instructions.  The patient was advised to call back if the symptoms worsen or if the condition fails to improve as anticipated.  I provided 15 minutes of face-to-face time during this this encounter and > 50% was spent counseling as documented under my assessment and plan. An additional 10 minutes were spent reviewing his chart (Epic and Care Everywhere) including notes, labs, and imaging studies and writing chemotherapy.    Lequita Asal, MD, PhD    07/10/2019, 12:04 PM  I, Selena Batten, am acting as scribe for [employer]. Mike Gip, MD, PhD.

## 2019-07-10 ENCOUNTER — Inpatient Hospital Stay: Payer: Medicare HMO

## 2019-07-10 ENCOUNTER — Inpatient Hospital Stay (HOSPITAL_BASED_OUTPATIENT_CLINIC_OR_DEPARTMENT_OTHER): Payer: Medicare HMO | Admitting: Hematology and Oncology

## 2019-07-10 ENCOUNTER — Other Ambulatory Visit: Payer: Self-pay

## 2019-07-10 ENCOUNTER — Encounter: Payer: Self-pay | Admitting: Hematology and Oncology

## 2019-07-10 VITALS — BP 108/66 | HR 71 | Temp 96.7°F | Resp 16 | Wt 129.6 lb

## 2019-07-10 DIAGNOSIS — C3431 Malignant neoplasm of lower lobe, right bronchus or lung: Secondary | ICD-10-CM

## 2019-07-10 DIAGNOSIS — N289 Disorder of kidney and ureter, unspecified: Secondary | ICD-10-CM | POA: Diagnosis not present

## 2019-07-10 DIAGNOSIS — C7951 Secondary malignant neoplasm of bone: Secondary | ICD-10-CM | POA: Diagnosis not present

## 2019-07-10 DIAGNOSIS — G893 Neoplasm related pain (acute) (chronic): Secondary | ICD-10-CM

## 2019-07-10 DIAGNOSIS — Z5111 Encounter for antineoplastic chemotherapy: Secondary | ICD-10-CM | POA: Diagnosis not present

## 2019-07-10 LAB — CBC WITH DIFFERENTIAL/PLATELET
Abs Immature Granulocytes: 0.02 10*3/uL (ref 0.00–0.07)
Basophils Absolute: 0 10*3/uL (ref 0.0–0.1)
Basophils Relative: 0 %
Eosinophils Absolute: 0 10*3/uL (ref 0.0–0.5)
Eosinophils Relative: 0 %
HCT: 37.9 % — ABNORMAL LOW (ref 39.0–52.0)
Hemoglobin: 13 g/dL (ref 13.0–17.0)
Immature Granulocytes: 0 %
Lymphocytes Relative: 18 %
Lymphs Abs: 1.1 10*3/uL (ref 0.7–4.0)
MCH: 32.2 pg (ref 26.0–34.0)
MCHC: 34.3 g/dL (ref 30.0–36.0)
MCV: 93.8 fL (ref 80.0–100.0)
Monocytes Absolute: 0.4 10*3/uL (ref 0.1–1.0)
Monocytes Relative: 6 %
Neutro Abs: 4.7 10*3/uL (ref 1.7–7.7)
Neutrophils Relative %: 76 %
Platelets: 156 10*3/uL (ref 150–400)
RBC: 4.04 MIL/uL — ABNORMAL LOW (ref 4.22–5.81)
RDW: 12.4 % (ref 11.5–15.5)
WBC: 6.2 10*3/uL (ref 4.0–10.5)
nRBC: 0 % (ref 0.0–0.2)

## 2019-07-10 LAB — COMPREHENSIVE METABOLIC PANEL
ALT: 18 U/L (ref 0–44)
AST: 18 U/L (ref 15–41)
Albumin: 3.8 g/dL (ref 3.5–5.0)
Alkaline Phosphatase: 51 U/L (ref 38–126)
Anion gap: 9 (ref 5–15)
BUN: 28 mg/dL — ABNORMAL HIGH (ref 8–23)
CO2: 30 mmol/L (ref 22–32)
Calcium: 9.1 mg/dL (ref 8.9–10.3)
Chloride: 95 mmol/L — ABNORMAL LOW (ref 98–111)
Creatinine, Ser: 1.14 mg/dL (ref 0.61–1.24)
GFR calc Af Amer: >60 mL/min (ref >60)
GFR calc non Af Amer: 58 mL/min — ABNORMAL LOW (ref >60)
Glucose, Bld: 153 mg/dL — ABNORMAL HIGH (ref 70–99)
Potassium: 3.6 mmol/L (ref 3.5–5.1)
Sodium: 134 mmol/L — ABNORMAL LOW (ref 135–145)
Total Bilirubin: 0.6 mg/dL (ref 0.3–1.2)
Total Protein: 6.9 g/dL (ref 6.5–8.1)

## 2019-07-10 NOTE — Patient Instructions (Signed)
Gemcitabine injection What is this medicine? GEMCITABINE (jem SYE ta been) is a chemotherapy drug. This medicine is used to treat many types of cancer like breast cancer, lung cancer, pancreatic cancer, and ovarian cancer. This medicine may be used for other purposes; ask your health care provider or pharmacist if you have questions. COMMON BRAND NAME(S): Gemzar, Infugem What should I tell my health care provider before I take this medicine? They need to know if you have any of these conditions:  blood disorders  infection  kidney disease  liver disease  lung or breathing disease, like asthma  recent or ongoing radiation therapy  an unusual or allergic reaction to gemcitabine, other chemotherapy, other medicines, foods, dyes, or preservatives  pregnant or trying to get pregnant  breast-feeding How should I use this medicine? This drug is given as an infusion into a vein. It is administered in a hospital or clinic by a specially trained health care professional. Talk to your pediatrician regarding the use of this medicine in children. Special care may be needed. Overdosage: If you think you have taken too much of this medicine contact a poison control center or emergency room at once. NOTE: This medicine is only for you. Do not share this medicine with others. What if I miss a dose? It is important not to miss your dose. Call your doctor or health care professional if you are unable to keep an appointment. What may interact with this medicine?  medicines to increase blood counts like filgrastim, pegfilgrastim, sargramostim  some other chemotherapy drugs like cisplatin  vaccines Talk to your doctor or health care professional before taking any of these medicines:  acetaminophen  aspirin  ibuprofen  ketoprofen  naproxen This list may not describe all possible interactions. Give your health care provider a list of all the medicines, herbs, non-prescription drugs, or  dietary supplements you use. Also tell them if you smoke, drink alcohol, or use illegal drugs. Some items may interact with your medicine. What should I watch for while using this medicine? Visit your doctor for checks on your progress. This drug may make you feel generally unwell. This is not uncommon, as chemotherapy can affect healthy cells as well as cancer cells. Report any side effects. Continue your course of treatment even though you feel ill unless your doctor tells you to stop. In some cases, you may be given additional medicines to help with side effects. Follow all directions for their use. Call your doctor or health care professional for advice if you get a fever, chills or sore throat, or other symptoms of a cold or flu. Do not treat yourself. This drug decreases your body's ability to fight infections. Try to avoid being around people who are sick. This medicine may increase your risk to bruise or bleed. Call your doctor or health care professional if you notice any unusual bleeding. Be careful brushing and flossing your teeth or using a toothpick because you may get an infection or bleed more easily. If you have any dental work done, tell your dentist you are receiving this medicine. Avoid taking products that contain aspirin, acetaminophen, ibuprofen, naproxen, or ketoprofen unless instructed by your doctor. These medicines may hide a fever. Do not become pregnant while taking this medicine or for 6 months after stopping it. Women should inform their doctor if they wish to become pregnant or think they might be pregnant. Men should not father a child while taking this medicine and for 3 months after stopping it.  There is a potential for serious side effects to an unborn child. Talk to your health care professional or pharmacist for more information. Do not breast-feed an infant while taking this medicine or for at least 1 week after stopping it. Men should inform their doctors if they wish  to father a child. This medicine may lower sperm counts. Talk with your doctor or health care professional if you are concerned about your fertility. What side effects may I notice from receiving this medicine? Side effects that you should report to your doctor or health care professional as soon as possible:  allergic reactions like skin rash, itching or hives, swelling of the face, lips, or tongue  breathing problems  pain, redness, or irritation at site where injected  signs and symptoms of a dangerous change in heartbeat or heart rhythm like chest pain; dizziness; fast or irregular heartbeat; palpitations; feeling faint or lightheaded, falls; breathing problems  signs of decreased platelets or bleeding - bruising, pinpoint red spots on the skin, black, tarry stools, blood in the urine  signs of decreased red blood cells - unusually weak or tired, feeling faint or lightheaded, falls  signs of infection - fever or chills, cough, sore throat, pain or difficulty passing urine  signs and symptoms of kidney injury like trouble passing urine or change in the amount of urine  signs and symptoms of liver injury like dark yellow or brown urine; general ill feeling or flu-like symptoms; light-colored stools; loss of appetite; nausea; right upper belly pain; unusually weak or tired; yellowing of the eyes or skin  swelling of ankles, feet, hands Side effects that usually do not require medical attention (report to your doctor or health care professional if they continue or are bothersome):  constipation  diarrhea  hair loss  loss of appetite  nausea  rash  vomiting This list may not describe all possible side effects. Call your doctor for medical advice about side effects. You may report side effects to FDA at 1-800-FDA-1088. Where should I keep my medicine? This drug is given in a hospital or clinic and will not be stored at home. NOTE: This sheet is a summary. It may not cover all  possible information. If you have questions about this medicine, talk to your doctor, pharmacist, or health care provider.  2020 Elsevier/Gold Standard (2017-04-06 18:06:11)

## 2019-07-13 NOTE — Progress Notes (Signed)
DISCONTINUE ON PATHWAY REGIMEN - Non-Small Cell Lung     A cycle is every 21 days:     Pemetrexed   **Always confirm dose/schedule in your pharmacy ordering system**  REASON: Other Reason PRIOR TREATMENT: [id number]: Pemetrexed 500 mg/m2 q21 Days Until Progression or Unacceptable Toxicity TREATMENT RESPONSE: Unable to Evaluate  START OFF PATHWAY REGIMEN - Non-Small Cell Lung   [id number]:Gemcitabine 1,000 mg/m2 D1, 8  q21 Days:   A cycle is every 21 days:     Gemcitabine   **Always confirm dose/schedule in your pharmacy ordering system**  Patient Characteristics: Stage IV Metastatic, Nonsquamous, Initial Chemotherapy/Immunotherapy, PS = 2, ALK Rearrangement Negative and EGFR Mutation Negative/Non-Sensitizing and ROS1 Rearrangement Negative and NTRK Gene Fusion-Negative and RET Gene Fusion-Negative, PD-L1  Expression Positive 1-49% (TPS) / Negative / Not Tested / Awaiting Test Results Therapeutic Status: Stage IV Metastatic Histology: Nonsquamous Cell ROS1 Rearrangement Status: Negative Other Mutations/Biomarkers: No Other Actionable Mutations NTRK Gene Fusion Status: Negative PD-L1 Expression Status: PD-L1 Positive 1-49% (TPS) Chemotherapy/Immunotherapy LOT: Initial Chemotherapy/Immunotherapy Molecular Targeted Therapy: Not Appropriate MET Exon 14 Mutation Status: Negative RET Gene Fusion Status: Negative ALK Rearrangement Status: Negative EGFR Mutation Status: Negative/Wild Type BRAF [id number] Mutation Status: Negative ECOG Performance Status: 2 Biomarker Assessment Status Confirmation: All Genomic Markers Negative or Only MET+ or BRAF+ Intent of Therapy: Non-Curative / Palliative Intent, Discussed with Patient

## 2019-07-13 NOTE — Progress Notes (Signed)
ON PATHWAY REGIMEN - Non-Small Cell Lung  No Change  Continue With Treatment as Ordered.     A cycle is every 21 days:     Pemetrexed   **Always confirm dose/schedule in your pharmacy ordering system**  Patient Characteristics: Stage IV Metastatic, Nonsquamous, Initial Chemotherapy/Immunotherapy, PS = 2, ALK Rearrangement Negative and EGFR Mutation Negative/Non-Sensitizing and ROS1 Rearrangement Negative and NTRK Gene Fusion-Negative and RET Gene Fusion-Negative, PD-L1  Expression Positive 1-49% (TPS) / Negative / Not Tested / Awaiting Test Results Therapeutic Status: Stage IV Metastatic Histology: Nonsquamous Cell ROS1 Rearrangement Status: Negative Other Mutations/Biomarkers: No Other Actionable Mutations NTRK Gene Fusion Status: Negative PD-L1 Expression Status: PD-L1 Positive 1-49% (TPS) Chemotherapy/Immunotherapy LOT: Initial Chemotherapy/Immunotherapy Molecular Targeted Therapy: Not Appropriate MET Exon 14 Mutation Status: Negative RET Gene Fusion Status: Negative ALK Rearrangement Status: Negative EGFR Mutation Status: Negative/Wild Type BRAF V600E Mutation Status: Negative ECOG Performance Status: 2 Biomarker Assessment Status Confirmation: All Genomic Markers Negative or Only MET+ or BRAF+ Intent of Therapy: Non-Curative / Palliative Intent, Discussed with Patient

## 2019-07-13 NOTE — Progress Notes (Signed)
[facility]  [address] [place],  [address] Phone: [phone]  Fax: [phone]   Clinic Day:  07/17/2019  Referring physician: [name], MD  Chief Complaint: Andres Hebert is a 86 y.o. male with stage IV adenocarcinoma of therightlung who is seen for assessment prior to day 1 of cycle #1 gemcitabine.  HPI: The patient was last seen in the medical oncology clinic on 07/13/2019. At that time, he denied any concerns.  He was eating small meals and drinking fluids.  Despite drinking 50 oz/day and receiving IVF x 2 days, he creatinine remained < 45 ml/min (38.7 ml/min).  Decision was made to switch from Alimta to gemcitabine. Hematocrit was 37.9, hemoglobin 13.0, platelets 156,000, WBC 6,200.  Sodium was 134.   During the interim, he has been eating better and has been drinking a lot of fluids. He has been constipated so he takes Miralax and uses a suppository occasionally.  He denies nausea, vomiting, and diarrhea. He has been active and has been completing some chores around the house. The patient and his wife were given printed information about gemcitabine and would like to proceed with C1D1 today.   Past Medical History:  Diagnosis Date  . BPV (benign positional vertigo)   . Cancer ([place])    lung cancer stage IV  . Chronic constipation   . Collapsed lung 1959   right  . Diabetes mellitus without complication (HCC)    borderline  . History of hiatal hernia   . Hyperlipidemia   . Hypertension     Past Surgical History:  Procedure Laterality Date  . CATARACT EXTRACTION, BILATERAL    . COLONOSCOPY    . ESOPHAGOGASTRODUODENOSCOPY    . EYE SURGERY    . KYPHOPLASTY N/A 02/20/2019   Procedure: L3 KYPHOPLASTY;  Surgeon: Hessie Knows, MD;  Location: [facility] ORS;  Service: Orthopedics;  Laterality: N/A;  . PORTA CATH INSERTION N/A 11/27/2018   Procedure: PORTA CATH INSERTION;  Surgeon: Algernon Huxley, MD;  Location: [facility];  Service: Cardiovascular;  Laterality: N/A;    Family History  Problem Relation Age of Onset  . Stroke Mother   . Epilepsy Mother   . Prostate cancer Father     Social History:  reports that he quit smoking about 45 years ago. His smoking use included cigarettes. He has a 25.00 pack-year smoking history. He has never used smokeless tobacco. He reports previous alcohol use. He reports that he does not use drugs. He stoppedsmoking about 45 years ago.He denies any exposure to asbestos, radiation or toxins.He worked for [employer] and [place] recently lost his son. He lives in [place].His wife's name isSheila. The patient is accompanied by [name] on the iPad today.   Allergies:  Allergies  Allergen Reactions  . Accupril [Quinapril Hcl] Other (See Comments)    dizziness per MR    Current Medications: Current Outpatient Medications  Medication Sig Dispense Refill  . amLODipine (NORVASC) 2.5 MG tablet Take 2.5 mg by mouth daily.    Marland Kitchen aspirin EC 81 MG tablet Take 81 mg by mouth daily.    . citalopram (CELEXA) 10 MG tablet Take 1 tablet (10 mg total) by mouth daily. May increase to two tablets (52m total) after one week if tolerating 45 tablet 0  . feeding supplement, ENSURE ENLIVE, (ENSURE ENLIVE) LIQD Take 237 mLs by mouth 3 (three) times daily between meals. (Patient taking differently: Take 237 mLs by mouth 4 (four) times a week. )  237 mL 0  . lidocaine-prilocaine (EMLA) cream Apply 1 application topically as needed. 30 g 1  . oxyCODONE (OXY IR/ROXICODONE) 5 MG immediate release tablet Take 1 tablet every 4 hours as needed for pain.  Keep a pain diary. 40 tablet 0  . polyethylene glycol (MIRALAX / GLYCOLAX) 17 g packet Take 17 g by mouth 2 (two) times daily. 14 each 0  . potassium chloride (K-DUR,KLOR-CON) 10 MEQ tablet Take 10 mEq by mouth 2 (two) times daily.    . potassium chloride (KLOR-CON) 10 MEQ tablet Take 10 mEq by mouth 2 (two) times daily.    . pravastatin (PRAVACHOL) 40  MG tablet Take 40 mg by mouth daily.    Marland Kitchen senna (SENOKOT) 8.6 MG TABS tablet Take 1 tablet (8.6 mg total) by mouth daily as needed for mild constipation. 30 tablet 0  . traZODone (DESYREL) 50 MG tablet Take 100 mg by mouth at bedtime.     . triamterene-hydrochlorothiazide (MAXZIDE-25) 37.5-25 MG tablet Take 1 tablet by mouth daily.    . OXYGEN Inhale into the lungs as needed. (Patient not taking: Reported on 07/10/2019)     No current facility-administered medications for this visit.    Review of Systems  Constitutional: Positive for malaise/fatigue (in bed 12-15 hrs per day) and weight loss (5 lbs). Negative for chills, diaphoresis and fever.       Doing good.  HENT: Negative for congestion, ear discharge, ear pain, hearing loss, nosebleeds, sinus pain, sore throat and tinnitus.   Eyes: Negative for blurred vision.  Respiratory: Negative for cough, hemoptysis, sputum production and shortness of breath.   Cardiovascular: Negative for chest pain, palpitations and leg swelling.  Gastrointestinal: Positive for constipation (Takes Miralax and a suppository). Negative for abdominal pain, blood in stool, diarrhea, heartburn, melena, nausea and vomiting.       Eating smaller meals. Early satiety. Drinking boost BID. Drinking more than 50 oz of fluid daily.  Genitourinary: Negative for dysuria, flank pain, frequency, hematuria and urgency.  Musculoskeletal: Negative for back pain (occasional), joint pain, myalgias and neck pain.  Skin: Negative for itching and rash.  Neurological: Positive for weakness (generalized). Negative for dizziness, tingling, sensory change and headaches.  Endo/Heme/Allergies: Does not bruise/bleed easily.  Psychiatric/Behavioral: Positive for memory loss (worsening). Negative for depression. The patient is not nervous/anxious and does not have insomnia.   All other systems reviewed and are negative.   Performance status (ECOG): 2  Vitals Blood pressure 112/62, pulse 61,  temperature (!) 97.3 F (36.3 C), temperature source Tympanic, resp. rate 18, height 5' 6"  (1.676 m), weight 124 lb 14.4 oz (56.7 kg), SpO2 98 %.   Physical Exam Vitals and nursing note reviewed.  Constitutional:      General: He is not in acute distress.    Appearance: He is well-developed. He is not diaphoretic.     Comments: Thin gentleman sitting comfortably in a wheelchair. Examined in wheelchair.  HENT:     Head: Normocephalic and atraumatic.     Mouth/Throat:     Mouth: Mucous membranes are moist.     Pharynx: Oropharynx is clear. No oropharyngeal exudate.  Eyes:     General: No scleral icterus.    Conjunctiva/sclera: Conjunctivae normal.     Pupils: Pupils are equal, round, and reactive to light.     Comments: Glasses. Blue eyes.  Cardiovascular:     Rate and Rhythm: Normal rate and regular rhythm.     Pulses: Normal pulses.  Heart sounds: Normal heart sounds. No murmur heard.   Pulmonary:     Effort: Pulmonary effort is normal. No respiratory distress.     Breath sounds: Normal breath sounds. No wheezing or rales.  Chest:     Chest wall: No tenderness.  Abdominal:     General: Bowel sounds are normal. There is no distension.     Palpations: Abdomen is soft. There is no mass.     Tenderness: There is no abdominal tenderness. There is no guarding or rebound.  Musculoskeletal:        General: No swelling or tenderness. Normal range of motion.     Cervical back: Normal range of motion and neck supple.     Right lower leg: No edema.     Left lower leg: No edema.  Lymphadenopathy:     Head:     Right side of head: No preauricular, posterior auricular or occipital adenopathy.     Left side of head: No preauricular, posterior auricular or occipital adenopathy.     Cervical: No cervical adenopathy.     Upper Body:     Right upper body: No supraclavicular or axillary adenopathy.     Left upper body: No supraclavicular or axillary adenopathy.     Lower Body: No right  inguinal adenopathy. No left inguinal adenopathy.  Skin:    General: Skin is warm and dry.  Neurological:     Mental Status: He is alert and oriented to person, place, and time.  Psychiatric:        Mood and Affect: Mood normal.        Behavior: Behavior normal.        Thought Content: Thought content normal.        Judgment: Judgment normal.    Infusion on 07/17/2019  Component Date Value Ref Range Status  . Sodium 07/17/2019 137  135 - 145 mmol/L Final  . Potassium 07/17/2019 3.5  3.5 - 5.1 mmol/L Final  . Chloride 07/17/2019 97* 98 - 111 mmol/L Final  . CO2 07/17/2019 31  22 - 32 mmol/L Final  . Glucose, Bld 07/17/2019 103* 70 - 99 mg/dL Final   Glucose reference range applies only to samples taken after fasting for at least 8 hours.  . BUN 07/17/2019 25* 8 - 23 mg/dL Final  . Creatinine, Ser 07/17/2019 1.01  0.61 - 1.24 mg/dL Final  . Calcium 07/17/2019 9.1  8.9 - 10.3 mg/dL Final  . Total Protein 07/17/2019 6.9  6.5 - 8.1 g/dL Final  . Albumin 07/17/2019 3.9  3.5 - 5.0 g/dL Final  . AST 07/17/2019 18  15 - 41 U/L Final  . ALT 07/17/2019 17  0 - 44 U/L Final  . Alkaline Phosphatase 07/17/2019 51  38 - 126 U/L Final  . Total Bilirubin 07/17/2019 0.7  0.3 - 1.2 mg/dL Final  . GFR calc non Af Amer 07/17/2019 >60  >60 mL/min Final  . GFR calc Af Amer 07/17/2019 >60  >60 mL/min Final  . Anion gap 07/17/2019 9  5 - 15 Final   Performed at [facility], [address] [place], [place] [address]  . WBC 07/17/2019 5.1  4.0 - 10.5 K/uL Final  . RBC 07/17/2019 3.99* 4.22 - 5.81 MIL/uL Final  . Hemoglobin 07/17/2019 13.1  13.0 - 17.0 g/dL Final  . HCT 07/17/2019 38.3* 39 - 52 % Final  . MCV 07/17/2019 96.0  80.0 - 100.0 fL Final  . MCH 07/17/2019 32.8  26.0 -  34.0 pg Final  . MCHC 07/17/2019 34.2  30.0 - 36.0 g/dL Final  . RDW 07/17/2019 12.6  11.5 - 15.5 % Final  . Platelets 07/17/2019 122* 150 - 400 K/uL Final   Comment: Immature Platelet Fraction may be clinically  indicated, consider ordering this additional test [id number]   . nRBC 07/17/2019 0.0  0.0 - 0.2 % Final  . Neutrophils Relative % 07/17/2019 64  % Final  . Neutro Abs 07/17/2019 3.3  1.7 - 7.7 K/uL Final  . Lymphocytes Relative 07/17/2019 27  % Final  . Lymphs Abs 07/17/2019 1.4  0.7 - 4.0 K/uL Final  . Monocytes Relative 07/17/2019 8  % Final  . Monocytes Absolute 07/17/2019 0.4  0 - 1 K/uL Final  . Eosinophils Relative 07/17/2019 1  % Final  . Eosinophils Absolute 07/17/2019 0.0  0 - 0 K/uL Final  . Basophils Relative 07/17/2019 0  % Final  . Basophils Absolute 07/17/2019 0.0  0 - 0 K/uL Final  . Immature Granulocytes 07/17/2019 0  % Final  . Abs Immature Granulocytes 07/17/2019 0.02  0.00 - 0.07 K/uL Final   Performed at [facility], [address], [place], [place] [address]    Assessment:  Andres Hebert is a 86 y.o. male with metastaticadenocarcinoma of thelungs/p right sided thoracentesis on 10/06/2018.Cytologyconfirmed non-small cell carcinoma, favor adenocarcinoma of the lung.He has a 25-30 pack year smoking history.He has clinical stageT4NxM1b.  PD-L1 testing was 80% on 11/09/2018. Foundation Onetesting revealednumber mutational burden (TMB)18 Muts/Mb, MSI-stable, ATM splice site 1093-2T>F, CDKN2A loss, CDKN2B loss, DNMT3A Y533C, FGF10 amplification, KRAS G12C, MTAP loss, and RBM10 A456f*216.  Chest CT angiogramon 10/05/2018 revealed a 8.4 x 6.9 cm right lower lobe mass with numerous pleural nodules and a large right pleural effusion. There was bony destruction of the right lateral 7th and 8th ribs. There was no pulmonary embolism.  Head MRIon 10/07/2018 revealed no metastatic disease or acute intracranial abnormality.There was distal left vertebral artery with poor flow or occlusion, most likely due to atherosclerosis.  PET scanon 10/18/2018 revealed a large hypermetabolic RIGHT lower lobe mass consistent with bronchogenic carcinoma.  There was multifocal pleural metastasis within the RIGHT hemithorax. There was one pleural metastatic lesion invading adjacent RIGHT seventh rib, and moderate RIGHT effusion. There was probable RIGHT hilar metastatic adenopathy, and no mediastinal or supraclavicular metastatic adenopathy.   He is s/pthoracentesisx 2 (10/06/2018 and10/05/2018).CT biopsyof the right lower lobelung masson 10/31/2018 was performed for mutational studies.  He is s/p cycle #1 Alimta and pembrolizumabon 11/03/2018.He iss/p8cycles ofpembrolizumabalone (11/24/2018-04/27/2019).  Chest CTon 02/12/2019 revealed interval improvement in the patient's malignancy. The primary mass was3.9 x 3.6 cm (previously 5.7 x 7.5 cm). The right pleural nodularityhadimproved insize andnumberof lesions(nodularity abutting right 5th rib was1.5 x 0.5 cm; previously 2.9 x 1.4 cm). There were sclerotic changes in multiple right-sided ribsc/wtreated metastatic disease. There was no right hilar adenopathy. The right pleural effusion was smaller. There was severe emphysematous changes in the lungs. There was no metastatic disease below the diaphragm.   Bone scanon 02/14/2019 revealed a linear bandlike uptake along the inferior aspect of the L3 vertebral body. This would not be typical for metastatic disease and may be related to compression fracture. Lumbar spine MRI could be used to further evaluate. There was radiotracer accumulation in the lateral right fifth and seventh ribsc/wknown metastatic disease as characterized on previous chest CT and PET-CT. There was no unexpected uptake in the bony pelvis.  Lumbar spine MRIon 02/15/2019 revealed amild  inferior endplate compression fracture of L3with vertebral body height loss of up to approximately 30% has an appearance most c/wa senile osteoporotic injury.There was no evidencefor metastatic disease. There was partial visualization of a right pleural effusion.  The patient had a right effusion on the prior chest CT.There was an approximately 3.6 cm abdominal aortic aneurysm(chronic).He underwent L3 kyphoplastyon 02/20/2019.  Chest CTon 05/15/2019 revealed slight interval decrease in size and fullness of a spiculated infrahilar mass of the right lower lobe, with extensive postobstructive consolidation of the right lung base and a small right pleural effusion. There was interval increase in size in multiple subpleural soft tissue nodules about the right lung c/w worsened pleural metastatic disease. There were unchanged sclerotic rib lesions.   He has a history of renal insufficiencyon 05/28/2019. Renal ultrasoundon 05/28/2019 showed bilateral simple renal cystswithout hydronephrosis.  SPEP on 07/02/2019 revealed a poorly defined band of restricted protein mobility in the gamma globulins.  He has memory loss issues.  Brain MRI on 07/08/2019 revealed no intracranial metastatic disease. There was multifocal white matter hyperintensity, most commonly due to chronic ischemic microangiopathy.  He received the influenza vaccineon 10/13/2018. He received his second COVID-19 vaccineon 04/19/2019.  Symptomatically, he denies any shortness of breath.  He is eating and drinking better.  He has been doing chores around the house.  He wishes to proceed chemotherapy today.  Plan: 1.   Labs:  CBC with diff, CMP. 2. Stage IV adenocarcinoma of the lung Clinically, he is doing fairly well. Patient iss/p 1 cycle of Alimta and pembrolizumab. Patient is s/p8cycles ofpembrolizumab alone(last04/02/2019). Chest CT on 04/02/2021revealeda mixed response with slight decrease in the right infrahilar mass and increase in subpleural nodules in the right lung. Pembrolizumabwas discontinued (last dose on 04/27/2019). He has been unable to receive Alimta secondary to CrCl < 45  ml/min.  Discuss plan to proceed with gemcitabine weekly x2 with 1 week off.   Potential side effects reviewed.   Patient consented to treatment.  Labs reviewed.  Begin day 1 of cycle #1 gemcitabine today.  Discuss symptom management.  He has antiemeticsat home to use on a prn bases.  Interventions are adequate.    3. Renal insufficiency Creatinine 1.01 (today. Baseline creatinine 0.82 - 1.01. Renal ultrasound revealed no evidence of obstruction.  Continue to encourage fluids. 4.Bone metastasis Patienthasright lower ribmetastasis seen on chest CT. Bone scan on 02/14/2019 revealed only lateral right 5th and 7th ribs c/w metastatic disease. Delton See has been held secondary to fluctuating creatinine. 5.Cancer-related pain Patient denies any pain.  Continue to monitor. 6. Hypokalemia, resolved Potassium3.5today. Continueto monitor. 7. Weight loss Patient has lost an additional 5 pounds. Gust importance of caloric intake and supplemental shakes. 8.Day 1 of cycle #1 gemcitabine today. 9.   RTC in 1 week for MD assessment, labs (CBC with diff, CMP) and day 8 of cycle #1 gemcitabine.  I discussed the assessment and treatment plan with the patient.  The patient was provided an opportunity to ask questions and all were answered.  The patient agreed with the plan and demonstrated an understanding of the instructions.  The patient was advised to call back if the symptoms worsen or if the condition fails to improve as anticipated.   Lequita Asal, MD, PhD    07/17/2019, 11:14 AM  I, De Burrs, am acting as Education administrator for [employer]. Mike Gip, MD, PhD.  I, Atzel Mccambridge C. Mike Gip, MD, have reviewed the above documentation for accuracy and completeness, and I agree with the above.

## 2019-07-17 ENCOUNTER — Inpatient Hospital Stay: Payer: Medicare HMO

## 2019-07-17 ENCOUNTER — Other Ambulatory Visit: Payer: Self-pay

## 2019-07-17 ENCOUNTER — Encounter: Payer: Self-pay | Admitting: Hematology and Oncology

## 2019-07-17 ENCOUNTER — Inpatient Hospital Stay (HOSPITAL_BASED_OUTPATIENT_CLINIC_OR_DEPARTMENT_OTHER): Payer: Medicare HMO | Admitting: Hematology and Oncology

## 2019-07-17 VITALS — BP 112/62 | HR 61 | Temp 97.3°F | Resp 18 | Ht 66.0 in | Wt 124.9 lb

## 2019-07-17 DIAGNOSIS — Z5111 Encounter for antineoplastic chemotherapy: Secondary | ICD-10-CM

## 2019-07-17 DIAGNOSIS — C7951 Secondary malignant neoplasm of bone: Secondary | ICD-10-CM | POA: Diagnosis not present

## 2019-07-17 DIAGNOSIS — N289 Disorder of kidney and ureter, unspecified: Secondary | ICD-10-CM

## 2019-07-17 DIAGNOSIS — C3431 Malignant neoplasm of lower lobe, right bronchus or lung: Secondary | ICD-10-CM

## 2019-07-17 DIAGNOSIS — G893 Neoplasm related pain (acute) (chronic): Secondary | ICD-10-CM

## 2019-07-17 LAB — CBC WITH DIFFERENTIAL/PLATELET
Abs Immature Granulocytes: 0.02 10*3/uL (ref 0.00–0.07)
Basophils Absolute: 0 10*3/uL (ref 0.0–0.1)
Basophils Relative: 0 %
Eosinophils Absolute: 0 10*3/uL (ref 0.0–0.5)
Eosinophils Relative: 1 %
HCT: 38.3 % — ABNORMAL LOW (ref 39.0–52.0)
Hemoglobin: 13.1 g/dL (ref 13.0–17.0)
Immature Granulocytes: 0 %
Lymphocytes Relative: 27 %
Lymphs Abs: 1.4 10*3/uL (ref 0.7–4.0)
MCH: 32.8 pg (ref 26.0–34.0)
MCHC: 34.2 g/dL (ref 30.0–36.0)
MCV: 96 fL (ref 80.0–100.0)
Monocytes Absolute: 0.4 10*3/uL (ref 0.1–1.0)
Monocytes Relative: 8 %
Neutro Abs: 3.3 10*3/uL (ref 1.7–7.7)
Neutrophils Relative %: 64 %
Platelets: 122 10*3/uL — ABNORMAL LOW (ref 150–400)
RBC: 3.99 MIL/uL — ABNORMAL LOW (ref 4.22–5.81)
RDW: 12.6 % (ref 11.5–15.5)
WBC: 5.1 10*3/uL (ref 4.0–10.5)
nRBC: 0 % (ref 0.0–0.2)

## 2019-07-17 LAB — COMPREHENSIVE METABOLIC PANEL
ALT: 17 U/L (ref 0–44)
AST: 18 U/L (ref 15–41)
Albumin: 3.9 g/dL (ref 3.5–5.0)
Alkaline Phosphatase: 51 U/L (ref 38–126)
Anion gap: 9 (ref 5–15)
BUN: 25 mg/dL — ABNORMAL HIGH (ref 8–23)
CO2: 31 mmol/L (ref 22–32)
Calcium: 9.1 mg/dL (ref 8.9–10.3)
Chloride: 97 mmol/L — ABNORMAL LOW (ref 98–111)
Creatinine, Ser: 1.01 mg/dL (ref 0.61–1.24)
GFR calc Af Amer: >60 mL/min (ref >60)
GFR calc non Af Amer: >60 mL/min (ref >60)
Glucose, Bld: 103 mg/dL — ABNORMAL HIGH (ref 70–99)
Potassium: 3.5 mmol/L (ref 3.5–5.1)
Sodium: 137 mmol/L (ref 135–145)
Total Bilirubin: 0.7 mg/dL (ref 0.3–1.2)
Total Protein: 6.9 g/dL (ref 6.5–8.1)

## 2019-07-17 MED ORDER — SODIUM CHLORIDE 0.9 % IV SOLN
Freq: Once | INTRAVENOUS | Status: AC
  Filled 2019-07-17: qty 250

## 2019-07-17 MED ORDER — HEPARIN SOD (PORK) LOCK FLUSH 100 UNIT/ML IV SOLN
INTRAVENOUS | Status: AC
  Filled 2019-07-17: qty 5

## 2019-07-17 MED ORDER — SODIUM CHLORIDE 0.9 % IV SOLN
800.0000 mg/m2 | Freq: Once | INTRAVENOUS | Status: AC
  Administered 2019-07-17: 1330 mg via INTRAVENOUS
  Filled 2019-07-17: qty 26.3

## 2019-07-17 MED ORDER — HEPARIN SOD (PORK) LOCK FLUSH 100 UNIT/ML IV SOLN
500.0000 [IU] | Freq: Once | INTRAVENOUS | Status: AC | PRN
  Administered 2019-07-17: 500 [IU]
  Filled 2019-07-17: qty 5

## 2019-07-17 MED ORDER — ONDANSETRON HCL 4 MG PO TABS
8.0000 mg | ORAL_TABLET | Freq: Once | ORAL | Status: AC
  Administered 2019-07-17: 8 mg via ORAL
  Filled 2019-07-17: qty 2

## 2019-07-17 NOTE — Progress Notes (Signed)
The patient c/o constipation at times

## 2019-07-19 ENCOUNTER — Ambulatory Visit: Payer: Medicare HMO

## 2019-07-20 ENCOUNTER — Ambulatory Visit: Payer: Medicare HMO

## 2019-07-23 ENCOUNTER — Inpatient Hospital Stay: Payer: Medicare HMO

## 2019-07-23 ENCOUNTER — Encounter: Payer: Self-pay | Admitting: Hematology and Oncology

## 2019-07-23 DIAGNOSIS — R410 Disorientation, unspecified: Secondary | ICD-10-CM | POA: Insufficient documentation

## 2019-07-23 NOTE — Progress Notes (Signed)
Patient wife stated that he sleeps a lot. He has multiple falls. 1 fall busied his back 23 falls in the living room took a long time getting him up and 1 fall in the bedroom beside the bed and the wife had to call EMS to help get him up.

## 2019-07-23 NOTE — Progress Notes (Signed)
Nutrition Assessment:  Referral from Dr. Mike Gip regarding poor appetite and weight loss.   86 year old male with stage IV lung cancer.  Past medical history of DM, constipation, HLD, HTN.  Patient recently switched treatment to gemcitabine.    Spoke with wife, Andres Hebert this pm as patient sleeping.  Wife reports that patient has no appetite and does not want to eat.  Reports yesterday he drank and ensure, ate few green beans, little bit of cabbage, slice of tomato, few bites of corn and some lemon pudding. Drinks whole milk with meals.  Today has slept most of the day.  Reports that he has not been drinking much and has fallen 4 times in the last 3 days.  Reports that she was able to get him up the first 3 times but EMS got him up the last time and checked him out. Wife reports blood pressure and glucose was normal.    Reports constipation and then took something and had diarrhea.  Wife says he want eat meat now as thought it gave him diarrhea.   Medications: K, senna  Labs: reviewed  Anthropometrics:   Height: 66 inches Weight: 124 lb 14.4 oz 6/22 129 lb 6/15 137 lb 5/10 136 lb 4/2 BMI: 20  9% weight loss in the last month, significant  Estimated Energy Needs  Kcals: 1700-1900 Protein: 85-95 g Fluid: > 1.7 L  NUTRITION DIAGNOSIS: Inadequate oral intake related to cancer and related treatment side effects as evidenced by 9% weight loss and poor po intake   MALNUTRITION DIAGNOSIS: likely but unable to determine   INTERVENTION:  Patient may benefit from trial of appetite stimulant.   Message sent to provider regarding falls and decreased intake.  Wife will discuss with provider tomorrow at appointment.   Encouraged small frequent feedings (small amounts offered, 1 peanut butter cracker) Discussed ways to add more calorie and protein to food. Will mail handout to wife.   Encouraged 350 calorie shake.  Wife unsure calories in current ensure that patient is drinking.   Contact  information given to wife    MONITORING, EVALUATION, GOAL: weight trends, intake, poc   NEXT VISIT: July 26, phone f/u  Janyia Guion B. Zenia Resides, [place], [place] Registered Dietitian [phone] (pager)

## 2019-07-23 NOTE — Progress Notes (Signed)
[facility]  [address] [place], [place] [address] Phone: [phone]  Fax: [phone]   Clinic Day:  07/24/2019  Referring physician: [name], MD  Chief Complaint: Andres Hebert is a 86 y.o. male with stage IV adenocarcinoma of therightlung who is seen for assessment prior to day 8 of cycle #1 gemcitabine.  HPI: The patient was last seen in the medical oncology clinic on 07/17/2019. At that time, he was eating better and drinking a lot of luids.  He was doing some chores around the house.  Hematocrit was 38.3, hemoglobin 13.1, platelets 122,000, WBC 5,100. Creatinine was 1.01. He received day 1 of cycle #1 gemcitabine.  The patient's wife spoke to Jennet Maduro, RD on 07/23/2019.  He was noted to have no appetite and did not want to eat.  He drank Ensure and a few bites of green beans a little bit of cabbage, tomato and a few bites of corn with some lemon pudding the day before.  He was drinking whole milk with meals.  He had slept much of the day yesterday.  He had fallen 4 times in the last 3 days.  With the last episode, EMS was called and checked him out.  Blood pressure and glucose were normal.  He had some constipation and took something that caused diarrhea.    During the interim, he reports that "he is fading away" and he feels bad. He has fallen 4x during the interim. At the time of the first fall, he had gotten up to use the bathroom and fell in the doorway. He had another fall in the kitchen. His back is hurting again after the falls. He has been taking oxycodone BID, which helps some. He likes to stay in bed because he only hurts when he moves.  His wife did not feel like the chemotherapy was the cause of these episodes. She says that he was constipated so he used a suppository the other day, which gave him diarrhea. Since the diarrhea began, he has had no appetite, which has made him weak. The patient drinks Boost and Ensure with ice  cream in it. Yesterday, he ate scrambled eggs with cheese. The patient declined Megace because of the potential risk for blood clots.  The patient's wife was given a MOST form today.   Past Medical History:  Diagnosis Date  . BPV (benign positional vertigo)   . Cancer ([place])    lung cancer stage IV  . Chronic constipation   . Collapsed lung 1959   right  . Diabetes mellitus without complication (HCC)    borderline  . History of hiatal hernia   . Hyperlipidemia   . Hypertension     Past Surgical History:  Procedure Laterality Date  . CATARACT EXTRACTION, BILATERAL    . COLONOSCOPY    . ESOPHAGOGASTRODUODENOSCOPY    . EYE SURGERY    . KYPHOPLASTY N/A 02/20/2019   Procedure: L3 KYPHOPLASTY;  Surgeon: Hessie Knows, MD;  Location: [facility] ORS;  Service: Orthopedics;  Laterality: N/A;  . PORTA CATH INSERTION N/A 11/27/2018   Procedure: PORTA CATH INSERTION;  Surgeon: Algernon Huxley, MD;  Location: [facility];  Service: Cardiovascular;  Laterality: N/A;    Family History  Problem Relation Age of Onset  . Stroke Mother   . Epilepsy Mother   . Prostate cancer Father     Social History:  reports that he quit smoking about 45 years ago. His smoking use included  cigarettes. He has a 25.00 pack-year smoking history. He has never used smokeless tobacco. He reports previous alcohol use. He reports that he does not use drugs. He stoppedsmoking about 45 years ago.He denies any exposure to asbestos, radiation or toxins.He worked for [employer] and [employer] recently lost his son. He lives in [place].His wife's name isSheila. The patient is accompanied by [name] on the iPad today.   Allergies:  Allergies  Allergen Reactions  . Accupril [Quinapril Hcl] Other (See Comments)    dizziness per MR    Current Medications: Current Outpatient Medications  Medication Sig Dispense Refill  . aspirin EC 81 MG tablet Take 81 mg by mouth daily.    . citalopram (CELEXA) 10 MG tablet Take 1 tablet  (10 mg total) by mouth daily. May increase to two tablets (37m total) after one week if tolerating (Patient taking differently: Take 10 mg by mouth daily. ) 45 tablet 0  . feeding supplement, ENSURE ENLIVE, (ENSURE ENLIVE) LIQD Take 237 mLs by mouth 3 (three) times daily between meals. (Patient taking differently: Take 237 mLs by mouth 2 (two) times daily between meals. ) 237 mL 0  . lidocaine-prilocaine (EMLA) cream Apply 1 application topically as needed. (Patient taking differently: Apply 1 application topically daily as needed (port access). ) 30 g 1  . oxyCODONE (OXY IR/ROXICODONE) 5 MG immediate release tablet Take 1 tablet every 4 hours as needed for pain.  Keep a pain diary. (Patient taking differently: Take 5 mg by mouth every 4 (four) hours as needed for moderate pain. ) 30 tablet 0  . polyethylene glycol (MIRALAX / GLYCOLAX) 17 g packet Take 17 g by mouth 2 (two) times daily. (Patient taking differently: Take 17 g by mouth daily as needed for mild constipation. ) 14 each 0  . potassium chloride (KLOR-CON) 10 MEQ tablet Take 10 mEq by mouth 2 (two) times daily.    . pravastatin (PRAVACHOL) 40 MG tablet Take 40 mg by mouth daily.    . traZODone (DESYREL) 50 MG tablet Take 100 mg by mouth at bedtime.     . triamterene-hydrochlorothiazide (MAXZIDE-25) 37.5-25 MG tablet Take 1 tablet by mouth daily.    .Marland Kitchenacetaminophen (TYLENOL) 500 MG tablet Take 1,000 mg by mouth every 6 (six) hours as needed for moderate pain or headache.    .Marland KitchenamLODipine (NORVASC) 5 MG tablet Take 5 mg by mouth daily.    .Marland Kitchendocusate sodium (COLACE) 100 MG capsule Take 100 mg by mouth daily.    . fexofenadine (ALLEGRA) 180 MG tablet Take 180 mg by mouth daily.    . folic acid (FOLVITE) 1 MG tablet Take 1 mg by mouth daily.    .Marland Kitchenibuprofen (ADVIL) 200 MG tablet Take 400 mg by mouth every 6 (six) hours as needed for headache or moderate pain.    .Marland Kitchenondansetron (ZOFRAN) 8 MG tablet Take 8 mg by mouth every 8 (eight) hours as needed  for nausea or vomiting.    . OXYGEN Inhale into the lungs as needed. (Patient not taking: Reported on 07/10/2019)     No current facility-administered medications for this visit.    Review of Systems  Constitutional: Positive for malaise/fatigue (in bed 12-15 hrs per day). Negative for chills, diaphoresis, fever and weight loss (up 1 lb).       Feels bad.  HENT: Negative for congestion, ear discharge, ear pain, hearing loss, nosebleeds, sinus pain, sore throat and tinnitus.   Eyes: Negative for blurred vision.  Respiratory: Negative  for cough, hemoptysis, sputum production and shortness of breath.   Cardiovascular: Negative for chest pain, palpitations and leg swelling.  Gastrointestinal: Positive for constipation (takes Miralax and a suppository). Negative for abdominal pain, blood in stool, diarrhea, heartburn, melena, nausea and vomiting.       Drinking boost and Ensure. Hasn't been eating well or drinking a lot of fluids.  Genitourinary: Negative for dysuria, flank pain, frequency, hematuria and urgency.  Musculoskeletal: Positive for back pain (s/p falls) and falls (3x during the interim). Negative for joint pain, myalgias and neck pain.  Skin: Negative for itching and rash.  Neurological: Positive for weakness (generalized). Negative for dizziness, tingling, sensory change and headaches.  Endo/Heme/Allergies: Does not bruise/bleed easily.  Psychiatric/Behavioral: Positive for memory loss (worsening). Negative for depression. The patient is not nervous/anxious and does not have insomnia.   All other systems reviewed and are negative.   Performance status (ECOG): 3  Vitals Blood pressure 111/65, pulse 83, temperature 99.1 F (37.3 C), temperature source Tympanic, resp. rate 18, height 5' 6"  (1.676 m), weight 125 lb 4.8 oz (56.8 kg), SpO2 100 %.   Physical Exam Vitals and nursing note reviewed.  Constitutional:      General: He is not in acute distress.    Appearance: He is  well-developed. He is not diaphoretic.     Comments: Fatigued appearing gentleman sitting comfortably in wheelchair in no acute distress.  HENT:     Head: Normocephalic and atraumatic.     Mouth/Throat:     Mouth: Mucous membranes are moist.     Pharynx: Oropharynx is clear. No oropharyngeal exudate.  Eyes:     General: No scleral icterus.    Extraocular Movements: Extraocular movements intact.     Conjunctiva/sclera: Conjunctivae normal.     Pupils: Pupils are equal, round, and reactive to light.     Comments: Glasses. Blue eyes.  Cardiovascular:     Rate and Rhythm: Normal rate and regular rhythm.     Pulses: Normal pulses.     Heart sounds: Normal heart sounds. No murmur heard.   Pulmonary:     Effort: Pulmonary effort is normal. No respiratory distress.     Breath sounds: No stridor. No wheezing, rhonchi or rales.     Comments: Decreased breath sounds on right. Chest:     Chest wall: No tenderness.  Abdominal:     General: Bowel sounds are normal. There is no distension.     Palpations: Abdomen is soft. There is no mass.     Tenderness: There is no abdominal tenderness. There is no guarding or rebound.  Musculoskeletal:        General: No swelling. Normal range of motion.     Cervical back: Normal range of motion and neck supple.     Right lower leg: No edema.     Left lower leg: No edema.     Comments: Tender lumbar spine.  Lymphadenopathy:     Cervical: No cervical adenopathy.     Upper Body:     Right upper body: No supraclavicular adenopathy.     Left upper body: No supraclavicular adenopathy.  Skin:    General: Skin is warm and dry.  Neurological:     Mental Status: He is alert and oriented to person, place, and time.  Psychiatric:        Mood and Affect: Mood normal.        Behavior: Behavior normal.        Thought Content: Thought  content normal.        Judgment: Judgment normal.    Orders Only on 07/24/2019  Component Date Value Ref Range Status  .  Bilirubin, Direct 07/24/2019 0.2  0.0 - 0.2 mg/dL Final   Performed at [facility], [address] [place], [place] [address]  Appointment on 07/24/2019  Component Date Value Ref Range Status  . WBC 07/24/2019 3.7* 4.0 - 10.5 K/uL Final  . RBC 07/24/2019 3.83* 4.22 - 5.81 MIL/uL Final  . Hemoglobin 07/24/2019 12.5* 13.0 - 17.0 g/dL Final  . HCT 07/24/2019 36.1* 39 - 52 % Final  . MCV 07/24/2019 94.3  80.0 - 100.0 fL Final  . MCH 07/24/2019 32.6  26.0 - 34.0 pg Final  . MCHC 07/24/2019 34.6  30.0 - 36.0 g/dL Final  . RDW 07/24/2019 12.3  11.5 - 15.5 % Final  . Platelets 07/24/2019 64* 150 - 400 K/uL Final   Comment: Immature Platelet Fraction may be clinically indicated, consider ordering this additional test [id number]   . nRBC 07/24/2019 0.0  0.0 - 0.2 % Final  . Neutrophils Relative % 07/24/2019 73  % Final  . Neutro Abs 07/24/2019 2.7  1.7 - 7.7 K/uL Final  . Lymphocytes Relative 07/24/2019 19  % Final  . Lymphs Abs 07/24/2019 0.7  0.7 - 4.0 K/uL Final  . Monocytes Relative 07/24/2019 8  % Final  . Monocytes Absolute 07/24/2019 0.3  0 - 1 K/uL Final  . Eosinophils Relative 07/24/2019 0  % Final  . Eosinophils Absolute 07/24/2019 0.0  0 - 0 K/uL Final  . Basophils Relative 07/24/2019 0  % Final  . Basophils Absolute 07/24/2019 0.0  0 - 0 K/uL Final  . Immature Granulocytes 07/24/2019 0  % Final  . Abs Immature Granulocytes 07/24/2019 0.01  0.00 - 0.07 K/uL Final   Performed at [facility], [address] [place], [place] [address]  . Sodium 07/24/2019 134* 135 - 145 mmol/L Final  . Potassium 07/24/2019 3.2* 3.5 - 5.1 mmol/L Final  . Chloride 07/24/2019 94* 98 - 111 mmol/L Final  . CO2 07/24/2019 29  22 - 32 mmol/L Final  . Glucose, Bld 07/24/2019 141* 70 - 99 mg/dL Final   Glucose reference range applies only to samples taken after fasting for at least 8 hours.  . BUN 07/24/2019 32* 8 - 23 mg/dL Final  . Creatinine, Ser 07/24/2019 1.17  0.61 - 1.24  mg/dL Final  . Calcium 07/24/2019 9.0  8.9 - 10.3 mg/dL Final  . Total Protein 07/24/2019 7.0  6.5 - 8.1 g/dL Final  . Albumin 07/24/2019 3.5  3.5 - 5.0 g/dL Final  . AST 07/24/2019 34  15 - 41 U/L Final  . ALT 07/24/2019 34  0 - 44 U/L Final  . Alkaline Phosphatase 07/24/2019 61  38 - 126 U/L Final  . Total Bilirubin 07/24/2019 1.3* 0.3 - 1.2 mg/dL Final  . GFR calc non Af Amer 07/24/2019 56* >60 mL/min Final  . GFR calc Af Amer 07/24/2019 >60  >60 mL/min Final  . Anion gap 07/24/2019 11  5 - 15 Final   Performed at [facility], [address] [place], [place] [address]    Assessment:  DINERO CHAVIRA is a 86 y.o. male with metastaticadenocarcinoma of thelungs/p right sided thoracentesis on 10/06/2018.Cytologyconfirmed non-small cell carcinoma, favor adenocarcinoma of the lung.He has a 25-30 pack year smoking history.He has clinical stageT4NxM1b.  PD-L1 testing was 80% on 11/09/2018. [facility] [employer]  revealednumber mutational burden (TMB)18 Muts/Mb, MSI-stable, ATM splice site 4536-4W>O, CDKN2A loss, CDKN2B loss, DNMT3A Y533C, FGF10 amplification, KRAS G12C, MTAP loss, and RBM10 A422f*216.  Chest CT angiogramon 10/05/2018 revealed a 8.4 x 6.9 cm right lower lobe mass with numerous pleural nodules and a large right pleural effusion. There was bony destruction of the right lateral 7th and 8th ribs. There was no pulmonary embolism.  Head MRIon 10/07/2018 revealed no metastatic disease or acute intracranial abnormality.There was distal left vertebral artery with poor flow or occlusion, most likely due to atherosclerosis.  PET scanon 10/18/2018 revealed a large hypermetabolic RIGHT lower lobe mass consistent with bronchogenic carcinoma. There was multifocal pleural metastasis within the RIGHT hemithorax. There was one pleural metastatic lesion invading adjacent RIGHT seventh rib, and moderate RIGHT effusion. There was probable RIGHT hilar metastatic  adenopathy, and no mediastinal or supraclavicular metastatic adenopathy.   He is s/pthoracentesisx 2 (10/06/2018 and10/05/2018).CT biopsyof the right lower lobelung masson 10/31/2018 was performed for mutational studies.  He is s/p cycle #1 Alimta and pembrolizumabon 11/03/2018.He iss/p8cycles ofpembrolizumabalone (11/24/2018-04/27/2019).  He is day 8 of cycle #1 gemcitabine (07/17/2019).  Chest CTon 02/12/2019 revealed interval improvement in the patient's malignancy. The primary mass was3.9 x 3.6 cm (previously 5.7 x 7.5 cm). The right pleural nodularityhadimproved insize andnumberof lesions(nodularity abutting right 5th rib was1.5 x 0.5 cm; previously 2.9 x 1.4 cm). There were sclerotic changes in multiple right-sided ribsc/wtreated metastatic disease. There was no right hilar adenopathy. The right pleural effusion was smaller. There was severe emphysematous changes in the lungs. There was no metastatic disease below the diaphragm.   Bone scanon 02/14/2019 revealed a linear bandlike uptake along the inferior aspect of the L3 vertebral body. This would not be typical for metastatic disease and may be related to compression fracture. Lumbar spine MRI could be used to further evaluate. There was radiotracer accumulation in the lateral right fifth and seventh ribsc/wknown metastatic disease as characterized on previous chest CT and PET-CT. There was no unexpected uptake in the bony pelvis.  Lumbar spine MRIon 02/15/2019 revealed amild inferior endplate compression fracture of L3with vertebral body height loss of up to approximately 30% has an appearance most c/wa senile osteoporotic injury.There was no evidencefor metastatic disease. There was partial visualization of a right pleural effusion. The patient had a right effusion on the prior chest CT.There was an approximately 3.6 cm abdominal aortic aneurysm(chronic).He underwent L3 kyphoplastyon  02/20/2019.  Chest CTon 05/15/2019 revealed slight interval decrease in size and fullness of a spiculated infrahilar mass of the right lower lobe, with extensive postobstructive consolidation of the right lung base and a small right pleural effusion. There was interval increase in size in multiple subpleural soft tissue nodules about the right lung c/w worsened pleural metastatic disease. There were unchanged sclerotic rib lesions.   He has a history of renal insufficiencyon 05/28/2019. Renal ultrasoundon 05/28/2019 showed bilateral simple renal cystswithout hydronephrosis.  SPEP on 07/02/2019 revealed a poorly defined band of restricted protein mobility in the gamma globulins.  He has memory loss issues.  Brain MRI on 07/08/2019 revealed no intracranial metastatic disease. There was multifocal white matter hyperintensity, most commonly due to chronic ischemic microangiopathy.  He received the influenza vaccineon 10/13/2018. He received his second COVID-19 vaccineon 04/19/2019.  Symptomatically, he has taken 4 falls during the interim.  He has new lower back pain.  Potassium is 3.2.  Bilirubin is 1.3.  Plan: 1.   Labs today:  CBC with diff, CMP, direct bilirubin. 2. Stage IV  adenocarcinoma of the lung Clinically, he is fatigued. Patient iss/p 1 cycle of Alimta and pembrolizumab. Patient is s/p8cycles ofpembrolizumab alone(last04/02/2019). Chest CT on 04/02/2021revealeda mixed response with slight decrease in the right infrahilar mass and increase in subpleural nodules in the right lung. Pembrolizumabwas discontinued (last dose on 04/27/2019). He has been unable to receive Alimta secondary to CrCl < 45 ml/min.             He is day 8 of cycle #1 gemcitabine (07/17/2019).             Performance status has declined since treatment .  Discuss holding treatment today.  Discuss concern for future ability  to proceed with treatment. 3. Renal insufficiency Creatinine 1.17 (CrCl 36.4 ml/min) today. Baseline creatinine 0.82 - 1.01. Renal ultrasound revealed no obstruction.  Continue to encourage fluids. 4.Bone metastasis Patienthasright lower ribmetastasis seen on chest CT. Bone scan on 02/14/2019 revealed only lateral right 5th and 7th ribs c/w metastatic disease. Xgeva on hold secondary to renal status. 5.Cancer-related pain Cancer related pain appears controlled. 6. Hypokalemia Potassium3.2today. IVF NS + 20 meq KCL/liter today. 7. Back pain  Patient developed acute back pain after fall. He has a history of compression fracture s/p kyphoplasty.  Lumabr spine films today.  Lumbar spine MRI.  Contact orthopedics if patient has a new compression fracture.  Rx:  Oxycodone 5 mg po q 4 hours prn pain.  8.   Code status  Discuss current health and difficulty receiving treatment.  Discuss code status.  Patient does not wish heroic measures.  MOST form provided.  Patient agrees to a DNR/DNI code status. 9.   CXR (PA and lateral) today. 10.   RTC in 1 week for MD assessment and labs (CBC with diff, CMP).  Addendum:  CXR today reveals improved aeration in the right lung base.  Lumbar films reveal a new L1 compression fracture.  I discussed the assessment and treatment plan with the patient.  The patient was provided an opportunity to ask questions and all were answered.  The patient agreed with the plan and demonstrated an understanding of the instructions.  The patient was advised to call back if the symptoms worsen or if the condition fails to improve as anticipated.  I provided 19 minutes of face-to-face time during this this encounter and > 50% was spent counseling as documented under my assessment and plan. An additional 10-3mnutes  were spent reviewing his chart (Epic and Care Everywhere) including notes, labs, and imaging studies.    MLequita Asal MD, PhD    07/24/2019, 10:12 AM  I, EDe Burrs am acting as scribe for [employer] CMike Gip MD, PhD.  I, Ragen Laver C. CMike Gip MD, have reviewed the above documentation for accuracy and completeness, and I agree with the above.

## 2019-07-24 ENCOUNTER — Inpatient Hospital Stay (HOSPITAL_BASED_OUTPATIENT_CLINIC_OR_DEPARTMENT_OTHER): Payer: Medicare HMO | Admitting: Hematology and Oncology

## 2019-07-24 ENCOUNTER — Inpatient Hospital Stay: Payer: Medicare HMO

## 2019-07-24 ENCOUNTER — Other Ambulatory Visit: Payer: Self-pay

## 2019-07-24 ENCOUNTER — Telehealth: Payer: Self-pay | Admitting: Hematology and Oncology

## 2019-07-24 ENCOUNTER — Telehealth: Payer: Self-pay | Admitting: Oncology

## 2019-07-24 ENCOUNTER — Other Ambulatory Visit: Payer: Self-pay | Admitting: Oncology

## 2019-07-24 ENCOUNTER — Ambulatory Visit
Admission: RE | Admit: 2019-07-24 | Discharge: 2019-07-24 | Disposition: A | Discharge status: Home / Self Care | Payer: Medicare HMO | Attending: Hematology and Oncology | Admitting: Hematology and Oncology

## 2019-07-24 ENCOUNTER — Ambulatory Visit
Admission: RE | Admit: 2019-07-24 | Discharge: 2019-07-24 | Disposition: A | Discharge status: Home / Self Care | Payer: Medicare HMO | Source: Ambulatory Visit | Attending: Hematology and Oncology | Admitting: Hematology and Oncology

## 2019-07-24 ENCOUNTER — Encounter: Payer: Self-pay | Admitting: Hematology and Oncology

## 2019-07-24 VITALS — BP 111/65 | HR 83 | Temp 99.1°F | Resp 18 | Ht 66.0 in | Wt 125.3 lb

## 2019-07-24 DIAGNOSIS — E860 Dehydration: Secondary | ICD-10-CM

## 2019-07-24 DIAGNOSIS — Z7189 Other specified counseling: Secondary | ICD-10-CM

## 2019-07-24 DIAGNOSIS — C3431 Malignant neoplasm of lower lobe, right bronchus or lung: Secondary | ICD-10-CM | POA: Insufficient documentation

## 2019-07-24 DIAGNOSIS — M545 Low back pain, unspecified: Secondary | ICD-10-CM

## 2019-07-24 DIAGNOSIS — Z5111 Encounter for antineoplastic chemotherapy: Secondary | ICD-10-CM | POA: Diagnosis not present

## 2019-07-24 DIAGNOSIS — R17 Unspecified jaundice: Secondary | ICD-10-CM

## 2019-07-24 DIAGNOSIS — G893 Neoplasm related pain (acute) (chronic): Secondary | ICD-10-CM | POA: Diagnosis not present

## 2019-07-24 DIAGNOSIS — C7951 Secondary malignant neoplasm of bone: Secondary | ICD-10-CM | POA: Diagnosis not present

## 2019-07-24 DIAGNOSIS — E876 Hypokalemia: Secondary | ICD-10-CM

## 2019-07-24 DIAGNOSIS — M25559 Pain in unspecified hip: Secondary | ICD-10-CM

## 2019-07-24 DIAGNOSIS — R634 Abnormal weight loss: Secondary | ICD-10-CM

## 2019-07-24 DIAGNOSIS — N289 Disorder of kidney and ureter, unspecified: Secondary | ICD-10-CM

## 2019-07-24 LAB — CBC WITH DIFFERENTIAL/PLATELET
Abs Immature Granulocytes: 0.01 10*3/uL (ref 0.00–0.07)
Basophils Absolute: 0 10*3/uL (ref 0.0–0.1)
Basophils Relative: 0 %
Eosinophils Absolute: 0 10*3/uL (ref 0.0–0.5)
Eosinophils Relative: 0 %
HCT: 36.1 % — ABNORMAL LOW (ref 39.0–52.0)
Hemoglobin: 12.5 g/dL — ABNORMAL LOW (ref 13.0–17.0)
Immature Granulocytes: 0 %
Lymphocytes Relative: 19 %
Lymphs Abs: 0.7 10*3/uL (ref 0.7–4.0)
MCH: 32.6 pg (ref 26.0–34.0)
MCHC: 34.6 g/dL (ref 30.0–36.0)
MCV: 94.3 fL (ref 80.0–100.0)
Monocytes Absolute: 0.3 10*3/uL (ref 0.1–1.0)
Monocytes Relative: 8 %
Neutro Abs: 2.7 10*3/uL (ref 1.7–7.7)
Neutrophils Relative %: 73 %
Platelets: 64 10*3/uL — ABNORMAL LOW (ref 150–400)
RBC: 3.83 MIL/uL — ABNORMAL LOW (ref 4.22–5.81)
RDW: 12.3 % (ref 11.5–15.5)
WBC: 3.7 10*3/uL — ABNORMAL LOW (ref 4.0–10.5)
nRBC: 0 % (ref 0.0–0.2)

## 2019-07-24 LAB — COMPREHENSIVE METABOLIC PANEL
ALT: 34 U/L (ref 0–44)
AST: 34 U/L (ref 15–41)
Albumin: 3.5 g/dL (ref 3.5–5.0)
Alkaline Phosphatase: 61 U/L (ref 38–126)
Anion gap: 11 (ref 5–15)
BUN: 32 mg/dL — ABNORMAL HIGH (ref 8–23)
CO2: 29 mmol/L (ref 22–32)
Calcium: 9 mg/dL (ref 8.9–10.3)
Chloride: 94 mmol/L — ABNORMAL LOW (ref 98–111)
Creatinine, Ser: 1.17 mg/dL (ref 0.61–1.24)
GFR calc Af Amer: >60 mL/min (ref >60)
GFR calc non Af Amer: 56 mL/min — ABNORMAL LOW (ref >60)
Glucose, Bld: 141 mg/dL — ABNORMAL HIGH (ref 70–99)
Potassium: 3.2 mmol/L — ABNORMAL LOW (ref 3.5–5.1)
Sodium: 134 mmol/L — ABNORMAL LOW (ref 135–145)
Total Bilirubin: 1.3 mg/dL — ABNORMAL HIGH (ref 0.3–1.2)
Total Protein: 7 g/dL (ref 6.5–8.1)

## 2019-07-24 LAB — BILIRUBIN, DIRECT: Bilirubin, Direct: 0.2 mg/dL (ref 0.0–0.2)

## 2019-07-24 MED ORDER — SODIUM CHLORIDE 0.9 % IV SOLN
Freq: Once | INTRAVENOUS | Status: AC
  Filled 2019-07-24: qty 10

## 2019-07-24 MED ORDER — OXYCODONE HCL 5 MG PO TABS: ORAL_TABLET | ORAL | 0 refills | Status: DC

## 2019-07-24 MED ORDER — HEPARIN SOD (PORK) LOCK FLUSH 100 UNIT/ML IV SOLN
500.0000 [IU] | Freq: Once | INTRAVENOUS | Status: AC
  Administered 2019-07-24: 500 [IU] via INTRAVENOUS
  Filled 2019-07-24: qty 5

## 2019-07-24 NOTE — Telephone Encounter (Signed)
I called [name] (wife) and informed her that Damario's MRI Lumbar Spine  Was changed to STAT and the appt is 6/30 @ 4 pm, arrive @ 330 pm [facility] [place]. His appt with Dr Rudene Christians [facility] is Friday 7/2 @ 930 am.

## 2019-07-24 NOTE — Progress Notes (Signed)
The patient wife reports the patient had 3- falls 3 days ago. She reports she had to call the EMS to get him up out the floor. The patient reports lower back pain today ( 10) at his worst he has every felt before.

## 2019-07-24 NOTE — Telephone Encounter (Signed)
Re: L1 compression fracture  Mr. Halter was seen in clinic this morning for assessment prior to next cycle Gemcitabine. Admits to frequent falls at home. Imaging reveals new L1 compression fracture. Have reached out to Rachelle Hora, [place] at [facility] ortho for an appt ASAP.   Spoke to patient and wife and gave them results.   Faythe Casa, NP 07/24/2019 2:45 PM

## 2019-07-24 NOTE — Progress Notes (Signed)
Spoke to patient and wife regarding results of imaging.  Shirlean Mylar is scheduling appt with Roma Schanz, PA.   Sent message to Rachelle Hora, [place], NP 07/24/2019 2:55 PM

## 2019-07-25 ENCOUNTER — Ambulatory Visit
Admission: RE | Admit: 2019-07-25 | Discharge: 2019-07-25 | Disposition: A | Discharge status: Home / Self Care | Payer: Medicare HMO | Source: Ambulatory Visit | Attending: Oncology | Admitting: Oncology

## 2019-07-25 DIAGNOSIS — M545 Low back pain, unspecified: Secondary | ICD-10-CM

## 2019-07-26 NOTE — Progress Notes (Signed)
MRI results. He is seeing [facility] tomorrow per Shirlean Mylar.   Faythe Casa, NP 07/26/2019 9:09 AM

## 2019-07-27 ENCOUNTER — Other Ambulatory Visit
Admission: RE | Admit: 2019-07-27 | Discharge: 2019-07-27 | Disposition: A | Discharge status: Home / Self Care | Payer: Medicare HMO | Source: Ambulatory Visit | Attending: Orthopedic Surgery | Admitting: Orthopedic Surgery

## 2019-07-27 ENCOUNTER — Other Ambulatory Visit: Payer: Self-pay

## 2019-07-27 ENCOUNTER — Other Ambulatory Visit: Payer: Self-pay | Admitting: Orthopedic Surgery

## 2019-07-27 ENCOUNTER — Inpatient Hospital Stay: Payer: Medicare HMO | Attending: Hematology and Oncology | Admitting: Hospice and Palliative Medicine

## 2019-07-27 DIAGNOSIS — Z01812 Encounter for preprocedural laboratory examination: Secondary | ICD-10-CM | POA: Diagnosis present

## 2019-07-27 DIAGNOSIS — C3431 Malignant neoplasm of lower lobe, right bronchus or lung: Secondary | ICD-10-CM | POA: Diagnosis not present

## 2019-07-27 DIAGNOSIS — Z515 Encounter for palliative care: Secondary | ICD-10-CM

## 2019-07-27 DIAGNOSIS — Z20822 Contact with and (suspected) exposure to covid-19: Secondary | ICD-10-CM | POA: Insufficient documentation

## 2019-07-27 LAB — SARS CORONAVIRUS 2 (TAT 6-24 HRS): SARS Coronavirus 2: NEGATIVE

## 2019-07-27 NOTE — Progress Notes (Signed)
Virtual Visit via Telephone Note  I connected with Andres Hebert on 07/27/19 at  1:00 PM EDT by telephone and verified that I am speaking with the correct person using two identifiers.   I discussed the limitations, risks, security and privacy concerns of performing an evaluation and management service by telephone and the availability of in person appointments. I also discussed with the patient that there may be a patient responsible charge related to this service. The patient expressed understanding and agreed to proceed.   History of Present Illness: Mr. Andres Hebert is an 86 y.o. male with multiple medical problems including mild cognitive impairment and stage IV adenocarcinoma of the lung with h/o malignant pleural effusion on treatment with Keytruda.  Palliative care was consulted to help address goals and manage ongoing symptoms.   Observations/Objective: Virtual visit attempted.  I spoke with the patient's wife by phone.    Wife reports that patient has declined recently.  He has had multiple falls over the past couple weeks.  He is now mostly chair/bedbound.  Oral intake has also declined.  Wife spoke with Dr. Mike Hebert and patient is felt no longer to be a viable candidate for systemic treatment.  Discussed option of home health involvement for PT/OT wife does not think that he will be able to participate or benefit from their services.  We discussed the alternative option of hospice and wife thought that that would be more appropriate at this stage.  She discussed feeling like patient is likely nearing end-of-life.  She wants to support him best she can at home.  Discussed with Dr. Mike Hebert who also agreed with hospice care at home.  I called the referral center and gave him an order for hospice. Assessment and Plan: Stage IV non-small cell lung cancer -best supportive care.  Overall declining.  Will refer to hospice  Follow Up Instructions: Follow-up virtual visit as needed    I discussed the assessment and treatment plan with the patient. The patient was provided an opportunity to ask questions and all were answered. The patient agreed with the plan and demonstrated an understanding of the instructions.   The patient was advised to call back or seek an in-person evaluation if the symptoms worsen or if the condition fails to improve as anticipated.  I provided 15 minutes of non-face-to-face time during this encounter.   Irean Hong, NP

## 2019-07-30 NOTE — Patient Instructions (Signed)
+   20 meq KLC/liter today.

## 2019-07-31 ENCOUNTER — Other Ambulatory Visit: Payer: Self-pay

## 2019-07-31 ENCOUNTER — Ambulatory Visit
Admission: RE | Admit: 2019-07-31 | Discharge: 2019-07-31 | Disposition: A | Discharge status: Home / Self Care | Payer: Medicare Other | Attending: Orthopedic Surgery | Admitting: Orthopedic Surgery

## 2019-07-31 ENCOUNTER — Ambulatory Visit: Payer: Medicare Other | Admitting: Certified Registered"

## 2019-07-31 ENCOUNTER — Encounter
Admission: RE | Disposition: A | Discharge status: Home / Self Care | Payer: Self-pay | Source: Home / Self Care | Attending: Orthopedic Surgery

## 2019-07-31 ENCOUNTER — Ambulatory Visit: Payer: Medicare Other

## 2019-07-31 DIAGNOSIS — Z7984 Long term (current) use of oral hypoglycemic drugs: Secondary | ICD-10-CM | POA: Diagnosis not present

## 2019-07-31 DIAGNOSIS — S32010A Wedge compression fracture of first lumbar vertebra, initial encounter for closed fracture: Secondary | ICD-10-CM | POA: Insufficient documentation

## 2019-07-31 DIAGNOSIS — Z79899 Other long term (current) drug therapy: Secondary | ICD-10-CM | POA: Diagnosis not present

## 2019-07-31 DIAGNOSIS — W19XXXA Unspecified fall, initial encounter: Secondary | ICD-10-CM | POA: Diagnosis not present

## 2019-07-31 DIAGNOSIS — G893 Neoplasm related pain (acute) (chronic): Secondary | ICD-10-CM | POA: Insufficient documentation

## 2019-07-31 DIAGNOSIS — M25559 Pain in unspecified hip: Secondary | ICD-10-CM

## 2019-07-31 DIAGNOSIS — K219 Gastro-esophageal reflux disease without esophagitis: Secondary | ICD-10-CM | POA: Diagnosis not present

## 2019-07-31 DIAGNOSIS — E119 Type 2 diabetes mellitus without complications: Secondary | ICD-10-CM | POA: Diagnosis not present

## 2019-07-31 DIAGNOSIS — E785 Hyperlipidemia, unspecified: Secondary | ICD-10-CM | POA: Insufficient documentation

## 2019-07-31 DIAGNOSIS — I10 Essential (primary) hypertension: Secondary | ICD-10-CM | POA: Insufficient documentation

## 2019-07-31 DIAGNOSIS — Z7982 Long term (current) use of aspirin: Secondary | ICD-10-CM | POA: Diagnosis not present

## 2019-07-31 DIAGNOSIS — I213 ST elevation (STEMI) myocardial infarction of unspecified site: Secondary | ICD-10-CM | POA: Insufficient documentation

## 2019-07-31 DIAGNOSIS — C7951 Secondary malignant neoplasm of bone: Secondary | ICD-10-CM | POA: Diagnosis not present

## 2019-07-31 DIAGNOSIS — Z87891 Personal history of nicotine dependence: Secondary | ICD-10-CM | POA: Diagnosis not present

## 2019-07-31 DIAGNOSIS — C3490 Malignant neoplasm of unspecified part of unspecified bronchus or lung: Secondary | ICD-10-CM | POA: Insufficient documentation

## 2019-07-31 DIAGNOSIS — M545 Low back pain, unspecified: Secondary | ICD-10-CM

## 2019-07-31 DIAGNOSIS — Z419 Encounter for procedure for purposes other than remedying health state, unspecified: Secondary | ICD-10-CM

## 2019-07-31 HISTORY — PX: KYPHOPLASTY: SHX5884

## 2019-07-31 LAB — GLUCOSE, CAPILLARY
Glucose-Capillary: 108 mg/dL — ABNORMAL HIGH (ref 70–99)
Glucose-Capillary: 133 mg/dL — ABNORMAL HIGH (ref 70–99)

## 2019-07-31 SURGERY — KYPHOPLASTY
Anesthesia: General | Site: Spine Lumbar

## 2019-07-31 MED ORDER — MIDAZOLAM HCL 2 MG/2ML IJ SOLN
INTRAMUSCULAR | Status: DC | PRN
  Administered 2019-07-31 (×3): .5 mg via INTRAVENOUS

## 2019-07-31 MED ORDER — KETAMINE HCL 50 MG/ML IJ SOLN
INTRAMUSCULAR | Status: AC
  Filled 2019-07-31: qty 10

## 2019-07-31 MED ORDER — CHLORHEXIDINE GLUCONATE 0.12 % MT SOLN: 15.0000 mL | Freq: Once | OROMUCOSAL | Status: DC

## 2019-07-31 MED ORDER — IOHEXOL 180 MG/ML  SOLN
INTRAMUSCULAR | Status: DC | PRN
  Administered 2019-07-31: 20 mL

## 2019-07-31 MED ORDER — ONDANSETRON HCL 4 MG/2ML IJ SOLN
INTRAMUSCULAR | Status: DC | PRN
  Administered 2019-07-31: 4 mg via INTRAVENOUS

## 2019-07-31 MED ORDER — ACETAMINOPHEN 10 MG/ML IV SOLN
INTRAVENOUS | Status: AC
  Filled 2019-07-31: qty 100

## 2019-07-31 MED ORDER — DEXAMETHASONE SODIUM PHOSPHATE 10 MG/ML IJ SOLN
INTRAMUSCULAR | Status: DC | PRN
  Administered 2019-07-31: 10 mg via INTRAVENOUS

## 2019-07-31 MED ORDER — BUPIVACAINE-EPINEPHRINE (PF) 0.5% -1:200000 IJ SOLN
INTRAMUSCULAR | Status: AC
  Filled 2019-07-31: qty 30

## 2019-07-31 MED ORDER — LIDOCAINE HCL 1 % IJ SOLN
INTRAMUSCULAR | Status: DC | PRN
  Administered 2019-07-31: 10 mL
  Administered 2019-07-31: 20 mL

## 2019-07-31 MED ORDER — SODIUM CHLORIDE 0.9 % IV SOLN: INTRAVENOUS | Status: DC

## 2019-07-31 MED ORDER — KETAMINE HCL 50 MG/ML IJ SOLN
INTRAMUSCULAR | Status: DC | PRN
  Administered 2019-07-31 (×4): 5 mg via INTRAMUSCULAR

## 2019-07-31 MED ORDER — OXYCODONE HCL 5 MG PO TABS: 5.0000 mg | ORAL_TABLET | ORAL | 0 refills | Status: DC | PRN

## 2019-07-31 MED ORDER — LIDOCAINE HCL (PF) 1 % IJ SOLN
INTRAMUSCULAR | Status: AC
  Filled 2019-07-31: qty 30

## 2019-07-31 MED ORDER — CHLORHEXIDINE GLUCONATE 0.12 % MT SOLN
OROMUCOSAL | Status: AC
  Filled 2019-07-31: qty 15

## 2019-07-31 MED ORDER — FENTANYL CITRATE (PF) 100 MCG/2ML IJ SOLN
INTRAMUSCULAR | Status: AC
  Filled 2019-07-31: qty 2

## 2019-07-31 MED ORDER — GLYCOPYRROLATE 0.2 MG/ML IJ SOLN
INTRAMUSCULAR | Status: DC | PRN
  Administered 2019-07-31: .1 mg via INTRAVENOUS
  Administered 2019-07-31: 1 mg via INTRAVENOUS

## 2019-07-31 MED ORDER — ACETAMINOPHEN 325 MG PO TABS: 325.0000 mg | ORAL_TABLET | Freq: Four times a day (QID) | ORAL | Status: DC | PRN

## 2019-07-31 MED ORDER — FENTANYL CITRATE (PF) 100 MCG/2ML IJ SOLN: 25.0000 ug | INTRAMUSCULAR | Status: DC | PRN

## 2019-07-31 MED ORDER — METOCLOPRAMIDE HCL 5 MG/ML IJ SOLN: 5.0000 mg | Freq: Three times a day (TID) | INTRAMUSCULAR | Status: DC | PRN

## 2019-07-31 MED ORDER — HYDROCODONE-ACETAMINOPHEN 5-325 MG PO TABS: 1.0000 | ORAL_TABLET | ORAL | Status: DC | PRN

## 2019-07-31 MED ORDER — CEFAZOLIN SODIUM-DEXTROSE 2-4 GM/100ML-% IV SOLN
INTRAVENOUS | Status: AC
  Filled 2019-07-31: qty 100

## 2019-07-31 MED ORDER — MORPHINE SULFATE (PF) 2 MG/ML IV SOLN: 0.5000 mg | INTRAVENOUS | Status: DC | PRN

## 2019-07-31 MED ORDER — CEFAZOLIN SODIUM-DEXTROSE 2-4 GM/100ML-% IV SOLN
2.0000 g | INTRAVENOUS | Status: AC
  Administered 2019-07-31: 2 g via INTRAVENOUS

## 2019-07-31 MED ORDER — ONDANSETRON HCL 4 MG/2ML IJ SOLN: 4.0000 mg | Freq: Once | INTRAMUSCULAR | Status: DC | PRN

## 2019-07-31 MED ORDER — ONDANSETRON HCL 4 MG/2ML IJ SOLN: 4.0000 mg | Freq: Four times a day (QID) | INTRAMUSCULAR | Status: DC | PRN

## 2019-07-31 MED ORDER — MIDAZOLAM HCL 2 MG/2ML IJ SOLN
INTRAMUSCULAR | Status: AC
  Filled 2019-07-31: qty 2

## 2019-07-31 MED ORDER — ONDANSETRON HCL 4 MG PO TABS: 4.0000 mg | ORAL_TABLET | Freq: Four times a day (QID) | ORAL | Status: DC | PRN

## 2019-07-31 MED ORDER — PROPOFOL 500 MG/50ML IV EMUL
INTRAVENOUS | Status: DC | PRN
  Administered 2019-07-31: 75 ug/kg/min via INTRAVENOUS

## 2019-07-31 MED ORDER — BUPIVACAINE-EPINEPHRINE (PF) 0.5% -1:200000 IJ SOLN
INTRAMUSCULAR | Status: DC | PRN
  Administered 2019-07-31: 20 mL via PERINEURAL

## 2019-07-31 MED ORDER — ACETAMINOPHEN 10 MG/ML IV SOLN
INTRAVENOUS | Status: DC | PRN
  Administered 2019-07-31: 1000 mg via INTRAVENOUS

## 2019-07-31 MED ORDER — METOCLOPRAMIDE HCL 10 MG PO TABS: 5.0000 mg | ORAL_TABLET | Freq: Three times a day (TID) | ORAL | Status: DC | PRN

## 2019-07-31 MED ORDER — HYDROCODONE-ACETAMINOPHEN 7.5-325 MG PO TABS
1.0000 | ORAL_TABLET | ORAL | Status: DC | PRN
  Filled 2019-07-31: qty 2

## 2019-07-31 MED ORDER — ACETAMINOPHEN 500 MG PO TABS: 500.0000 mg | ORAL_TABLET | Freq: Four times a day (QID) | ORAL | Status: DC

## 2019-07-31 MED ORDER — ESMOLOL HCL 100 MG/10ML IV SOLN
INTRAVENOUS | Status: DC | PRN
  Administered 2019-07-31: 10 mg via INTRAVENOUS

## 2019-07-31 MED ORDER — ORAL CARE MOUTH RINSE: 15.0000 mL | Freq: Once | OROMUCOSAL | Status: DC

## 2019-07-31 SURGICAL SUPPLY — 20 items
CEMENT KYPHON CX01A KIT/MIXER (Cement) ×3 IMPLANT
COVER WAND RF STERILE (DRAPES) ×3 IMPLANT
DERMABOND ADVANCED (GAUZE/BANDAGES/DRESSINGS) ×2
DERMABOND ADVANCED .7 DNX12 (GAUZE/BANDAGES/DRESSINGS) ×1 IMPLANT
DEVICE BIOPSY BONE KYPHX (INSTRUMENTS) ×3 IMPLANT
DRAPE C-ARM XRAY 36X54 (DRAPES) ×3 IMPLANT
DURAPREP 26ML APPLICATOR (WOUND CARE) ×3 IMPLANT
GLOVE SURG SYN 9.0  PF PI (GLOVE) ×2
GLOVE SURG SYN 9.0 PF PI (GLOVE) ×1 IMPLANT
GOWN SRG 2XL LVL 4 RGLN SLV (GOWNS) ×1 IMPLANT
GOWN STRL NON-REIN 2XL LVL4 (GOWNS) ×2
GOWN STRL REUS W/ TWL LRG LVL3 (GOWN DISPOSABLE) ×1 IMPLANT
GOWN STRL REUS W/TWL LRG LVL3 (GOWN DISPOSABLE) ×2
PACK KYPHOPLASTY (MISCELLANEOUS) ×3 IMPLANT
RENTAL RFA  GENERATOR (MISCELLANEOUS)
RENTAL RFA GENERATOR (MISCELLANEOUS) IMPLANT
STRAP SAFETY 5IN WIDE (MISCELLANEOUS) ×3 IMPLANT
SWABSTK COMLB BENZOIN TINCTURE (MISCELLANEOUS) ×3 IMPLANT
TRAY KYPHOPAK 15/3 EXPRESS 1ST (MISCELLANEOUS) IMPLANT
TRAY KYPHOPAK 20/3 EXPRESS 1ST (MISCELLANEOUS) ×3 IMPLANT

## 2019-07-31 NOTE — Anesthesia Preprocedure Evaluation (Addendum)
Anesthesia Evaluation  Patient identified by MRN, date of birth, ID band Patient confused    Reviewed: Allergy & Precautions, NPO status , Patient's Chart, lab work & pertinent test results  History of Anesthesia Complications Negative for: history of anesthetic complications  Airway Mallampati: III       Dental   Pulmonary neg sleep apnea, COPD,  COPD inhaler, Not current smoker, former smoker,           Cardiovascular hypertension, Pt. on medications      Neuro/Psych Mild confusion   GI/Hepatic Neg liver ROS, hiatal hernia, GERD  Medicated,  Endo/Other  diabetes, Type 2, Oral Hypoglycemic Agents  Renal/GU Renal InsufficiencyRenal disease     Musculoskeletal   Abdominal   Peds  Hematology   Anesthesia Other Findings   Reproductive/Obstetrics                            Anesthesia Physical Anesthesia Plan  ASA: III  Anesthesia Plan: General   Post-op Pain Management:    Induction: Intravenous  PONV Risk Score and Plan:   Airway Management Planned: Nasal Cannula  Additional Equipment:   Intra-op Plan:   Post-operative Plan:   Informed Consent: I have reviewed the patients History and Physical, chart, labs and discussed the procedure including the risks, benefits and alternatives for the proposed anesthesia with the patient or authorized representative who has indicated his/her understanding and acceptance.       Plan Discussed with:   Anesthesia Plan Comments:         Anesthesia Quick Evaluation

## 2019-07-31 NOTE — Transfer of Care (Signed)
Immediate Anesthesia Transfer of Care Note  Patient: Andres Hebert  Procedure(s) Performed: L1 KYPHOPLASTY (N/A Spine Lumbar)  Patient Location: PACU  Anesthesia Type:General  Level of Consciousness: drowsy, patient cooperative and responds to stimulation  Airway & Oxygen Therapy: Patient Spontanous Breathing and Patient connected to face mask oxygen  Post-op Assessment: Report given to RN and Post -op Vital signs reviewed and stable  Post vital signs: Reviewed and stable  Last Vitals:  Vitals Value Taken Time  BP 88/50 07/31/19 1339  Temp 36.4 C 07/31/19 1322  Pulse 84 07/31/19 1350  Resp 13 07/31/19 1350  SpO2 100 % 07/31/19 1350  Vitals shown include unvalidated device data.  Last Pain:  Vitals:   07/31/19 1322  TempSrc:   PainSc: Asleep         Complications: No complications documented.

## 2019-07-31 NOTE — Discharge Instructions (Addendum)
Take it easy today and tomorrow. Walk as much as you can but avoid bending or lifting for 2 weeks. Remove Band-Aids on Thursday then okay to shower. Pain medicine as directed. Call office if you are having problems.   AMBULATORY SURGERY  DISCHARGE INSTRUCTIONS   1) The drugs that you were given will stay in your system until tomorrow so for the next 24 hours you should not:  A) Drive an automobile B) Make any legal decisions C) Drink any alcoholic beverage   2) You may resume regular meals tomorrow.  Today it is better to start with liquids and gradually work up to solid foods.  You may eat anything you prefer, but it is better to start with liquids, then soup and crackers, and gradually work up to solid foods.   3) Please notify your doctor immediately if you have any unusual bleeding, trouble breathing, redness and pain at the surgery site, drainage, fever, or pain not relieved by medication.    4) Additional Instructions:        Please contact your physician with any problems or Same Day Surgery at [phone], Monday through Friday 6 am to 4 pm, or [place] at [facility] number at [phone].

## 2019-07-31 NOTE — H&P (Signed)
Chief Complaint  Patient presents with  . Establish Care  Acute L1 Fracture, prev L3 Kypho 02/20/19   History of the Present Illness: Andres Hebert is a 86 y.o. male here for evaluation of lung adenocarcinoma. He recently has had multiple falls at home and recently had an MRI at [facility] 2 days ago on 07/25/2018. He has an L1 compression fracture that is new. I performed a prior L3 compression fracture kyphoplasty on 02/20/2019. On his MRI, there is no evidence of this being a pathologic fracture. He is in severe pain.  The patient states he has been falling a lot. He states his pain radiates around to the side. The patient presents with a male companion who states he has been lying in the bed to relieve pain.   The patient takes baby aspirin. He has lung adenocarcinoma.   I have reviewed past medical, surgical, social and family history, and allergies as documented in the EMR.  Past Medical History: Past Medical History:  Diagnosis Date  . Allergic state  . Collapsed lung 1959  right side  . Diabetes mellitus type 2, uncomplicated (CMS-HCC)  . Hiatal hernia  . History of cancer 10/2018  lung cancer right lung  . Hyperlipidemia  . Hypertension  . Jaundice  as a child  . Migraines   Past Surgical History: Past Surgical History:  Procedure Laterality Date  . Bilateral cataract surgery  . COLONOSCOPY  w/ polypectomy 11 years ago  . EGD  11 years ago  . Pneumothorax involving lung 1959   Past Family History: Family History  Problem Relation Age of Onset  . Stroke Mother 78  . Epilepsy Mother  . High blood pressure (Hypertension) Father  . Heart disease Other  . Stroke Other  . Breast cancer Other   Medications: Current Outpatient Medications Ordered in Epic  Medication Sig Dispense Refill  . acetaminophen (TYLENOL) 325 MG tablet Take 650 mg by mouth every 6 (six) hours as needed for Pain  . amLODIPine (NORVASC) 5 MG tablet TAKE 1 TABLET (5 MG TOTAL) BY MOUTH ONCE  DAILY **NEEDS APPOINTMENT** 90 tablet 0  . aspirin 81 MG EC tablet Take 81 mg by mouth once daily.  . bisacodyL (DULCOLAX) 5 mg EC tablet Take 1 tablet by mouth once daily  . citalopram (CELEXA) 10 MG tablet TAKE 1 TABLET BY MOUTH EVERY DAY 90 tablet 3  . diclofenac (VOLTAREN) 1 % topical gel Apply 2 g topically 4 (four) times daily 810 g 1  . folic acid (FOLVITE) 1 MG tablet Take 1 tablet by mouth once daily  . food supplemt, lactose-reduced (ENSURE ENLIVE) 0.08 gram-1.5 kcal/mL Liqd Take 237 mLs by mouth 3 (three) times daily  . lidocaine-prilocaine (EMLA) cream Apply topically as needed  . ondansetron (ZOFRAN) 8 MG tablet Take 8 mg by mouth 2 (two) times daily  . oxyCODONE (ROXICODONE) 5 MG immediate release tablet Take 1 tablet by mouth every 4 (four) hours as needed  . polyethylene glycol (MIRALAX) powder Take 17 g by mouth once daily  . potassium chloride (KLOR-CON) 10 MEQ ER tablet TAKE 1 TABLET (10 MEQ TOTAL) BY MOUTH 2 (TWO) TIMES DAILY 180 tablet 3  . pravastatin (PRAVACHOL) 40 MG tablet TAKE 1 TABLET (40 MG TOTAL) BY MOUTH ONCE DAILY **NEEDS APPOINTMENT** 90 tablet 3  . sennosides (SENOKOT) 8.6 mg tablet Take 1 tablet by mouth once daily  . traZODone (DESYREL) 50 MG tablet TAKE 2 TABLETS BY MOUTH NIGHTLY 180 tablet 0  .  triamterene-hydrochlorothiazide (MAXZIDE-25) 37.5-25 mg tablet TAKE 1 TABLET BY MOUTH EVERY DAY 90 tablet 3   No current Epic-ordered facility-administered medications on file.   Allergies: Allergies  Allergen Reactions  . Accupril [Quinapril] Dizziness    Body mass index is 20.18 kg/m.  Review of Systems: A comprehensive 14 point ROS was performed, reviewed, and the pertinent orthopaedic findings are documented in the HPI.  Vitals:  07/27/19 0922  BP: 104/62   General Physical Examination:  General/Constitutional: No apparent distress: well-nourished and well developed. Eyes: Pupils equal, round with synchronous movement. Lungs: Clear to  auscultation HEENT: Normal Vascular: No edema, swelling or tenderness, except as noted in detailed exam. Cardiac: Heart rate and rhythm is regular. Integumentary: No impressive skin lesions present, except as noted in detailed exam. Neuro/Psych: Normal mood and affect, oriented to person, place and time.  Musculoskeletal Examination: On exam, the patient is tender to percussion over L1. Prominence at L1. Lungs are clear. Heart rate and rhythm is normal. HEENT is normal. Upper and lower dentures.  Radiographs: No new imaging studies were obtained or reviewed today.  Assessment: ICD-10-CM  1. Closed wedge compression fracture of L1 vertebra, initial encounter (CMS-HCC) S32.010A   Plan: The patient has clinical findings of a new L1 compression fracture.  We discussed the patient's prior x-ray and MRI findings. I explained the L1 compression fracture is new. I also assured him that there is no cancer in the bone. We reviewed the kyphoplasty procedure.  We will schedule the patient for L1 kyphoplasty on 07/31/2019.  Surgical Risks:  The nature of the condition and the proposed procedure has been reviewed in detail with the patient. Surgical versus non-surgical options and prognosis for recovery have been reviewed and the inherent risks and benefits of each have been discussed including the risks of infection, bleeding, injury to nerves/blood vessels/tendons, incomplete relief of symptoms, persisting pain and/or stiffness, loss of function, complex regional pain syndrome, failure of the procedure, as appropriate.  Teeth: Upper and lower dentures.  Scribe Attestation: I, Dawn Royse, am acting as scribe for [employer], MD    Electronically signed by Lauris Poag, MD at 07/27/2019 6:59 PM EDT   Reviewed paper H+P, will be scanned into chart. No changes noted.

## 2019-07-31 NOTE — Op Note (Signed)
Date July 31, 2019  time 1:14 PM   PATIENT:  Andres Hebert   PRE-OPERATIVE DIAGNOSIS:  closed wedge compression fracture of L1   POST-OPERATIVE DIAGNOSIS:  closed wedge compression fracture of L1   PROCEDURE:  Procedure(s): KYPHOPLASTY L1  SURGEON: Laurene Footman, MD   ASSISTANTS: None   ANESTHESIA:   local and MAC   EBL:  No intake/output data recorded.   BLOOD ADMINISTERED:none   DRAINS: none    LOCAL MEDICATIONS USED:  MARCAINE    and XYLOCAINE    SPECIMEN:   None   DISPOSITION OF SPECIMEN:  Not applicable   COUNTS:  YES   TOURNIQUET:  * No tourniquets in log *   IMPLANTS: Bone cement   DICTATION: .Dragon Dictation  patient was brought to the operating room and after adequate anesthesia was obtained the patient was placed prone.  C arm was brought in in good visualization of the affected level obtained on both AP and lateral projections.  After patient identification and timeout procedures were completed, local anesthetic was infiltrated with 10 cc 1% Xylocaine infiltrated subcutaneously.  This is done the area on the each side of the planned approach.  The back was then prepped and draped in the usual sterile manner and repeat timeout procedure carried out.  A spinal needle was brought down to the pedicle on the each side of  L1 and a 50-50 mix of 1% Xylocaine half percent Sensorcaine with epinephrine total of 20 cc injected on each side.  After allowing this to set a small incision was made and the trocar was advanced into the vertebral body in an extrapedicular fashion.  Biopsy was not obtained despite attempt on both sides Drilling was carried out balloon inserted with inflation to  5 cc on the right and 5 cc on the left.  When the cement was appropriate consistency 4-1/2 cc were injected on the right and 4-1/2 cc on the left into the vertebral body without extravasation, good fill superior to inferior endplates and from right to left sides along the inferior endplate.   After the cement had set the trochar was removed and permanent C-arm views obtained.  The wound was closed with Dermabond followed by [place]:  Discharged home after recovery room   PATIENT DISPOSITION:  PACU - hemodynamically stable.

## 2019-08-01 ENCOUNTER — Inpatient Hospital Stay: Payer: Medicare HMO

## 2019-08-01 ENCOUNTER — Inpatient Hospital Stay: Payer: Medicare HMO | Admitting: Hematology and Oncology

## 2019-08-01 ENCOUNTER — Encounter: Payer: Self-pay | Admitting: Orthopedic Surgery

## 2019-08-01 NOTE — Anesthesia Postprocedure Evaluation (Signed)
Anesthesia Post Note  Patient: Andres Hebert  Procedure(s) Performed: L1 KYPHOPLASTY (N/A Spine Lumbar)  Patient location during evaluation: PACU Anesthesia Type: General Level of consciousness: awake and alert Pain management: pain level controlled Vital Signs Assessment: post-procedure vital signs reviewed and stable Respiratory status: spontaneous breathing and respiratory function stable Cardiovascular status: stable Anesthetic complications: no   No complications documented.   Last Vitals:  Vitals:   07/31/19 1352 07/31/19 1437  BP: 103/60 (!) 117/59  Pulse: 84 80  Resp: 11 12  Temp:  36.6 C  SpO2: 100% 92%    Last Pain:  Vitals:   07/31/19 1437  TempSrc: Temporal  PainSc: 1                  Michon Kaczmarek K

## 2019-08-07 ENCOUNTER — Inpatient Hospital Stay (HOSPITAL_BASED_OUTPATIENT_CLINIC_OR_DEPARTMENT_OTHER): Payer: Medicare HMO | Admitting: Hospice and Palliative Medicine

## 2019-08-07 ENCOUNTER — Encounter: Payer: Self-pay | Admitting: Hospice and Palliative Medicine

## 2019-08-07 DIAGNOSIS — C3431 Malignant neoplasm of lower lobe, right bronchus or lung: Secondary | ICD-10-CM | POA: Diagnosis not present

## 2019-08-07 DIAGNOSIS — Z515 Encounter for palliative care: Secondary | ICD-10-CM

## 2019-08-07 NOTE — Progress Notes (Signed)
Virtual Visit via Telephone Note  I connected with Andres Hebert on 08/07/19 at  1:00 PM EDT by telephone and verified that I am speaking with the correct person using two identifiers.   I discussed the limitations, risks, security and privacy concerns of performing an evaluation and management service by telephone and the availability of in person appointments. I also discussed with the patient that there may be a patient responsible charge related to this service. The patient expressed understanding and agreed to proceed.   History of Present Illness: Mr. Andres Hebert is an 86 y.o. male with multiple medical problems including mild cognitive impairment and stage IV adenocarcinoma of the lung with h/o malignant pleural effusion on treatment with Keytruda.  Palliative care was consulted to help address goals and manage ongoing symptoms.   Observations/Objective: Virtual visit attempted.  I spoke with the patient's wife by phone.    Wife says that patient's overall decline.  He is mostly in the bed now.  She feels he is symptomatically well controlled.  Patient did undergo kyphoplasty on 07/31/2019.  Pain has been somewhat improved following this procedure.  Discussed continued use of oxycodone as needed.  Hospice is following in the home.  Wife feels that that is going well.  Assessment and Plan: Stage IV non-small cell lung cancer -best supportive care.  Followed by hospice  Follow Up Instructions: Follow-up virtual visit as needed   I discussed the assessment and treatment plan with the patient. The patient was provided an opportunity to ask questions and all were answered. The patient agreed with the plan and demonstrated an understanding of the instructions.   The patient was advised to call back or seek an in-person evaluation if the symptoms worsen or if the condition fails to improve as anticipated.  I provided 5 minutes of non-face-to-face time during this encounter.   Irean Hong, NP

## 2019-08-07 NOTE — Progress Notes (Signed)
Patient wife stated that have contacted hospice.

## 2019-08-08 ENCOUNTER — Ambulatory Visit: Payer: Medicare HMO

## 2019-08-20 ENCOUNTER — Inpatient Hospital Stay: Payer: Medicare HMO

## 2019-10-27 ENCOUNTER — Other Ambulatory Visit: Payer: Self-pay | Admitting: Hematology and Oncology

## 2019-11-08 ENCOUNTER — Other Ambulatory Visit: Payer: Self-pay | Admitting: Hematology and Oncology

## 2019-11-15 ENCOUNTER — Other Ambulatory Visit: Payer: Self-pay | Admitting: Hematology and Oncology

## 2019-12-01 ENCOUNTER — Other Ambulatory Visit: Payer: Self-pay | Admitting: Hematology and Oncology

## 2019-12-04 ENCOUNTER — Other Ambulatory Visit: Payer: Self-pay | Admitting: *Deleted

## 2019-12-05 MED ORDER — CITALOPRAM HYDROBROMIDE 10 MG PO TABS: 10.0000 mg | ORAL_TABLET | Freq: Every day | ORAL | 0 refills | Status: DC

## 2019-12-12 ENCOUNTER — Other Ambulatory Visit: Payer: Self-pay | Admitting: Hematology and Oncology

## 2019-12-20 ENCOUNTER — Other Ambulatory Visit: Payer: Self-pay | Admitting: Hematology and Oncology

## 2019-12-27 ENCOUNTER — Other Ambulatory Visit: Payer: Self-pay | Admitting: Hematology and Oncology

## 2020-01-05 ENCOUNTER — Other Ambulatory Visit: Payer: Self-pay | Admitting: Hematology and Oncology

## 2020-01-14 ENCOUNTER — Other Ambulatory Visit: Payer: Self-pay | Admitting: Hematology and Oncology

## 2020-01-17 ENCOUNTER — Telehealth: Payer: Self-pay

## 2020-01-17 ENCOUNTER — Other Ambulatory Visit: Payer: Self-pay | Admitting: *Deleted

## 2020-01-17 NOTE — Telephone Encounter (Incomplete)
I have tried to call all the numbers in this patient chart. I was unsuccessful gettting in touch with anyone. There was a message which was received for a refill for Ativian.

## 2020-01-20 ENCOUNTER — Other Ambulatory Visit: Payer: Self-pay | Admitting: Hematology and Oncology

## 2020-01-22 ENCOUNTER — Telehealth: Payer: Self-pay | Admitting: *Deleted

## 2020-01-22 ENCOUNTER — Other Ambulatory Visit: Payer: Self-pay | Admitting: Hospice and Palliative Medicine

## 2020-01-22 MED ORDER — MORPHINE SULFATE (CONCENTRATE) 10 MG /0.5 ML PO SOLN: 5.0000 mg | ORAL | 0 refills | Status: AC | PRN

## 2020-01-22 NOTE — Progress Notes (Signed)
Hospice requested Rx for morphine elixir. Will send to pharmacy.

## 2020-01-22 NOTE — Telephone Encounter (Signed)
Andres Hebert with Hospice called requesting order for Roxanol 0.25 mg every 2 hours as needed shortness of breath or pain and to be notified when ordered, Prescription to be sent to [facility]

## 2020-01-22 NOTE — Telephone Encounter (Signed)
Rx sent 

## 2020-01-28 ENCOUNTER — Other Ambulatory Visit: Payer: Self-pay | Admitting: Internal Medicine

## 2020-01-31 ENCOUNTER — Other Ambulatory Visit: Payer: Self-pay | Admitting: Hematology and Oncology

## 2020-02-12 ENCOUNTER — Other Ambulatory Visit: Payer: Self-pay | Admitting: Hematology and Oncology

## 2020-02-19 ENCOUNTER — Other Ambulatory Visit: Payer: Self-pay | Admitting: Hematology and Oncology

## 2020-02-20 ENCOUNTER — Other Ambulatory Visit: Payer: Self-pay | Admitting: Internal Medicine

## 2020-03-03 ENCOUNTER — Other Ambulatory Visit: Payer: Self-pay | Admitting: Hematology and Oncology

## 2020-03-20 ENCOUNTER — Other Ambulatory Visit: Payer: Self-pay | Admitting: Internal Medicine

## 2020-03-20 ENCOUNTER — Other Ambulatory Visit: Payer: Self-pay | Admitting: *Deleted

## 2020-03-20 DIAGNOSIS — C7951 Secondary malignant neoplasm of bone: Secondary | ICD-10-CM

## 2020-03-20 DIAGNOSIS — M545 Low back pain, unspecified: Secondary | ICD-10-CM

## 2020-03-20 DIAGNOSIS — M25559 Pain in unspecified hip: Secondary | ICD-10-CM

## 2020-03-20 DIAGNOSIS — G893 Neoplasm related pain (acute) (chronic): Secondary | ICD-10-CM

## 2020-03-20 MED ORDER — OXYCODONE HCL 5 MG PO TABS: 5.0000 mg | ORAL_TABLET | ORAL | 0 refills | Status: DC | PRN

## 2020-03-28 ENCOUNTER — Other Ambulatory Visit: Payer: Self-pay | Admitting: Hematology and Oncology

## 2020-04-02 ENCOUNTER — Other Ambulatory Visit: Payer: Self-pay | Admitting: Hematology and Oncology

## 2020-04-25 ENCOUNTER — Other Ambulatory Visit: Payer: Self-pay | Admitting: Hematology and Oncology

## 2020-05-07 ENCOUNTER — Other Ambulatory Visit: Payer: Self-pay | Admitting: Internal Medicine

## 2020-05-07 ENCOUNTER — Other Ambulatory Visit: Payer: Self-pay | Admitting: Hematology and Oncology

## 2020-05-16 ENCOUNTER — Telehealth: Payer: Self-pay | Admitting: *Deleted

## 2020-05-16 ENCOUNTER — Other Ambulatory Visit: Payer: Self-pay | Admitting: Oncology

## 2020-05-16 DIAGNOSIS — M25559 Pain in unspecified hip: Secondary | ICD-10-CM

## 2020-05-16 DIAGNOSIS — M545 Low back pain, unspecified: Secondary | ICD-10-CM

## 2020-05-16 DIAGNOSIS — C7951 Secondary malignant neoplasm of bone: Secondary | ICD-10-CM

## 2020-05-16 DIAGNOSIS — G893 Neoplasm related pain (acute) (chronic): Secondary | ICD-10-CM

## 2020-05-16 MED ORDER — OXYCODONE HCL 5 MG PO TABS: 5.0000 mg | ORAL_TABLET | ORAL | 0 refills | Status: DC | PRN

## 2020-05-16 NOTE — Telephone Encounter (Signed)
Received triage voicemail from [facility] with [facility], requesting a refill on his Oxycodone to be sent to [facility] on [address]. [phone].

## 2020-06-06 ENCOUNTER — Other Ambulatory Visit: Payer: Self-pay | Admitting: *Deleted

## 2020-06-06 DIAGNOSIS — M25559 Pain in unspecified hip: Secondary | ICD-10-CM

## 2020-06-06 DIAGNOSIS — C7951 Secondary malignant neoplasm of bone: Secondary | ICD-10-CM

## 2020-06-06 DIAGNOSIS — M545 Low back pain, unspecified: Secondary | ICD-10-CM

## 2020-06-06 DIAGNOSIS — G893 Neoplasm related pain (acute) (chronic): Secondary | ICD-10-CM

## 2020-06-06 MED ORDER — OXYCODONE HCL 5 MG PO TABS: 5.0000 mg | ORAL_TABLET | ORAL | 0 refills | Status: AC | PRN

## 2020-07-25 DEATH — deceased

## 2020-09-11 IMAGING — CT CT BIOPSY
2 of 4 series · 5 of 14 positions shown, 6 images · non-contrast
Comparison: none

INDICATION: Right lower lobe lung mass tissue is needed for genetic testing for
therapy planning

[Series 3: i-spiral 5.0 b30f · axial · 0.57mm/px · z∈[-941,-871]mm · 2 of 43 slices shown, 3 images]
[im 15/43  soft-tissue]
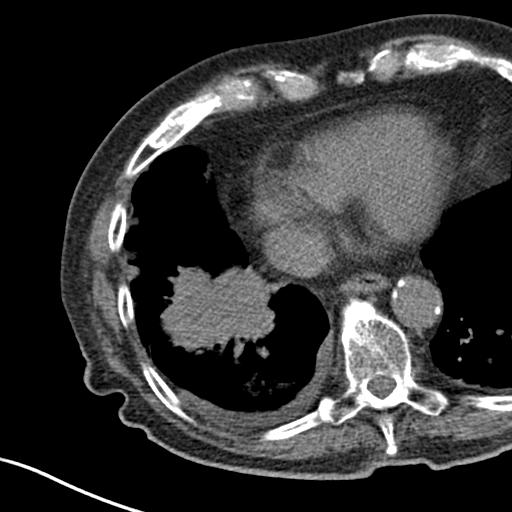
[im 15/43  bone]
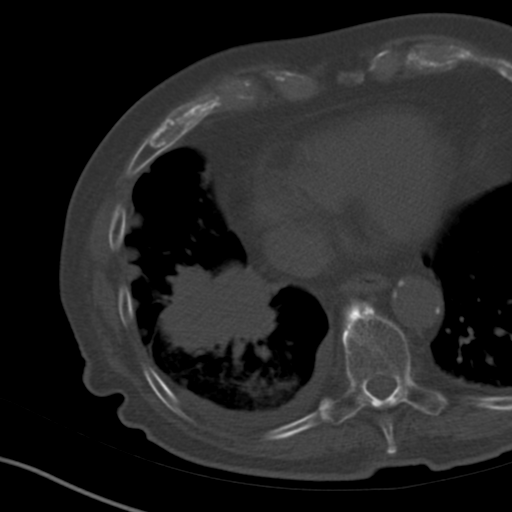
[im 29/43  bone]
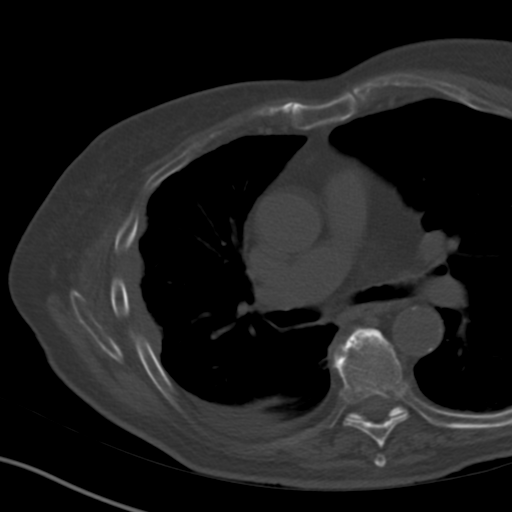

[Series 4: i-sequence 2.4 b30s · axial · 0.61mm/px · z∈[-957,-950]mm · 3 of 54 slices shown]
[im 14/54  bone]
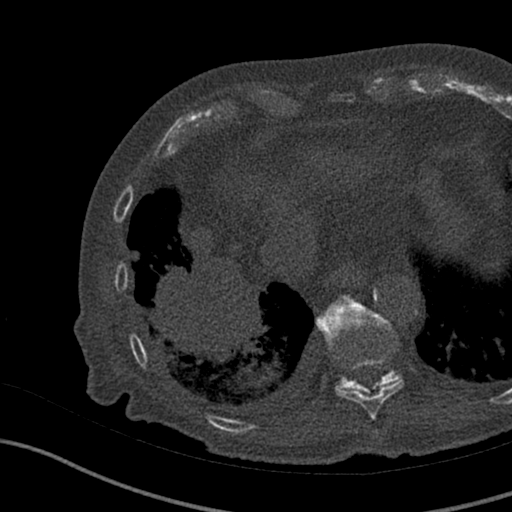
[im 27/54  bone]
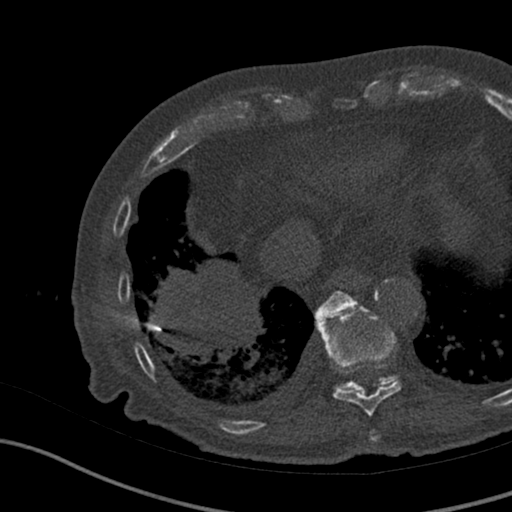
[im 40/54  bone]
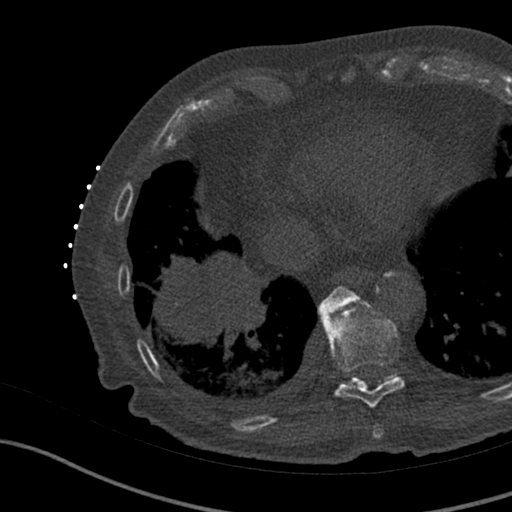

[5 of 14 positions shown; findings below may reference images not displayed]

EXAM:
CT-GUIDED BIOPSY OF A RIGHT LOWER LOBE LUNG MASS

MEDICATIONS:
None.

ANESTHESIA/SEDATION:
Moderate (conscious) sedation was employed during this procedure. A
total of Versed 1 mg and Fentanyl 25 mcg was administered
intravenously.

Moderate Sedation Time: 24 minutes. The patient's level of
consciousness and vital signs were monitored continuously by
radiology nursing throughout the procedure under my direct
supervision.

FLUOROSCOPY TIME:  Not applicable

COMPLICATIONS:
None immediate.

PROCEDURE:
Informed written consent was obtained from the patient after a
thorough discussion of the procedural risks, benefits and
alternatives. All questions were addressed. Maximal Sterile Barrier
Technique was utilized including caps, mask, sterile gowns, sterile
gloves, sterile drape, hand hygiene and skin antiseptic. A timeout
was performed prior to the initiation of the procedure.

Utilizing 1% xylocaine as local anesthetic and CT fluoroscopic
guidance a 17 gauge guiding needle was placed percutaneously into
the right lower lobe within the known right lower lobe
adenocarcinoma. The needle was placed in an area demonstrating PET
avidity previously to allow for maximal viable tissue. Multiple 18
gauge core biopsies were then obtained without difficulty. The
guiding needle was then removed and the tract embolized with Gelfoam
slurry to aid in prevention of pneumothorax. Follow-up scanning
shows no pneumothorax. The patient tolerated the procedure well and
was returned his room in satisfactory condition.
IMPRESSION: Successful CT-guided biopsy of a right lower lobe lung mass. The
lung mass was chosen as the pleural lesions were not readily
accessible.

## 2021-01-01 IMAGING — XA DG C-ARM 1-60 MIN
1 series · 1 of 1 positions shown · non-contrast
Comparison: Lumbar MRI 02/15/2019

CLINICAL DATA: Kyphoplasty

EXAM:
LUMBAR SPINE - 2-3 VIEW; DG C-ARM 1-60 MIN

[Series 3: cont. · 1 of 1 slices shown]
[im 1/1]
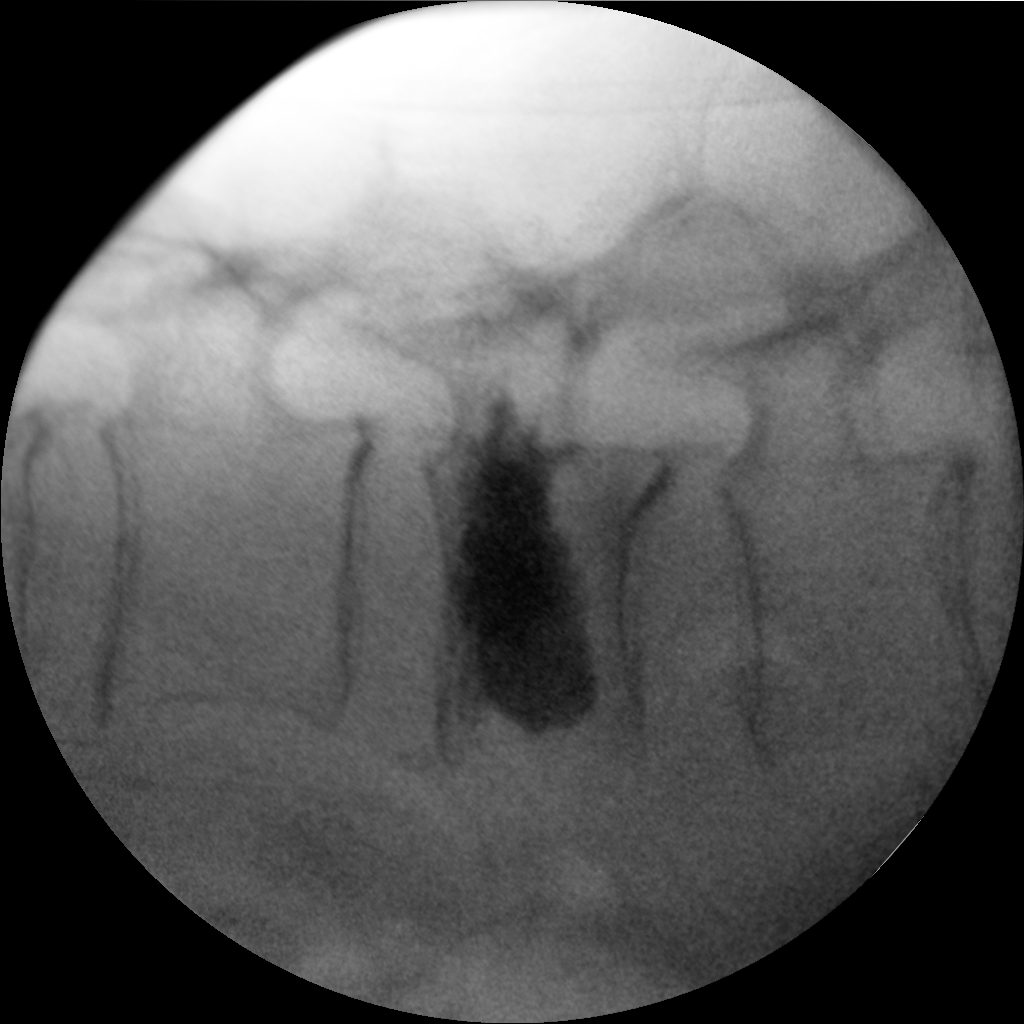

[1 of 1 positions shown; findings below may reference images not displayed]

FINDINGS: Single C-arm image was obtained of the lateral lumbar spine.
Compression fracture lumbar spine in treated with kyphoplasty.
Limited field-of-view does not allow accurate level assignment.
Lumbar fracture L3 was noted on prior MRI. Satisfactory cement
distribution
IMPRESSION: Kyphoplasty lumbar spine.

## 2021-06-11 IMAGING — RF DG C-ARM 1-60 MIN
1 series · 1 of 1 positions shown · non-contrast
Comparison: 07/24/2019

CLINICAL DATA: L1 kyphoplasty

EXAM:
LUMBAR SPINE - 2-3 VIEW; DG C-ARM 1-60 MIN

[Series 1: dg x-ray · 0.20mm/px · 1 of 1 slices shown]
[im 1/1]
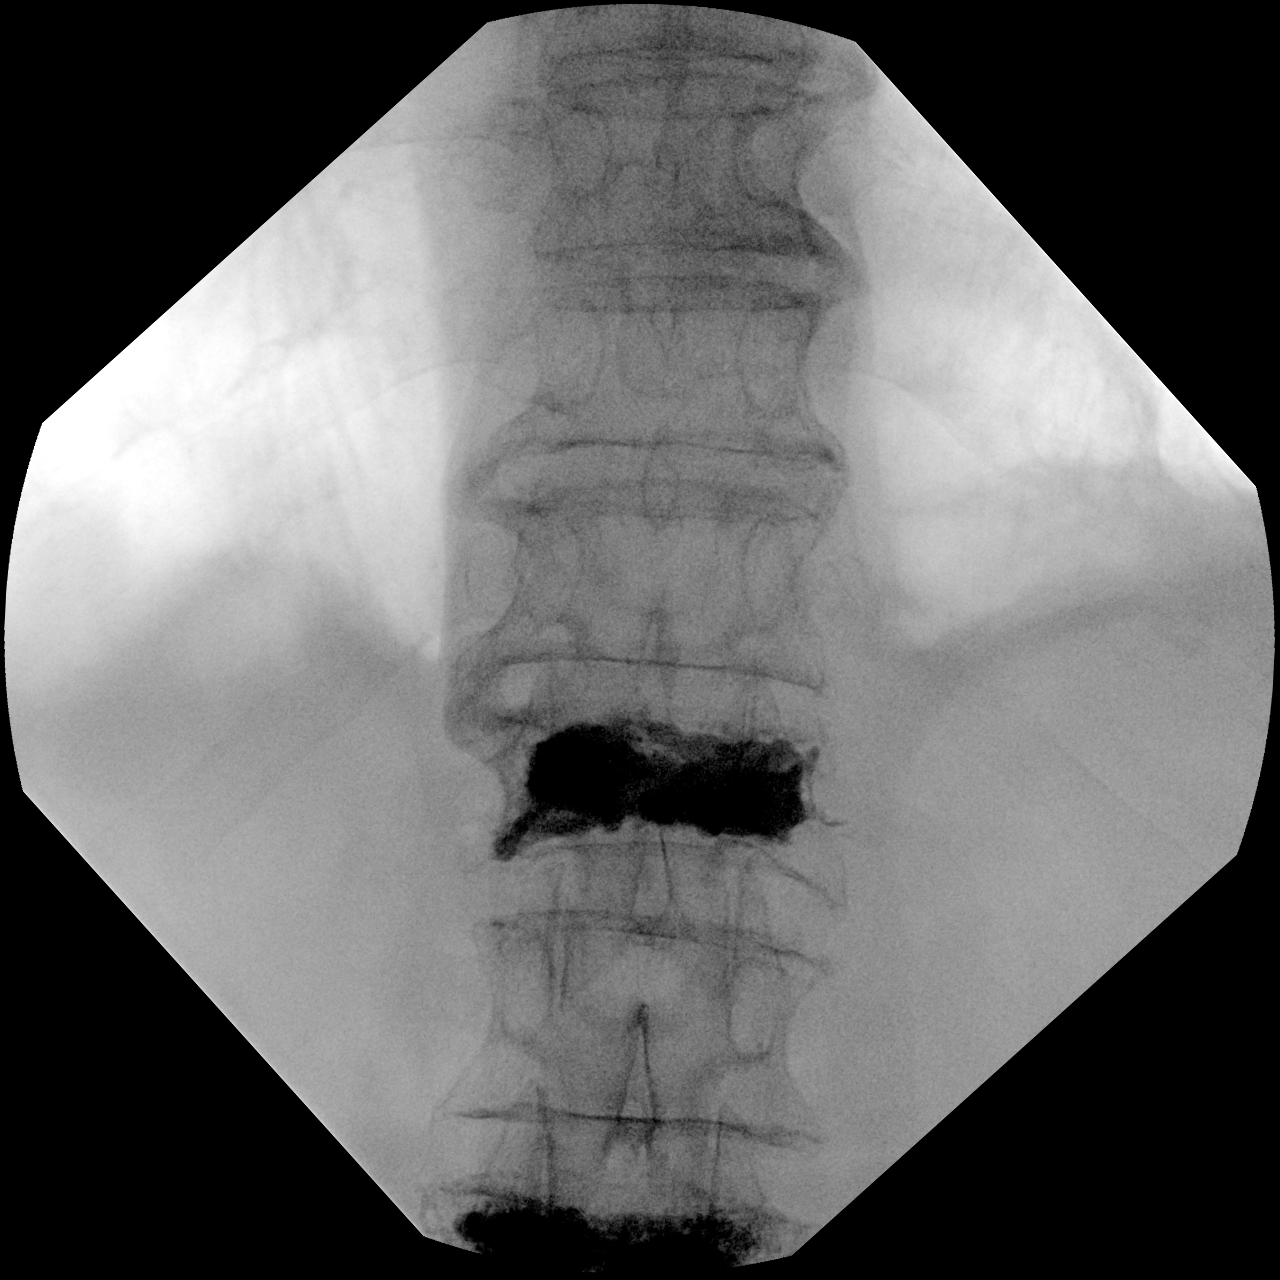

[1 of 1 positions shown; findings below may reference images not displayed]

FINDINGS: 2 C-arm fluoroscopic images were obtained intraoperatively and
submitted for post operative interpretation. Frontal and lateral
views of the thoracolumbar junction demonstrate interval cement
augmentation of the L1 vertebral body which appears well
distributed. Partially visualized prior cement augmentation of L3. 1
minutes 43 seconds of fluoroscopy time was utilized. Please see the
performing provider's procedural report for further detail.
IMPRESSION: As above.
